# Patient Record
Sex: Female | Born: 1975
Health system: Southern US, Community
[De-identification: ages and names within clinical notes are randomized; demographics above are authoritative.]

## PROBLEM LIST (undated history)

## (undated) DIAGNOSIS — I499 Cardiac arrhythmia, unspecified: Secondary | ICD-10-CM

## (undated) DIAGNOSIS — R51 Headache: Secondary | ICD-10-CM

## (undated) DIAGNOSIS — Z9889 Other specified postprocedural states: Secondary | ICD-10-CM

## (undated) DIAGNOSIS — I10 Essential (primary) hypertension: Secondary | ICD-10-CM

## (undated) DIAGNOSIS — M797 Fibromyalgia: Secondary | ICD-10-CM

## (undated) DIAGNOSIS — J189 Pneumonia, unspecified organism: Secondary | ICD-10-CM

## (undated) DIAGNOSIS — Z8489 Family history of other specified conditions: Secondary | ICD-10-CM

## (undated) DIAGNOSIS — F32A Depression, unspecified: Secondary | ICD-10-CM

## (undated) DIAGNOSIS — G4700 Insomnia, unspecified: Secondary | ICD-10-CM

## (undated) DIAGNOSIS — F419 Anxiety disorder, unspecified: Secondary | ICD-10-CM

## (undated) DIAGNOSIS — K219 Gastro-esophageal reflux disease without esophagitis: Secondary | ICD-10-CM

## (undated) DIAGNOSIS — IMO0001 Reserved for inherently not codable concepts without codable children: Secondary | ICD-10-CM

## (undated) DIAGNOSIS — K589 Irritable bowel syndrome without diarrhea: Secondary | ICD-10-CM

## (undated) DIAGNOSIS — M542 Cervicalgia: Secondary | ICD-10-CM

## (undated) DIAGNOSIS — M7918 Myalgia, other site: Secondary | ICD-10-CM

## (undated) DIAGNOSIS — N83209 Unspecified ovarian cyst, unspecified side: Secondary | ICD-10-CM

## (undated) DIAGNOSIS — T4145XA Adverse effect of unspecified anesthetic, initial encounter: Secondary | ICD-10-CM

## (undated) DIAGNOSIS — J45909 Unspecified asthma, uncomplicated: Secondary | ICD-10-CM

## (undated) DIAGNOSIS — S83241A Other tear of medial meniscus, current injury, right knee, initial encounter: Secondary | ICD-10-CM

## (undated) DIAGNOSIS — N3010 Interstitial cystitis (chronic) without hematuria: Secondary | ICD-10-CM

## (undated) DIAGNOSIS — T8859XA Other complications of anesthesia, initial encounter: Secondary | ICD-10-CM

## (undated) DIAGNOSIS — R112 Nausea with vomiting, unspecified: Secondary | ICD-10-CM

## (undated) DIAGNOSIS — M1990 Unspecified osteoarthritis, unspecified site: Secondary | ICD-10-CM

## (undated) HISTORY — DX: Irritable bowel syndrome without diarrhea: K58.9

## (undated) HISTORY — PX: OVARIAN CYST REMOVAL: SHX89

## (undated) HISTORY — PX: OTHER SURGICAL HISTORY: SHX169

## (undated) HISTORY — DX: Headache: R51

## (undated) HISTORY — DX: Anxiety disorder, unspecified: F41.9

---

## 2001-05-02 HISTORY — PX: PELVIC LAPAROSCOPY: SHX162

## 2002-03-26 ENCOUNTER — Other Ambulatory Visit: Admission: RE | Admit: 2002-03-26 | Discharge: 2002-03-26 | Payer: Self-pay | Admitting: Obstetrics & Gynecology

## 2002-06-10 ENCOUNTER — Ambulatory Visit (HOSPITAL_COMMUNITY): Admission: RE | Admit: 2002-06-10 | Discharge: 2002-06-10 | Payer: Self-pay | Admitting: Obstetrics and Gynecology

## 2002-06-11 ENCOUNTER — Encounter: Payer: Self-pay | Admitting: Obstetrics and Gynecology

## 2002-06-11 ENCOUNTER — Encounter: Admission: RE | Admit: 2002-06-11 | Discharge: 2002-06-11 | Payer: Self-pay | Admitting: Obstetrics and Gynecology

## 2002-06-11 ENCOUNTER — Observation Stay (HOSPITAL_COMMUNITY): Admission: AD | Admit: 2002-06-11 | Discharge: 2002-06-12 | Payer: Self-pay | Admitting: Obstetrics & Gynecology

## 2002-06-12 ENCOUNTER — Encounter: Payer: Self-pay | Admitting: Obstetrics and Gynecology

## 2005-02-18 ENCOUNTER — Inpatient Hospital Stay (HOSPITAL_COMMUNITY): Admission: RE | Admit: 2005-02-18 | Discharge: 2005-02-21 | Payer: Self-pay | Admitting: Obstetrics and Gynecology

## 2006-03-20 ENCOUNTER — Ambulatory Visit: Payer: Self-pay | Admitting: Family Medicine

## 2006-05-08 ENCOUNTER — Ambulatory Visit: Payer: Self-pay | Admitting: Family Medicine

## 2006-05-10 ENCOUNTER — Encounter: Admission: RE | Admit: 2006-05-10 | Discharge: 2006-05-10 | Payer: Self-pay | Admitting: Family Medicine

## 2006-06-05 ENCOUNTER — Ambulatory Visit: Payer: Self-pay | Admitting: Family Medicine

## 2006-06-28 ENCOUNTER — Ambulatory Visit: Payer: Self-pay | Admitting: Family Medicine

## 2006-08-09 ENCOUNTER — Ambulatory Visit: Payer: Self-pay | Admitting: Family Medicine

## 2006-12-18 ENCOUNTER — Encounter (INDEPENDENT_AMBULATORY_CARE_PROVIDER_SITE_OTHER): Payer: Self-pay | Admitting: Family Medicine

## 2006-12-22 ENCOUNTER — Ambulatory Visit: Payer: Self-pay | Admitting: Family Medicine

## 2006-12-22 ENCOUNTER — Telehealth (INDEPENDENT_AMBULATORY_CARE_PROVIDER_SITE_OTHER): Payer: Self-pay | Admitting: *Deleted

## 2006-12-22 LAB — CONVERTED CEMR LAB
Hep B Core Total Ab: NEGATIVE
Hep B S Ab: POSITIVE — AB
Hepatitis B Surface Ag: NEGATIVE

## 2006-12-25 ENCOUNTER — Telehealth (INDEPENDENT_AMBULATORY_CARE_PROVIDER_SITE_OTHER): Payer: Self-pay | Admitting: *Deleted

## 2007-03-02 ENCOUNTER — Telehealth (INDEPENDENT_AMBULATORY_CARE_PROVIDER_SITE_OTHER): Payer: Self-pay | Admitting: *Deleted

## 2007-06-28 ENCOUNTER — Telehealth (INDEPENDENT_AMBULATORY_CARE_PROVIDER_SITE_OTHER): Payer: Self-pay | Admitting: *Deleted

## 2007-06-29 ENCOUNTER — Telehealth (INDEPENDENT_AMBULATORY_CARE_PROVIDER_SITE_OTHER): Payer: Self-pay | Admitting: *Deleted

## 2007-07-06 ENCOUNTER — Ambulatory Visit: Payer: Self-pay | Admitting: Family Medicine

## 2007-07-06 DIAGNOSIS — G4700 Insomnia, unspecified: Secondary | ICD-10-CM | POA: Insufficient documentation

## 2007-07-06 DIAGNOSIS — R51 Headache: Secondary | ICD-10-CM | POA: Insufficient documentation

## 2007-07-06 DIAGNOSIS — F5101 Primary insomnia: Secondary | ICD-10-CM | POA: Insufficient documentation

## 2007-07-06 DIAGNOSIS — R519 Headache, unspecified: Secondary | ICD-10-CM | POA: Insufficient documentation

## 2007-07-06 DIAGNOSIS — F4320 Adjustment disorder, unspecified: Secondary | ICD-10-CM | POA: Insufficient documentation

## 2007-07-06 DIAGNOSIS — F411 Generalized anxiety disorder: Secondary | ICD-10-CM | POA: Insufficient documentation

## 2007-07-30 ENCOUNTER — Telehealth (INDEPENDENT_AMBULATORY_CARE_PROVIDER_SITE_OTHER): Payer: Self-pay | Admitting: *Deleted

## 2007-09-11 ENCOUNTER — Encounter (INDEPENDENT_AMBULATORY_CARE_PROVIDER_SITE_OTHER): Payer: Self-pay | Admitting: *Deleted

## 2007-09-11 ENCOUNTER — Ambulatory Visit: Payer: Self-pay | Admitting: Internal Medicine

## 2007-10-09 ENCOUNTER — Telehealth (INDEPENDENT_AMBULATORY_CARE_PROVIDER_SITE_OTHER): Payer: Self-pay | Admitting: *Deleted

## 2007-10-18 ENCOUNTER — Telehealth (INDEPENDENT_AMBULATORY_CARE_PROVIDER_SITE_OTHER): Payer: Self-pay | Admitting: *Deleted

## 2007-10-26 ENCOUNTER — Ambulatory Visit: Payer: Self-pay | Admitting: Family Medicine

## 2007-12-19 ENCOUNTER — Ambulatory Visit: Payer: Self-pay | Admitting: Family Medicine

## 2008-01-29 ENCOUNTER — Telehealth (INDEPENDENT_AMBULATORY_CARE_PROVIDER_SITE_OTHER): Payer: Self-pay | Admitting: *Deleted

## 2008-02-15 ENCOUNTER — Ambulatory Visit: Payer: Self-pay | Admitting: Family Medicine

## 2008-02-27 ENCOUNTER — Telehealth: Payer: Self-pay | Admitting: Family Medicine

## 2008-03-05 ENCOUNTER — Ambulatory Visit: Payer: Self-pay | Admitting: Family Medicine

## 2008-03-10 ENCOUNTER — Telehealth: Payer: Self-pay | Admitting: Family Medicine

## 2008-03-24 ENCOUNTER — Telehealth: Payer: Self-pay | Admitting: Family Medicine

## 2008-04-28 ENCOUNTER — Telehealth: Payer: Self-pay | Admitting: Family Medicine

## 2008-05-20 ENCOUNTER — Telehealth (INDEPENDENT_AMBULATORY_CARE_PROVIDER_SITE_OTHER): Payer: Self-pay | Admitting: *Deleted

## 2008-06-13 ENCOUNTER — Telehealth (INDEPENDENT_AMBULATORY_CARE_PROVIDER_SITE_OTHER): Payer: Self-pay | Admitting: *Deleted

## 2008-06-26 ENCOUNTER — Ambulatory Visit: Payer: Self-pay | Admitting: Family Medicine

## 2008-06-26 DIAGNOSIS — F329 Major depressive disorder, single episode, unspecified: Secondary | ICD-10-CM

## 2008-06-26 DIAGNOSIS — F32A Depression, unspecified: Secondary | ICD-10-CM | POA: Insufficient documentation

## 2008-07-10 ENCOUNTER — Telehealth: Payer: Self-pay | Admitting: Family Medicine

## 2008-08-02 ENCOUNTER — Ambulatory Visit: Payer: Self-pay | Admitting: Family Medicine

## 2008-08-08 ENCOUNTER — Telehealth: Payer: Self-pay | Admitting: Family Medicine

## 2008-08-22 ENCOUNTER — Ambulatory Visit: Payer: Self-pay | Admitting: Family Medicine

## 2008-08-22 LAB — CONVERTED CEMR LAB
ALT: 14 units/L (ref 0–35)
AST: 15 units/L (ref 0–37)
Albumin: 4.8 g/dL (ref 3.5–5.2)
Alkaline Phosphatase: 46 units/L (ref 39–117)
BUN: 16 mg/dL (ref 6–23)
CO2: 23 meq/L (ref 19–32)
Calcium: 9.5 mg/dL (ref 8.4–10.5)
Chloride: 104 meq/L (ref 96–112)
Cholesterol: 138 mg/dL (ref 0–200)
Creatinine, Ser: 0.87 mg/dL (ref 0.40–1.20)
Glucose, Bld: 89 mg/dL (ref 70–99)
HDL: 53 mg/dL (ref >39)
LDL Cholesterol: 68 mg/dL (ref 0–99)
Potassium: 4.3 meq/L (ref 3.5–5.3)
Sodium: 139 meq/L (ref 135–145)
Total Bilirubin: 0.7 mg/dL (ref 0.3–1.2)
Total CHOL/HDL Ratio: 2.6
Total Protein: 7.5 g/dL (ref 6.0–8.3)
Triglycerides: 85 mg/dL (ref <150)
VLDL: 17 mg/dL (ref 0–40)

## 2008-08-25 ENCOUNTER — Encounter: Payer: Self-pay | Admitting: Family Medicine

## 2008-08-26 ENCOUNTER — Encounter: Payer: Self-pay | Admitting: Family Medicine

## 2008-10-06 ENCOUNTER — Telehealth: Payer: Self-pay | Admitting: Family Medicine

## 2008-12-26 ENCOUNTER — Ambulatory Visit: Payer: Self-pay | Admitting: Family Medicine

## 2009-01-13 ENCOUNTER — Telehealth: Payer: Self-pay | Admitting: Family Medicine

## 2009-01-15 ENCOUNTER — Telehealth (INDEPENDENT_AMBULATORY_CARE_PROVIDER_SITE_OTHER): Payer: Self-pay | Admitting: *Deleted

## 2009-01-21 ENCOUNTER — Telehealth (INDEPENDENT_AMBULATORY_CARE_PROVIDER_SITE_OTHER): Payer: Self-pay | Admitting: *Deleted

## 2009-01-23 ENCOUNTER — Telehealth: Payer: Self-pay | Admitting: Family Medicine

## 2009-01-28 ENCOUNTER — Ambulatory Visit: Payer: Self-pay | Admitting: Family Medicine

## 2009-01-28 LAB — CONVERTED CEMR LAB: Rapid Strep: NEGATIVE

## 2009-02-17 ENCOUNTER — Telehealth: Payer: Self-pay | Admitting: Family Medicine

## 2009-02-26 ENCOUNTER — Ambulatory Visit: Payer: Self-pay | Admitting: Family Medicine

## 2009-03-03 ENCOUNTER — Ambulatory Visit: Payer: Self-pay | Admitting: Family Medicine

## 2009-03-04 ENCOUNTER — Emergency Department (HOSPITAL_COMMUNITY): Admission: EM | Admit: 2009-03-04 | Discharge: 2009-03-04 | Payer: Self-pay | Admitting: Family Medicine

## 2009-03-20 ENCOUNTER — Ambulatory Visit: Payer: Self-pay | Admitting: Family Medicine

## 2009-04-15 ENCOUNTER — Ambulatory Visit: Payer: Self-pay | Admitting: Family Medicine

## 2009-04-23 ENCOUNTER — Telehealth: Payer: Self-pay | Admitting: Family Medicine

## 2009-05-25 ENCOUNTER — Telehealth: Payer: Self-pay | Admitting: Family Medicine

## 2009-07-02 ENCOUNTER — Ambulatory Visit: Payer: Self-pay | Admitting: Family Medicine

## 2009-07-02 DIAGNOSIS — R5381 Other malaise: Secondary | ICD-10-CM | POA: Insufficient documentation

## 2009-07-02 DIAGNOSIS — R5383 Other fatigue: Secondary | ICD-10-CM

## 2009-10-13 ENCOUNTER — Ambulatory Visit: Payer: Self-pay | Admitting: Family Medicine

## 2009-10-13 DIAGNOSIS — J309 Allergic rhinitis, unspecified: Secondary | ICD-10-CM | POA: Insufficient documentation

## 2009-10-30 ENCOUNTER — Telehealth: Payer: Self-pay | Admitting: Family Medicine

## 2009-11-13 ENCOUNTER — Ambulatory Visit (HOSPITAL_BASED_OUTPATIENT_CLINIC_OR_DEPARTMENT_OTHER): Admission: RE | Admit: 2009-11-13 | Discharge: 2009-11-13 | Payer: Self-pay | Admitting: Urology

## 2010-01-26 ENCOUNTER — Telehealth: Payer: Self-pay | Admitting: Family Medicine

## 2010-01-27 ENCOUNTER — Telehealth: Payer: Self-pay | Admitting: Family Medicine

## 2010-02-05 ENCOUNTER — Ambulatory Visit: Payer: Self-pay | Admitting: Family Medicine

## 2010-02-05 DIAGNOSIS — F17200 Nicotine dependence, unspecified, uncomplicated: Secondary | ICD-10-CM | POA: Insufficient documentation

## 2010-02-05 DIAGNOSIS — Z720 Tobacco use: Secondary | ICD-10-CM | POA: Insufficient documentation

## 2010-02-05 DIAGNOSIS — N3010 Interstitial cystitis (chronic) without hematuria: Secondary | ICD-10-CM | POA: Insufficient documentation

## 2010-02-17 ENCOUNTER — Telehealth: Payer: Self-pay | Admitting: Family Medicine

## 2010-02-17 ENCOUNTER — Encounter: Payer: Self-pay | Admitting: Family Medicine

## 2010-03-08 ENCOUNTER — Ambulatory Visit: Payer: Self-pay | Admitting: Family Medicine

## 2010-05-07 ENCOUNTER — Telehealth: Payer: Self-pay | Admitting: Family Medicine

## 2010-05-12 ENCOUNTER — Telehealth: Payer: Self-pay | Admitting: Family Medicine

## 2010-05-24 ENCOUNTER — Ambulatory Visit
Admission: RE | Admit: 2010-05-24 | Discharge: 2010-05-24 | Payer: Self-pay | Source: Home / Self Care | Attending: Family Medicine | Admitting: Family Medicine

## 2010-06-01 NOTE — Letter (Signed)
Summary: Out of Work  [facility]  [address], [address]   [place], [place] [address]   Phone: [phone]  Fax: [phone]    March 08, 2010   Employee:  [name]    To Whom It May Concern:   For Medical reasons, please excuse the above named employee from work for the following dates:  Start:   Nov 7th -8th  End:   Nov 9th  If you need additional information, please feel free to contact our office.         Sincerely,    Seymour Bars DO

## 2010-06-01 NOTE — Assessment & Plan Note (Signed)
Summary: f/u anxiety   Vital Signs:  Patient profile:   35 year old female Height:      63.5 inches Weight:      125 pounds BMI:     21.87 O2 Sat:      98 % on Room air Pulse rate:   86 / minute BP sitting:   105 / 71  (left arm) Cuff size:   regular  Vitals Entered By: Payton Spark CMA (February 05, 2010 1:04 PM)  O2 Flow:  Room air CC: F/U sleep   Primary Care Provider:  Seymour Bars DO  CC:  F/U sleep.  History of Present Illness: 35 yo WF presents for f/u sleep problems from her anxiety.  She lost 8 lbs.  She has been off daytime meds.    For the most part, she is doing OK.  She is going thru some stressors with her ex husband.  She wanted to RF her Chantix.  she quit smoking on the starting month pack 3 wks ago and is doing great.    Clonazepam is helping but she wants to increase to a higher dose.  Still having some problems getting to sleep.   Current Medications (verified): 1)  Maxalt-Mlt 5 Mg  Tbdp (Rizatriptan Benzoate) .... Take 1 Tablet  At Onset and Repeat in 2hrs If Needed. Max 2 Tablet/24hr 2)  Nasonex 50 Mcg/act Susp (Mometasone Furoate) .... 2 Sprays Each Nostril Once Daily 3)  Clonazepam 1 Mg Tabs (Clonazepam) .[name].. 1 Tab By Mouth At Bedtime As Needed Anxiety/ Sleep  Allergies (verified): No Known Drug Allergies  Past History:  Past Medical History: Reviewed history from 08/22/2008 and no changes required. Anxiety Headache IBS?  (per pt) saw Dr Loreta Ave  bursitis/ carpal tunnel - Dr Christell Constant, [facility]  pap - Dr Billy Coast, May  Social History: Reviewed history from 12/26/2008 and no changes required. 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   Head:  normocephalic and atraumatic.   Lungs:  Clear to auscultation bilaterally. Normal work of  breathing.  Heart:  RRR with normal S1 and S2. No murmur, rub, or gallop.  Skin:  color normal.   Psych:  good eye contact, not anxious appearing, and not depressed appearing.     Impression & Recommendations:  Problem # 1:  ANXIETY (ICD-300.00) Improved but will go up to the next higher dose (this is the max).  RX sent him.  Discussed adding SSRI or SNRI or counseling but she declined since she is only sometimes having symptoms.   Her updated medication list for this problem includes:    Clonazepam 2 Mg Tabs (Clonazepam) .[name]... 1 tab by mouth qhs  Problem # 2:  CIGARETTE SMOKER (ICD-305.1) Congratulated her on smoking cessation.  Did well on Chantix starting month pack.  Will increase her to the continuing month pack for months 2-->4.  Call if any problems.   Her updated medication list for this problem includes:    Chantix Continuing Month Pak 1 Mg Tabs (Varenicline tartrate) .[name]... Take as directed  Complete Medication List: 1)  Maxalt-mlt 5 Mg Tbdp (Rizatriptan benzoate) .... Take 1 tablet  at onset and repeat in 2hrs if needed. max 2 tablet/24hr 2)  Nasonex 50 Mcg/act Susp (Mometasone furoate) .... 2 sprays each nostril  once daily 3)  Clonazepam 2 Mg Tabs (Clonazepam) .[name].. 1 tab by mouth qhs 4)  Chantix Continuing Month Pak 1 Mg Tabs (Varenicline tartrate) .... Take as directed 5)  Elmiron 100 Mg Caps (Pentosan polysulfate sodium) .... 2 tabs by mouth bid  Patient Instructions: 1)  Increase Klonopin to 2 mg at bedtime. 2)  Call if daytime anxiety worsens. 3)  Chantix continuing month pack filled. 4)  Keep up the good work with smoking cessation! 5)  Return for f/u mood/ migraines in 4 months. Prescriptions: CLONAZEPAM 2 MG TABS (CLONAZEPAM) 1 tab by mouth qhs  #90 x 0   Entered and Authorized by:   Seymour Bars DO   Signed by:   Seymour Bars DO on 02/05/2010   Method used:   Printed then faxed to ...       [facility] (mail-order)             , [place]         Ph: [phone]        Fax: [phone]   RxID:   [id number] CHANTIX CONTINUING MONTH PAK 1 MG TABS (VARENICLINE TARTRATE) take as directed  #1 pack x 2   Entered and Authorized by:   Seymour Bars DO   Signed by:   Seymour Bars DO on 02/05/2010   Method used:   [facility] to        [facility] [phone]* (retail)       [address] [place], [place]  [address]       Ph: [phone] or [phone]       Fax: [phone]   RxID:   [id number] CLONAZEPAM 2 MG TABS (CLONAZEPAM) 1 tab by mouth qhs  #30 x 0   Entered and Authorized by:   Seymour Bars DO   Signed by:   Seymour Bars DO on 02/05/2010   Method used:   Printed then faxed to ...       [facility]  [place] [phone]* (retail)       [address] [place], [place]  [address]       Ph: [phone] or [phone]       Fax: [phone]   RxID:   [id number]

## 2010-06-01 NOTE — Assessment & Plan Note (Signed)
Summary: sinusitis   Vital Signs:  Patient profile:   35 year old female Height:      63.5 inches Weight:      127 pounds BMI:     22.22 O2 Sat:      97 % on Room air Temp:     97.9 degrees F oral Pulse rate:   88 / minute BP sitting:   108 / 71  (left arm) Cuff size:   regular  Vitals Entered By: Payton Spark CMA (March 08, 2010 2:46 PM)  O2 Flow:  Room air CC: ? Sinus infection x 3 days.   Primary Care Provider:  Seymour Bars DO  CC:  ? Sinus infection x 3 days.[name]  History of Present Illness: 35 yo WF presents for headache, head congestion, runny nose and scratchy throat.  Multiple sick contacts.  Having fatigue and subjective fevers.  Missing work today.  Taking Nyquil at night.  Using Motirn for the headache.  Has frontal sinus pressure.  Denies cough, chest tightness or SOB.    Current Medications (verified): 1)  Maxalt-Mlt 5 Mg  Tbdp (Rizatriptan Benzoate) .... Take 1 Tablet  At Onset and Repeat in 2hrs If Needed. Max 2 Tablet/24hr 2)  Nasonex 50 Mcg/act Susp (Mometasone Furoate) .... 2 Sprays Each Nostril Once Daily 3)  Clonazepam 2 Mg Tabs (Clonazepam) .[name].. 1 Tab By Mouth Qhs 4)  Chantix Continuing Month Pak 1 Mg Tabs (Varenicline Tartrate) .... Take As Directed 5)  Elmiron 100 Mg Caps (Pentosan Polysulfate Sodium) .... 2 Tabs By Mouth Bid 6)  Prochlorperazine Maleate 5 Mg Tabs (Prochlorperazine Maleate) .[name].. 1 Tab By Mouth Q 6 Hrs As Needed Nausea  Allergies (verified): No Known Drug Allergies  Past History:  Past Medical History: Reviewed history from 08/22/2008 and no changes required. Anxiety Headache IBS?  (per pt) saw Dr Loreta Ave  bursitis/ carpal tunnel - Dr Christell Constant, [facility]  pap - Dr Billy Coast, May  Social History: Reviewed history from 12/26/2008 and no changes required. 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   Head:  normocephalic and atraumatic.  frontal sinuses TTP with slight edema over maxillary region Eyes:  conjunctiva clear Ears:  EACs patent; TMs translucent and gray with good cone of light and bony landmarks.  Nose:  nasal congestion with boggy turbinates Mouth:  o/p injected Neck:  enlarged tender submandibular LA Lungs:  Normal respiratory effort, chest expands symmetrically. Lungs are clear to auscultation, no crackles or wheezes. Heart:  Normal rate and regular rhythm. S1 and S2 normal without gallop, murmur, click, rub or other extra sounds. Skin:  color normal.     Impression & Recommendations:  Problem # 1:  ACUTE FRONTAL SINUSITIS (ICD-461.1) Treat with Cefdinir x 10 days, Advil Cold and sinus day time and nyquil at night with use of Nasonex sample x 10 days. Call if not improved after 10 days. Her updated medication list for this problem includes:    Nasonex 50 Mcg/act Susp (Mometasone furoate) .[name]... 2 sprays each nostril once daily    Cefdinir 300 Mg Caps (Cefdinir) .[name]... 1 capsule by mouth two times a day x 10 days    Nasonex 50 Mcg/act Susp (Mometasone furoate) .[name]... 2 sprays per nostril daily  Complete Medication List: 1)  Maxalt-mlt 5 Mg Tbdp (  Rizatriptan benzoate) .... Take 1 tablet  at onset and repeat in 2hrs if needed. max 2 tablet/24hr 2)  Nasonex 50 Mcg/act Susp (Mometasone furoate) .... 2 sprays each nostril once daily 3)  Clonazepam 2 Mg Tabs (Clonazepam) .[name].. 1 tab by mouth qhs 4)  Chantix Continuing Month Pak 1 Mg Tabs (Varenicline tartrate) .... Take as directed 5)  Elmiron 100 Mg Caps (Pentosan polysulfate sodium) .... 2 tabs by mouth bid 6)  Prochlorperazine Maleate 5 Mg Tabs (Prochlorperazine maleate) .[name].. 1 tab by mouth q 6 hrs as needed nausea 7)  Cefdinir 300 Mg Caps (Cefdinir) .[name].. 1 capsule by mouth two times a day x 10 days 8)  Nasonex 50 Mcg/act Susp (Mometasone furoate) .... 2 sprays  per nostril daily  Patient Instructions: 1)  Take 10 days of Omnicef for sinusitis. 2)  Use Advil cold and sinus during the day and Nyquil at night. 3)  Use Nasonex 2 sprays per nostril daily x 10 days (sample given). 4)  Call if not resolved in 10 days. Prescriptions: CEFDINIR 300 MG CAPS (CEFDINIR) 1 capsule by mouth two times a day x 10 days  #20 x 0   Entered and Authorized by:   Seymour Bars DO   Signed by:   Seymour Bars DO on 03/08/2010   Method used:   Electronically to        [facility] [phone]* (retail)       [address] [place], [place]  [address]       Ph: [phone] or [phone]       Fax: [phone]   RxID:   [id number]    Orders Added: 1)  Est. Patient Level III [79024]

## 2010-06-01 NOTE — Progress Notes (Signed)
Summary: Trazodone not helping  Phone Note Call from Patient   Caller: Patient Summary of Call: Pt tried trazodone for a few nights but it is not helping. Pt would like to go back on the seroquel but it is too expensive. Pt would like to know know if plain seroquel comes generic. If so, Pt would like to go on plain seroquel. Please advise.  Initial call taken by: Payton Spark CMA,  May 25, 2009 10:14 AM  Follow-up for Phone Call        seroquel is not generic.  She has room to go up to 150 mg of Trazodone if she wants to try this. Follow-up by: Seymour Bars DO,  May 25, 2009 10:45 AM  Additional Follow-up for Phone Call Additional follow up Details #1::        Pt would like to go back on the seroquel and needs [facility] Additional Follow-up by: Payton Spark CMA,  May 25, 2009 12:08 PM    New/Updated Medications: SEROQUEL 50 MG TABS (QUETIAPINE FUMARATE) 1 tab by mouth q PM Prescriptions: SEROQUEL 50 MG TABS (QUETIAPINE FUMARATE) 1 tab by mouth q PM  #30 x 2   Entered and Authorized by:   Seymour Bars DO   Signed by:   Seymour Bars DO on 05/25/2009   Method used:   Electronically to        [facility] [phone]* (retail)       [address] [place], [place]  [address]       Ph: [phone] or [phone]       Fax: [phone]   RxID:   [id number]   Appended Document: Trazodone not helping Pt aware

## 2010-06-01 NOTE — Assessment & Plan Note (Signed)
Summary: cold-cough///vgj   Vital Signs:  Patient profile:   35 year old female Height:      63.5 inches Weight:      133 pounds BMI:     23.27 O2 Sat:      98 % on Room air Temp:     98.0 degrees F oral Pulse rate:   87 / minute BP sitting:   111 / 76  (left arm) Cuff size:   regular  Vitals Entered By: Payton Spark CMA (October 13, 2009 11:00 AM)  O2 Flow:  Room air CC: Congestion, ST amd HA x 1 week.    Primary Care Provider:  Seymour Bars DO  CC:  Congestion and ST amd HA x 1 week. [name]  History of Present Illness: 35 yo woman here today for sore throat x1 week.  subjective fevers.  + HAs, nasal congestion.  no facial pain or pressure.  no ear pain, + itching.  hx of seasonal allergies as a child.  AM cough productive of sputum.  Current Medications (verified): 1)  Maxalt-Mlt 5 Mg  Tbdp (Rizatriptan Benzoate) .... Take 1 Tablet  At Onset and Repeat in 2hrs If Needed. Max 2 Tablet/24hr 2)  Seroquel 100 Mg Tabs (Quetiapine Fumarate) .[name].. 1 Tab By Mouth Qpm  Allergies (verified): No Known Drug Allergies  Review of Systems      See HPI  Physical Exam  General:  Pleasant well-appearing female in no acute distress. Head:  Normocephalic and atraumatic. no TTP over sinuses Eyes:  no injxn or inflammation Ears:  External ear exam shows no significant lesions or deformities.  Otoscopic examination reveals clear canals, tympanic membranes are intact bilaterally without bulging, retraction, inflammation or discharge. Hearing is grossly normal bilaterally. Nose:  + turbinate edema and congestion Mouth:  + PND Lungs:  Clear to auscultation bilaterally. Normal work of breathing.  Heart:  RRR with normal S1 and S2. No murmur, rub, or gallop.  Cervical Nodes:  No lymphadenopathy noted   Impression & Recommendations:  Problem # 1:  PHARYNGITIS-ACUTE (ICD-462) Assessment New likely due to PND.  given duration of sxs check strep but unlikely given pt's constellation of other sxs.  strep negative.  start nasal steroid spray, sample given.  reviewed supportive care and red flags that should prompt return.  Pt expresses understanding and is in agreement w/ this plan. Orders: Rapid Strep ([address])  Problem # 2:  RHINITIS (ICD-477.9) Assessment: New see above. Her updated medication list for this problem includes:    Nasonex 50 Mcg/act Susp (Mometasone furoate) .[name]... 2 sprays each nostril once daily  Complete Medication List: 1)  Maxalt-mlt 5 Mg Tbdp (Rizatriptan benzoate) .... Take 1 tablet  at onset and repeat in 2hrs if needed. max 2 tablet/24hr 2)  Seroquel 100 Mg Tabs (Quetiapine fumarate) .[name].. 1 tab by mouth qpm 3)  Nasonex 50 Mcg/act Susp (Mometasone furoate) .... 2 sprays each nostril once daily  Patient Instructions: 1)  Use the Nasonex daily as directed to decrease congestion and post nasal drip 2)  Add an OTC Claritin or Zyrtec for allergy symptoms 3)  Drink plenty of fluids 4)  Tylenol/Ibuprofen as needed for pain/fever 5)  Hang in there! Prescriptions: NASONEX 50 MCG/ACT SUSP (MOMETASONE FUROATE) 2 sprays each nostril once daily  #1 x 3   Entered and Authorized by:   Neena Rhymes MD   Signed by:   Neena Rhymes MD on 10/13/2009   Method used:   [facility] to  [facility]  [place] [phone]* (retail)       [address] [place], [place]  [address]       Ph: [phone] or [phone]       Fax: [phone]   RxID:   [id number]

## 2010-06-01 NOTE — Progress Notes (Signed)
Summary: Clonazepam refill  Phone Note Refill Request Message from:  Patient on January 27, 2010 8:24 AM  Refills Requested: Medication #1:  CLONAZEPAM 1 MG TABS 1 tab by mouth at bedtime as needed anxiety/ sleep. Initial call taken by: Payton Spark CMA,  January 27, 2010 8:24 AM    Prescriptions: CLONAZEPAM 1 MG TABS (CLONAZEPAM) 1 tab by mouth at bedtime as needed anxiety/ sleep  #30 x 0   Entered and Authorized by:   Seymour Bars DO   Signed by:   Seymour Bars DO on 01/27/2010   Method used:   Printed then faxed to ...       [facility]  [place] [phone]* (retail)       [address] [place], [place]  [address]       Ph: [phone] or [phone]       Fax: [phone]   RxID:   [id number]

## 2010-06-01 NOTE — Progress Notes (Signed)
Summary: Rx denied?  Phone Note Call from Patient Call back at Work Phone [phone]   Caller: Patient Call For: Seymour Bars DO Summary of Call: Pt called and LM wanting to know why her rx was denied. She has appt next Friday and wants to know if this can be filled. Call her back at work number. Initial call taken by: Kathlene November,  January 26, 2010 10:02 AM  Follow-up for Phone Call        will RF tomorrow. Follow-up by: Seymour Bars DO,  January 26, 2010 10:07 AM     Appended Document: Rx denied? Pt aware of the above

## 2010-06-01 NOTE — Progress Notes (Signed)
Summary: GI virus  Phone Note Call from Patient   Caller: Patient Summary of Call: Pt states she started w/ nausea and diarrhea last night. Pt denies fever and pain. She would like nausea Rx and work note.  Initial call taken by: Payton Spark CMA,  February 17, 2010 9:06 AM  Follow-up for Phone Call        I sent in RX for generic compazine to use for nausea.  Pick up at pharmacy.  Use Immodium as needed for diarrhea.  Clear fluids, diluted gatorade to hydrate.  Out of work note given for today and tomorrow.  Advance to bland diet once feeling better.  Call if not improved after 72 hrs.   Follow-up by: Seymour Bars DO,  February 17, 2010 9:20 AM    New/Updated Medications: PROCHLORPERAZINE MALEATE 5 MG TABS (PROCHLORPERAZINE MALEATE) 1 tab by mouth q 6 hrs as needed nausea Prescriptions: PROCHLORPERAZINE MALEATE 5 MG TABS (PROCHLORPERAZINE MALEATE) 1 tab by mouth q 6 hrs as needed nausea  #15 x 0   Entered and Authorized by:   Seymour Bars DO   Signed by:   Seymour Bars DO on 02/17/2010   Method used:   Electronically to        [facility] [phone]* (retail)       [address]       [place], [place]  [address]       Ph: [phone] or [phone]       Fax: [phone]   RxID:   [id number]   Appended Document: GI virus Pt aware of the above

## 2010-06-01 NOTE — Assessment & Plan Note (Signed)
Summary: f/u sleep   Vital Signs:  Patient profile:   35 year old female Height:      63.5 inches Weight:      132 pounds BMI:     23.10 O2 Sat:      100 % on Room air Pulse rate:   89 / minute BP sitting:   121 / 81  (left arm) Cuff size:   regular  Vitals Entered By: Payton Spark CMA (July 02, 2009 3:34 PM)  O2 Flow:  Room air CC: F/U mood. Would like seroquel increased.    Primary Care Provider:  Seymour Bars DO  CC:  F/U mood. Would like seroquel increased. [name]  History of Present Illness: [name] is a 35 year old female here for follow up of sleep. If she takes one she is not able to fall asleep or stay asleep, but with two she is able to sleep all night. She typically goes to bed at 9:30 and wakes up at 5:30 for work. She feels that it makes her gain weight. She has not been taking her Venlafaxine. During the day she is doing well with her mood and feels happy without anxiety. Trazodone did not work for her; one pill had no effect, and two made her dizzy/clumsy and thinks she may have lost consciousness. No increased grogginess during the day, lightheadedness, or dizziness since she has not been taking the Trazodone.   Current Medications (verified): 1)  Maxalt-Mlt 5 Mg  Tbdp (Rizatriptan Benzoate) .... Take 1 Tablet  At Onset and Repeat in 2hrs If Needed. Max 2 Tablet/24hr 2)  Venlafaxine Hcl 75 Mg Xr24h-Cap (Venlafaxine Hcl) .[name].. 1 Capsule By Mouth Daily 3)  Seroquel 50 Mg Tabs (Quetiapine Fumarate) .[name].. 1 Tab By Mouth Q Pm  Allergies (verified): No Known Drug Allergies  Review of Systems      See HPI  Physical Exam  General:  Pleasant well-appearing female in no acute distress. Head:  Normocephalic and atraumatic.  Eyes:  Sclera clear.  Nose:  No rhinorrhea. Mouth:  Oropharynx clear with no lesions or exudate.  Lungs:  Clear to auscultation bilaterally. Normal work of breathing.  Heart:  RRR with normal S1 and S2. No murmur, rub, or gallop.  Pulses:   Radial pulses 2+ bilaterally. Extremities:  No cyanosis, clubbing, or edema.  Psych:  Pleasant and alert with normal concentration and attention.    Impression & Recommendations:  Problem # 1:  ANXIETY (ICD-300.00) Anxiety with chronic insomnia. Pt has stable mood and is able to sleep through the night on Seroquel. Continue Seroquel 100mg  daily. Will recheck labs in April. Conseled on diet and exercise to counteract SE of weight gain.   The following medications were removed from the medication list:    Alprazolam 1 Mg Tbdp (Alprazolam) .[name]... 1 tab by mouth at bedtime as needed sleep/ anxiety    Venlafaxine Hcl 75 Mg Xr24h-cap (Venlafaxine hcl) .[name]... 1 capsule by mouth daily  Complete Medication List: 1)  Maxalt-mlt 5 Mg Tbdp (Rizatriptan benzoate) .... Take 1 tablet  at onset and repeat in 2hrs if needed. max 2 tablet/24hr 2)  Seroquel 100 Mg Tabs (Quetiapine fumarate) .[name].. 1 tab by mouth qpm  Other Orders: T-CBC No Diff ([id number]) T-Comprehensive Metabolic Panel ([id number]) T-Lipid Profile ([id number])  Patient Instructions: 1)  STay on 100 mg of Seroquel in the evenings. 2)  Update fasting the end of April. 3)  Will call you w/ results. 4)  Work on low carb, healthy diet and  increased exercise.  Prescriptions: SEROQUEL 100 MG TABS (QUETIAPINE FUMARATE) 1 tab by mouth qPM  #30 x 3   Entered and Authorized by:   Michele Bars DO   Signed by:   Michele Bars DO on 07/02/2009   Method used:   [facility]ronically to        [facility] [phone]* (retail)       [address] [place], [place]  [address]       Ph: [phone] or [phone]       Fax: [phone]   RxID:   [id number]

## 2010-06-01 NOTE — Progress Notes (Signed)
Summary: triage call  Phone Note Call from Patient   Summary of Call: pt called and states that she is on Serquel and it is making her really constipated and she has tried stool softners and changing her diet.Marland KitchenMarland KitchenMarland KitchenIf Dr. Cathey Endow does call in something as far as Miralax ( she can not do powder Form).... Pt has tried Fiber pills and nothing has helped, she is going 2-3 days without using the restroom. Call her back at [phone] Initial call taken by: Michaelle Copas,  October 30, 2009 11:31 AM  Follow-up for Phone Call        Our options are to change Seroquel or treat with a bowel regimen while on Seroquel :   Colace twice a day + Miralax OTC once a day.   Follow-up by: Seymour Bars DO,  October 30, 2009 11:50 AM  Additional Follow-up for Phone Call Additional follow up Details #1::        Pt can not take the Miralax and is already doing the Colace. Would like to change the med. [facility] Additional Follow-up by: Kathlene November,  October 30, 2009 12:52 PM    Additional Follow-up for Phone Call Additional follow up Details #2::    wean off seroquel by taking 1/2 tab daily x 1 wk then stop. Once off, start on Clonazepam RX 1 tab at bedtime, not to combine with ETOH.  Schedule f/u appt with me in 3 wks.  Follow-up by: Seymour Bars DO,  October 30, 2009 12:59 PM  New/Updated Medications: SEROQUEL 100 MG TABS (QUETIAPINE FUMARATE) 1/2 tab by mouth daily x 7 days CLONAZEPAM 1 MG TABS (CLONAZEPAM) 1 tab by mouth at bedtime as needed anxiety/ sleep Prescriptions: CLONAZEPAM 1 MG TABS (CLONAZEPAM) 1 tab by mouth at bedtime as needed anxiety/ sleep  #30 x 0   Entered and Authorized by:   Seymour Bars DO   Signed by:   Seymour Bars DO on 10/30/2009   Method used:   Printed then faxed to ...       [facility]  [place] [phone]* (retail)       [address] [place], [place]  [address]       Ph: [phone] or [phone]       Fax: [phone]   RxID:   [id number]

## 2010-06-01 NOTE — Letter (Signed)
Summary: Out of Work  [facility]  [address]   [place], [place] [address]   Phone: [phone]  Fax: [phone]    February 17, 2010   Employee:  [name]    To Whom It May Concern:   For Medical reasons, please excuse the above named employee from work for the following dates:  Start:   Oct 19th - 20th  End:   Oct 21st  If you need additional information, please feel free to contact our office.         Sincerely,    Seymour Bars DO

## 2010-06-03 NOTE — Assessment & Plan Note (Signed)
Summary: STYE ON EYE with surrounding erythema   Vital Signs:  Patient profile:   35 year old female Height:      63.5 inches Weight:      126 pounds Pulse rate:   78 / minute BP sitting:   105 / 68  (right arm) Cuff size:   regular  Vitals Entered By: Avon Gully CMA, Duncan Dull) (May 24, 2010 1:43 PM) CC: rt eye painfull,red and swollen since sat, had a HA on rt side as well  Vision Screening:Left eye with correction: 20 / 25 Right eye with correction: 20 / 25 Both eyes with correction: 20 / 25        Vision Entered By: Avon Gully CMA, Duncan Dull) (May 24, 2010 3:27 PM)   Primary Care Provider:  Seymour Bars DO  CC:  rt eye painfull, red and swollen since sat, and had a HA on rt side as well.  History of Present Illness: rt eye painfull,red and swollen since sat, had a HA on rt side as well.  the headache started 5 days prior to the eye discomfort.Has a stye and has ben doing warm compresses.  Has been burning and vitching. Stye present since 3 days ago.  Then this AM noticed redness around the bottom of the eye. Started using a petroleum jelly/ OTC stye oinmtment over the weekend. she feels her vision has been low but abnormal in her right eye that is not sure if this is just from discomfort.  She does work last this and her lenses are about 78-year-old.  Current Medications (verified): 1)  Maxalt-Mlt 5 Mg  Tbdp (Rizatriptan Benzoate) .... Take 1 Tablet  At Onset and Repeat in 2hrs If Needed. Max 2 Tablet/24hr 2)  Nasonex 50 Mcg/act Susp (Mometasone Furoate) .... 2 Sprays Each Nostril Once Daily 3)  Clonazepam 2 Mg Tabs (Clonazepam) .[name].. 1 Tab By Mouth Qhs 4)  Chantix Continuing Month Pak 1 Mg Tabs (Varenicline Tartrate) .... Take As Directed 5)  Elmiron 100 Mg Caps (Pentosan Polysulfate Sodium) .... 2 Tabs By Mouth Bid  Allergies (verified): No Known Drug Allergies  Comments:  Nurse/Medical Assistant: The patient's medications and allergies were reviewed with  the patient and were updated in the Medication and Allergy Lists. Avon Gully CMA, Duncan Dull) (May 24, 2010 1:44 PM)  Physical Exam  General:  Well-developed,well-nourished,in no acute distress; alert,appropriate and cooperative throughout examination Head:  Normocephalic and atraumatic without obvious abnormalities. No apparent alopecia or balding. Eyes:  No corneal or conjunctival inflammation noted. EOMI. Perrla.Right Upper lid is mildly swollen. Has evidence of a stye on the upper lid and rednessa dn swelling over the lower lid and onto her face.  She does wear glasses. EOMit  Lungs:  Normal respiratory effort, chest expands symmetrically. Lungs are clear to auscultation, no crackles or wheezes. Heart:  Normal rate and regular rhythm. S1 and S2 normal without gallop, murmur, click, rub or other extra sounds.   Impression & Recommendations:  Problem # 1:  STYE (ICD-373.11) Assessment New she certainly has a stye.  The erythema below the eye really looks more like an allergic reaction. she is to stop using the over-the-counter ointment cream. This I did give her an injection of Depo-Medrol today.  Also I'm going to put her on an oral antibiotic just in case the erythema below the eye is infection.  Also I will start her on bacitracin ointment to the eye.  We reviewed how to best to be warm compresses and gentle massage.  If the erythema and stye it are not resolving in the next week to see her office a call and will refer her to ophthalmology.  Certainly if her eye gets worse she is to call immediately and we will try to get her in within 24 hours ophthalmology.  Her vision is normal today. Orders: Depo- Medrol 40mg  (J1030) Admin of Injection (IM/SQ) (78469)  Complete Medication List: 1)  Maxalt-mlt 5 Mg Tbdp (Rizatriptan benzoate) .... Take 1 tablet  at onset and repeat in 2hrs if needed. max 2 tablet/24hr 2)  Nasonex 50 Mcg/act Susp (Mometasone furoate) .... 2 sprays each nostril once  daily 3)  Clonazepam 2 Mg Tabs (Clonazepam) .[name].. 1 tab by mouth qhs 4)  Chantix Continuing Month Pak 1 Mg Tabs (Varenicline tartrate) .... Take as directed 5)  Elmiron 100 Mg Caps (Pentosan polysulfate sodium) .... 2 tabs by mouth bid 6)  Bacitracin 500 Unit/gm Oint (Bacitracin) .... Apply 1/2 inch ribbon to lower eye pocket every 3-4 hours 7)  Augmentin 500-125 Mg Tabs (Amoxicillin-pot clavulanate) .... Take 1 tablet by mouth three times a day for 10 days  Patient Instructions: 1)  Warm compresses: Hot wet towel to the eye with gentle pressure and massage 4-5 x per day for 10 - 15 mintues at a time.  2)  We will do the ointment and the oral antibiotic.  Prescriptions: AUGMENTIN 500-125 MG TABS (AMOXICILLIN-POT CLAVULANATE) Take 1 tablet by mouth three times a day for 10 days  #30 x 0   Entered and Authorized by:   Nani Gasser MD   Signed by:   Nani Gasser MD on 05/24/2010   Method used:   [facility] to        [facility] [phone][facility] (retail)       [address] [place], [place]  [address]       Ph: [phone] or [phone]       Fax: [phone]   RxID:   [id number] BACITRACIN 500 UNIT/GM OINT (BACITRACIN) Apply 1/2 inch ribbon to lower eye pocket every 3-4 hours  #1 tube x 0   Entered and Authorized by:   Nani Gasser MD   Signed by:   Nani Gasser MD on 05/24/2010   Method used:   Electronically to        [facility] [phone]* (retail)       [address] [place], [place]  [address]       Ph: [phone] or [phone]       Fax: [phone]   RxID:   [id number]    Medication Administration  Injection # 1:    Medication: Depo- Medrol 40mg     Diagnosis: STYE (ICD-373.11)    Route: IM    Site: RUOQ gluteus    Exp Date: 10/01/2010    Lot #: [id number]    Mfr: Pharmacia    Patient tolerated injection without complications    Given by: Avon Gully CMA, Duncan Dull) (May 24, 2010 2:38 PM)  Orders Added: 1)  Depo- Medrol  40mg  [J1030] 2)  Admin of Injection (IM/SQ) [03500] 3)  Est. Patient Level IV [93818]

## 2010-06-03 NOTE — Progress Notes (Signed)
Summary: Med refill  Phone Note Refill Request   Refills Requested: Medication #1:  CLONAZEPAM 2 MG TABS 1 tab by mouth qhs   Supply Requested: 3 months   Notes: Send to [facility]  Method Requested: Fax to [facility] Initial call taken by: Kathlene November LPN,  May 07, 2010 11:18 AM    Prescriptions: CLONAZEPAM 2 MG TABS (CLONAZEPAM) 1 tab by mouth qhs  #90 x 0   Entered and Authorized by:   Seymour Bars DO   Signed by:   Seymour Bars DO on 05/07/2010   Method used:   Printed then faxed to ...       [facility] (mail-order)             , [place]         Ph: [phone]       Fax: [phone]   RxID:   [id number]   Appended Document: Med refill Faxed to [facility]

## 2010-06-03 NOTE — Progress Notes (Signed)
Summary: Clonazepam supply  Phone Note Call from Patient   Caller: Patient Summary of Call: Pt states she has not received clonazepam from [facility] yet and she only has enough for 1 more day. Pt requests small supply be sent to local pharm so she doesnt run out. Please advise. Initial call taken by: Payton Spark CMA,  May 12, 2010 9:46 AM    Prescriptions: CLONAZEPAM 2 MG TABS (CLONAZEPAM) 1 tab by mouth qhs  #5 tabs x 0   Entered and Authorized by:   Seymour Bars DO   Signed by:   Seymour Bars DO on 05/12/2010   Method used:   Printed then faxed to ...       [facility]  [place] [phone]* (retail)       [address] [place], [place]  [address]       Ph: [phone] or [phone]       Fax: [phone]   RxID:   [id number]   Appended Document: Clonazepam supply Pt aware of the above.

## 2010-06-16 ENCOUNTER — Encounter: Payer: Self-pay | Admitting: Family Medicine

## 2010-06-21 ENCOUNTER — Encounter: Payer: Self-pay | Admitting: Family Medicine

## 2010-06-21 ENCOUNTER — Ambulatory Visit (INDEPENDENT_AMBULATORY_CARE_PROVIDER_SITE_OTHER): Payer: BC Managed Care – PPO | Admitting: Family Medicine

## 2010-06-21 DIAGNOSIS — M76899 Other specified enthesopathies of unspecified lower limb, excluding foot: Secondary | ICD-10-CM

## 2010-06-22 ENCOUNTER — Telehealth (INDEPENDENT_AMBULATORY_CARE_PROVIDER_SITE_OTHER): Payer: Self-pay | Admitting: *Deleted

## 2010-06-23 NOTE — Letter (Signed)
Summary: Out of Work  [facility]  [address]   [place], [place] [address]   Phone: [phone]  Fax: [phone]    June 16, 2010   Employee:  SENA HOOPINGARNER    To Whom It May Concern:   For Medical reasons, please excuse the above named employee from work for the following dates:  Start:   Feb 15th  End:   Feb 16th  If you need additional information, please feel free to contact our office.         Sincerely,    Seymour Bars DO

## 2010-06-29 NOTE — Progress Notes (Signed)
Summary: [facility]-Hip pain  Phone Note Call from Patient Call back at Home Phone [phone]   Caller: Patient Call For: Seymour Bars DO Summary of Call: Pt states hip pain is no better after injection.  States it hurts to walk on still.  Pt is taking Celebrex and tylenol as directed.  LM to [facility] at home number  Initial call taken by: Francee Piccolo CMA Duncan Dull),  June 22, 2010 10:09 AM  Follow-up for Phone Call        Pt called back again stating she is in pain even when sitting. She has tried the clebrex and tylenol w/out relief. She was also unable to sleep last night due to pain. Please advise. Follow-up by: Payton Spark CMA,  June 22, 2010 11:18 AM  Additional Follow-up for Phone Call Additional follow up Details #1::        Will schedule her with ortho.  We don't usually use narcotics for bursiti.  See if can get her in tomorrow or thrus.  Additional Follow-up by: Nani Gasser MD,  June 22, 2010 11:22 AM

## 2010-06-29 NOTE — Assessment & Plan Note (Signed)
Summary: Trochanteric bursitis   Vital Signs:  Patient profile:   35 year old female Height:      63.5 inches Weight:      125 pounds Pulse rate:   83 / minute BP sitting:   106 / 67  (right arm) Cuff size:   regular  Vitals Entered By: Avon Gully CMA, Duncan Dull) (June 21, 2010 10:38 AM) CC: rt hip pain   Primary Care Provider:  Seymour Bars DO  CC:  rt hip pain.  History of Present Illness: Having pain in the right hip. BOtherer her last montth and then got better and now painful again. Got a systemic steroid shot last month and helped some.  Took IBU and Celebrex for pain. told had bursitis.  Painful sitting, standing or wrose laying on it.  Was radiating into the ankel but now better.  Took a vicodin this AM as well and now the pain in her leg is better but hip is still hurting. .   Current Medications (verified): 1)  Maxalt-Mlt 5 Mg  Tbdp (Rizatriptan Benzoate) .... Take 1 Tablet  At Onset and Repeat in 2hrs If Needed. Max 2 Tablet/24hr 2)  Nasonex 50 Mcg/act Susp (Mometasone Furoate) .... 2 Sprays Each Nostril Once Daily 3)  Clonazepam 2 Mg Tabs (Clonazepam) .[name].. 1 Tab By Mouth Qhs 4)  Elmiron 100 Mg Caps (Pentosan Polysulfate Sodium) .... 2 Tabs By Mouth Bid  Allergies (verified): No Known Drug Allergies  Comments:  Nurse/Medical Assistant: The patient's medications and allergies were reviewed with the patient and were updated in the Medication and Allergy Lists. Avon Gully CMA, Duncan Dull) (June 21, 2010 10:39 AM)  Social History: Reviewed history from 12/26/2008 and no changes required. 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  Strength 5/5, including hip abduction. TEnder over the greater trochanter.   Knee and ankle strength and rom are normal.  Skin:  no rashes.   Psych:  Cognition and judgment appear intact. Alert and cooperative with normal attention span and concentration. No apparent delusions, illusions, hallucinations   Impression & Recommendations:  Problem # 1:  TROCHANTERIC BURSITIS (ICD-726.5)  Discussed dx. Discussed importance of rehabing the area since this is her second episode.   Will give steroid injection in to the bursa for acute pain relief. Ice thei area today.   Also given exercises to rehab the area and if not better in 3 weeks then consider formal PT.   Also give rx for celebrex as well.   Orders: Joint Aspirate / Injection, Large (20610) Depo- Medrol 40mg  (J1030)  Complete Medication List: 1)  Maxalt-mlt 5 Mg Tbdp (Rizatriptan benzoate) .... Take 1 tablet  at onset and repeat in 2hrs if needed. max 2 tablet/24hr 2)  Nasonex 50 Mcg/act Susp (Mometasone furoate) .... 2 sprays each nostril once daily 3)  Clonazepam 2 Mg Tabs (Clonazepam) .[name].. 1 tab by mouth qhs 4)  Elmiron 100 Mg Caps (Pentosan polysulfate sodium) .... 2 tabs by mouth bid 5)  Celebrex 200 Mg Caps (Celecoxib) .... Take 1 tablet by mouth two times a day as needed  Patient Instructions: 1)  Ice the area today for about 10 min at a time.  Start the exercises and stretches on Thursday.  2)  If not getting better in 3 weeks then follow  up with Dr. Cathey Endow. Take the celebrex for inflammation and pain relief.  Prescriptions: CELEBREX 200 MG CAPS (CELECOXIB) Take 1 tablet by mouth two times a day as needed  #60 x 1   Entered and Authorized by:   Nani Gasser MD   Signed by:   Nani Gasser MD on 06/21/2010   Method used:   [facility]        [facility] [phone][facility] (retail)       [address]       [place], [place]  [address]       Ph: [phone] or [phone]       Fax: [phone]   RxID:   [id number]    Orders Added: 1)  Est. Patient Level IV [34742] 2)  Joint  Aspirate / Injection, Large [20610] 3)  Depo- Medrol 40mg  [J1030]

## 2010-07-17 LAB — POCT HEMOGLOBIN-HEMACUE: Hemoglobin: 13.8 g/dL (ref 12.0–15.0)

## 2010-08-04 LAB — HEPATITIS C ANTIBODY: HCV Ab: NEGATIVE

## 2010-08-09 ENCOUNTER — Encounter: Payer: Self-pay | Admitting: Family Medicine

## 2010-08-09 ENCOUNTER — Other Ambulatory Visit: Payer: Self-pay | Admitting: *Deleted

## 2010-08-09 ENCOUNTER — Ambulatory Visit (INDEPENDENT_AMBULATORY_CARE_PROVIDER_SITE_OTHER): Payer: BC Managed Care – PPO | Admitting: Family Medicine

## 2010-08-09 DIAGNOSIS — A94 Unspecified arthropod-borne viral fever: Secondary | ICD-10-CM

## 2010-08-09 DIAGNOSIS — J029 Acute pharyngitis, unspecified: Secondary | ICD-10-CM

## 2010-08-09 DIAGNOSIS — B882 Other arthropod infestations: Secondary | ICD-10-CM

## 2010-08-09 DIAGNOSIS — R110 Nausea: Secondary | ICD-10-CM

## 2010-08-09 MED ORDER — CLONAZEPAM 2 MG PO TABS: 2.0000 mg | ORAL_TABLET | Freq: Every evening | ORAL | Status: DC | PRN

## 2010-08-09 MED ORDER — FLUCONAZOLE 150 MG PO TABS: ORAL_TABLET | ORAL | Status: AC

## 2010-08-09 MED ORDER — DOXYCYCLINE HYCLATE 50 MG PO CAPS: 100.0000 mg | ORAL_CAPSULE | Freq: Two times a day (BID) | ORAL | Status: AC

## 2010-08-09 MED ORDER — PROMETHAZINE HCL 25 MG PO TABS: ORAL_TABLET | ORAL | Status: DC

## 2010-08-09 NOTE — Progress Notes (Signed)
OFFICE VISIT  08/09/2010   CC:  Chief Complaint  Patient presents with  . Rash    began today, does not itch, bit by tick 2-3 weeks ago  . Sore Throat    began today  . Headache    began today, took 800 mg ibuprofen-improved  . Nausea    began today     HPI:    Patient is a 35 y.o. [name] who presents for 24h history of nausea.  Today she also began feeling headachy, has ST, and feels mildly achy and tired all over.  She noted onset of a rash on both wrists today, not itchy or painful.  No known sick contacts. She did pull a tick off of her groin area about 2-3 wks ago, and she's not sure how long it had been there.  Was feeling fine up until yesterday. No known fevers but has been taking ibuprofen.  No vomiting or diarrhea.  No cough or nasal cong/rhinorrhea, no SOB or wheezing.  Past Medical History  Diagnosis Date  . Anxiety   . Headache   . IBS (irritable bowel syndrome)     questionable, per pt; saw Dr. Loreta Ave    Past Surgical History  Procedure Date  . Cesarean section   . Pelvic laparoscopy 2003    endometriosis    Outpatient Prescriptions Prior to Visit  Medication Sig Dispense Refill  . clonazePAM (KLONOPIN) 2 MG tablet Take 1 tablet (2 mg total) by mouth at bedtime as needed.  30 tablet  0  . clonazePAM (KLONOPIN) 2 MG tablet Take 2 mg by mouth at bedtime as needed.          Allergies  Allergen Reactions  . Augmentin (Amoxicillin-Pot Clavulanate) Hives    ROS Review of Systems  Constitutional: Fever: Pt not sure (taking ibuprofen)  HENT: Negative for congestion, sore throat, rhinorrhea, neck pain and neck stiffness.   Eyes: Negative for visual disturbance.  Respiratory: Negative for cough.   Cardiovascular: Negative for chest pain.  Gastrointestinal: Negative for nausea and abdominal pain.  Genitourinary: Negative for dysuria.  Musculoskeletal: Negative for back pain and joint swelling.  Skin: Negative for rash.  Neurological: Negative for  weakness and headaches.  Hematological: Negative for adenopathy.      PE: Blood pressure 110/73, pulse 82, temperature 98.5 F (36.9 C), temperature source Oral, height 5' 3.5" (1.613 m), weight 121 lb (54.885 kg), SpO2 100.00%. Gen: alert, NAD, mildly tired appearing.  Orientedx 4. HEENT: Scalp without lesions or hair loss.  Ears: EACs clear, normal epithelium.  TMs with good light reflex and landmarks bilaterally.  Eyes: no injection, icteris, swelling, or exudate.  EOMI, PERRLA. Nose: no drainage or turbinate edema/swelling.  No injection or focal lesion.  Mouth: lips without lesion/swelling.  Oral mucosa pink and moist.  Dentition intact and without obvious caries or gingival swelling.  Oropharynx without erythema, exudate, or swelling.  Neck: supple, ROM full.  Carotids 2+ bilat, without bruit.  No lymphadenopathy, thyromegaly, or mass. Chest: symmetric expansion, nonlabored respirations.  Clear and equal breath sounds in all lung fields.   CV: RRR, no m/r/g.  Peripheral pulses 2+ and symmetric. ABD: soft, NT, ND, BS normal.  No hepatospenomegaly or mass.  No bruits. EXT: no clubbing, cyanosis, or edema.  SKIN: from antecubital fossae down to base of hands bilat she has a blanching, fine macular rash that is nontender.    LABS: Rapid strep NEGATIVE today.  IMPRESSION AND PLAN:  Tick-borne disease Discussed  empiric tx for RMSF: doxy 100 bid x 7-10d.  Therapeutic expectations and side effect profile of medication discussed today.  Patient's questions answered.  Discussed option of RMSF titers but decided this would not change our acute management.  Phenergan 25mg , 1/2-1 tab q6h prn nausea.  Continue ibuprofen 600mg  q6h prn. Pt. Requested diflucan to have on hand in case of antibiotic induced yeast vaginitis, so I sent rx for this to her pharmacy.     FOLLOW UP: Return if symptoms worsen or fail to improve.

## 2010-08-09 NOTE — Assessment & Plan Note (Addendum)
Discussed empiric tx for RMSF: doxy 100 bid x 7-10d.  Therapeutic expectations and side effect profile of medication discussed today.  Patient's questions answered.  Discussed option of RMSF titers but decided this would not change our acute management.  Phenergan 25mg , 1/2-1 tab q6h prn nausea.  Continue ibuprofen 600mg  q6h prn. Pt. Requested diflucan to have on hand in case of antibiotic induced yeast vaginitis, so I sent rx for this to her pharmacy.

## 2010-08-10 ENCOUNTER — Telehealth: Payer: Self-pay | Admitting: *Deleted

## 2010-08-10 NOTE — Telephone Encounter (Signed)
She came in at the very earliest stage of the illness, and the symptoms she is now describing certainly fit with the dx of RMSF.  I would reassure her that feeling worse today can still be normal but she should BEGIN to feel improved by tomorrow.

## 2010-08-10 NOTE — Telephone Encounter (Signed)
Pt given advice below.  She is agreeable.  She will call if she worsens.

## 2010-08-10 NOTE — Telephone Encounter (Signed)
Pt states she is having increased neck and shoulder pain.  She states the back of her neck from her hairline to shoulders is swollen, puffy and sore.  Also the front of her neck is swollen and sore.  She took two doses of doxy last night.  No fever that she knows of, but she is taking ibuprofen.  Last dose at 2am.  [name] states she feels worse today than she did yesterday.  Her rash is moving up her arms.  Pt would like to know if this normal course for illness.  Please advise.

## 2010-08-12 ENCOUNTER — Ambulatory Visit (INDEPENDENT_AMBULATORY_CARE_PROVIDER_SITE_OTHER): Payer: BC Managed Care – PPO | Admitting: Family Medicine

## 2010-08-12 ENCOUNTER — Encounter: Payer: Self-pay | Admitting: Family Medicine

## 2010-08-12 ENCOUNTER — Telehealth: Payer: Self-pay | Admitting: *Deleted

## 2010-08-12 VITALS — BP 113/75 | HR 80 | Ht 63.5 in | Wt 122.0 lb

## 2010-08-12 DIAGNOSIS — B882 Other arthropod infestations: Secondary | ICD-10-CM

## 2010-08-12 DIAGNOSIS — R112 Nausea with vomiting, unspecified: Secondary | ICD-10-CM

## 2010-08-12 DIAGNOSIS — A94 Unspecified arthropod-borne viral fever: Secondary | ICD-10-CM

## 2010-08-12 LAB — COMPREHENSIVE METABOLIC PANEL
Albumin: 4.1 g/dL (ref 3.5–5.2)
CO2: 26 mEq/L (ref 19–32)
Glucose, Bld: 88 mg/dL (ref 70–99)
Potassium: 4.3 mEq/L (ref 3.5–5.3)
Sodium: 138 mEq/L (ref 135–145)
Total Bilirubin: 0.8 mg/dL (ref 0.3–1.2)
Total Protein: 6.7 g/dL (ref 6.0–8.3)

## 2010-08-12 LAB — CBC WITH DIFFERENTIAL/PLATELET
Eosinophils Absolute: 0.3 10*3/uL (ref 0.0–0.7)
HCT: 41.9 % (ref 36.0–46.0)
Hemoglobin: 14.5 g/dL (ref 12.0–15.0)
Lymphs Abs: 2.2 10*3/uL (ref 0.7–4.0)
MCH: 32.2 pg (ref 26.0–34.0)
Monocytes Relative: 11 % (ref 3–12)
Neutrophils Relative %: 54 % (ref 43–77)
RBC: 4.51 MIL/uL (ref 3.87–5.11)

## 2010-08-12 LAB — LIPASE: Lipase: <10 U/L (ref 0–75)

## 2010-08-12 MED ORDER — OXYCODONE-ACETAMINOPHEN 5-500 MG PO CAPS: ORAL_CAPSULE | ORAL | Status: DC

## 2010-08-12 NOTE — Telephone Encounter (Signed)
Pt has already F/U w/ Dr. Milinda Cave this AM

## 2010-08-12 NOTE — Progress Notes (Signed)
Addended by: Baldemar Lenis on: 08/12/2010 11:35 AM   Modules accepted: Orders

## 2010-08-12 NOTE — Telephone Encounter (Signed)
Pt LMOM stating Dr. Milinda Cave Dx her w/ RMSF last week and was told she should start to improve by now. Pt states she feels worse and is wondering if something else is going on. Please advise.

## 2010-08-12 NOTE — Assessment & Plan Note (Signed)
I still think this and nonspecific viral syndrome are highest working dx for her right now. She is not worsening but has not responded as well to doxy as I would have expected. Will recheck CBC, and will look at a CMET and lipase given her LUQ abd pain and n/v. Continue phenergan prn and I'll add tylox 5/500, 1-2 q6h prn, #30, no RF.

## 2010-08-12 NOTE — Telephone Encounter (Signed)
Since I do not have any openings today and RMSF can be serious, pls see if Dr Milinda Cave or Rogelia Rohrer can see her today.

## 2010-08-12 NOTE — Progress Notes (Signed)
OFFICE VISIT  08/12/2010   CC:  Chief Complaint  Patient presents with  . Back Pain    low back pain  . Fever  . Generalized Body Aches     HPI:    Patient is a 35 y.o. [name] who presents for f/u of recent febrile illness b/c she is not improving.  See note from 08/09/10 for details.  She notes ongoing headache and myalgias (worse in neck and low back) and she had sweats last night for the first time in last few days.  No temp was checked.  She remains on ibuprofen round the clock.  The rash worsened 2d/a on arms and spread to abdomen more, then in the last 24h has almost completely resolved.  No itching.  No cough, no ST.  She hurts in left upper abdominal area on and off and has had pretty persistent nausea.  Had vomiting 2d/a but taking 50mg  phenergan dose helped quell this.  Feels minimal nausea this am.  No diarrhea. +Fatigued.  No dysuria or hematuria.    Past Medical History  Diagnosis Date  . Anxiety   . Headache   . IBS (irritable bowel syndrome)     questionable, per pt; saw Dr. Loreta Ave    Past Surgical History  Procedure Date  . Cesarean section   . Pelvic laparoscopy 2003    endometriosis    Outpatient Prescriptions Prior to Visit  Medication Sig Dispense Refill  . celecoxib (CELEBREX) 200 MG capsule Take 200 mg by mouth 2 (two) times daily as needed.        . clonazePAM (KLONOPIN) 2 MG tablet Take 1 tablet (2 mg total) by mouth at bedtime as needed.  30 tablet  0  . doxycycline (VIBRAMYCIN) 50 MG capsule Take 2 capsules (100 mg total) by mouth 2 (two) times daily.  20 capsule  0  . ibuprofen (ADVIL,MOTRIN) 800 MG tablet Take 800 mg by mouth as needed.        [name] levonorgestrel (MIRENA) 20 MCG/24HR IUD as directed.        . mometasone (NASONEX) 50 MCG/ACT nasal spray 2 sprays each nostril once daily       . pentosan polysulfate (ELMIRON) 100 MG capsule Take 2 tablets by mouth twice daily       . promethazine (PHENERGAN) 25 MG tablet 1/2- 1 tab po q6h prn  nausea  20 tablet  0  . rizatriptan (MAXALT-MLT) 5 MG disintegrating tablet Take 5 mg by mouth as needed. May repeat in 2 hours if needed.  Max 2 tablet/24 hour         Allergies  Allergen Reactions  . Augmentin (Amoxicillin-Pot Clavulanate) Hives    ROS As per HPI  PE: Blood pressure 113/75, pulse 80, height 5' 3.5" (1.613 m), weight 122 lb (55.339 kg). Gen: alert, tired appearing but in NAD.  Oriented x 4.   HEENT: Scalp without lesions or hair loss.  Ears: EACs clear, normal epithelium.  TMs with good light reflex and landmarks bilaterally.  Eyes: no injection, icteris, swelling, or exudate.  EOMI, PERRLA. Nose: no drainage or turbinate edema/swelling.  No injection or focal lesion.  Mouth: lips without lesion/swelling.  Oral mucosa pink and moist.  Dentition intact and without obvious caries or gingival swelling.  Oropharynx without erythema, exudate, or swelling.  Neck: mild bilat ant cervical LAD, minimally tender to palpation in anterior and posterior neck soft tissues.  No neck rigidity.  No thyromegaly. LUNGS: CTA bilat, nonlabored resps.  CV: RRR, no m/r/g ABD: soft, mild/mod LUQ TTP without mass or HSM.  BS hypoactive.  No distention. EXT: no c/c/e SKIN: I see no significant rash anywhere today.  LABS: none here (pt reports having CBC, lyme titers, RMSF titers all done 2 d/a through [facility] by her employer who is a Armed forces operational officer.  IMPRESSION AND PLAN:  Tick-borne disease I still think this and nonspecific viral syndrome are highest working dx for her right now. She is not worsening but has not responded as well to doxy as I would have expected. Will recheck CBC, and will look at a CMET and lipase given her LUQ abd pain and n/v. Continue phenergan prn and I'll add tylox 5/500, 1-2 q6h prn, #30, no RF.  Will obtain results from labs done 2 d/a through [facility].  FOLLOW UP: Return if symptoms worsen or fail to improve.

## 2010-08-13 ENCOUNTER — Telehealth: Payer: Self-pay | Admitting: Family Medicine

## 2010-08-13 NOTE — Telephone Encounter (Signed)
Please see if you can add a monospot to her labs done yesterday.  Same dx as before.  Thx

## 2010-08-17 ENCOUNTER — Encounter: Payer: Self-pay | Admitting: Family Medicine

## 2010-09-01 ENCOUNTER — Telehealth: Payer: Self-pay | Admitting: Family Medicine

## 2010-09-01 NOTE — Telephone Encounter (Signed)
Her daughter is out of school today and tomorrow for illness. Needs work note.

## 2010-09-08 ENCOUNTER — Other Ambulatory Visit: Payer: Self-pay | Admitting: *Deleted

## 2010-09-08 MED ORDER — CLONAZEPAM 2 MG PO TABS: 2.0000 mg | ORAL_TABLET | Freq: Every evening | ORAL | Status: DC | PRN

## 2010-09-09 ENCOUNTER — Telehealth: Payer: Self-pay | Admitting: Family Medicine

## 2010-09-09 MED ORDER — VENLAFAXINE HCL ER 75 MG PO CP24: 75.0000 mg | ORAL_CAPSULE | Freq: Every day | ORAL | Status: DC

## 2010-09-09 NOTE — Telephone Encounter (Signed)
Addended by: Seymour Bars on: 09/09/2010 01:47 PM   Modules accepted: Orders

## 2010-09-09 NOTE — Telephone Encounter (Signed)
Pt wants refill on venlafaxine. Refill sent to pharm and Pt aware

## 2010-09-09 NOTE — Telephone Encounter (Signed)
I RFd her Effexor XR 75 mg daily today.

## 2010-09-09 NOTE — Telephone Encounter (Signed)
Pt called and only left message for triage nurse that she would like to speak with Payton Spark, CMA.  Notified pt with number she left to contact her which was her work #.  Needed a refill of a medication she used to take for anxeity for the daytime hours.  Pt did not the name of the medication, and when I looked not sure what pt is talking about.  Dr. Cathey Endow had told pt to let her know if she decided she needed med.   Plan:  Routed to Payton Spark, CMA and Dr. Cathey Endow

## 2010-09-17 NOTE — Op Note (Signed)
NAMEARMIYAH, CAPRON      ACCOUNT NO.:  [id number]   MEDICAL RECORD NO.:  [id number]          PATIENT TYPE:  INP   LOCATION:  9199                          [facility]ILITY:  [facility]   PHYSICIAN:  Lenoard Aden, M.D.DATE OF BIRTH:  March 12, 1976   DATE OF PROCEDURE:  02/18/2005  DATE OF DISCHARGE:                                 OPERATIVE REPORT   PREOPERATIVE DIAGNOSIS:  Previous cesarean for repeat, 39-week intrauterine  pregnancy.   POSTOPERATIVE DIAGNOSIS:  Previous cesarean for repeat, 39-week intrauterine  pregnancy.   PROCEDURE:  Repeat low segment transverse cesarean section.   SURGEON:  Lenoard Aden, M.D.   ASSISTANT:  Chester Holstein. Earlene Plater, M.D.   ANESTHESIA:  Spinal by Tyrone Apple. Malen Gauze, M.D.   ESTIMATED BLOOD LOSS:  1000 mL.   COMPLICATIONS:  None.   DRAINS:  Foley.   COUNTS:  Correct.   Patient to recovery in good condition.   FINDINGS:  A full-term living female, occiput transverse position.  Apgars of  8 and 9.  Placenta posterior, intact.  Normal tubes, normal ovaries.  Adhesions of the omentum to the posterior uterine wall.  Adhesions of the  bladder flap along the lower uterine segment.   BRIEF OPERATIVE NOTE:  After being apprised of the risks of anesthesia,  infection, bleeding, injury to abdominal organs with need for repair, the  patient was brought to the operating room, where she was administered a  spinal anesthetic without complications and prepped and draped in the usual  sterile fashion, a Foley catheter placed.  After achieving adequate  anesthesia, dilute Marcaine solution placed in the area of previous  Pfannenstiel skin incision.  Incision made with a scalpel, carried down to  the fascia, which was nicked in the midline and opened transversely using  the Mayo scissors.  The rectus muscles were dissected sharply in the  midline, the peritoneum entered sharply, bladder blade placed.  Bladder flap  adhesions are dissected sharply off the lower  uterine segment atraumatically  without difficulty.  The lower uterine segment is exposed, a Kerr  hysterotomy incision made, clear fluid encountered.  Atraumatic delivery of  a full-term living female, handed to the pediatricians in attendance, Apgars  of 8 and 9.  Cord blood collected.  Placenta delivered manually intact,  three-vessel cord noted.  Uterus exteriorized, curetted using a dry lap  pack.  Omental adhesions lysed using electrocautery.  Incision then closed  in two layers using a 0 Monocryl suture, good hemostasis noted.  One  additional suture along the left lateral edge is placed for  hemostasis.  Bladder flap inspected and found to be hemostatic.  At this  time irrigation was accomplished.  Fascia then closed using 0 Monocryl in a  continuous running fashion and the skin closed using staples.  The patient  tolerates the procedure well, is transferred to recovery in good condition.      Lenoard Aden, M.D.  Electronically Signed     RJT/MEDQ  D:  02/18/2005  T:  02/18/2005  Job:  [id number]

## 2010-09-17 NOTE — Discharge Summary (Signed)
[name], [name]      ACCOUNT NO.:  [id number]   MEDICAL RECORD NO.:  [id number]          PATIENT TYPE:  INP   LOCATION:  9141                          [facility]   PHYSICIAN:  Lenoard Aden, M.D.DATE OF BIRTH:  January 23, 1976   DATE OF ADMISSION:  02/18/2005  DATE OF DISCHARGE:  02/21/2005                                 DISCHARGE SUMMARY   The patient underwent uncomplicated repeat cesarean section February 18, 2005. Postoperative course uncomplicated. Tolerated a regular diet well.  Discharged to home day 3.  Tylox, prenatal vitamins and iron given. Follow  up in the office four to six weeks.      Lenoard Aden, M.D.  Electronically Signed     RJT/MEDQ  D:  04/14/2005  T:  04/15/2005  Job:  [id number]

## 2010-09-17 NOTE — H&P (Signed)
   NAME:  [name], [name]                ACCOUNT NO.:  [id number]   MEDICAL RECORD NO.:  [id number]                   PATIENT TYPE:  AMB   LOCATION:  SDC                                  [facility]   PHYSICIAN:  Lenoard Aden, M.D.             DATE OF BIRTH:  06/19/1975   DATE OF ADMISSION:  DATE OF DISCHARGE:                                HISTORY & PHYSICAL   HISTORY OF PRESENT ILLNESS:  The patient is a 35 year old white female, G1,  P1, who has intermittent pelvic pain without dyspareunia or dysmenorrhea.  She denies GI or GU symptoms. Has had a normal pelvic ultrasound. The  patient has intermittently been on Vicoprofen for this pain.   MEDICATIONS:  Birth control pills.   ALLERGIES:  No known drug allergies. Food allergies to eggs, citric acid,  nuts, and chocolate.   PAST SURGICAL HISTORY:  Previous surgery for a C section done previously  with one uncomplicated birth. No other gynecologic history. She has had a  laparoscopy for an ovarian cyst in 1995.   FAMILY HISTORY:  Diabetes, hypertension.   SOCIAL HISTORY:  She is a half a pack a day smoker for four years.   REVIEW OF SYMPTOMS:  Does report intermittent dyspareunia and dysmenorrhea  but no nausea, vomiting, diarrhea. No flank pain, urgency, or frequency.   PHYSICAL EXAMINATION:  GENERAL:  She is a well-developed, well-nourished,  white female in no acute distress.  HEENT:  Normal.  LUNGS:  Clear.  HEART:  Regular rhythm.  ABDOMEN:  Soft, no rebound or guarding noted. Uterus is small, anteflexed.  No adnexal masses are appreciated. Bilateral adnexal tenderness is noted.   IMPRESSION:  Chronic pelvic pain of unknown etiology.   PLAN:  Treat with diagnostic laparoscopy. Risks of anesthesia, infection,  bleeding, intra-abdominal __________, inability to cure pelvic pain  discussed with patient who acknowledges and wishes to proceed.                                               Lenoard Aden, M.D.    RJT/MEDQ  D:  06/10/2002  T:  06/10/2002  Job:  [id number]

## 2010-09-17 NOTE — H&P (Signed)
   NAME:  [name], [name]                ACCOUNT NO.:  [id number]   MEDICAL RECORD NO.:  [id number]                   PATIENT TYPE:  AMB   LOCATION:  SDC                                  [facility]   PHYSICIAN:  Lenoard Aden, M.D.             DATE OF BIRTH:  11/11/1975   DATE OF ADMISSION:  06/10/2002  DATE OF DISCHARGE:                                HISTORY & PHYSICAL   CHIEF COMPLAINT:  Abdominal pain.   HISTORY OF PRESENT ILLNESS:  The patient is a 35 year old white female, G1,  P1 who presents with abdominal pain status post laparoscopy in June 10, 2002. She denies nausea, vomiting, fever or chills. She reports a good  appetite and positive flatus.   PAST MEDICAL HISTORY:  1. Uncomplicated cesarean section.  2. No other medial or surgical hospitalizations outside of pregnancy.  3. History of laparoscopy in 1995.   FAMILY HISTORY:  Diabetes, heart disease, hysterectomy, breast cancer.   REVIEW OF SYMPTOMS:  Otherwise negative.   PHYSICAL EXAMINATION:  GENERAL:  She is an uncomfortable appearing white  female.  HEENT:  Normal.  LUNGS:  Clear.  HEART:  Regular rhythm.  ABDOMEN:  Soft, nondistended, no rebound or guarding. There is evidence of  ecchymosis around the umbilical incision. Other incisions are well healed.  PELVIC:  Reveals a normal size uterus and no adnexal masses.   IMPRESSION:  Acute pelvic pain postoperatively of unknown etiology. The  patient has not taken pain medications, no signs or symptoms of bowel  obstruction, history of normal KUB flat and upright. Hemoglobin of 9.3.   PLAN:  Admit to [facility] for 23 hour observation, serial CBC's,  pelvic abdominal CT scan and will treat pending results.                                               Lenoard Aden, M.D.    RJT/MEDQ  D:  06/11/2002  T:  06/11/2002  Job:  [id number]

## 2010-09-17 NOTE — H&P (Signed)
[name], [name]      ACCOUNT NO.:  [id number]   MEDICAL RECORD NO.:  [id number]          PATIENT TYPE:  INP   LOCATION:  NA                            [facility]   PHYSICIAN:  Lenoard Aden, M.D.DATE OF BIRTH:  1975/09/22   DATE OF ADMISSION:  DATE OF DISCHARGE:                                HISTORY & PHYSICAL   CHIEF COMPLAINT:  Elective repeat cesarean section.   HISTORY OF PRESENT ILLNESS:  A 35 year old white female G2, P7, estimated  date of delivery March 04, 2005, at  38 to 39 weeks for elective repeat C-section.   ALLERGIES:  No known drug allergies.   MEDICATIONS:  Prenatal vitamins.   History of primary C-section for breech in 2000. History of HPV. History of  asthma.   FAMILY HISTORY:  History of chronic obstructive pulmonary disease, heart  disease and insulin-dependent diabetes mellitus.   The patient has a personal history of smoking abuse and migraine headaches.   PRENATAL LAB DATA:  Blood type O positive. Rh antibody negative. Rubella  immune. Hepatitis and HIV nonreactive. GBS is negative.   PHYSICAL EXAMINATION:  GENERAL:  She is a well-developed, well-nourished  white female in no acute distress.  HEENT:  Normal.  LUNGS:  Clear.  HEART:  Regular rhythm.  ABDOMEN:  Soft, gravid, nontender. Estimated fetal weight of about 7-1/2 to  8 pounds.  PELVIC:  Cervix is closed, 3 cm long, vertex and -3.   IMPRESSION:  1.  38-week intrauterine pregnancy.  2.  Previous cesarean section for repeat.   PLAN:  Proceed with elective repeat cesarean section. Risks of anesthesia,  infection, bleeding, injury to abdominal organs, need for repair discussed.  Estimated complications to include bowel and bladder injury noted, patient  acknowledges and wishes to proceed.      Lenoard Aden, M.D.  Electronically Signed     RJT/MEDQ  D:  02/17/2005  T:  02/17/2005  Job:  [id number]

## 2010-09-17 NOTE — Op Note (Signed)
NAME:  [name], [name]                ACCOUNT NO.:  [id number]   MEDICAL RECORD NO.:  [id number]                   PATIENT TYPE:  AMB   LOCATION:  SDC                                  [facility]   PHYSICIAN:  Lenoard Aden, M.D.             DATE OF BIRTH:  Dec 21, 1975   DATE OF PROCEDURE:  06/10/2002  DATE OF DISCHARGE:                                 OPERATIVE REPORT   PREOPERATIVE DIAGNOSIS:  Pelvic pain with dysmenorrhea.   POSTOPERATIVE DIAGNOSIS:  Pelvic adhesions.   PROCEDURE:  Diagnostic laparoscopy and lysis of adhesions.   SURGEON:  Lenoard Aden, M.D.   ANESTHESIA:  General.   ESTIMATED BLOOD LOSS:  Less than 50 mL.   COMPLICATIONS:  None.   DRAINS:  None.   COUNTS:  Correct.   DISPOSITION:  Patient to recovery in good condition.   DESCRIPTION OF PROCEDURE:  After the patient had been apprised of the risks  of anesthesia, infection, bleeding, inability to cure pelvic pain, possible  injury to the abdominal organs and need for repair, the patient was brought  to the operating room, where she was administered a general anesthetic  without complications, prepped and draped in the usual sterile fashion, a  Hulka tenaculum placed per vagina, the bladder catheterized until empty.  After achieving adequate anesthesia, dilute Marcaine solution placed in the  umbilical site, which was then incised with a scalpel, a Veress needle  placed with opening pressure of -2 noted.  Carbon dioxide 3.5 L insufflated  without difficulty, trocar placed atraumatically, pictures taken.  Visualization reveals normal-sized uterus, normal left tube and ovary.  There is a small superficial cystic mass on the right ovary, which is  ablated and appears to be clear and follicular in nature.  The right tube  has a small paratubal cyst that is adherent to the fimbria and not addressed  due to its adherence to the tube.  The two 5 mm trocar sites are then made  in the lower  abdominal area in the left and right lower quadrants of her  previous C-section site.  There are filmy adhesions from the right ovary to  the pelvic sidewall, which are lysed sharply using hook scissors, and  adhesions of the omentum to the anterior lower uterine segment, which are  lysed sharply in an avascular fashion using sharp dissection, and all  bleeding cauterized using Kleppinger bipolar cautery.  Normal posterior cul-  de-sac is noted.  After achieving adequate lysis of adhesions, good  hemostasis is noted, all instruments are removed under direct visualization,  and CO2 released.  Incision at the  umbilicus is closed using a 0 Vicryl and a Dermabond, the two smaller  incisions closed with Dermabond.  The Hulka tenaculum removed from the  vagina.  The patient tolerates the procedure well and is transferred to  recovery in good condition.  Lenoard Aden, M.D.    RJT/MEDQ  D:  06/10/2002  T:  06/10/2002  Job:  [id number]

## 2010-09-29 ENCOUNTER — Encounter: Payer: Self-pay | Admitting: Family Medicine

## 2010-09-29 ENCOUNTER — Ambulatory Visit (INDEPENDENT_AMBULATORY_CARE_PROVIDER_SITE_OTHER): Payer: BC Managed Care – PPO | Admitting: Family Medicine

## 2010-09-29 VITALS — BP 121/81 | HR 84 | Ht 62.0 in | Wt 121.0 lb

## 2010-09-29 DIAGNOSIS — R200 Anesthesia of skin: Secondary | ICD-10-CM

## 2010-09-29 DIAGNOSIS — M76899 Other specified enthesopathies of unspecified lower limb, excluding foot: Secondary | ICD-10-CM

## 2010-09-29 DIAGNOSIS — M7061 Trochanteric bursitis, right hip: Secondary | ICD-10-CM

## 2010-09-29 DIAGNOSIS — R209 Unspecified disturbances of skin sensation: Secondary | ICD-10-CM

## 2010-09-29 MED ORDER — METHYLPREDNISOLONE (PAK) 4 MG PO TABS: 4.0000 mg | ORAL_TABLET | Freq: Every day | ORAL | Status: DC

## 2010-09-29 MED ORDER — ZOLPIDEM TARTRATE 10 MG PO TABS: 10.0000 mg | ORAL_TABLET | Freq: Every evening | ORAL | Status: DC | PRN

## 2010-09-29 NOTE — Assessment & Plan Note (Signed)
R sided, recurrent.  Had very limited improvement from last injection done by Dr Linford Arnold.  Celebrex did help.  She received a Kenalog injection in her buttock 3 wks ago w/o improvement.  No sign of mechanical strain on exam.  At this point, I will change her oral NSAIDs to a Medrol Dose Pack and ask ortho (Dr Rennis Chris) to see her back.

## 2010-09-29 NOTE — Patient Instructions (Signed)
Use Medrol dose pack for both hand numbness/ pain and R hip trochanteric bursitis. Avoid other anti inflammatories while you are on this.  Use Zolpidem at night for sleep. Use a heating pad for neck and hip pain.  Will schedule you back with Dr Rennis Chris for your hip.    Return for f/u mood in 6 wks.

## 2010-09-29 NOTE — Progress Notes (Signed)
  Subjective:    Patient ID: [name], female    DOB: Oct 07, 1975, 35 y.o.   MRN: [id number]  HPI 35 yo WF presents for distal numbness of the R hand digits for the past 2 days.  Denies trauma.  She has had some neck pain from picking up her son.   Her R hip has been hurting again.  She had a bursae injection and then saw ortho for this.  celebrex helped and her pain resolved but then it came back.  Never has L hip pain.  Denies knee, ankle, foot or low back. Pain.  No change in activity level.  No limp.  BP 121/81  Pulse 84  Ht 5\' 2"  (1.575 m)  Wt 121 lb (54.885 kg)  BMI 22.13 kg/m2  SpO2 98%    Review of Systems as her HPI     Objective:   Physical Exam MSK:  Full RUE active ROM and full active C spine ROM.  R hip with neg FABER test.  Able to fully internally rotate but has slight limitation with full external hip rotation and is tender directly over the greater trochanter.  Gait is normal. Neuro: grip + 5/5 bilat, grossly normal R hand digit with intact sensation to touch.        Assessment & Plan:  R hand numbness- subjective entire hand numbness and pain.  This could be from a cervical radiculopathy vs a more distal neuropathy.  No hx of this.  Will see if the Medrol Dose Pack relieves her symptoms.  If not, will get a C spine xray and schedule her for a nerve conduction study.

## 2010-10-01 ENCOUNTER — Telehealth: Payer: Self-pay | Admitting: Family Medicine

## 2010-10-01 NOTE — Telephone Encounter (Signed)
I faxed patients referral form to [facility] and they will let us know what has been scheduled. Thanks

## 2010-10-01 NOTE — Telephone Encounter (Signed)
Pt called and wants to know if her referral appt has been made.  To go to ortho in [place]. Plan:  Routed to [facility]/referrals Jarvis Newcomer, LPN Domingo Dimes

## 2010-10-01 NOTE — Telephone Encounter (Signed)
Pt informed that her referral is in the process, and we are waiting on the referral office to call back with information. Jarvis Newcomer, LPN Domingo Dimes

## 2010-11-04 ENCOUNTER — Encounter (INDEPENDENT_AMBULATORY_CARE_PROVIDER_SITE_OTHER): Payer: Self-pay | Admitting: Emergency Medicine

## 2010-11-04 ENCOUNTER — Inpatient Hospital Stay (INDEPENDENT_AMBULATORY_CARE_PROVIDER_SITE_OTHER)
Admission: RE | Admit: 2010-11-04 | Discharge: 2010-11-04 | Disposition: A | Discharge status: Home / Self Care | Payer: BC Managed Care – PPO | Source: Ambulatory Visit | Attending: Emergency Medicine | Admitting: Emergency Medicine

## 2010-11-04 ENCOUNTER — Telehealth: Payer: Self-pay | Admitting: Family Medicine

## 2010-11-04 ENCOUNTER — Encounter: Payer: Self-pay | Admitting: Emergency Medicine

## 2010-11-04 DIAGNOSIS — R358 Other polyuria: Secondary | ICD-10-CM | POA: Insufficient documentation

## 2010-11-04 DIAGNOSIS — R3589 Other polyuria: Secondary | ICD-10-CM

## 2010-11-04 DIAGNOSIS — R5381 Other malaise: Secondary | ICD-10-CM

## 2010-11-04 DIAGNOSIS — R5383 Other fatigue: Secondary | ICD-10-CM

## 2010-11-04 LAB — CONVERTED CEMR LAB
Bilirubin Urine: NEGATIVE
Glucose, Urine, Semiquant: NEGATIVE
Ketones, urine, test strip: NEGATIVE
Nitrite: NEGATIVE
Protein, U semiquant: NEGATIVE
Specific Gravity, Urine: 1.01
Urobilinogen, UA: 0.2
WBC Urine, dipstick: NEGATIVE
pH: 7

## 2010-11-04 NOTE — Telephone Encounter (Signed)
Pt called back and reported that she is not taking the seroquel because it is too expensive and it caused weight gain for her so she stopped.  Not sure when because she didn't leave that info on my voice mail. Plan:  Routed to Dr. Arlice Colt, LPN Dom[facility] Dimes

## 2010-11-04 NOTE — Telephone Encounter (Signed)
Is she off seroquel?

## 2010-11-04 NOTE — Telephone Encounter (Signed)
Pt called and stated that the Remus Loffler is making her nauseted.  Would like to go back to using the klonopin. Please advise. Tried to review the chart to see if there was a reason for discontinuing the klonopin originally other than pt request.  Plan:  Routed to Dr. Cathey Endow

## 2010-11-04 NOTE — Telephone Encounter (Signed)
LMOM for the patient to call at her home number since she is not at work today.  Need to know if taking seroquel or not. Jarvis Newcomer, LPN Domingo Dimes

## 2010-11-05 MED ORDER — CLONAZEPAM 1 MG PO TABS: 1.0000 mg | ORAL_TABLET | Freq: Every evening | ORAL | Status: DC | PRN

## 2010-11-05 NOTE — Telephone Encounter (Signed)
Klonopin sent into her pharmacy.  Take at bedtime prn sleep.

## 2010-11-05 NOTE — Telephone Encounter (Signed)
Pt informed that the klonopin was sent to the pharm for her. Jarvis Newcomer, LPN Domingo Dimes

## 2010-11-08 ENCOUNTER — Telehealth: Payer: Self-pay | Admitting: Family Medicine

## 2010-11-08 ENCOUNTER — Telehealth (INDEPENDENT_AMBULATORY_CARE_PROVIDER_SITE_OTHER): Payer: Self-pay | Admitting: *Deleted

## 2010-11-08 NOTE — Telephone Encounter (Signed)
Pt called and LMOM for triage stating her klonopin prescription had been sent as 1mg  and it is suppose to be 2 mg.   Plan:  Reviewed the pt medication list and it is listed as 1 mg.  Today when looking at the last office visit for anxiety I see you listed it in your note as klonopin 2 mg, but the med list not updated so how would you like me to fix this prescription that was last given??  Please advise. Plan:  Routed to Dr. Cathey Endow.

## 2010-11-09 ENCOUNTER — Other Ambulatory Visit: Payer: Self-pay | Admitting: Family Medicine

## 2010-11-09 MED ORDER — CLONAZEPAM 2 MG PO TABS: 2.0000 mg | ORAL_TABLET | Freq: Every evening | ORAL | Status: DC | PRN

## 2010-11-09 NOTE — Telephone Encounter (Signed)
1 mg is the highest dose I write for to stay on long term. She is free to see a psychiatrist if she is looking for higher dosing.

## 2010-11-09 NOTE — Telephone Encounter (Signed)
Pt notified that Dr. Cathey Endow will only write for 1 mg long term. I she feels she needs stronger than that we'll need to refer to psychiatrist to eval to do this.  Pt said Dr. Cathey Endow was prescribing the 2 mg before and they just decided to try the [place] and it didn't work.  So this is why she is requesting to have the 2 mg.  Pt was instructed what the provider had ordered and recommended,but she said Dr. Cathey Endow must have her confused with someone else because Dr. Cathey Endow was originally writing the 2mg .  Pt is seeing someone ?counselor in [place] she stated.  Please advise. Jarvis Newcomer, LPN Domingo Dimes

## 2010-11-09 NOTE — Telephone Encounter (Signed)
Pt notified that Dr. Cathey Endow was going to allow her to stay on the Klonopin 2 mg for now, as long as she continues to to see a counselor for this.  Pt agreed.  Pt had already picked up the script for the klonopin 1 mg when it was prescribed on 11-05-10.  Pt said she would keep that script and double up on the pills to make 2 mg a day because she had already opened and used some.  Called the pharmacist at [facility] and told her not to release the new 2mg  klonopin script til 11-20-10. Plan:  ROuted to Dr. Cathey Endow for informational purposes. Jarvis Newcomer, LPN Domingo Dimes

## 2010-11-09 NOTE — Telephone Encounter (Signed)
I will allow her to stay on the 2 mg dose if she is willing to continue counseling.  I would normally refer to psychiatry downstairs but she is not accepting new pts, so for now, I will change her to the 2 mg dose.  Darl Pikes, pls call the pharmacy and have them VOID the Klonopin 1 mg dose.  Thanks.

## 2010-11-23 ENCOUNTER — Ambulatory Visit: Payer: BC Managed Care – PPO | Admitting: Family Medicine

## 2010-12-15 ENCOUNTER — Encounter: Payer: Self-pay | Admitting: Family Medicine

## 2010-12-20 ENCOUNTER — Ambulatory Visit: Payer: BC Managed Care – PPO | Admitting: Family Medicine

## 2011-01-25 ENCOUNTER — Other Ambulatory Visit: Payer: Self-pay | Admitting: Family Medicine

## 2011-01-25 ENCOUNTER — Telehealth: Payer: Self-pay | Admitting: Family Medicine

## 2011-01-25 MED ORDER — CLONAZEPAM 2 MG PO TABS: 2.0000 mg | ORAL_TABLET | Freq: Every evening | ORAL | Status: DC | PRN

## 2011-01-25 NOTE — Telephone Encounter (Signed)
Closed

## 2011-01-25 NOTE — Telephone Encounter (Signed)
Request made for refill of 90 day supply of lisinopril 10mg . Plan:  Too early for refill.  Pharm notified via fax Jarvis Newcomer, LPN Domingo Dimes

## 2011-03-07 ENCOUNTER — Other Ambulatory Visit: Payer: Self-pay | Admitting: *Deleted

## 2011-03-07 MED ORDER — CLONAZEPAM 2 MG PO TABS: 2.0000 mg | ORAL_TABLET | Freq: Every evening | ORAL | Status: DC | PRN

## 2011-04-04 NOTE — Letter (Signed)
Summary: Out of Work  [facility]  [address]   [place], [place] [address]   Phone: [phone]  Fax: [phone]    November 04, 2010   Employee:  [name]    To Whom It May Concern:   For Medical reasons, please excuse the above named employee from work for the following dates:  Start:   November 04, 2010  End:   November 04, 2010  If you need additional information, please feel free to contact our office.         Sincerely,    Lavell Islam RN

## 2011-04-04 NOTE — Telephone Encounter (Signed)
  Phone Note Outgoing Call Call back at [facility] Phone [phone]   Call placed by: Lajean Saver RN,  November 08, 2010 2:19 PM Call placed to: Patient Action Taken: Phone Call Completed Summary of Call: Callback: Patient reports her symptoms are a lot better. All normal labs given to patient.

## 2011-04-04 NOTE — Progress Notes (Signed)
Summary: dizzy,nausea,frequent urination (room 5)   Vital Signs:  Patient Profile:   35 Years Old Female CC:      nausea/dizzy/shakey/dry mouth since 7-2; last nite frequent urination began Height:     63.5 inches Weight:      117.75 pounds O2 Sat:      97 % O2 treatment:    Room Air Temp:     98.9 degrees F oral Pulse rate:   94 / minute Resp:     16 per minute BP sitting:   118 / 78  (left arm) Cuff size:   regular  Vitals Entered By: Lavell Islam RN (November 04, 2010 2:12 PM)                  Updated Prior Medication List: MAXALT-MLT 5 MG  TBDP (RIZATRIPTAN BENZOATE) take 1 tablet  at onset and repeat in 2hrs if needed. Max 2 tablet/24hr CLONAZEPAM 2 MG TABS (CLONAZEPAM) 1 tab by mouth qhs ELMIRON 100 MG CAPS (PENTOSAN POLYSULFATE SODIUM) 2 tabs by mouth bid CELEBREX 200 MG CAPS (CELECOXIB) Take 1 tablet by mouth two times a day as needed * VENAFALXIN 75 MG every a.m.  Current Allergies: No known allergies History of Present Illness History from: patient & spouse Chief Complaint: nausea/dizzy/shakey/dry mouth since 7-2; last nite frequent urination began History of Present Illness: Nausea, dizziness, shaky, fatigue, dry mouth for the past 3-4 days.  Then with frequent urination last night.  She has interstitial cystitis but is not taking her meds as directed.  Also w/ possible RMSF a few months ago and wanted repeat titers.  Also w/ a FHx of hyperglycemia.  Also with recent IUD removal and long recent period that lasted 9 days.  She would like blood work done to check on these things.  No F/C.  Not taking any meds the last few days.  She hasn't been eating much either per her husband, but he also states that it baseline for her.  REVIEW OF SYSTEMS Constitutional Symptoms      Denies fever, chills, night sweats, weight loss, weight gain, and fatigue.  Eyes       Denies change in vision, eye pain, eye discharge, glasses, contact lenses, and eye  surgery. Ear/Nose/Throat/Mouth       Denies hearing loss/aids, change in hearing, ear pain, ear discharge, dizziness, frequent runny nose, frequent nose bleeds, sinus problems, sore throat, hoarseness, and tooth pain or bleeding.  Respiratory       Denies dry cough, productive cough, wheezing, shortness of breath, asthma, bronchitis, and emphysema/COPD.  Cardiovascular       Denies murmurs, chest pain, and tires easily with exhertion.    Gastrointestinal       Complains of nausea/vomiting.      Denies stomach pain, diarrhea, constipation, blood in bowel movements, and indigestion. Genitourniary       Denies painful urination, kidney stones, and loss of urinary control.      Comments: frequency Neurological       Complains of headaches, numbness, and tingling.      Denies paralysis, seizures, and fainting/blackouts. Musculoskeletal       Denies muscle pain, joint pain, joint stiffness, decreased range of motion, redness, swelling, muscle weakness, and gout.  Skin       Denies bruising, unusual mles/lumps or sores, and hair/skin or nail changes.  Psych       Denies mood changes, temper/anger issues, anxiety/stress, speech problems, depression, and sleep problems. 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  (per pt) saw Dr Loreta Ave  bursitis/ carpal tunnel - Dr Christell Constant, [facility] chronic bladder irritation (IC) pap - Dr Billy Coast, May  Family History: Reviewed  history and no changes required. Family History of CAD Female 1st degree relative <50 Family History of Thromboembolism clotting disorder Hypoglycemia- Mother  Social History: Reviewed history from 12/26/2008 and no changes required.  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 denies ETOH Physical Exam General appearance: well developed, well nourished, no acute distress Ears: normal, no lesions or deformities Nasal: mucosa pink, nonedematous, no septal deviation, turbinates normal Oral/Pharynx: tongue normal, posterior pharynx without erythema or exudate Chest/Lungs: no rales, wheezes, or rhonchi bilateral, breath sounds equal without effort Heart: regular rate and  rhythm, no murmur Abdomen: soft, mildly tender central abd without obvious organomegaly, no CVA tenderness, no RLQ pain, no guarding, no rebound MSE: oriented to time, place, and person Assessment New Problems: POLYURIA (ICD-788.42) FAMILY HISTORY OF CAD FEMALE 1ST DEGREE RELATIVE <50 (ICD-V17.3)   Plan New Medications/Changes: PROCHLORPERAZINE MALEATE 10 MG TABS (PROCHLORPERAZINE MALEATE) 1 by mouth two times a day as needed for nausea  #14 x 0, 11/04/2010, Hoyt Koch MD  New Orders: T-Culture, Urine [[id number]] UA Dipstick w/o Micro (automated)  [81003] T-Comprehensive Metabolic Panel [[id number]] T-CBC w/Diff [09811-91478] T-TSH [29562-13086] T- * Misc. Laboratory test [99999] T-Vitamin B12 [82607-23330] T-Iron [57846-96295] T-Ferritin [82728-23350] T-Folic Acid; RBC [28413-24401] Est. Patient Level IV [02725] Planning Comments:   Will treat her nausea with compazine per her request.  Also to draw labs (CBC, CMP, TSH, RMSF, Vit B12, Folate, Iron studies).  UA appears normal, UCx is pending.  Most of her symptoms are mild and vague and she doesn't appear to be in any distress.  Likely viral gastroenteritis, but will see if the labs show anything.  I have  encouraged a bland diet and hydration.  She should also be following her other doctors' instructions (PCP and urology).  If she doesn't agree with the med recommendations or frequencies, she should speak to them about it.  I don't have access to their records, so I don't know what they told her.  If this becomes a recurrent or chronic issue, I'd like her to f/u with Dr. Cathey Endow, her PCP.   The patient and/or caregiver has been counseled thoroughly with regard to medications prescribed including dosage, schedule, interactions, rationale for use, and possible side effects and they verbalize understanding.  Diagnoses and expected course of recovery discussed and will return if not improved as expected or if the condition worsens. Patient and/or caregiver verbalized understanding.  Prescriptions: PROCHLORPERAZINE MALEATE 10 MG TABS (PROCHLORPERAZINE MALEATE) 1 by mouth two times a day as needed for nausea  #14 x 0   Entered and Authorized by:   Hoyt Koch MD   Signed by:   Hoyt Koch MD on 11/04/2010   Method used:   Print then Give to Patient   RxID:   [id number]   Orders Added: 1)  T-Culture, Urine [[id number]]  2)  UA Dipstick w/o Micro (automated)  [81003] 3)  T-Comprehensive Metabolic Panel [[id number]] 4)  T-CBC w/Diff [54098-11914] 5)  T-TSH [78295-62130] 6)  T- * Misc. Laboratory test [99999] 7)  T-Vitamin B12 [82607-23330] 8)  T-Iron [[id number]] 9)  T-Ferritin [82728-23350] 10)  T-Folic Acid; RBC [82747-23360] 11)  Est. Patient Level IV [95284]    Laboratory Results   Urine Tests  Date/Time Received: November 04, 2010 2:06 PM  Date/Time Reported: November 04, 2010 2:06 PM   Routine Urinalysis   Color: yellow Appearance: Clear Glucose: negative   (Normal Range: Negative) Bilirubin: negative   (Normal Range: Negative) Ketone: negative   (Normal Range: Negative) Spec. Gravity: 1.010   (Normal Range: 1.003-1.035) Blood: trace-lysed   (Normal Range:  Negative) pH: 7.0   (Normal Range: 5.0-8.0) Protein: negative   (Normal Range: Negative) Urobilinogen: 0.2   (Normal Range: 0-1) Nitrite: negative   (Normal Range: Negative) Leukocyte Esterace: negative   (Normal Range: Negative)

## 2011-04-05 ENCOUNTER — Emergency Department
Admission: EM | Admit: 2011-04-05 | Discharge: 2011-04-05 | Disposition: A | Discharge status: Home / Self Care | Payer: BC Managed Care – PPO | Source: Home / Self Care

## 2011-04-05 ENCOUNTER — Encounter: Payer: Self-pay | Admitting: *Deleted

## 2011-04-05 DIAGNOSIS — J069 Acute upper respiratory infection, unspecified: Secondary | ICD-10-CM

## 2011-04-05 DIAGNOSIS — M940 Chondrocostal junction syndrome [Tietze]: Secondary | ICD-10-CM

## 2011-04-05 HISTORY — DX: Interstitial cystitis (chronic) without hematuria: N30.10

## 2011-04-05 HISTORY — DX: Insomnia, unspecified: G47.00

## 2011-04-05 LAB — POCT INFLUENZA A/B
Influenza A, POC: NEGATIVE
Influenza B, POC: NEGATIVE

## 2011-04-05 MED ORDER — AZITHROMYCIN 250 MG PO TABS: ORAL_TABLET | ORAL | Status: AC

## 2011-04-05 MED ORDER — PREDNISONE 10 MG PO TABS: ORAL_TABLET | ORAL | Status: DC

## 2011-04-05 MED ORDER — BENZONATATE 200 MG PO CAPS: 200.0000 mg | ORAL_CAPSULE | Freq: Every day | ORAL | Status: AC

## 2011-04-05 MED ORDER — ALBUTEROL SULFATE HFA 108 (90 BASE) MCG/ACT IN AERS: 2.0000 | INHALATION_SPRAY | RESPIRATORY_TRACT | Status: DC | PRN

## 2011-04-05 NOTE — ED Provider Notes (Signed)
History     CSN: [id number] Arrival date & time: 04/05/2011  9:38 AM   None     Chief Complaint  Patient presents with  . Headache  . Cough  . Generalized Body Aches  . Fever     HPI Comments: HPI : Flu symptoms for about 1 day. Fever with chills, sweats, myalgias, fatigue, headache. Symptoms are progressively worsening, despite trying OTC fever reducing medicine and rest and fluids. Has decreased appetite, but tolerating some liquids by mouth.  She had a flu shot two months ago.  Review of Systems: Positive for fatigue, mild nasal congestion, mild sore throat, mild swollen anterior neck glands, mild cough. Negative for acute vision changes, stiff neck, focal weakness, syncope, seizures, respiratory distress, vomiting, diarrhea, GU symptoms.     Past Medical History  Diagnosis Date  . Anxiety   . Headache   . IBS (irritable bowel syndrome)     questionable, per pt; saw Dr. Loreta Ave  . IC (interstitial cystitis)   . Insomnia     Past Surgical History  Procedure Date  . Cesarean section   . Pelvic laparoscopy 2003    endometriosis  . Ovarian cyst removal     Family History  Problem Relation Age of Onset  . Heart attack Father     History  Substance Use Topics  . Smoking status: Current Everyday Smoker -- 0.3 packs/day    Types: Cigarettes  . Smokeless tobacco: Never Used  . Alcohol Use: Yes     drinks- 4 q wk/ smokes 2 cigs per day- quiting 04/05/11    OB History    Grav Para Term Preterm Abortions TAB SAB Ect Mult Living                  Review of Systems  Allergies  Augmentin and Eggs or egg-derived products  Home Medications   Current Outpatient Rx  Name Route Sig Dispense Refill  . ALBUTEROL SULFATE HFA 108 (90 BASE) MCG/ACT IN AERS Inhalation Inhale 2 puffs into the lungs every 4 (four) hours as needed for wheezing. 1 Inhaler 0  . AZITHROMYCIN 250 MG PO TABS  Take 2 tabs today; then begin one tab once daily for 4 more days. (Rx void after  04/13/11)   DEA # [id number] each 0  . BENZONATATE 200 MG PO CAPS Oral Take 1 capsule (200 mg total) by mouth at bedtime. Take as needed for cough 12 capsule 0  . CELECOXIB 200 MG PO CAPS Oral Take 200 mg by mouth 2 (two) times daily as needed.      [name] CLONAZEPAM 2 MG PO TABS Oral Take 1 tablet (2 mg total) by mouth at bedtime as needed. 30 tablet 0  . IBUPROFEN 800 MG PO TABS Oral Take 800 mg by mouth as needed.      [name] LEVONORGESTREL 20 MCG/24HR IU IUD  as directed.      . METHYLPREDNISOLONE (PAK) 4 MG PO TABS Oral Take 1 tablet (4 mg total) by mouth daily. follow package directions 21 tablet 0  . MOMETASONE FUROATE 50 MCG/ACT NA SUSP  2 sprays each nostril once daily     . OXYCODONE-ACETAMINOPHEN 5-500 MG PO CAPS  1-2 tabs q6h prn pain 30 capsule 0  . PENTOSAN POLYSULFATE SODIUM 100 MG PO CAPS  Take 2 tablets by mouth twice daily     . PREDNISONE 10 MG PO TABS  Take 2 tabs twice daily for two days, then one tab twice daily  for 2 days, then 1 daily for two days.  Take PC 14 tablet 0  . PROMETHAZINE HCL 25 MG PO TABS  1/2- 1 tab po q6h prn nausea 20 tablet 0  . RIZATRIPTAN BENZOATE 5 MG PO TBDP Oral Take 5 mg by mouth as needed. May repeat in 2 hours if needed.  Max 2 tablet/24 hour     . VENLAFAXINE HCL 75 MG PO CP24 Oral Take 1 capsule (75 mg total) by mouth daily. 30 capsule 6    BP 121/71  Pulse 113  Temp(Src) 100 F (37.8 C) (Oral)  Resp 18  Ht 5\' 3"  (1.6 m)  Wt 128 lb (58.06 kg)  BMI 22.67 kg/m2  SpO2 100%  LMP 03/06/2011  Physical Exam Nursing notes and Vital Signs reviewed. Appearance:  Patient appears healthy, stated age, and in no acute distress  Eyes:  Pupils are equal, round, and reactive to light and accomodation.  Extraocular movement is intact.  Conjunctivae are not inflamed  Ears:  Canals normal.  Tympanic membranes normal.  Nose:  Mildly congested turbinates.  No sinus tenderness.   Pharynx:  Normal  Neck:  Supple.  Slightly tender shotty posterior nodes are  palpated bilaterally  Lungs:  Few scattered wheezes present; no rales.  Breath sounds are equal.  Chest:  Distinct tenderness to palpation over the mid-sternum.  Heart:  Regular rate and rhythm without murmurs, rubs, or gallops.  Abdomen:  Nontender without masses or hepatosplenomegaly.  Bowel sounds are present.  No CVA or flank tenderness.  Extremities:  No edema or tenderness Skin:  No rash present.  ED Course  Procedures  none   Labs Reviewed  POCT INFLUENZA A/B negative      1. Acute upper respiratory infections of unspecified site   2. Costochondritis, acute       MDM  There is no evidence of bacterial infection today.   Begin tapering course of prednisone, albuterol inhaler prn. Treat symptomatically for now:  Increase fluid intake, begin expectorant/decongestant, topical decongestant, saline nasal spray/saline irrigation, cough suppressant at bedtime. If fever/chills persist, or if not improving 5 days begin Z-pack (given Rx to hold).  Followup with PCP if not improving 7 to 10 days.          Donna Christen, MD 04/06/11 [phone]

## 2011-04-05 NOTE — ED Notes (Signed)
Pt c/o HA, cough, boady aches, and fever x 1 day. She had a flu shot 2 mths ago.

## 2011-04-06 ENCOUNTER — Telehealth: Payer: Self-pay | Admitting: *Deleted

## 2011-04-06 NOTE — Telephone Encounter (Signed)
Pt was seen at [facility] yesterday for high fever, chills, bodyaches, cough- flu test negative, chest xray showed no pneumonia. Treated for bronchitis with prednisone, cough med and inhaler and none of this is working. She feels terrible can not get fever down its 103.0 and using motrin. Please advise

## 2011-04-06 NOTE — Telephone Encounter (Signed)
Pt notified. KJ LPN 

## 2011-04-06 NOTE — Telephone Encounter (Signed)
Alternatate between IBUprofen 800mg  and Tyelnol 500mg  every 4 hours to get fever down. If not working then let me know.

## 2011-04-07 ENCOUNTER — Encounter: Payer: Self-pay | Admitting: Family Medicine

## 2011-04-07 ENCOUNTER — Ambulatory Visit (INDEPENDENT_AMBULATORY_CARE_PROVIDER_SITE_OTHER): Payer: BC Managed Care – PPO | Admitting: Family Medicine

## 2011-04-07 VITALS — BP 116/77 | HR 98 | Temp 98.4°F | Ht 63.0 in | Wt 125.0 lb

## 2011-04-07 DIAGNOSIS — F411 Generalized anxiety disorder: Secondary | ICD-10-CM

## 2011-04-07 DIAGNOSIS — R05 Cough: Secondary | ICD-10-CM

## 2011-04-07 DIAGNOSIS — R059 Cough, unspecified: Secondary | ICD-10-CM

## 2011-04-07 DIAGNOSIS — F419 Anxiety disorder, unspecified: Secondary | ICD-10-CM

## 2011-04-07 DIAGNOSIS — Z716 Tobacco abuse counseling: Secondary | ICD-10-CM

## 2011-04-07 DIAGNOSIS — Z7189 Other specified counseling: Secondary | ICD-10-CM

## 2011-04-07 DIAGNOSIS — Z23 Encounter for immunization: Secondary | ICD-10-CM

## 2011-04-07 DIAGNOSIS — J111 Influenza due to unidentified influenza virus with other respiratory manifestations: Secondary | ICD-10-CM

## 2011-04-07 MED ORDER — CLONAZEPAM 2 MG PO TABS: 2.0000 mg | ORAL_TABLET | Freq: Every evening | ORAL | Status: DC | PRN

## 2011-04-07 MED ORDER — MELOXICAM 7.5 MG PO TABS: 7.5000 mg | ORAL_TABLET | Freq: Every day | ORAL | Status: DC

## 2011-04-07 MED ORDER — HYDROCOD POLST-CHLORPHEN POLST 10-8 MG/5ML PO LQCR: 5.0000 mL | Freq: Two times a day (BID) | ORAL | Status: DC | PRN

## 2011-04-07 MED ORDER — OSELTAMIVIR PHOSPHATE 75 MG PO CAPS: 75.0000 mg | ORAL_CAPSULE | Freq: Two times a day (BID) | ORAL | Status: AC

## 2011-04-07 MED ORDER — RIZATRIPTAN BENZOATE 5 MG PO TBDP: 5.0000 mg | ORAL_TABLET | ORAL | Status: DC | PRN

## 2011-04-07 NOTE — Progress Notes (Signed)
  Subjective:    Patient ID: [name], female    DOB: Sep 25, 1975, 35 y.o.   MRN: [id number]  Influenza This is a new problem. The current episode started in the past 7 days. The problem occurs constantly. The problem has been gradually worsening. Associated symptoms include anorexia, arthralgias, chills, congestion, coughing, diaphoresis, fatigue, a fever, headaches, myalgias, nausea, neck pain, swollen glands and weakness. Pertinent negatives include no abdominal pain, change in bowel habit, chest pain, joint swelling, numbness, rash, sore throat, urinary symptoms, vertigo, visual change or vomiting. The symptoms are aggravated by smoking. She has tried rest for the symptoms. The treatment provided no relief.   #2 TOBACCO ABUSE #3 need klonopin renewal #4 immunization update nedetetnus out of date   Review of Systems  Constitutional: Positive for fever, chills, diaphoresis and fatigue.  HENT: Positive for congestion and neck pain. Negative for sore throat.   Respiratory: Positive for cough.   Cardiovascular: Negative for chest pain.  Gastrointestinal: Positive for nausea and anorexia. Negative for vomiting, abdominal pain and change in bowel habit.  Musculoskeletal: Positive for myalgias and arthralgias. Negative for joint swelling.  Skin: Negative for rash.  Neurological: Positive for weakness and headaches. Negative for vertigo and numbness.   BP 116/77  Pulse 98  Temp(Src) 98.4 F (36.9 C) (Oral)  Ht 5\' 3"  (1.6 m)  Wt 125 lb (56.7 kg)  BMI 22.14 kg/m2  SpO2 98%  LMP 03/06/2011     Objective:   Physical Exam  Nursing note and vitals reviewed. Constitutional: She is oriented to person, place, and time. She appears well-developed and well-nourished. No distress.  HENT:  Head: Normocephalic and atraumatic.  Right Ear: Tympanic membrane normal.  Left Ear: Tympanic membrane normal.  Nose: Nose normal.  Mouth/Throat: Oropharynx is clear and moist. No oropharyngeal  exudate.  Eyes: Right eye exhibits no discharge. Left eye exhibits no discharge. No scleral icterus.  Neck: Normal range of motion. Neck supple.  Cardiovascular: Normal rate, regular rhythm and normal heart sounds.   Pulmonary/Chest: Effort normal. No respiratory distress. She has no wheezes. She has no rhonchi. She has no rales.  Lymphadenopathy:    She has no cervical adenopathy.  Neurological: She is alert and oriented to person, place, and time.  Skin: Skin is warm and dry.  Psychiatric: She has a normal mood and affect. Her behavior is normal.          Assessment & Plan:  #1  Agree w/patient this appears more flu like than bronchitis and a negative flu does not mandate not treating for the flu and since it is within the time frame will place on Tamiflu, Mobic and Tussionex. Work note for the rest of the week also given. #2 smoking cessation counciling done #3 anxiety renewal of klonopin #4 Tetnus immunization given

## 2011-04-07 NOTE — Patient Instructions (Signed)
Influenza Facts Flu (influenza) is a contagious respiratory illness caused by the influenza viruses. It can cause mild to severe illness. While most healthy people recover from the flu without specific treatment and without complications, older people, young children, and people with certain health conditions are at higher risk for serious complications from the flu, including death. CAUSES   The flu virus is spread from person to person by respiratory droplets from coughing and sneezing.   A person can also become infected by touching an object or surface with a virus on it and then touching their mouth, eye or nose.   Adults may be able to infect others from 1 day before symptoms occur and up to 7 days after getting sick. So it is possible to give someone the flu even before you know you are sick and continue to infect others while you are sick.  SYMPTOMS   Fever (usually high).   Headache.   Tiredness (can be extreme).   Cough.   Sore throat.   Runny or stuffy nose.   Body aches.   Diarrhea and vomiting may also occur, particularly in children.   These symptoms are referred to as "flu-like symptoms". A lot of different illnesses, including the common cold, can have similar symptoms.  DIAGNOSIS   There are tests that can determine if you have the flu as long you are tested within the first 2 or 3 days of illness.   A doctor's exam and additional tests may be needed to identify if you have a disease that is a complicating the flu.  RISKS AND COMPLICATIONS  Some of the complications caused by the flu include:  Bacterial pneumonia or progressive pneumonia caused by the flu virus.   Loss of body fluids (dehydration).   Worsening of chronic medical conditions, such as heart failure, asthma, or diabetes.   Sinus problems and ear infections.  HOME CARE INSTRUCTIONS   Seek medical care early on.   If you are at high risk from complications of the flu, consult your health-care  provider as soon as you develop flu-like symptoms. Those at high risk for complications include:   People 65 years or older.   People with chronic medical conditions, including diabetes.   Pregnant women.   Young children.   Your caregiver may recommend use of an antiviral medication to help treat the flu.   If you get the flu, get plenty of rest, drink a lot of liquids, and avoid using alcohol and tobacco.   You can take over-the-counter medications to relieve the symptoms of the flu if your caregiver approves. (Never give aspirin to children or teenagers who have flu-like symptoms, particularly fever).  PREVENTION  The single best way to prevent the flu is to get a flu vaccine each fall. Other measures that can help protect against the flu are:  Antiviral Medications   A number of antiviral drugs are approved for use in preventing the flu. These are prescription medications, and a doctor should be consulted before they are used.   Habits for Good Health   Cover your nose and mouth with a tissue when you cough or sneeze, throw the tissue away after you use it.   Wash your hands often with soap and water, especially after you cough or sneeze. If you are not near water, use an alcohol-based hand cleaner.   Avoid people who are sick.   If you get the flu, stay home from work or school. Avoid contact with   other people so that you do not make them sick, too.   Try not to touch your eyes, nose, or mouth as germs ore often spread this way.  IN CHILDREN, EMERGENCY WARNING SIGNS THAT NEED URGENT MEDICAL ATTENTION:  Fast breathing or trouble breathing.   Bluish skin color.   Not drinking enough fluids.   Not waking up or not interacting.   Being so irritable that the child does not want to be held.   Flu-like symptoms improve but then return with fever and worse cough.   Fever with a rash.  IN ADULTS, EMERGENCY WARNING SIGNS THAT NEED URGENT MEDICAL ATTENTION:  Difficulty  breathing or shortness of breath.   Pain or pressure in the chest or abdomen.   Sudden dizziness.   Confusion.   Severe or persistent vomiting.  SEEK IMMEDIATE MEDICAL CARE IF:  You or someone you know is experiencing any of the symptoms above. When you arrive at the emergency center,report that you think you have the flu. You may be asked to wear a mask and/or sit in a secluded area to protect others from getting sick. MAKE SURE YOU:   Understand these instructions.   Monitor your condition.   Seek medical care if you are getting worse, or not improving.  Document Released: 04/21/2003 Document Revised: 12/29/2010 Document Reviewed: 01/15/2009 [employer] Patient Information 2012 [employer]. 

## 2011-05-16 ENCOUNTER — Other Ambulatory Visit: Payer: Self-pay | Admitting: *Deleted

## 2011-05-16 MED ORDER — CLONAZEPAM 2 MG PO TABS: 2.0000 mg | ORAL_TABLET | Freq: Every evening | ORAL | Status: DC | PRN

## 2011-06-17 ENCOUNTER — Other Ambulatory Visit: Payer: Self-pay | Admitting: *Deleted

## 2011-06-17 MED ORDER — CLONAZEPAM 2 MG PO TABS: 2.0000 mg | ORAL_TABLET | Freq: Every evening | ORAL | Status: DC | PRN

## 2011-07-05 ENCOUNTER — Ambulatory Visit (INDEPENDENT_AMBULATORY_CARE_PROVIDER_SITE_OTHER): Payer: BC Managed Care – PPO | Admitting: Family Medicine

## 2011-07-05 ENCOUNTER — Encounter: Payer: Self-pay | Admitting: Family Medicine

## 2011-07-05 VITALS — BP 113/77 | HR 97 | Temp 99.4°F | Ht 61.0 in | Wt 129.0 lb

## 2011-07-05 DIAGNOSIS — Z87891 Personal history of nicotine dependence: Secondary | ICD-10-CM

## 2011-07-05 DIAGNOSIS — J209 Acute bronchitis, unspecified: Secondary | ICD-10-CM

## 2011-07-05 DIAGNOSIS — Z716 Tobacco abuse counseling: Secondary | ICD-10-CM

## 2011-07-05 DIAGNOSIS — F17200 Nicotine dependence, unspecified, uncomplicated: Secondary | ICD-10-CM

## 2011-07-05 DIAGNOSIS — M62838 Other muscle spasm: Secondary | ICD-10-CM

## 2011-07-05 MED ORDER — VARENICLINE TARTRATE 1 MG PO TABS: 1.0000 mg | ORAL_TABLET | Freq: Two times a day (BID) | ORAL | Status: DC

## 2011-07-05 MED ORDER — BUDESONIDE-FORMOTEROL FUMARATE 80-4.5 MCG/ACT IN AERO: 2.0000 | INHALATION_SPRAY | Freq: Two times a day (BID) | RESPIRATORY_TRACT | Status: DC

## 2011-07-05 MED ORDER — VARENICLINE TARTRATE 0.5 MG PO TABS: 0.5000 mg | ORAL_TABLET | Freq: Two times a day (BID) | ORAL | Status: DC

## 2011-07-05 MED ORDER — ORPHENADRINE CITRATE ER 100 MG PO TB12: 100.0000 mg | ORAL_TABLET | Freq: Two times a day (BID) | ORAL | Status: AC

## 2011-07-05 MED ORDER — ALBUTEROL SULFATE HFA 108 (90 BASE) MCG/ACT IN AERS: 2.0000 | INHALATION_SPRAY | RESPIRATORY_TRACT | Status: DC | PRN

## 2011-07-05 MED ORDER — HYDROCOD POLST-CHLORPHEN POLST 10-8 MG/5ML PO LQCR: 5.0000 mL | Freq: Two times a day (BID) | ORAL | Status: DC | PRN

## 2011-07-05 MED ORDER — AZITHROMYCIN 250 MG PO TABS: ORAL_TABLET | ORAL | Status: AC

## 2011-07-05 MED ORDER — ONDANSETRON 8 MG PO TBDP: 8.0000 mg | ORAL_TABLET | Freq: Three times a day (TID) | ORAL | Status: AC | PRN

## 2011-07-05 NOTE — Patient Instructions (Signed)
Smoking Cessation, Tips for Success YOU CAN QUIT SMOKING If you are ready to quit smoking, congratulations! You have chosen to help yourself be healthier. Cigarettes bring nicotine, tar, carbon monoxide, and other irritants into your body. Your lungs, heart, and blood vessels will be able to work better without these poisons. There are many different ways to quit smoking. Nicotine gum, nicotine patches, a nicotine inhaler, or nicotine nasal spray can help with physical craving. Hypnosis, support groups, and medicines help break the habit of smoking. Here are some tips to help you quit for good.  Throw away all cigarettes.   Clean and remove all ashtrays from your home, work, and car.   On a card, write down your reasons for quitting. Carry the card with you and read it when you get the urge to smoke.   Cleanse your body of nicotine. Drink enough water and fluids to keep your urine clear or pale yellow. Do this after quitting to flush the nicotine from your body.   Learn to predict your moods. Do not let a bad situation be your excuse to have a cigarette. Some situations in your life might tempt you into wanting a cigarette.   Never have "just one" cigarette. It leads to wanting another and another. Remind yourself of your decision to quit.   Change habits associated with smoking. If you smoked while driving or when feeling stressed, try other activities to replace smoking. Stand up when drinking your coffee. Brush your teeth after eating. Sit in a different chair when you read the paper. Avoid alcohol while trying to quit, and try to drink fewer caffeinated beverages. Alcohol and caffeine may urge you to smoke.   Avoid foods and drinks that can trigger a desire to smoke, such as sugary or spicy foods and alcohol.   Ask people who smoke not to smoke around you.   Have something planned to do right after eating or having a cup of coffee. Take a walk or exercise to perk you up. This will help to  keep you from overeating.   Try a relaxation exercise to calm you down and decrease your stress. Remember, you may be tense and nervous for the first 2 weeks after you quit, but this will pass.   Find new activities to keep your hands busy. Play with a pen, coin, or rubber band. Doodle or draw things on paper.   Brush your teeth right after eating. This will help cut down on the craving for the taste of tobacco after meals. You can try mouthwash, too.   Use oral substitutes, such as lemon drops, carrots, a cinnamon stick, or chewing gum, in place of cigarettes. Keep them handy so they are available when you have the urge to smoke.   When you have the urge to smoke, try deep breathing.   Designate your home as a nonsmoking area.   If you are a heavy smoker, ask your caregiver about a prescription for nicotine chewing gum. It can ease your withdrawal from nicotine.   Reward yourself. Set aside the cigarette money you save and buy yourself something nice.   Look for support from others. Join a support group or smoking cessation program. Ask someone at home or at work to help you with your plan to quit smoking.   Always ask yourself, "Do I need this cigarette or is this just a reflex?" Tell yourself, "Today, I choose not to smoke," or "I do not want to smoke." You are  reminding yourself of your decision to quit, even if you do smoke a cigarette.  HOW WILL I FEEL WHEN I QUIT SMOKING?  The benefits of not smoking start within days of quitting.   You may have symptoms of withdrawal because your body is used to nicotine (the addictive substance in cigarettes). You may crave cigarettes, be irritable, feel very hungry, cough often, get headaches, or have difficulty concentrating.   The withdrawal symptoms are only temporary. They are strongest when you first quit but will go away within 10 to 14 days.   When withdrawal symptoms occur, stay in control. Think about your reasons for quitting. Remind  yourself that these are signs that your body is healing and getting used to being without cigarettes.   Remember that withdrawal symptoms are easier to treat than the major diseases that smoking can cause.   Even after the withdrawal is over, expect periodic urges to smoke. However, these cravings are generally short-lived and will go away whether you smoke or not. Do not smoke!   If you relapse and smoke again, do not lose hope. Most smokers quit 3 times before they are successful.   If you relapse, do not give up! Plan ahead and think about what you will do the next time you get the urge to smoke.  LIFE AS A NONSMOKER: MAKE IT FOR A MONTH, MAKE IT FOR LIFE Day 1: Hang this page where you will see it every day. Day 2: Get rid of all ashtrays, matches, and lighters. Day 3: Drink water. Breathe deeply between sips. Day 4: Avoid places with smoke-filled air, such as bars, clubs, or the smoking section of restaurants. Day 5: Keep track of how much money you save by not smoking. Day 6: Avoid boredom. Keep a good book with you or go to the movies. Day 7: Reward yourself! One week without smoking! Day 8: Make a dental appointment to get your teeth cleaned. Day 9: Decide how you will turn down a cigarette before it is offered to you. Day 10: Review your reasons for quitting. Day 11: Distract yourself. Stay active to keep your mind off smoking and to relieve tension. Take a walk, exercise, read a book, do a crossword puzzle, or try a new hobby. Day 12: Exercise. Get off the bus before your stop or use stairs instead of escalators. Day 13: Call on friends for support and encouragement. Day 14: Reward yourself! Two weeks without smoking! Day 15: Practice deep breathing exercises. Day 16: Bet a friend that you can stay a nonsmoker. Day 17: Ask to sit in nonsmoking sections of restaurants. Day 18: Hang up "No Smoking" signs. Day 19: Think of yourself as a nonsmoker. Day 20: Each morning, tell  yourself you will not smoke. Day 21: Reward yourself! Three weeks without smoking! Day 22: Think of smoking in negative ways. Remember how it stains your teeth, gives you bad breath, and leaves you short of breath. Day 23: Eat a nutritious breakfast. Day 24:Do not relive your days as a smoker. Day 25: Hold a pencil in your hand when talking on the telephone. Day 26: Tell all your friends you do not smoke. Day 27: Think about how much better food tastes. Day 28: Remember, one cigarette is one too many. Day 29: Take up a hobby that will keep your hands busy. Day 30: Congratulations! One month without smoking! Give yourself a big reward. Your caregiver can direct you to community resources or hospitals for support, which  may include:  Group support.   Education.   Hypnosis.   Subliminal therapy.  Document Released: 01/15/2004 Document Revised: 04/07/2011 Document Reviewed: 02/02/2009 [facility] Patient Information 2012 [place], [place].Smoking Cessation This document explains the best ways for you to quit smoking and new treatments to help. It lists new medicines that can double or triple your chances of quitting and quitting for good. It also considers ways to avoid relapses and concerns you may have about quitting, including weight gain. NICOTINE: A POWERFUL ADDICTION If you have tried to quit smoking, you know how hard it can be. It is hard because nicotine is a very addictive drug. For some people, it can be as addictive as heroin or cocaine. Usually, people make 2 or 3 tries, or more, before finally being able to quit. Each time you try to quit, you can learn about what helps and what hurts. Quitting takes hard work and a lot of effort, but you can quit smoking. QUITTING SMOKING IS ONE OF THE MOST IMPORTANT THINGS YOU WILL EVER DO.  You will live longer, feel better, and live better.   The impact on your body of quitting smoking is felt almost immediately:   Within 20 minutes, blood  pressure decreases. Pulse returns to its normal level.   After 8 hours, carbon monoxide levels in the blood return to normal. Oxygen level increases.   After 24 hours, chance of heart attack starts to decrease. Breath, hair, and body stop smelling like smoke.   After 48 hours, damaged nerve endings begin to recover. Sense of taste and smell improve.   After 72 hours, the body is virtually free of nicotine. Bronchial tubes relax and breathing becomes easier.   After 2 to 12 weeks, lungs can hold more air. Exercise becomes easier and circulation improves.   Quitting will reduce your risk of having a heart attack, stroke, cancer, or lung disease:   After 1 year, the risk of coronary heart disease is cut in half.   After 5 years, the risk of stroke falls to the same as a nonsmoker.   After 10 years, the risk of lung cancer is cut in half and the risk of other cancers decreases significantly.   After 15 years, the risk of coronary heart disease drops, usually to the level of a nonsmoker.   If you are pregnant, quitting smoking will improve your chances of having a healthy baby.   The people you live with, especially your children, will be healthier.   You will have extra money to spend on things other than cigarettes.  FIVE KEYS TO QUITTING Studies have shown that these 5 steps will help you quit smoking and quit for good. You have the best chances of quitting if you use them together: 1. Get ready.  2. Get support and encouragement.  3. Learn new skills and behaviors.  4. Get medicine to reduce your nicotine addiction and use it correctly.  5. Be prepared for relapse or difficult situations. Be determined to continue trying to quit, even if you do not succeed at first.  1. GET READY  Set a quit date.   Change your environment.   Get rid of ALL cigarettes, ashtrays, matches, and lighters in your home, car, and place of work.   Do not let people smoke in your home.   Review your  past attempts to quit. Think about what worked and what did not.   Once you quit, do not smoke. NOT EVEN A PUFF!  2. GET SUPPORT AND ENCOURAGEMENT Studies have shown that you have a better chance of being successful if you have help. You can get support in many ways.  Tell your family, friends, and coworkers that you are going to quit and need their support. Ask them not to smoke around you.   Talk to your caregivers (doctor, dentist, nurse, pharmacist, psychologist, and/or smoking counselor).   Get individual, group, or telephone counseling and support. The more counseling you have, the better your chances are of quitting. Programs are available at [employer] and health centers. Call your local health department for information about programs in your area.   Spiritual beliefs and practices may help some smokers quit.   Quit meters are Photographer that keep track of quit statistics, such as amount of "quit-time," cigarettes not smoked, and money saved.   Many smokers find one or more of the many self-help books available useful in helping them quit and stay off tobacco.  3. LEARN NEW SKILLS AND BEHAVIORS  Try to distract yourself from urges to smoke. Talk to someone, go for a walk, or occupy your time with a task.   When you first try to quit, change your routine. Take a different route to work. Drink tea instead of coffee. Eat breakfast in a different place.   Do something to reduce your stress. Take a hot bath, exercise, or read a book.   Plan something enjoyable to do every day. Reward yourself for not smoking.   Explore interactive web-based programs that specialize in helping you quit.  4. GET MEDICINE AND USE IT CORRECTLY Medicines can help you stop smoking and decrease the urge to smoke. Combining medicine with the above behavioral methods and support can quadruple your chances of successfully quitting smoking. The U.[place]. Food and Drug  Administration (FDA) has approved 7 medicines to help you quit smoking. These medicines fall into 3 categories.  Nicotine replacement therapy (delivers nicotine to your body without the negative effects and risks of smoking):   Nicotine gum: Available over-the-counter.   Nicotine lozenges: Available over-the-counter.   Nicotine inhaler: Available by prescription.   Nicotine nasal spray: Available by prescription.   Nicotine skin patches (transdermal): Available by prescription and over-the-counter.   Antidepressant medicine (helps people abstain from smoking, but how this works is unknown):   Bupropion sustained-release (SR) tablets: Available by prescription.   Nicotinic receptor partial agonist (simulates the effect of nicotine in your brain):   Varenicline tartrate tablets: Available by prescription.   Ask your caregiver for advice about which medicines to use and how to use them. Carefully read the information on the package.   Everyone who is trying to quit may benefit from using a medicine. If you are pregnant or trying to become pregnant, nursing an infant, you are under age 35, or you smoke fewer than 10 cigarettes per day, talk to your caregiver before taking any nicotine replacement medicines.   You should stop using a nicotine replacement product and call your caregiver if you experience nausea, dizziness, weakness, vomiting, fast or irregular heartbeat, mouth problems with the lozenge or gum, or redness or swelling of the skin around the patch that does not go away.   Do not use any other product containing nicotine while using a nicotine replacement product.   Talk to your caregiver before using these products if you have diabetes, heart disease, asthma, stomach ulcers, you had a recent heart attack, you have high blood pressure  that is not controlled with medicine, a history of irregular heartbeat, or you have been prescribed medicine to help you quit smoking.  5. BE  PREPARED FOR RELAPSE OR DIFFICULT SITUATIONS  Most relapses occur within the first 3 months after quitting. Do not be discouraged if you start smoking again. Remember, most people try several times before they finally quit.   You may have symptoms of withdrawal because your body is used to nicotine. You may crave cigarettes, be irritable, feel very hungry, cough often, get headaches, or have difficulty concentrating.   The withdrawal symptoms are only temporary. They are strongest when you first quit, but they will go away within 10 to 14 days.  Here are some difficult situations to watch for:  Alcohol. Avoid drinking alcohol. Drinking lowers your chances of successfully quitting.   Caffeine. Try to reduce the amount of caffeine you consume. It also lowers your chances of successfully quitting.   Other smokers. Being around smoking can make you want to smoke. Avoid smokers.   Weight gain. Many smokers will gain weight when they quit, usually less than 10 pounds. Eat a healthy diet and stay active. Do not let weight gain distract you from your main goal, quitting smoking. Some medicines that help you quit smoking may also help delay weight gain. You can always lose the weight gained after you quit.   Bad mood or depression. There are a lot of ways to improve your mood other than smoking.  If you are having problems with any of these situations, talk to your caregiver. SPECIAL SITUATIONS AND CONDITIONS Studies suggest that everyone can quit smoking. Your situation or condition can give you a special reason to quit.  Pregnant women/new mothers: By quitting, you protect your baby's health and your own.   Hospitalized patients: By quitting, you reduce health problems and help healing.   Heart attack patients: By quitting, you reduce your risk of a second heart attack.   Lung, head, and neck cancer patients: By quitting, you reduce your chance of a second cancer.   Parents of children and  adolescents: By quitting, you protect your children from illnesses caused by secondhand smoke.  QUESTIONS TO THINK ABOUT Think about the following questions before you try to stop smoking. You may want to talk about your answers with your caregiver.  Why do you want to quit?   If you tried to quit in the past, what helped and what did not?   What will be the most difficult situations for you after you quit? How will you plan to handle them?   Who can help you through the tough times? Your family? Friends? Caregiver?   What pleasures do you get from smoking? What ways can you still get pleasure if you quit?  Here are some questions to ask your caregiver:  How can you help me to be successful at quitting?   What medicine do you think would be best for me and how should I take it?   What should I do if I need more help?   What is smoking withdrawal like? How can I get information on withdrawal?  Quitting takes hard work and a lot of effort, but you can quit smoking. FOR MORE INFORMATION  Smokefree.gov (http://www.davis-sullivan.com/) provides free, accurate, evidence-based information and professional assistance to help support the immediate and long-term needs of people trying to quit smoking. Document Released: 04/12/2001 Document Revised: 04/07/2011 Document Reviewed: 02/02/2009 [facility] Patient Information 2012 [place], [place].Cervical Sprain A  cervical sprain is an injury in the neck in which the ligaments are stretched or torn. The ligaments are the tissues that hold the bones of the neck (vertebrae) in place.Cervical sprains can range from very mild to very severe. Most cervical sprains get better in 1 to 3 weeks, but it depends on the cause and extent of the injury. Severe cervical sprains can cause the neck vertebrae to be unstable. This can lead to damage of the spinal cord and can result in serious nervous system problems. Your caregiver will determine whether your cervical sprain is  mild or severe. CAUSES  Severe cervical sprains may be caused by:  Contact sport injuries (football, rugby, wrestling, hockey, auto racing, gymnastics, diving, martial arts, boxing).   Motor vehicle collisions.   Whiplash injuries. This means the neck is forcefully whipped backward and forward.   Falls.  Mild cervical sprains may be caused by:   Awkward positions, such as cradling a telephone between your ear and shoulder.   Sitting in a chair that does not offer proper support.   Working at a poorly Marketing executive station.   Activities that require looking up or down for long periods of time.  SYMPTOMS   Pain, soreness, stiffness, or a burning sensation in the front, back, or sides of the neck. This discomfort may develop immediately after injury or it may develop slowly and not begin for 24 hours or more after an injury.   Pain or tenderness directly in the middle of the back of the neck.   Shoulder or upper back pain.   Limited ability to move the neck.   Headache.   Dizziness.   Weakness, numbness, or tingling in the hands or arms.   Muscle spasms.   Difficulty swallowing or chewing.   Tenderness and swelling of the neck.  DIAGNOSIS  Most of the time, your caregiver can diagnose this problem by taking your history and doing a physical exam. Your caregiver will ask about any known problems, such as arthritis in the neck or a previous neck injury. X-rays may be taken to find out if there are any other problems, such as problems with the bones of the neck. However, an X-ray often does not reveal the full extent of a cervical sprain. Other tests such as a computed tomography (CT) scan or magnetic resonance imaging (MRI) may be needed. TREATMENT  Treatment depends on the severity of the cervical sprain. Mild sprains can be treated with rest, keeping the neck in place (immobilization), and pain medicines. Severe cervical sprains need immediate immobilization and an  appointment with an orthopedist or neurosurgeon. Several treatment options are available to help with pain, muscle spasms, and other symptoms. Your caregiver may prescribe:  Medicines, such as pain relievers, numbing medicines, or muscle relaxants.   Physical therapy. This can include stretching exercises, strengthening exercises, and posture training. Exercises and improved posture can help stabilize the neck, strengthen muscles, and help stop symptoms from returning.   A neck collar to be worn for short periods of time. Often, these collars are worn for comfort. However, certain collars may be worn to protect the neck and prevent further worsening of a serious cervical sprain.  HOME CARE INSTRUCTIONS   Put ice on the injured area.   Put ice in a plastic bag.   Place a towel between your skin and the bag.   Leave the ice on for 15 to 20 minutes, 3 to 4 times a day.   Only take  over-the-counter or prescription medicines for pain, discomfort, or fever as directed by your caregiver.   Keep all follow-up appointments as directed by your caregiver.   Keep all physical therapy appointments as directed by your caregiver.   If a neck collar is prescribed, wear it as directed by your caregiver.   Do not drive while wearing a neck collar.   Make any needed adjustments to your work station to promote good posture.   Avoid positions and activities that make your symptoms worse.   Warm up and stretch before being active to help prevent problems.  SEEK MEDICAL CARE IF:   Your pain is not controlled with medicine.   You are unable to decrease your pain medicine over time as planned.   Your activity level is not improving as expected.  SEEK IMMEDIATE MEDICAL CARE IF:   You develop any bleeding, stomach upset, or signs of an allergic reaction to your medicine.   Your symptoms get worse.   You develop new, unexplained symptoms.   You have numbness, tingling, weakness, or paralysis in  any part of your body.  MAKE SURE YOU:   Understand these instructions.   Will watch your condition.   Will get help right away if you are not doing well or get worse.  Document Released: 02/13/2007 Document Revised: 04/07/2011 Document Reviewed: 01/19/2011 [facility] Patient Information 2012 [place], [place].Bronchitis Bronchitis is the body's way of reacting to injury and/or infection (inflammation) of the bronchi. Bronchi are the air tubes that extend from the windpipe into the lungs. If the inflammation becomes severe, it may cause shortness of breath. CAUSES  Inflammation may be caused by:  A virus.   Germs (bacteria).   Dust.   Allergens.   Pollutants and many other irritants.  The cells lining the bronchial tree are covered with tiny hairs (cilia). These constantly beat upward, away from the lungs, toward the mouth. This keeps the lungs free of pollutants. When these cells become too irritated and are unable to do their job, mucus begins to develop. This causes the characteristic cough of bronchitis. The cough clears the lungs when the cilia are unable to do their job. Without either of these protective mechanisms, the mucus would settle in the lungs. Then you would develop pneumonia. Smoking is a common cause of bronchitis and can contribute to pneumonia. Stopping this habit is the single most important thing you can do to help yourself. TREATMENT   Your caregiver may prescribe an antibiotic if the cough is caused by bacteria. Also, medicines that open up your airways make it easier to breathe. Your caregiver may also recommend or prescribe an expectorant. It will loosen the mucus to be coughed up. Only take over-the-counter or prescription medicines for pain, discomfort, or fever as directed by your caregiver.   Removing whatever causes the problem (smoking, for example) is critical to preventing the problem from getting worse.   Cough suppressants may be prescribed for relief of  cough symptoms.   Inhaled medicines may be prescribed to help with symptoms now and to help prevent problems from returning.   For those with recurrent (chronic) bronchitis, there may be a need for steroid medicines.  SEEK IMMEDIATE MEDICAL CARE IF:   During treatment, you develop more pus-like mucus (purulent sputum).   You have a fever.   Your baby is older than 3 months with a rectal temperature of 102 F (38.9 C) or higher.   Your baby is 30 months old or younger with  a rectal temperature of 100.4 F (38 C) or higher.   You become progressively more ill.   You have increased difficulty breathing, wheezing, or shortness of breath.  It is necessary to seek immediate medical care if you are elderly or sick from any other disease. MAKE SURE YOU:   Understand these instructions.   Will watch your condition.   Will get help right away if you are not doing well or get worse.  Document Released: 04/18/2005 Document Revised: 04/07/2011 Document Reviewed: 02/26/2008 [facility] Patient Information 2012 [place], [place].

## 2011-07-05 NOTE — Progress Notes (Signed)
  Subjective:    Patient ID: [name], female    DOB: 19-Oct-1975, 36 y.o.   MRN: [id number]  Cough This is a new problem. The current episode started yesterday. The cough is non-productive. Associated symptoms include chills, a fever, headaches, myalgias and shortness of breath. Pertinent negatives include no chest pain, ear congestion, ear pain, hemoptysis, nasal congestion, postnasal drip, rash, rhinorrhea, sore throat, sweats or wheezing. The symptoms are aggravated by lying down. Treatments tried: nyquill. The treatment provided mild (tussionex had helped) relief. Her past medical history is significant for asthma. There is no history of bronchiectasis, bronchitis, COPD, emphysema, environmental allergies or pneumonia.   She needs help to stop w/smoking cessation. Wants to use Chantix but needs medication to prevent nausea.  Concern over her son medication running out since it has been effective w/the new higher dosing.  Painover her neck and upper shoulder   Review of Systems  Constitutional: Positive for fever and chills.  HENT: Negative for ear pain, sore throat, rhinorrhea and postnasal drip.   Respiratory: Positive for cough and shortness of breath. Negative for hemoptysis and wheezing.   Cardiovascular: Negative for chest pain.  Musculoskeletal: Positive for myalgias.  Skin: Negative for rash.  Neurological: Positive for headaches.  Hematological: Negative for environmental allergies.  All other systems reviewed and are negative.      BP 113/77  Pulse 97  Temp(Src) 99.4 F (37.4 C) (Oral)  Ht 5\' 1"  (1.549 m)  Wt 129 lb (58.514 kg)  BMI 24.37 kg/m2  SpO2 99% Objective:   Physical Exam  Nursing note and vitals reviewed. Constitutional: She is oriented to person, place, and time. She appears well-developed and well-nourished. No distress.  HENT:  Head: Normocephalic and atraumatic.  Right Ear: Tympanic membrane normal.  Left Ear: Tympanic membrane  normal.  Nose: Nose normal.  Mouth/Throat: Oropharynx is clear and moist. No oropharyngeal exudate.  Eyes: Pupils are equal, round, and reactive to light. Right eye exhibits no discharge. Left eye exhibits no discharge. No scleral icterus.  Neck: Neck supple.  Cardiovascular: Normal rate, regular rhythm and normal heart sounds.   Pulmonary/Chest: No respiratory distress. She has wheezes. She has rhonchi. She has no rales.  Musculoskeletal:       tederness over trapezius musclesn  Lymphadenopathy:    She has no cervical adenopathy.  Neurological: She is alert and oriented to person, place, and time.  Skin: Skin is warm and dry.          Assessment & Plan:  #1 Bronchitis. Early treatment for bronchitis but due to rhonchi and wheezes will treat w/zpack, symbicort,tussionex, and albuterol refill  #2 smoking cessation and counciling. Will represcribe  Chantix and Zofran for the nausea side effects it causes.  #3 renew son medication for 1 month he needs to be seen.  #4 trapezius muscle tenderness. Treat w/norflex.

## 2011-07-07 ENCOUNTER — Telehealth: Payer: Self-pay | Admitting: *Deleted

## 2011-07-07 NOTE — Telephone Encounter (Signed)
Pt aware.

## 2011-07-07 NOTE — Telephone Encounter (Signed)
Pt calls stating she feels a lot worse today. She is on Zpack but now w/ fever, facial pressure and cough. She was sent home from work today. Pt would like to know how long she should stay out. Pt requests note. Please advise.

## 2011-07-07 NOTE — Telephone Encounter (Signed)
Since she was here only 1 day after her symptoms started I would recommend that she finishes the Z-Pak.  She may hav a work note until Monday and if she's not better by Tuesday as her come back in to see her to revaluate her.

## 2011-07-08 ENCOUNTER — Telehealth: Payer: Self-pay | Admitting: *Deleted

## 2011-07-08 NOTE — Telephone Encounter (Signed)
She can start the augmentin in addition to finishing the zpack if she would like.  Not sure why we have augmentin on her allergies?

## 2011-07-08 NOTE — Telephone Encounter (Signed)
Pt calls today stating she feels even worse today. She still has fever and now has ST. Pt would like to know if she could stop Zpack and start Augmentin that she has at home for better coverage incase she has strep throat.

## 2011-07-08 NOTE — Telephone Encounter (Signed)
Pt aware.

## 2011-07-11 ENCOUNTER — Telehealth: Payer: Self-pay | Admitting: *Deleted

## 2011-07-11 MED ORDER — NYSTATIN 100000 UNIT/ML MT SUSP: 500000.0000 [IU] | Freq: Four times a day (QID) | OROMUCOSAL | Status: AC

## 2011-07-11 NOTE — Telephone Encounter (Signed)
Will send over nystatin to swish and then swallow. Should clear it up.

## 2011-07-11 NOTE — Telephone Encounter (Signed)
Pt aware.

## 2011-07-11 NOTE — Telephone Encounter (Signed)
Pt calls stating she has been taking Zpack and Augmentin over the past week and now thinks she has thrush in her mouth. She denies vaginal yeast infection. Please advise.

## 2011-07-18 ENCOUNTER — Other Ambulatory Visit: Payer: Self-pay | Admitting: *Deleted

## 2011-07-18 MED ORDER — CLONAZEPAM 2 MG PO TABS: 2.0000 mg | ORAL_TABLET | Freq: Every evening | ORAL | Status: DC | PRN

## 2011-08-19 ENCOUNTER — Other Ambulatory Visit: Payer: Self-pay | Admitting: *Deleted

## 2011-08-19 MED ORDER — CLONAZEPAM 2 MG PO TABS: 2.0000 mg | ORAL_TABLET | Freq: Every evening | ORAL | Status: DC | PRN

## 2011-09-05 ENCOUNTER — Ambulatory Visit (INDEPENDENT_AMBULATORY_CARE_PROVIDER_SITE_OTHER): Payer: BC Managed Care – PPO | Admitting: Physician Assistant

## 2011-09-05 ENCOUNTER — Encounter: Payer: Self-pay | Admitting: Physician Assistant

## 2011-09-05 VITALS — BP 109/78 | HR 98 | Temp 98.4°F | Ht 61.0 in | Wt 132.0 lb

## 2011-09-05 DIAGNOSIS — Z720 Tobacco use: Secondary | ICD-10-CM

## 2011-09-05 DIAGNOSIS — J329 Chronic sinusitis, unspecified: Secondary | ICD-10-CM

## 2011-09-05 DIAGNOSIS — F17200 Nicotine dependence, unspecified, uncomplicated: Secondary | ICD-10-CM

## 2011-09-05 DIAGNOSIS — F411 Generalized anxiety disorder: Secondary | ICD-10-CM

## 2011-09-05 DIAGNOSIS — J309 Allergic rhinitis, unspecified: Secondary | ICD-10-CM

## 2011-09-05 DIAGNOSIS — J302 Other seasonal allergic rhinitis: Secondary | ICD-10-CM

## 2011-09-05 DIAGNOSIS — F419 Anxiety disorder, unspecified: Secondary | ICD-10-CM

## 2011-09-05 MED ORDER — VARENICLINE TARTRATE 0.5 MG X 11 & 1 MG X 42 PO MISC: ORAL | Status: AC

## 2011-09-05 MED ORDER — AZITHROMYCIN 250 MG PO TABS: ORAL_TABLET | ORAL | Status: AC

## 2011-09-05 MED ORDER — LEVOCETIRIZINE DIHYDROCHLORIDE 5 MG PO TABS: 5.0000 mg | ORAL_TABLET | Freq: Every evening | ORAL | Status: DC

## 2011-09-05 MED ORDER — FLUTICASONE PROPIONATE 50 MCG/ACT NA SUSP: 2.0000 | Freq: Every day | NASAL | Status: DC

## 2011-09-05 MED ORDER — CLONAZEPAM 2 MG PO TABS: 2.0000 mg | ORAL_TABLET | Freq: Two times a day (BID) | ORAL | Status: DC | PRN

## 2011-09-05 NOTE — Patient Instructions (Addendum)
Chantix was given set quit date one week from starting. Zpak for sinus infection. Try Flonase 2 sprays each nostril once a day.

## 2011-09-05 NOTE — Progress Notes (Signed)
Subjective:    Patient ID: [name], female    DOB: 06/20/1975, 36 y.o.   MRN: [id number]  Sinusitis This is a new problem. The current episode started in the past 7 days. The problem has been rapidly worsening since onset. Maximum temperature: Reports fever but has not taken her temperature. The fever has been present for less than 1 day. Her pain is at a severity of 5/10. The pain is mild. Associated symptoms include congestion, coughing, ear pain, headaches, shortness of breath, sinus pressure, sneezing, a sore throat and swollen glands. Pertinent negatives include no chills or diaphoresis. Past treatments include oral decongestants and acetaminophen (Nyquil.). The treatment provided no relief.   Needs rx for xyzal she has used sample from her office and they have worked well.   She also would like a starter pack of chantix she states that Dr. Thurmond Butts gave rx but it was not the starter pak. She is still smoking 4-6 cigs a day.  She has had a lot on her plate recently. Her father is sick and lives out of town. She is stressed with her children and wants something to take only when she needs it. She does not think she needs to be on something everyday and wants something to take as needed.   Review of Systems  Constitutional: Negative for chills and diaphoresis.  HENT: Positive for ear pain, congestion, sore throat, sneezing and sinus pressure.   Respiratory: Positive for cough and shortness of breath.   Neurological: Positive for headaches.       Objective:   Physical Exam  Constitutional: She is oriented to person, place, and time. She appears well-developed and well-nourished.  HENT:  Head: Normocephalic and atraumatic.  Right Ear: External ear normal.  Left Ear: External ear normal.  Mouth/Throat: No oropharyngeal exudate.       TM's normal. Maxillary tenderness bilaterally to palpation. Oropharynx erythematous with post nasal drip present. Turbinates red and swollen.    Eyes: Conjunctivae are normal. Right eye exhibits discharge. Left eye exhibits discharge.       Watery discharge from both eyes.  Neck: Normal range of motion. Neck supple.       Bilateral superficial anterior cervical adenopathy tenderness to palpation.  Cardiovascular: Normal rate, regular rhythm and normal heart sounds.   Pulmonary/Chest: Effort normal and breath sounds normal. She has no wheezes.  Lymphadenopathy:    She has cervical adenopathy.  Neurological: She is alert and oriented to person, place, and time.  Skin: Skin is warm and dry.  Psychiatric: She has a normal mood and affect. Her behavior is normal.          Assessment & Plan:  Sinusitis- Gave Zpak. Call if not improving. Continue to stay hydrated. Gave flonase to use 2 sprays each nostril once a day. She complains of nasal congestion so bad she can't sleep at night. I did state she could use afrin for 2-3 days at night before bed along with flonase. After that stop afrin.  Anxiety- Patient already taking klonapin at night for sleep. I talked her about the side effects of benzo and how they create dependence by eliminating the need for coping skills. I encouraged her to only use as needed but 1/4-1/2 tab of Klonapin daily when she feels really overwhelmed and anxious. Educated that if this did not work or she was starting to use them too frequently then we would need to try an every day anxiety medication that is less habit forming.  Tobacco abuse- She filled the rx Dr. Thurmond Butts gave her but it wasn't the starter pack so she wants a new prescription of the starter pack.

## 2011-09-23 ENCOUNTER — Encounter: Payer: Self-pay | Admitting: Physician Assistant

## 2011-09-23 ENCOUNTER — Ambulatory Visit (INDEPENDENT_AMBULATORY_CARE_PROVIDER_SITE_OTHER): Payer: BC Managed Care – PPO | Admitting: Physician Assistant

## 2011-09-23 VITALS — BP 127/78 | HR 108 | Ht 61.0 in | Wt 124.0 lb

## 2011-09-23 DIAGNOSIS — F411 Generalized anxiety disorder: Secondary | ICD-10-CM

## 2011-09-23 DIAGNOSIS — F419 Anxiety disorder, unspecified: Secondary | ICD-10-CM

## 2011-09-23 DIAGNOSIS — G4700 Insomnia, unspecified: Secondary | ICD-10-CM

## 2011-09-23 MED ORDER — ZOLPIDEM TARTRATE 5 MG PO TABS: 5.0000 mg | ORAL_TABLET | Freq: Every evening | ORAL | Status: DC | PRN

## 2011-09-23 MED ORDER — CITALOPRAM HYDROBROMIDE 20 MG PO TABS: 20.0000 mg | ORAL_TABLET | Freq: Every day | ORAL | Status: DC

## 2011-09-23 MED ORDER — ZOLPIDEM TARTRATE 10 MG PO TABS: 10.0000 mg | ORAL_TABLET | Freq: Every evening | ORAL | Status: DC | PRN

## 2011-09-23 MED ORDER — ALPRAZOLAM 0.5 MG PO TABS: 0.5000 mg | ORAL_TABLET | Freq: Two times a day (BID) | ORAL | Status: DC | PRN

## 2011-09-23 MED ORDER — ALPRAZOLAM 1 MG PO TABS: 1.0000 mg | ORAL_TABLET | Freq: Two times a day (BID) | ORAL | Status: DC | PRN

## 2011-09-23 NOTE — Progress Notes (Signed)
  Subjective:    Patient ID: [name], female    DOB: Nov 17, 1975, 36 y.o.   MRN: [id number]  HPI Patient presents to clinic because she cannot sleep and is having increasing anxiety. She has always had some underlying anxiety. This week has been extremely hard. She filled for divorce this week after being seperated from husband for 5 years. He does not want to pay child support and is causing a lot of problems. Her boyfriend also moved out and broke up with her 2 days ago. She finds herself crying all day and not sleeping at night. She cannot even eat she is so anxious and depressed. She has taken klonapin. It has not helped at all. She denies suicidal thoughts and she does have a good support system at work.   Review of Systems     Objective:   Physical Exam  Constitutional: She is oriented to person, place, and time. She appears well-developed and well-nourished.       Patient was crying when she walked in room.   HENT:  Head: Normocephalic and atraumatic.  Cardiovascular: Regular rhythm and normal heart sounds.        Tachycardia at 108.   Pulmonary/Chest: Effort normal and breath sounds normal. She has no wheezes.  Neurological: She is alert and oriented to person, place, and time.  Skin: Skin is warm and dry.  Psychiatric:       Patient was very upset she feels like everything is falling apart.           Assessment & Plan:  Anxiety- Start Celexa 20mg  1/2 tab for 7 days then increase to 1 tab daily. Also gave Xanax 1mg  as needed up to 2 times a day for worsening anxiety. Patient reports that klonapin not working and last time she was on xanax it was 2mg .  I did have a detailed discussion about importance of not overusing xanax and learning coping mechanism to deal with anxiety and stress. Will follow up in 6 weeks. She is informed that I do not want her staying on xanax.   Insomnia- Restarted Ambien 10mg  at night. Discussed relaxation technique to cool down at night.

## 2011-09-23 NOTE — Patient Instructions (Signed)
Start Ambien at night for sleep. Use xanax only when needed for anxiety attacks. Start Celexa 20mg  1/2 tab for 7 days and then increase to 1 tab daily. Follow up 4-6 weeks. Call if worsening symptoms or depression/anxiety.

## 2011-10-14 ENCOUNTER — Other Ambulatory Visit: Payer: Self-pay | Admitting: *Deleted

## 2011-10-14 MED ORDER — ALPRAZOLAM 1 MG PO TABS: 1.0000 mg | ORAL_TABLET | Freq: Two times a day (BID) | ORAL | Status: DC | PRN

## 2011-10-24 ENCOUNTER — Encounter: Payer: Self-pay | Admitting: *Deleted

## 2011-10-24 ENCOUNTER — Telehealth: Payer: Self-pay | Admitting: *Deleted

## 2011-10-24 NOTE — Telephone Encounter (Signed)
Yes fax work note for Friday and today.

## 2011-10-24 NOTE — Telephone Encounter (Signed)
Pt states she is having diarrhea and vomiting and has had it since Friday. States 2 people at her work have had it and would like to know if we can fax a work note to her job to [employer]: [employer] at [phone].

## 2011-10-24 NOTE — Telephone Encounter (Signed)
Work note faxed and written for Friday and Monday. Pt informed.

## 2011-10-28 ENCOUNTER — Encounter: Payer: Self-pay | Admitting: Physician Assistant

## 2011-10-28 ENCOUNTER — Ambulatory Visit (INDEPENDENT_AMBULATORY_CARE_PROVIDER_SITE_OTHER): Payer: BC Managed Care – PPO | Admitting: Physician Assistant

## 2011-10-28 VITALS — BP 110/71 | HR 93 | Ht 61.0 in | Wt 121.0 lb

## 2011-10-28 DIAGNOSIS — G4700 Insomnia, unspecified: Secondary | ICD-10-CM

## 2011-10-28 DIAGNOSIS — F329 Major depressive disorder, single episode, unspecified: Secondary | ICD-10-CM

## 2011-10-28 DIAGNOSIS — F411 Generalized anxiety disorder: Secondary | ICD-10-CM

## 2011-10-28 DIAGNOSIS — F3289 Other specified depressive episodes: Secondary | ICD-10-CM

## 2011-10-28 DIAGNOSIS — F32A Depression, unspecified: Secondary | ICD-10-CM

## 2011-10-28 MED ORDER — ALPRAZOLAM 1 MG PO TABS: 1.0000 mg | ORAL_TABLET | Freq: Two times a day (BID) | ORAL | Status: DC | PRN

## 2011-10-28 MED ORDER — CITALOPRAM HYDROBROMIDE 40 MG PO TABS: 40.0000 mg | ORAL_TABLET | Freq: Every day | ORAL | Status: DC

## 2011-10-28 MED ORDER — ZOLPIDEM TARTRATE ER 12.5 MG PO TBCR: 12.5000 mg | EXTENDED_RELEASE_TABLET | Freq: Every evening | ORAL | Status: DC | PRN

## 2011-10-28 NOTE — Patient Instructions (Addendum)
[employer] for [employer].   Increased Celexa and Ambien. Follow up in 3 months.

## 2011-10-31 NOTE — Progress Notes (Signed)
  Subjective:    Patient ID: [name], female    DOB: December 30, 1975, 36 y.o.   MRN: [id number]  HPI Patient presents to the clinic to follow up on insomnia, anxiety and depression. Pt is doing better today but still facing the problems with ex-husband and boyfriend that are causing a lot of stress. She does think the celexa helped. She does not cry all the time anymore but still has significant anxiety and feeling overwhelmed. She is having problems focusing at work. She takes ambien at night to sleep and is not working. She still has to take a xanax with it. She denies any suicidal thoughts. She wants to go to counseling and thinks she would like to use a Librarian, academic.   Review of Systems     Objective:   Physical Exam  Constitutional: She is oriented to person, place, and time. She appears well-developed and well-nourished.  HENT:  Head: Normocephalic and atraumatic.  Cardiovascular: Normal rate, regular rhythm, normal heart sounds and intact distal pulses.   Pulmonary/Chest: Effort normal and breath sounds normal. She has no wheezes.  Neurological: She is alert and oriented to person, place, and time.  Skin: Skin is warm and dry.  Psychiatric:       Mood much better but still appears anxious.          Assessment & Plan:  Anxiety and Depression- GAD-7 was 21 and PHQ was 18. Will increase Celexa and follow up in 3 months. Did refill xanax but reminded pt to only take when needed. She discusses lack of focus and ADHD but I think this might be coming from anxiety, depression and insomnia and do not want to treat with stimulant at this point. 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  Insomnia- Increased Ambien to XR 12.5. Will follow up if not working. Discussed not taking xanax with ambien. She really needs to try to use good  nighttime routine and behavioral skills to deal with bedtime anxiety. Will follow up in 3 months.

## 2011-11-18 ENCOUNTER — Other Ambulatory Visit: Payer: Self-pay | Admitting: Physician Assistant

## 2011-11-29 ENCOUNTER — Other Ambulatory Visit: Payer: Self-pay | Admitting: *Deleted

## 2011-11-29 MED ORDER — ZOLPIDEM TARTRATE ER 12.5 MG PO TBCR: 12.5000 mg | EXTENDED_RELEASE_TABLET | Freq: Every evening | ORAL | Status: DC | PRN

## 2011-11-30 ENCOUNTER — Other Ambulatory Visit: Payer: Self-pay | Admitting: *Deleted

## 2011-11-30 MED ORDER — ALPRAZOLAM 1 MG PO TABS: 1.0000 mg | ORAL_TABLET | Freq: Two times a day (BID) | ORAL | Status: DC | PRN

## 2011-12-07 ENCOUNTER — Other Ambulatory Visit: Payer: Self-pay | Admitting: *Deleted

## 2011-12-07 MED ORDER — PROMETHAZINE HCL 25 MG PO TABS: 25.0000 mg | ORAL_TABLET | Freq: Three times a day (TID) | ORAL | Status: DC | PRN

## 2011-12-27 ENCOUNTER — Other Ambulatory Visit: Payer: Self-pay | Admitting: *Deleted

## 2011-12-27 MED ORDER — ALPRAZOLAM 1 MG PO TABS: 1.0000 mg | ORAL_TABLET | Freq: Two times a day (BID) | ORAL | Status: AC | PRN

## 2011-12-29 ENCOUNTER — Other Ambulatory Visit: Payer: Self-pay | Admitting: *Deleted

## 2011-12-29 MED ORDER — ZOLPIDEM TARTRATE ER 12.5 MG PO TBCR: 12.5000 mg | EXTENDED_RELEASE_TABLET | Freq: Every evening | ORAL | Status: DC | PRN

## 2011-12-30 ENCOUNTER — Ambulatory Visit (INDEPENDENT_AMBULATORY_CARE_PROVIDER_SITE_OTHER): Payer: BC Managed Care – PPO | Admitting: Physician Assistant

## 2011-12-30 ENCOUNTER — Encounter: Payer: Self-pay | Admitting: Physician Assistant

## 2011-12-30 VITALS — BP 117/78 | HR 86 | Wt 120.0 lb

## 2011-12-30 DIAGNOSIS — Z1339 Encounter for screening examination for other mental health and behavioral disorders: Secondary | ICD-10-CM

## 2011-12-30 DIAGNOSIS — R41840 Attention and concentration deficit: Secondary | ICD-10-CM

## 2011-12-30 DIAGNOSIS — M25531 Pain in right wrist: Secondary | ICD-10-CM

## 2011-12-30 DIAGNOSIS — F3289 Other specified depressive episodes: Secondary | ICD-10-CM

## 2011-12-30 DIAGNOSIS — G4700 Insomnia, unspecified: Secondary | ICD-10-CM

## 2011-12-30 DIAGNOSIS — M25539 Pain in unspecified wrist: Secondary | ICD-10-CM

## 2011-12-30 DIAGNOSIS — F32A Depression, unspecified: Secondary | ICD-10-CM

## 2011-12-30 DIAGNOSIS — F329 Major depressive disorder, single episode, unspecified: Secondary | ICD-10-CM

## 2011-12-30 MED ORDER — ZOLPIDEM TARTRATE ER 12.5 MG PO TBCR: 12.5000 mg | EXTENDED_RELEASE_TABLET | Freq: Every evening | ORAL | Status: DC | PRN

## 2011-12-30 MED ORDER — IBUPROFEN 800 MG PO TABS: 800.0000 mg | ORAL_TABLET | Freq: Three times a day (TID) | ORAL | Status: AC | PRN

## 2011-12-30 NOTE — Patient Instructions (Addendum)
Will refer to Dr. Dellia Cloud for formal evaluation. Ice at time. Rest. Ibuprofen 800mg  up to three times a day.

## 2011-12-31 NOTE — Progress Notes (Signed)
  Subjective:    Patient ID: [name], female    DOB: [birth year], 36 y.o.   MRN: [id number]  HPI Pt presents to the clinic to follow up on insomnia and get refills, to discuss possible ADHD symptoms, and to have right wrist pain evaluated.  Insomnia- is a chronic problem. Controlled with Ambien CR. States this is the only thing that can help her sleep.   Depression- Pt is doing much better handling the stress of break up with boyfriend. Her divorce is finally made final this week. She has noticed that she is much less stressed. She has not been taking her celexa and feels controlled.   ADHD- Pt comes in with new problem with focusing and attention. She has been told for many years by her coworkers that she should be evaluated. Both of her children have ADHD. She really feels like she has had for years just never treated.   Right wrist pain- About 1 week ago son kicked her right wrist. It has been sore since. She has been wearing a brace which makes it feel better. Movement makes worse. She has not taken anthing by mouth for pain. Denies any numbness and tingling or changes in strength. She has not noticed any swelling or bruising.    Review of Systems     Objective:   Physical Exam  Constitutional: She is oriented to person, place, and time. She appears well-developed and well-nourished.  HENT:  Head: Normocephalic and atraumatic.  Cardiovascular: Normal rate, regular rhythm and normal heart sounds.   Pulmonary/Chest: Effort normal and breath sounds normal. She has no wheezes.  Musculoskeletal:       Full ROM with some pain with lateral movement left to right of right wrist. Hand grip 5/5. Tenderness at the base of the right wrist posteriorly. No swelling, brusing, or edema.  Neurological: She is alert and oriented to person, place, and time.  Skin: Skin is warm and dry.  Psychiatric: She has a normal mood and affect. Her behavior is normal.          Assessment &  Plan:  ADHD evaluation- ADD screening positive with 8/13.Pt has a long hx of ADHD like symptoms but has never been evaluated and never been treated. Will send for formal eval to Dr. Dellia Cloud. Will treat according. Do think it is wise since she has concomitant depression and insomnia.  Depression- Pt is feeling much better. Stopped Celexa.will continue to monitor. Encouraged regular exercise and healthy diet.  Insomnia- Ambien refilled.  Right wrist pain- pt declined x-ray stating her insurance would not pay for it. I suspect it is a wrist sprain but I cannot rule out fracture without xray. She already had a wrist brace. I encouraged her to wear brace for the next 1-2 weeks. Ice 20 min couple of times a day and do ROM exercises when taking out of brace. Ibuprofen 800mg  TID can also help with pain. Call if worsening or not improving in next week.

## 2012-01-06 ENCOUNTER — Encounter: Payer: Self-pay | Admitting: Family Medicine

## 2012-01-06 ENCOUNTER — Ambulatory Visit (INDEPENDENT_AMBULATORY_CARE_PROVIDER_SITE_OTHER): Payer: BC Managed Care – PPO | Admitting: Family Medicine

## 2012-01-06 VITALS — BP 110/70 | HR 80 | Temp 97.7°F | Resp 18 | Wt 123.0 lb

## 2012-01-06 DIAGNOSIS — A499 Bacterial infection, unspecified: Secondary | ICD-10-CM

## 2012-01-06 DIAGNOSIS — J329 Chronic sinusitis, unspecified: Secondary | ICD-10-CM

## 2012-01-06 DIAGNOSIS — R110 Nausea: Secondary | ICD-10-CM

## 2012-01-06 DIAGNOSIS — B9689 Other specified bacterial agents as the cause of diseases classified elsewhere: Secondary | ICD-10-CM

## 2012-01-06 MED ORDER — CEFTRIAXONE SODIUM 1 G IJ SOLR
250.0000 mg | Freq: Once | INTRAMUSCULAR | Status: AC
  Administered 2012-01-06: 250 mg via INTRAMUSCULAR

## 2012-01-06 MED ORDER — PROMETHAZINE HCL 50 MG PO TABS: 50.0000 mg | ORAL_TABLET | Freq: Four times a day (QID) | ORAL | Status: DC | PRN

## 2012-01-06 MED ORDER — AMOXICILLIN-POT CLAVULANATE 875-125 MG PO TABS: 1.0000 | ORAL_TABLET | Freq: Two times a day (BID) | ORAL | Status: AC

## 2012-01-06 NOTE — Progress Notes (Signed)
CC: [name] is a 36 y.o. female is here for Nasal Congestion   Subjective: HPI:  Patient reports having facial pressure just above the eyes along with nasal congestion, fevers, chills, and fatigue to the point where she hasn't wanted to get out of bed the past 3 days has been present for over a week now. She tells me when this started she took 5 days of azithromycin did not have an improvement, to 3 days ago she started another Z-Pak without any improvement of the today. She feels like her symptoms are getting worse a daily basis. New symptoms over the past 2-3 days include nausea which she believes is 2 to the mucous she's following. She denies confusion, motor sensory disturbance, neck pain, cough, stage a, shortness of breath, chest pain, orthopnea, abdominal pain, urinary complaints, nor rash. She's not been using any over-the-counter as of yet. Symptoms are present 24 hours a day. She specifically asking for a shot of antibiotics.   Review Of Systems Outlined In HPI  Past Medical History  Diagnosis Date  . Anxiety   . Headache   . IBS (irritable bowel syndrome)     questionable, per pt; saw Dr. Loreta Ave  . IC (interstitial cystitis)   . Insomnia      Family History  Problem Relation Age of Onset  . Heart attack Father      History  Substance Use Topics  . Smoking status: Former Smoker -- 0.3 packs/day    Types: Cigarettes  . Smokeless tobacco: Never Used  . Alcohol Use: Yes     drinks- 4 q wk/ smokes 2 cigs per day- quiting 04/05/11     Objective: Filed Vitals:   01/06/12 1029  BP: 110/70  Pulse: 80  Temp: 97.7 F (36.5 C)  Resp: 18    General: Alert and Oriented, No Acute Distress HEENT: Pupils equal, round, reactive to light. Conjunctivae clear.  External ears unremarkable, canals clear with intact TMs with appropriate landmarks.  Middle ear appears open without effusion. Pink inferior turbinates.  Moist mucous membranes, pharynx without inflammation  nor lesions.  Neck supple without palpable lymphadenopathy nor abnormal masses. Frontal sinus tenderness bilaterally to percussion. Lungs: Clear to auscultation bilaterally, no wheezing/ronchi/rales.  Comfortable work of breathing. Good air movement. Cardiac: Regular rate and rhythm. Normal S1/S2.  No murmurs, rubs, nor gallops.   Extremities: No peripheral edema.  Strong peripheral pulses.  Mental Status: No depression, anxiety, nor agitation. Skin: Warm and dry.  Assessment & Plan: [name] was seen today for nasal congestion.  Diagnoses and associated orders for this visit:  Nausea - promethazine (PHENERGAN) 50 MG tablet; Take 1 tablet (50 mg total) by mouth every 6 (six) hours as needed for nausea.  Bacterial sinusitis - amoxicillin-clavulanate (AUGMENTIN) 875-125 MG per tablet; Take 1 tablet by mouth 2 (two) times daily. - cefTRIAXone (ROCEPHIN) injection 250 mg; Inject 0.25 g (250 mg total) into the muscle once.  Patient states that Augmentin which is to start her allergy list is incorrect and it's actually her son who is allergic to this. Discussed with her that Augmentin as first-line treatment for bacterial sinusitis and she is in agreement to starting this again asked for a antibiotic injection. Discussed the risks and benefits and I could not guarantee that she be feeling better with a shot versus without shot. She preferred to have Rocephin and will start on Augmentin. Stop azithromycin. Start nasal saline sprays, resume nasal steroids she tells me she stopped weeks ago.  Return if symptoms worsen or fail to improve.  Requested Prescriptions   Signed Prescriptions Disp Refills  . promethazine (PHENERGAN) 50 MG tablet 30 tablet 0    Sig: Take 1 tablet (50 mg total) by mouth every 6 (six) hours as needed for nausea.  [name] amoxicillin-clavulanate (AUGMENTIN) 875-125 MG per tablet 20 tablet 0    Sig: Take 1 tablet by mouth 2 (two) times daily.

## 2012-01-06 NOTE — Patient Instructions (Addendum)

## 2012-01-09 ENCOUNTER — Ambulatory Visit (INDEPENDENT_AMBULATORY_CARE_PROVIDER_SITE_OTHER): Payer: BC Managed Care – PPO | Admitting: Physician Assistant

## 2012-01-09 ENCOUNTER — Encounter: Payer: Self-pay | Admitting: Physician Assistant

## 2012-01-09 VITALS — BP 126/66 | HR 90 | Temp 97.9°F | Ht 61.0 in | Wt 121.0 lb

## 2012-01-09 DIAGNOSIS — A499 Bacterial infection, unspecified: Secondary | ICD-10-CM

## 2012-01-09 DIAGNOSIS — J329 Chronic sinusitis, unspecified: Secondary | ICD-10-CM

## 2012-01-09 DIAGNOSIS — B9689 Other specified bacterial agents as the cause of diseases classified elsewhere: Secondary | ICD-10-CM

## 2012-01-09 DIAGNOSIS — R062 Wheezing: Secondary | ICD-10-CM

## 2012-01-09 MED ORDER — METHYLPREDNISOLONE 4 MG PO KIT: PACK | ORAL | Status: AC

## 2012-01-09 NOTE — Patient Instructions (Addendum)
  Use nasal spray each nostril once a day. Medrol dose pak sent to pharmacy.   Sinusitis Sinuses are air pockets within the bones of your face. The growth of bacteria within a sinus leads to infection. The infection prevents the sinuses from draining. This infection is called sinusitis. SYMPTOMS  There will be different areas of pain depending on which sinuses have become infected.  The maxillary sinuses often produce pain beneath the eyes.   Frontal sinusitis may cause pain in the middle of the forehead and above the eyes.  Other problems (symptoms) include:  Toothaches.   Colored, pus-like (purulent) drainage from the nose.   Swelling, warmth, and tenderness over the sinus areas may be signs of infection.  TREATMENT  Sinusitis is most often determined by an exam.X-rays may be taken. If x-rays have been taken, make sure you obtain your results or find out how you are to obtain them. Your caregiver may give you medications (antibiotics). These are medications that will help kill the bacteria causing the infection. You may also be given a medication (decongestant) that helps to reduce sinus swelling.  HOME CARE INSTRUCTIONS   Only take over-the-counter or prescription medicines for pain, discomfort, or fever as directed by your caregiver.   Drink extra fluids. Fluids help thin the mucus so your sinuses can drain more easily.   Applying either moist heat or ice packs to the sinus areas may help relieve discomfort.   Use saline nasal sprays to help moisten your sinuses. The sprays can be found at your local drugstore.  SEEK IMMEDIATE MEDICAL CARE IF:  You have a fever.   You have increasing pain, severe headaches, or toothache.   You have nausea, vomiting, or drowsiness.   You develop unusual swelling around the face or trouble seeing.  MAKE SURE YOU:   Understand these instructions.   Will watch your condition.   Will get help right away if you are not doing well or get  worse.  Document Released: 04/18/2005 Document Revised: 04/07/2011 Document Reviewed: 11/15/2006 [facility] Patient Information 2012 [place], [place].

## 2012-01-09 NOTE — Progress Notes (Signed)
  Subjective:    Patient ID: [name], female    DOB: [birth year], 36 y.o.   MRN: [id number]  HPI Patient is a 36 yo female who presents to the clinic to follow up on sinusitis. She has finished zpak and augmentin with a shot of rocephin. She was last seen in office on Friday with Dr. Ivan Anchors. She still feels like there is a lot of sinus pressure, has dry cough, and has been more SOB recently. She has a hx of asthma exacerbation when she gets sick. She is currently taking Motrin, Augmentin. She feels hot at night but not running a temperature. She has noticed some wheezing mostly at night. She has not taken symbicort since last exacerbation over 1 year ago.     Review of Systems     Objective:   Physical Exam  Constitutional: She is oriented to person, place, and time. She appears well-developed and well-nourished.  HENT:  Head: Normocephalic and atraumatic.  Right Ear: External ear normal.  Left Ear: External ear normal.  Mouth/Throat: Oropharynx is clear and moist. No oropharyngeal exudate.       She does complain of maxillary and frontal tenderness bilaterally today. Bilateral turbinates red and swollen.  Eyes: Conjunctivae are normal.  Neck: Normal range of motion. Neck supple.  Cardiovascular: Normal rate, regular rhythm, normal heart sounds and intact distal pulses.   Pulmonary/Chest: Effort normal and breath sounds normal. She has no wheezes.       Inspiratory wheezing heard faintly over left upper quadrant.  Lymphadenopathy:    She has no cervical adenopathy.  Neurological: She is alert and oriented to person, place, and time.  Skin: Skin is warm and dry.  Psychiatric: She has a normal mood and affect. Her behavior is normal.          Assessment & Plan:  Sinusitis/wheezing heard on auscultation- Gave medrol dose pak. Continue to take Augmentin. Reassured pt that I do not think we need to change abx. Recommended that she use her albuterol inhaler as needed  for SOB. If symptoms have not improved in 1 week we need to discuss going back on ICS, of course, only if symptoms persist. Did give sample of nasal spray Zetonna to try daily to see if that helps open up her nasal passage way.

## 2012-01-10 ENCOUNTER — Ambulatory Visit: Payer: BC Managed Care – PPO | Admitting: Family Medicine

## 2012-01-16 ENCOUNTER — Ambulatory Visit: Payer: BC Managed Care – PPO | Admitting: Psychology

## 2012-02-09 ENCOUNTER — Other Ambulatory Visit: Payer: Self-pay | Admitting: Family Medicine

## 2012-02-09 NOTE — Telephone Encounter (Signed)
Dr. Thurmond Butts are you ok with filling this med

## 2012-02-09 NOTE — Telephone Encounter (Signed)
Initially I was going to approve it but when I look at her records I don't see that we have ever placed her on this medication before. Am I missing something?

## 2012-02-09 NOTE — Telephone Encounter (Signed)
Then I will fill this prescription.

## 2012-02-09 NOTE — Telephone Encounter (Signed)
Looking back it looks like Jade prescribed her this for anxiety back in June

## 2012-02-12 ENCOUNTER — Other Ambulatory Visit: Payer: Self-pay | Admitting: Family Medicine

## 2012-02-13 NOTE — Telephone Encounter (Signed)
[name] are you ok to fill this

## 2012-02-24 ENCOUNTER — Ambulatory Visit (INDEPENDENT_AMBULATORY_CARE_PROVIDER_SITE_OTHER): Payer: BC Managed Care – PPO | Admitting: Physician Assistant

## 2012-02-24 ENCOUNTER — Encounter: Payer: Self-pay | Admitting: Physician Assistant

## 2012-02-24 VITALS — BP 121/78 | HR 88 | Temp 98.0°F | Ht 61.0 in | Wt 126.0 lb

## 2012-02-24 DIAGNOSIS — G4700 Insomnia, unspecified: Secondary | ICD-10-CM

## 2012-02-24 DIAGNOSIS — R51 Headache: Secondary | ICD-10-CM

## 2012-02-24 DIAGNOSIS — J329 Chronic sinusitis, unspecified: Secondary | ICD-10-CM

## 2012-02-24 DIAGNOSIS — Z8669 Personal history of other diseases of the nervous system and sense organs: Secondary | ICD-10-CM

## 2012-02-24 MED ORDER — MOMETASONE FUROATE 50 MCG/ACT NA SUSP: 2.0000 | Freq: Every day | NASAL | Status: DC

## 2012-02-24 MED ORDER — RIZATRIPTAN BENZOATE 5 MG PO TBDP: 5.0000 mg | ORAL_TABLET | ORAL | Status: DC | PRN

## 2012-02-24 MED ORDER — ZOLPIDEM TARTRATE ER 12.5 MG PO TBCR: 12.5000 mg | EXTENDED_RELEASE_TABLET | Freq: Every evening | ORAL | Status: DC | PRN

## 2012-02-24 MED ORDER — METHYLPREDNISOLONE 4 MG PO KIT: PACK | ORAL | Status: DC

## 2012-02-24 NOTE — Progress Notes (Signed)
  Subjective:    Patient ID: [name], female    DOB: [birth year], 36 y.o.   MRN: [id number]  HPI Patient is a 36 female who presents to the clinic with sinus pressure, headache for 4 days. She reports that she has felt warm but has not checked her temperature. Her top teeth and begin to take in the last 24 hours. Patient has a history of migraines. She denies that any migraines have occurred recently but she feels like this headache is going to turn into migraine. She does not have any medication for this. She's been taking Delsym, Motrin for headache and cough. She also had some Augmentin that she has started for the last 3 days taking twice a day. She denies any shortness of breath, chest pain, palpitations. She denies any sick contacts.  Patient reports insomnia is much improved. Needs refill on Ambien today.   Review of Systems     Objective:   Physical Exam  Constitutional: She is oriented to person, place, and time. She appears well-developed and well-nourished.  HENT:  Head: Normocephalic and atraumatic.  Right Ear: External ear normal.  Left Ear: External ear normal.  Nose: Nose normal.  Mouth/Throat: Oropharynx is clear and moist. No oropharyngeal exudate.       TMs clear with no erythema, pus, blood.  Bilateral maxillary tenderness to palpation.  Eyes: Conjunctivae normal are normal.  Neck: Normal range of motion. Neck supple.  Cardiovascular: Normal rate and normal heart sounds.   Pulmonary/Chest: Effort normal and breath sounds normal. She has no wheezes.  Lymphadenopathy:    She has no cervical adenopathy.  Neurological: She is alert and oriented to person, place, and time.  Skin: Skin is warm and dry.  Psychiatric: She has a normal mood and affect. Her behavior is normal.          Assessment & Plan:  Sinusitis/history of migraines-go ahead and finish out Augmentin dose and started from previous provider. Gave Medrol Dosepak to take. Continue  over-the-counter medications for symptomatic care. Refilled Maxalt for migraines if she were to have a headache that seemed to not resolve. Call office if not improving.  Insomnia-controlled with Ambien CR. Will refill today.  Declines flu shot due to Illness.

## 2012-02-24 NOTE — Patient Instructions (Addendum)

## 2012-03-01 ENCOUNTER — Telehealth: Payer: Self-pay | Admitting: *Deleted

## 2012-03-01 NOTE — Telephone Encounter (Signed)
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 Please advise. Was told to call back if no better. Still a lot of head congestion and headache.

## 2012-03-01 NOTE — Telephone Encounter (Signed)
Pt notified and sent to schedule appt 

## 2012-03-01 NOTE — Telephone Encounter (Signed)
Please encourage her to come in to see one of [place] tomorrow if she's still no better.

## 2012-03-02 ENCOUNTER — Ambulatory Visit (INDEPENDENT_AMBULATORY_CARE_PROVIDER_SITE_OTHER): Payer: BC Managed Care – PPO | Admitting: Physician Assistant

## 2012-03-02 ENCOUNTER — Encounter: Payer: Self-pay | Admitting: Physician Assistant

## 2012-03-02 VITALS — BP 116/78 | HR 96 | Temp 98.1°F | Ht 61.0 in | Wt 124.0 lb

## 2012-03-02 DIAGNOSIS — H6691 Otitis media, unspecified, right ear: Secondary | ICD-10-CM

## 2012-03-02 DIAGNOSIS — H6690 Otitis media, unspecified, unspecified ear: Secondary | ICD-10-CM

## 2012-03-02 MED ORDER — CEFDINIR 300 MG PO CAPS: 300.0000 mg | ORAL_CAPSULE | Freq: Two times a day (BID) | ORAL | Status: DC

## 2012-03-02 NOTE — Progress Notes (Signed)
  Subjective:    Patient ID: [name], female    DOB: 06/26/1975, 36 y.o.   MRN: [id number]  HPI Patient presents to the clinic to follow up on dx of sinusitis last week. She was finishing up augmentin and medrol dose pak was also given. She is doing some better but still has a lot of sinus pressure and now her right ear is very painful. She has not noticed any drainage coming from ear. No fever. She suspects that she might be allergic to the dog she is sitting. She has really bad allergies. No chills, muscle aches, N/V. Has started using some ciprodex drops in right ear but has not really helped. She has nasonex but not using regularly. It did help previously.   Review of Systems     Objective:   Physical Exam  Constitutional: She is oriented to person, place, and time. She appears well-developed and well-nourished.  HENT:  Head: Normocephalic and atraumatic.  Left Ear: External ear normal.       Left TM normal.  Right TM erythematous external canal and TM. Appears to be buldging and dull with no light reflex. No pus or blood in canal.   Turbinates are red and swollen bilaterally.   Tenderness to palpation over maxillary and frontal sinuses.   Eyes: Conjunctivae normal are normal.  Neck: Normal range of motion. Neck supple.  Cardiovascular: Normal rate, regular rhythm and normal heart sounds.   Pulmonary/Chest: Effort normal and breath sounds normal. She has no wheezes.  Lymphadenopathy:    She has no cervical adenopathy.  Neurological: She is alert and oriented to person, place, and time.  Skin: Skin is warm and dry.  Psychiatric: She has a normal mood and affect. Her behavior is normal.          Assessment & Plan:  Sinusitis/right otitis media- Unusual but even being on augmentin patient developed right otitis media. Will treat with omnicef for 10 days. Call if not improving. Encouraged use of nasonex. Discussed with possible need for CT of sinuses. Also  discussed since recent sexual partner change testing for HIV. I do not suspect this but with the recent sickness would want to consider immune issues. If not improving will have further discussion.

## 2012-03-02 NOTE — Patient Instructions (Addendum)

## 2012-03-05 ENCOUNTER — Telehealth: Payer: Self-pay | Admitting: *Deleted

## 2012-03-05 DIAGNOSIS — H9201 Otalgia, right ear: Secondary | ICD-10-CM

## 2012-03-05 NOTE — Telephone Encounter (Signed)
Ok for note for today. I am going to refer to ENT. Need further evaluation since 2 oral abx and 1 external and still pain.

## 2012-03-05 NOTE — Telephone Encounter (Signed)
Note faxed to work. KG LPN

## 2012-03-05 NOTE — Telephone Encounter (Signed)
Pt calls and states that her right ear still hurting today so did not go to work and would like a work note for today. Fax to work at [phone] Attn: Mellissa Kohut

## 2012-04-10 ENCOUNTER — Other Ambulatory Visit: Payer: Self-pay | Admitting: *Deleted

## 2012-04-10 MED ORDER — ALPRAZOLAM 1 MG PO TABS: 1.0000 mg | ORAL_TABLET | Freq: Two times a day (BID) | ORAL | Status: DC | PRN

## 2012-04-11 ENCOUNTER — Ambulatory Visit (INDEPENDENT_AMBULATORY_CARE_PROVIDER_SITE_OTHER): Payer: BC Managed Care – PPO | Admitting: Physician Assistant

## 2012-04-11 ENCOUNTER — Encounter: Payer: Self-pay | Admitting: Physician Assistant

## 2012-04-11 VITALS — BP 114/69 | HR 91 | Temp 97.8°F | Wt 125.0 lb

## 2012-04-11 DIAGNOSIS — R509 Fever, unspecified: Secondary | ICD-10-CM

## 2012-04-11 DIAGNOSIS — R6889 Other general symptoms and signs: Secondary | ICD-10-CM

## 2012-04-11 DIAGNOSIS — J111 Influenza due to unidentified influenza virus with other respiratory manifestations: Secondary | ICD-10-CM

## 2012-04-11 DIAGNOSIS — R110 Nausea: Secondary | ICD-10-CM

## 2012-04-11 DIAGNOSIS — R6883 Chills (without fever): Secondary | ICD-10-CM

## 2012-04-11 LAB — POCT INFLUENZA A/B
Influenza A, POC: NEGATIVE
Influenza B, POC: NEGATIVE

## 2012-04-11 MED ORDER — PROMETHAZINE HCL 25 MG/ML IJ SOLN: 25.0000 mg | Freq: Once | INTRAMUSCULAR | Status: DC

## 2012-04-11 MED ORDER — PROMETHAZINE HCL 25 MG PO TABS: 25.0000 mg | ORAL_TABLET | Freq: Four times a day (QID) | ORAL | Status: DC | PRN

## 2012-04-11 MED ORDER — CETIRIZINE HCL 10 MG PO TABS: 10.0000 mg | ORAL_TABLET | Freq: Every day | ORAL | Status: DC

## 2012-04-11 NOTE — Progress Notes (Signed)
  Subjective:    Patient ID: [name], female    DOB: 1975/12/01, 36 y.o.   MRN: [id number]  HPI Patient presents to the clinic with flu-like symptoms that started early this morning. She has had a lot of nausea and stomach upset. She has dry heaved but denies any vomiting. She does not have an appetite. She feels hot but has not taken her temperature. She just had aleve 2 hrs ago and no temp in office. Has had a few loose stools. Denies any abdominal cramping pain or any upper respiratory symptoms(Ear pain/ST/sinus pressure). She feels very achy and week. She has not had a flu shot because allergic to egg.   Review of Systems     Objective:   Physical Exam  Constitutional: She is oriented to person, place, and time. She appears well-developed and well-nourished.  HENT:  Head: Normocephalic and atraumatic.  Right Ear: External ear normal.  Left Ear: External ear normal.  Nose: Nose normal.  Mouth/Throat: Oropharynx is clear and moist. No oropharyngeal exudate.       TM's normal bilaterally.   Negative for sinus pressure.  Eyes: Conjunctivae normal are normal.  Neck: Normal range of motion. Neck supple.  Cardiovascular: Normal rate, regular rhythm and normal heart sounds.   Pulmonary/Chest: Effort normal and breath sounds normal. She has no wheezes.  Abdominal: Soft. Bowel sounds are normal. She exhibits no distension and no mass. There is no tenderness. There is no guarding.  Lymphadenopathy:    She has no cervical adenopathy.  Neurological: She is alert and oriented to person, place, and time.  Skin: Skin is warm and dry.  Psychiatric: She has a normal mood and affect. Her behavior is normal.          Assessment & Plan:  Flu like symptoms- Influenza was negative. Reassured that likely a virus. Symptomatic care given. Shot of phenergan was given in office and rx to take home. Advance diet as tolerated. Wash hands. Motrin for pain and fever. Call if not improving.     Wrote letter to inform employer of allergy to egg and to not have flu shot with those components.

## 2012-04-11 NOTE — Patient Instructions (Addendum)
Diet for Diarrhea, Adult Having frequent, runny stools (diarrhea) has many causes. Diarrhea may be caused or worsened by food or drink. Diarrhea may be relieved by changing your diet. IF YOU ARE NOT TOLERATING SOLID FOODS:  Drink enough water and fluids to keep your urine clear or pale yellow.  Avoid sugary drinks and sodas as well as milk-based beverages.  Avoid beverages containing caffeine and alcohol.  You may try rehydrating beverages. You can make your own by following this recipe:   tsp table salt.   tsp baking soda.   tsp salt substitute (potassium chloride).  1 tbs + 1 tsp sugar.  1 qt water. As your stools become more solid, you can start eating solid foods. Add foods one at a time. If a certain food causes your diarrhea to get worse, avoid that food and try other foods. A low fiber, low-fat, and lactose-free diet is recommended. Small, frequent meals may be better tolerated.  Starches  Allowed:  White, French, and pita breads, plain rolls, buns, bagels. Plain muffins, matzo. Soda, saltine, or graham crackers. Pretzels, melba toast, zwieback. Cooked cereals made with water: cornmeal, farina, cream cereals. Dry cereals: refined corn, wheat, rice. Potatoes prepared any way without skins, refined macaroni, spaghetti, noodles, refined rice.  Avoid:  Bread, rolls, or crackers made with whole wheat, multi-grains, rye, bran seeds, nuts, or coconut. Corn tortillas or taco shells. Cereals containing whole grains, multi-grains, bran, coconut, nuts, or raisins. Cooked or dry oatmeal. Coarse wheat cereals, granola. Cereals advertised as "high-fiber." Potato skins. Whole grain pasta, wild or brown rice. Popcorn. Sweet potatoes/yams. Sweet rolls, doughnuts, waffles, pancakes, sweet breads. Vegetables  Allowed: Strained tomato and vegetable juices. Most well-cooked and canned vegetables without seeds. Fresh: Tender lettuce, cucumber without the skin, cabbage, spinach, bean  sprouts.  Avoid: Fresh, cooked, or canned: Artichokes, baked beans, beet greens, broccoli, Brussels sprouts, corn, kale, legumes, peas, sweet potatoes. Cooked: Green or red cabbage, spinach. Avoid large servings of any vegetables, because vegetables shrink when cooked, and they contain more fiber per serving than fresh vegetables. Fruit  Allowed: All fruit juices except prune juice. Cooked or canned: Apricots, applesauce, cantaloupe, cherries, fruit cocktail, grapefruit, grapes, kiwi, mandarin oranges, peaches, pears, plums, watermelon. Fresh: Apples without skin, ripe banana, grapes, cantaloupe, cherries, grapefruit, peaches, oranges, plums. Keep servings limited to  cup or 1 piece.  Avoid: Fresh: Apple with skin, apricots, mango, pears, raspberries, strawberries. Prune juice, stewed or dried prunes. Dried fruits, raisins, dates. Large servings of all fresh fruits. Meat and Meat Substitutes  Allowed: Ground or well-cooked tender beef, ham, veal, lamb, pork, or poultry. Eggs, plain cheese. Fish, oysters, shrimp, lobster, other seafoods. Liver, organ meats.  Avoid: Tough, fibrous meats with gristle. Peanut butter, smooth or chunky. Cheese, nuts, seeds, legumes, dried peas, beans, lentils. Milk  Allowed: Yogurt, lactose-free milk, kefir, drinkable yogurt, buttermilk, soy milk.  Avoid: Milk, chocolate milk, beverages made with milk, such as milk shakes. Soups  Allowed: Bouillon, broth, or soups made from allowed foods. Any strained soup.  Avoid: Soups made from vegetables that are not allowed, cream or milk-based soups. Desserts and Sweets  Allowed: Sugar-free gelatin, sugar-free frozen ice pops made without sugar alcohol.  Avoid: Plain cakes and cookies, pie made with allowed fruit, pudding, custard, cream pie. Gelatin, fruit, ice, sherbet, frozen ice pops. Ice cream, ice milk without nuts. Plain hard candy, honey, jelly, molasses, syrup, sugar, chocolate syrup, gumdrops,  marshmallows. Fats and Oils  Allowed: Avoid any fats and oils.  Avoid:   Seeds, nuts, olives, avocados. Margarine, butter, cream, mayonnaise, salad oils, plain salad dressings made from allowed foods. Plain gravy, crisp bacon without rind. Beverages  Allowed: Water, decaffeinated teas, oral rehydration solutions, sugar-free beverages.  Avoid: Fruit juices, caffeinated beverages (coffee, tea, soda or pop), alcohol, sports drinks, or lemon-lime soda or pop. Condiments  Allowed: Ketchup, mustard, horseradish, vinegar, cream sauce, cheese sauce, cocoa powder. Spices in moderation: allspice, basil, bay leaves, celery powder or leaves, cinnamon, cumin powder, curry powder, ginger, mace, marjoram, onion or garlic powder, oregano, paprika, parsley flakes, ground pepper, rosemary, sage, savory, tarragon, thyme, turmeric.  Avoid: Coconut, honey. Weight Monitoring: Weigh yourself every day. You should weigh yourself in the morning after you urinate and before you eat breakfast. Wear the same amount of clothing when you weigh yourself. Record your weight daily. Bring your recorded weights to your clinic visits. Tell your caregiver right away if you have gained 3 lb/1.4 kg or more in 1 day, 5 lb/2.3 kg in a week, or whatever amount you were told to report. SEEK IMMEDIATE MEDICAL CARE IF:   You are unable to keep fluids down.  You start to throw up (vomit) or diarrhea keeps coming back (persistent).  Abdominal pain develops, increases, or can be felt in one place (localizes).  You have an oral temperature above 102 F (38.9 C), not controlled by medicine.  Diarrhea contains blood or mucus.  You develop excessive weakness, dizziness, fainting, or extreme thirst. MAKE SURE YOU:   Understand these instructions.  Will watch your condition.  Will get help right away if you are not doing well or get worse. Document Released: 07/09/2003 Document Revised: 07/11/2011 Document Reviewed:  09/02/2011 Exit[employer] Patient Information 2013 [employer].  

## 2012-04-29 ENCOUNTER — Other Ambulatory Visit: Payer: Self-pay | Admitting: Physician Assistant

## 2012-05-28 ENCOUNTER — Other Ambulatory Visit: Payer: Self-pay | Admitting: Physician Assistant

## 2012-06-29 ENCOUNTER — Other Ambulatory Visit: Payer: Self-pay | Admitting: Physician Assistant

## 2012-07-16 ENCOUNTER — Ambulatory Visit (INDEPENDENT_AMBULATORY_CARE_PROVIDER_SITE_OTHER): Payer: BC Managed Care – PPO | Admitting: Physician Assistant

## 2012-07-16 ENCOUNTER — Encounter: Payer: Self-pay | Admitting: Physician Assistant

## 2012-07-16 VITALS — BP 107/60 | HR 92 | Temp 98.4°F | Wt 127.0 lb

## 2012-07-16 DIAGNOSIS — J019 Acute sinusitis, unspecified: Secondary | ICD-10-CM

## 2012-07-16 DIAGNOSIS — F411 Generalized anxiety disorder: Secondary | ICD-10-CM

## 2012-07-16 MED ORDER — AZITHROMYCIN 250 MG PO TABS: ORAL_TABLET | ORAL | Status: DC

## 2012-07-16 MED ORDER — CITALOPRAM HYDROBROMIDE 20 MG PO TABS: ORAL_TABLET | ORAL | Status: DC

## 2012-07-16 MED ORDER — ALPRAZOLAM 1 MG PO TABS: ORAL_TABLET | ORAL | Status: DC

## 2012-07-16 NOTE — Patient Instructions (Addendum)

## 2012-07-16 NOTE — Progress Notes (Signed)
  Subjective:    Patient ID: SHELDA TRUBY, female    DOB: Sep 17, 1975, 37 y.o.   MRN: [id number]  Sinusitis This is a new problem. The current episode started 1 to 4 weeks ago. The problem has been gradually worsening since onset. There has been no fever. Her pain is at a severity of 6/10. The pain is moderate. Associated symptoms include congestion, coughing, headaches and sinus pressure. Pertinent negatives include no chills, diaphoresis, ear pain, hoarse voice, neck pain, shortness of breath, sneezing, sore throat or swollen glands. Past treatments include acetaminophen, saline sprays, lying down and oral decongestants (Delsym). The treatment provided no relief.   Patient also wants to go back on celexa. She had went off for anxiety because things were going well. Now pt is under a lot of stress with her son. He has been seen by neurologist, psychologist. He has appt for OT. He has Opposition defiant Disorder and ADHD. She cannot discipiline him at all. She is also having problems with her mother. She has noticed she is using xanax everyday twice a day and wants to see if going back on her meds will help.    Review of Systems  Constitutional: Negative for chills and diaphoresis.  HENT: Positive for congestion and sinus pressure. Negative for ear pain, sore throat, hoarse voice, sneezing and neck pain.   Respiratory: Positive for cough. Negative for shortness of breath.   Neurological: Positive for headaches.       Objective:   Physical Exam  Constitutional: She is oriented to person, place, and time. She appears well-developed and well-nourished.  HENT:  Head: Normocephalic and atraumatic.  Right Ear: External ear normal.  Left Ear: External ear normal.  Nose: Nose normal.  Mouth/Throat: Oropharynx is clear and moist.  TM's clear.  Frontal and maxillary sinus pressure.  Eyes: Conjunctivae are normal.  Neck: Normal range of motion. Neck supple.  Cardiovascular: Normal rate,  regular rhythm and normal heart sounds.   Pulmonary/Chest: Effort normal and breath sounds normal.  Lymphadenopathy:    She has no cervical adenopathy.  Neurological: She is alert and oriented to person, place, and time.  Skin: Skin is warm and dry.  Psychiatric: She has a normal mood and affect. Her behavior is normal.          Assessment & Plan:  Sinusitis- zpak given. Discussed symptomatic treatment. Continue nasal spray given by allergist. Call if not improving could consider steroid but do not want to continue to give steroids for every sinus infection.  Anxiety- Start celexa back up 20mg  1/2 tab for 7 days then one tab. Was controlled on 40mg  before. Follow up in 4-6 weeks and can consider going up to 40mg . Refilled xanax only use as needed.

## 2012-07-18 ENCOUNTER — Telehealth: Payer: Self-pay | Admitting: Physician Assistant

## 2012-07-18 MED ORDER — PREDNISONE 20 MG PO TABS: 20.0000 mg | ORAL_TABLET | Freq: Every day | ORAL | Status: DC

## 2012-07-18 NOTE — Telephone Encounter (Signed)
Pt called and not better.   Will send steroid. Call if not improving.

## 2012-07-27 ENCOUNTER — Other Ambulatory Visit: Payer: Self-pay | Admitting: Physician Assistant

## 2012-08-02 ENCOUNTER — Other Ambulatory Visit: Payer: Self-pay | Admitting: *Deleted

## 2012-08-02 MED ORDER — ZOLPIDEM TARTRATE ER 12.5 MG PO TBCR: 12.5000 mg | EXTENDED_RELEASE_TABLET | Freq: Every day | ORAL | Status: DC

## 2012-08-06 ENCOUNTER — Other Ambulatory Visit: Payer: Self-pay | Admitting: *Deleted

## 2012-08-06 MED ORDER — ZOLPIDEM TARTRATE ER 12.5 MG PO TBCR: 12.5000 mg | EXTENDED_RELEASE_TABLET | Freq: Every day | ORAL | Status: DC

## 2012-08-06 NOTE — Telephone Encounter (Signed)
Resent ambien to [facility].

## 2012-08-22 ENCOUNTER — Other Ambulatory Visit: Payer: Self-pay | Admitting: *Deleted

## 2012-08-22 MED ORDER — ALPRAZOLAM 1 MG PO TABS: ORAL_TABLET | ORAL | Status: DC

## 2012-09-05 ENCOUNTER — Other Ambulatory Visit: Payer: Self-pay | Admitting: Physician Assistant

## 2012-09-13 ENCOUNTER — Ambulatory Visit (INDEPENDENT_AMBULATORY_CARE_PROVIDER_SITE_OTHER): Payer: 59 | Admitting: Physician Assistant

## 2012-09-13 ENCOUNTER — Encounter: Payer: Self-pay | Admitting: Physician Assistant

## 2012-09-13 VITALS — BP 131/84 | HR 89 | Wt 128.0 lb

## 2012-09-13 DIAGNOSIS — F430 Acute stress reaction: Secondary | ICD-10-CM

## 2012-09-13 DIAGNOSIS — F3289 Other specified depressive episodes: Secondary | ICD-10-CM

## 2012-09-13 DIAGNOSIS — F411 Generalized anxiety disorder: Secondary | ICD-10-CM

## 2012-09-13 DIAGNOSIS — F329 Major depressive disorder, single episode, unspecified: Secondary | ICD-10-CM

## 2012-09-13 DIAGNOSIS — G4700 Insomnia, unspecified: Secondary | ICD-10-CM

## 2012-09-13 MED ORDER — CITALOPRAM HYDROBROMIDE 40 MG PO TABS: 40.0000 mg | ORAL_TABLET | Freq: Every day | ORAL | Status: DC

## 2012-09-13 MED ORDER — ALPRAZOLAM 1 MG PO TABS: ORAL_TABLET | ORAL | Status: DC

## 2012-09-13 MED ORDER — ESZOPICLONE 3 MG PO TABS: 3.0000 mg | ORAL_TABLET | Freq: Every day | ORAL | Status: DC
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
## 2012-09-13 NOTE — Patient Instructions (Addendum)
Valarian root 300mg .   Stop ambien and start lunesta.   Increase celexa to 40mg .

## 2012-10-08 ENCOUNTER — Other Ambulatory Visit: Payer: Self-pay | Admitting: Physician Assistant

## 2012-10-08 ENCOUNTER — Telehealth: Payer: Self-pay | Admitting: *Deleted

## 2012-10-08 MED ORDER — RAMELTEON 8 MG PO TABS: 8.0000 mg | ORAL_TABLET | Freq: Every day | ORAL | Status: DC

## 2012-10-08 NOTE — Telephone Encounter (Signed)
sent 

## 2012-10-08 NOTE — Telephone Encounter (Signed)
Have you ever tried Rozerem(Ramelteon) it is another sleep hypnotic. Would you like to try?

## 2012-10-08 NOTE — Telephone Encounter (Signed)
Pt calls and states that the Alfonso Patten is not working and would like to be put back on the Ambien as it worked better than the [employer]. Please send to [facility].  States is going out of town to [place] and when returns will make appt with you. Barry Dienes, LPN

## 2012-10-08 NOTE — Telephone Encounter (Signed)
Pt states that she has never tried that and ok to send to her pharmacy. Barry Dienes, LPN

## 2012-11-05 ENCOUNTER — Other Ambulatory Visit: Payer: Self-pay | Admitting: Physician Assistant

## 2012-11-16 ENCOUNTER — Other Ambulatory Visit: Payer: Self-pay | Admitting: Physician Assistant

## 2012-12-10 ENCOUNTER — Other Ambulatory Visit: Payer: Self-pay | Admitting: Physician Assistant

## 2012-12-10 NOTE — Telephone Encounter (Signed)
Needs appt

## 2012-12-14 ENCOUNTER — Ambulatory Visit: Payer: 59 | Admitting: Physician Assistant

## 2012-12-14 ENCOUNTER — Encounter: Payer: Self-pay | Admitting: Physician Assistant

## 2012-12-14 ENCOUNTER — Ambulatory Visit (INDEPENDENT_AMBULATORY_CARE_PROVIDER_SITE_OTHER): Payer: 59 | Admitting: Physician Assistant

## 2012-12-14 VITALS — BP 124/72 | HR 106 | Wt 130.0 lb

## 2012-12-14 DIAGNOSIS — G4700 Insomnia, unspecified: Secondary | ICD-10-CM

## 2012-12-14 DIAGNOSIS — F411 Generalized anxiety disorder: Secondary | ICD-10-CM

## 2012-12-14 DIAGNOSIS — F4310 Post-traumatic stress disorder, unspecified: Secondary | ICD-10-CM

## 2012-12-14 MED ORDER — AMITRIPTYLINE HCL 25 MG PO TABS: 25.0000 mg | ORAL_TABLET | Freq: Every day | ORAL | Status: DC

## 2012-12-14 MED ORDER — ALPRAZOLAM 1 MG PO TABS: 1.0000 mg | ORAL_TABLET | Freq: Two times a day (BID) | ORAL | Status: DC | PRN
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
## 2012-12-20 ENCOUNTER — Ambulatory Visit (INDEPENDENT_AMBULATORY_CARE_PROVIDER_SITE_OTHER): Payer: 59 | Admitting: Psychology

## 2012-12-20 DIAGNOSIS — F411 Generalized anxiety disorder: Secondary | ICD-10-CM

## 2012-12-21 ENCOUNTER — Other Ambulatory Visit (INDEPENDENT_AMBULATORY_CARE_PROVIDER_SITE_OTHER): Payer: 59 | Admitting: Psychology

## 2012-12-21 DIAGNOSIS — F411 Generalized anxiety disorder: Secondary | ICD-10-CM

## 2013-01-03 ENCOUNTER — Ambulatory Visit (HOSPITAL_COMMUNITY): Payer: 59 | Admitting: Psychiatry

## 2013-01-04 ENCOUNTER — Ambulatory Visit (INDEPENDENT_AMBULATORY_CARE_PROVIDER_SITE_OTHER): Payer: 59 | Admitting: Psychology

## 2013-01-04 DIAGNOSIS — F411 Generalized anxiety disorder: Secondary | ICD-10-CM

## 2013-01-11 ENCOUNTER — Ambulatory Visit: Payer: 59 | Admitting: Psychology

## 2013-01-11 ENCOUNTER — Ambulatory Visit (HOSPITAL_COMMUNITY): Payer: 59 | Admitting: Psychiatry

## 2013-01-15 ENCOUNTER — Other Ambulatory Visit: Payer: Self-pay | Admitting: Family Medicine

## 2013-01-18 ENCOUNTER — Ambulatory Visit (INDEPENDENT_AMBULATORY_CARE_PROVIDER_SITE_OTHER): Payer: 59 | Admitting: Psychology

## 2013-01-18 DIAGNOSIS — F411 Generalized anxiety disorder: Secondary | ICD-10-CM

## 2013-01-27 ENCOUNTER — Emergency Department
Admission: EM | Admit: 2013-01-27 | Discharge: 2013-01-27 | Disposition: A | Discharge status: Home / Self Care | Payer: 59 | Source: Home / Self Care | Attending: Family Medicine | Admitting: Family Medicine

## 2013-01-27 ENCOUNTER — Emergency Department (INDEPENDENT_AMBULATORY_CARE_PROVIDER_SITE_OTHER): Payer: 59

## 2013-01-27 ENCOUNTER — Encounter: Payer: Self-pay | Admitting: Emergency Medicine

## 2013-01-27 DIAGNOSIS — R202 Paresthesia of skin: Secondary | ICD-10-CM

## 2013-01-27 DIAGNOSIS — S139XXA Sprain of joints and ligaments of unspecified parts of neck, initial encounter: Secondary | ICD-10-CM

## 2013-01-27 DIAGNOSIS — R209 Unspecified disturbances of skin sensation: Secondary | ICD-10-CM

## 2013-01-27 DIAGNOSIS — M5030 Other cervical disc degeneration, unspecified cervical region: Secondary | ICD-10-CM

## 2013-01-27 MED ORDER — PREDNISONE 20 MG PO TABS: 20.0000 mg | ORAL_TABLET | Freq: Two times a day (BID) | ORAL | Status: DC

## 2013-01-27 MED ORDER — METAXALONE 800 MG PO TABS: ORAL_TABLET | ORAL | Status: DC

## 2013-01-27 NOTE — ED Notes (Signed)
States pain in neck began last evening; no known trauma; did do some painting yesterday. Took ibuprofen 800mg  at 0800 today.

## 2013-01-27 NOTE — ED Provider Notes (Signed)
CSN: [id number]     Arrival date & time 01/27/13  1105 History   First MD Initiated Contact with Patient 01/27/13 1131     Chief Complaint  Patient presents with  . Neck Pain    HPI Comments: Patient states that she slipped on her stairs several days ago; she recalls that she was holding onto the stair rail with her left hand as she fell.  She was sore afterwards but improved, then last night developed increased stiffness in her left neck.  She also has developed some intermittent tingling in her left arm and hand. She admits that she has had occasional intermittent tingling in both hands over the past several weeks.  The history is provided by the patient.    Past Medical History  Diagnosis Date  . Anxiety   . Headache(784.0)   . IBS (irritable bowel syndrome)     questionable, per pt; saw Dr. Loreta Ave  . IC (interstitial cystitis)   . Insomnia    Past Surgical History  Procedure Laterality Date  . Cesarean section    . Pelvic laparoscopy  2003    endometriosis  . Ovarian cyst removal     Family History  Problem Relation Age of Onset  . Heart attack Father    History  Substance Use Topics  . Smoking status: Former Smoker -- 0.30 packs/day    Types: Cigarettes  . Smokeless tobacco: Never Used  . Alcohol Use: Yes     Comment: drinks- 4 q wk/ smokes 2 cigs per day- quiting 04/05/11   OB History   Grav Para Term Preterm Abortions TAB SAB Ect Mult Living                 Review of Systems  Constitutional: Negative.   HENT: Negative.   Eyes: Negative.   Respiratory: Negative.   Cardiovascular: Negative.   Gastrointestinal: Negative.   Genitourinary: Negative.   Musculoskeletal:       Left neck stiffness  Skin: Negative.   Neurological: Positive for numbness. Negative for weakness and headaches.    Allergies  Eggs or egg-derived products  Home Medications   Current Outpatient Rx  Name  Route  Sig  Dispense  Refill  . EXPIRED: albuterol (PROVENTIL HFA;VENTOLIN  HFA) 108 (90 BASE) MCG/ACT inhaler   Inhalation   Inhale 2 puffs into the lungs every 4 (four) hours as needed for wheezing.   1 Inhaler   0   . ALPRAZolam (XANAX) 1 MG tablet   Oral   Take 1 tablet (1 mg total) by mouth 2 (two) times daily as needed for sleep.   60 tablet   3   . amitriptyline (ELAVIL) 25 MG tablet   Oral   Take 1 tablet (25 mg total) by mouth at bedtime. Increase to 50 mg after 1 week.   60 tablet   1   . cetirizine (ZYRTEC) 10 MG tablet   Oral   Take 1 tablet (10 mg total) by mouth daily.   30 tablet      . citalopram (CELEXA) 40 MG tablet   Oral   Take 1 tablet (40 mg total) by mouth daily.   30 tablet   2   . Eszopiclone 3 MG TABS   Oral   Take 1 tablet (3 mg total) by mouth at bedtime. Take immediately before bedtime   30 tablet   2   . metaxalone (SKELAXIN) 800 MG tablet      Take one tab  by mouth at bedtime   10 tablet   0   . pentosan polysulfate (ELMIRON) 100 MG capsule      Take 2 tablets by mouth twice daily          . predniSONE (DELTASONE) 20 MG tablet   Oral   Take 1 tablet (20 mg total) by mouth 2 (two) times daily.   10 tablet   0   . promethazine (PHENERGAN) 25 MG tablet   Oral   Take 1 tablet (25 mg total) by mouth every 6 (six) hours as needed for nausea.   30 tablet   0   . rizatriptan (MAXALT-MLT) 5 MG disintegrating tablet   Oral   Take 1 tablet (5 mg total) by mouth as needed. May repeat in 2 hours if needed.  Max 2 tablet/24 hour   10 tablet   1   . ROZEREM 8 MG tablet      TAKE 1 TABLET BY MOUTH AT BEDTIME.   30 tablet   PRN     Dispense as written.    BP 100/69  Pulse 97  Temp(Src) 98.3 F (36.8 C) (Oral)  Resp 16  Ht 5\' 3"  (1.6 m)  Wt 131 lb (59.421 kg)  BMI 23.21 kg/m2  SpO2 97%  LMP 01/27/2013 Physical Exam  Nursing note and vitals reviewed. Constitutional: She is oriented to person, place, and time. She appears well-developed and well-nourished. No distress.  HENT:  Head:  Normocephalic.  Mouth/Throat: Oropharynx is clear and moist.  Eyes: Conjunctivae are normal. Pupils are equal, round, and reactive to light.  Neck: Muscular tenderness present. Decreased range of motion present.    Patient has decreased motion with rotation to the left.  There is tenderness over the left sternocleidomastoid muscle and trapezeius muscle, extending to left rhomboid muscles along medial edge of left scapula.  Cardiovascular: Normal heart sounds.   Pulmonary/Chest: Breath sounds normal.  Musculoskeletal:  Left shoulder has full range of motion although there is tenderness over the insertion of the biceps tendons  Lymphadenopathy:    She has no cervical adenopathy.  Neurological: She is alert and oriented to person, place, and time.  Reflex Scores:      Bicep reflexes are 2+ on the left side. There is normal strength and sensation in the left upper extremity  Skin: Skin is warm and dry. No rash noted.    ED Course  Procedures  none   Imaging Review Dg Cervical Spine Complete  01/27/2013   *RADIOLOGY REPORT*  Clinical Data: Recent fall, neck pain  CERVICAL SPINE - COMPLETE 4+ VIEW  Comparison: Brain MRI 05/10/2006  Findings: Frontal, lateral and bilateral oblique radiographs of the cervical spine demonstrate no acute fracture or malalignment.  No prevertebral soft tissue swelling.  Mild cervical spondylosis with disc space narrowing at the C5-C6 and C6-C7.  Query mild right foraminal narrowing at C4-C5 and C5-C6 and left foraminal stenosis at C5-C6 and C6-C7. Bony mineralization is within normal limits. Unremarkable visualized lung apices.  IMPRESSION:  1.  No acute fracture, malalignment or soft tissue swelling. 2.  Mild mid-cervical degenerative disc disease with probable mild - moderate bilateral foraminal narrowing as above.   Original Report Authenticated By: Malachy Moan, M.D.    MDM   1. Cervical sprain, initial encounter   2. Paresthesias in left hand; suspect  cervical radiculopathy  3. Paresthesias in right hand    Begin prednisone burst.  Skelaxin at bedtime. Apply ice pack 3 to 4 times  daily for 2 to 3 days.  Followup with Family Doctor in about one week    Lattie Haw, MD 01/28/13 1135

## 2013-02-01 ENCOUNTER — Encounter: Payer: Self-pay | Admitting: Physician Assistant

## 2013-02-01 ENCOUNTER — Ambulatory Visit: Payer: 59 | Admitting: Psychology

## 2013-02-01 ENCOUNTER — Ambulatory Visit (INDEPENDENT_AMBULATORY_CARE_PROVIDER_SITE_OTHER): Payer: 59 | Admitting: Physician Assistant

## 2013-02-01 VITALS — BP 115/73 | HR 86 | Wt 134.0 lb

## 2013-02-01 DIAGNOSIS — M5030 Other cervical disc degeneration, unspecified cervical region: Secondary | ICD-10-CM

## 2013-02-01 DIAGNOSIS — R202 Paresthesia of skin: Secondary | ICD-10-CM

## 2013-02-01 DIAGNOSIS — S139XXS Sprain of joints and ligaments of unspecified parts of neck, sequela: Secondary | ICD-10-CM

## 2013-02-01 DIAGNOSIS — M542 Cervicalgia: Secondary | ICD-10-CM

## 2013-02-01 DIAGNOSIS — R209 Unspecified disturbances of skin sensation: Secondary | ICD-10-CM

## 2013-02-01 MED ORDER — GABAPENTIN 100 MG PO CAPS: 100.0000 mg | ORAL_CAPSULE | Freq: Three times a day (TID) | ORAL | Status: DC

## 2013-02-01 MED ORDER — TIZANIDINE HCL 4 MG PO TABS: 4.0000 mg | ORAL_TABLET | Freq: Four times a day (QID) | ORAL | Status: DC | PRN

## 2013-02-01 MED ORDER — AMITRIPTYLINE HCL 50 MG PO TABS: 50.0000 mg | ORAL_TABLET | Freq: Every day | ORAL | Status: DC

## 2013-02-01 MED ORDER — PROMETHAZINE HCL 25 MG PO TABS: 25.0000 mg | ORAL_TABLET | Freq: Four times a day (QID) | ORAL | Status: DC | PRN

## 2013-02-01 MED ORDER — CETIRIZINE HCL 10 MG PO TABS: 10.0000 mg | ORAL_TABLET | Freq: Every day | ORAL | Status: DC
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
## 2013-02-01 NOTE — Patient Instructions (Addendum)
Cervical Radiculopathy  Cervical radiculopathy happens when a nerve in the neck is pinched or bruised by a slipped (herniated) disk or by arthritic changes in the bones of the cervical spine. This can occur due to an injury or as part of the normal aging process. Pressure on the cervical nerves can cause pain or numbness that runs from your neck all the way down into your arm and fingers.  CAUSES   There are many possible causes, including:   Injury.   Muscle tightness in the neck from overuse.   Swollen, painful joints (arthritis).   Breakdown or degeneration in the bones and joints of the spine (spondylosis) due to aging.   Bone spurs that may develop near the cervical nerves.  SYMPTOMS   Symptoms include pain, weakness, or numbness in the affected arm and hand. Pain can be severe or irritating. Symptoms may be worse when extending or turning the neck.  DIAGNOSIS   Your caregiver will ask about your symptoms and do a physical exam. He or she may test your strength and reflexes. X-rays, CT scans, and MRI scans may be needed in cases of injury or if the symptoms do not go away after a period of time. Electromyography (EMG) or nerve conduction testing may be done to study how your nerves and muscles are working.  TREATMENT   Your caregiver may recommend certain exercises to help relieve your symptoms. Cervical radiculopathy can, and often does, get better with time and treatment. If your problems continue, treatment options may include:   Wearing a soft collar for short periods of time.   Physical therapy to strengthen the neck muscles.   Medicines, such as nonsteroidal anti-inflammatory drugs (NSAIDs), oral corticosteroids, or spinal injections.   Surgery. Different types of surgery may be done depending on the cause of your problems.  HOME CARE INSTRUCTIONS    Put ice on the affected area.   Put ice in a plastic bag.   Place a towel between your skin and the bag.    Leave the ice on for 15-20 minutes, 3-4 times a day or as directed by your caregiver.   If ice does not help, you can try using heat. Take a warm shower or bath, or use a hot water bottle as directed by your caregiver.   You may try a gentle neck and shoulder massage.   Use a flat pillow when you sleep.   Only take over-the-counter or prescription medicines for pain, discomfort, or fever as directed by your caregiver.   If physical therapy was prescribed, follow your caregiver's directions.   If a soft collar was prescribed, use it as directed.  SEEK IMMEDIATE MEDICAL CARE IF:    Your pain gets much worse and cannot be controlled with medicines.   You have weakness or numbness in your hand, arm, face, or leg.   You have a high fever or a stiff, rigid neck.   You lose bowel or bladder control (incontinence).   You have trouble with walking, balance, or speaking.  MAKE SURE YOU:    Understand these instructions.   Will watch your condition.   Will get help right away if you are not doing well or get worse.  Document Released: 01/11/2001 Document Revised: 07/11/2011 Document Reviewed: 11/30/2010  [employer] Patient Information 2014 [employer].

## 2013-02-06 ENCOUNTER — Telehealth: Payer: Self-pay | Admitting: *Deleted

## 2013-02-06 MED ORDER — LINACLOTIDE 145 MCG PO CAPS: 145.0000 ug | ORAL_CAPSULE | Freq: Every day | ORAL | Status: DC

## 2013-02-06 NOTE — Telephone Encounter (Signed)
LMOM.  Misty Ahmad, LPN  

## 2013-02-06 NOTE — Telephone Encounter (Signed)
Ok to send linzess 145 daily to pharmacy with 2 refills. She may need to pick up card to discount price.

## 2013-02-06 NOTE — Telephone Encounter (Signed)
Pt states you gave her samples of Linzess. She wants to see if you will send an rx to the pharmacy for the medication.

## 2013-02-08 ENCOUNTER — Ambulatory Visit (INDEPENDENT_AMBULATORY_CARE_PROVIDER_SITE_OTHER): Payer: 59 | Admitting: Psychology

## 2013-02-08 DIAGNOSIS — F411 Generalized anxiety disorder: Secondary | ICD-10-CM

## 2013-02-11 ENCOUNTER — Other Ambulatory Visit: Payer: Self-pay | Admitting: Physician Assistant

## 2013-03-08 ENCOUNTER — Ambulatory Visit (INDEPENDENT_AMBULATORY_CARE_PROVIDER_SITE_OTHER): Payer: 59 | Admitting: Psychology

## 2013-03-08 DIAGNOSIS — F411 Generalized anxiety disorder: Secondary | ICD-10-CM

## 2013-03-15 ENCOUNTER — Ambulatory Visit: Payer: 59 | Admitting: Psychology

## 2013-03-18 ENCOUNTER — Other Ambulatory Visit: Payer: Self-pay | Admitting: Physician Assistant

## 2013-03-22 ENCOUNTER — Ambulatory Visit: Payer: 59 | Admitting: Psychology

## 2013-04-01 ENCOUNTER — Encounter: Payer: Self-pay | Admitting: Physician Assistant

## 2013-04-01 ENCOUNTER — Ambulatory Visit (INDEPENDENT_AMBULATORY_CARE_PROVIDER_SITE_OTHER): Payer: 59 | Admitting: Physician Assistant

## 2013-04-01 VITALS — BP 109/67 | HR 98 | Wt 139.0 lb

## 2013-04-01 DIAGNOSIS — N926 Irregular menstruation, unspecified: Secondary | ICD-10-CM

## 2013-04-01 DIAGNOSIS — J069 Acute upper respiratory infection, unspecified: Secondary | ICD-10-CM

## 2013-04-01 DIAGNOSIS — R059 Cough, unspecified: Secondary | ICD-10-CM

## 2013-04-01 DIAGNOSIS — Z3201 Encounter for pregnancy test, result positive: Secondary | ICD-10-CM

## 2013-04-01 DIAGNOSIS — R05 Cough: Secondary | ICD-10-CM

## 2013-04-01 DIAGNOSIS — Z3490 Encounter for supervision of normal pregnancy, unspecified, unspecified trimester: Secondary | ICD-10-CM

## 2013-04-01 DIAGNOSIS — Z331 Pregnant state, incidental: Secondary | ICD-10-CM

## 2013-04-01 MED ORDER — AZITHROMYCIN 250 MG PO TABS: ORAL_TABLET | ORAL | Status: DC

## 2013-04-01 MED ORDER — ONDANSETRON HCL 4 MG PO TABS: 4.0000 mg | ORAL_TABLET | Freq: Three times a day (TID) | ORAL | Status: DC | PRN

## 2013-04-01 MED ORDER — ZOLPIDEM TARTRATE 10 MG PO TABS: 10.0000 mg | ORAL_TABLET | Freq: Every evening | ORAL | Status: DC | PRN

## 2013-04-01 NOTE — Progress Notes (Signed)
   Subjective:    Patient ID: [name], female    DOB: 10-13-1975, 37 y.o.   MRN: [id number]  HPI  Patient is a 37 year old female who presents to the clinic with possible pregnancy as well as upper respiratory symptoms.  Patient's last period was October 24. She is back with her ex-husband and not using birth control. She has noticed for the last 4 days she is very nauseated. On her way home from [place] she was try heating. This was not a planned pregnancy.  Patient has had sinus pressure, sore throat, body aches, headache for the last 5 days. She is just traveled back down from [place] and there was a good weather change. She denies any fever, shortness of breath, wheezing. She has tried over-the-counter Mucinex with no relief. She has also had a cough that has started to become productive with whitish to green mucus. She seems to be getting worse the longer she holds out. Her ears feel congested..    Review of Systems     Objective:   Physical Exam  Constitutional: She is oriented to person, place, and time. She appears well-developed and well-nourished.  HENT:  Head: Normocephalic and atraumatic.  Right Ear: External ear normal.  Left Ear: External ear normal.  TMs clear bilaterally.  Oropharynx erythematous with no exudate or swollen tonsils.  Maxillary and frontal sinuses both tender to palpation.  Bilateral nasal turbinates red and swollen.  Eyes: Conjunctivae are normal.  Neck: Normal range of motion. Neck supple.  Bilateral anterior cervical adenopathy.  Cardiovascular: Normal rate, regular rhythm and normal heart sounds.   Pulmonary/Chest: Effort normal and breath sounds normal. She has no wheezes.  Neurological: She is alert and oriented to person, place, and time.  Skin: Skin is warm and dry.  Psychiatric: She has a normal mood and affect. Her behavior is normal.          Assessment & Plan:  Morning sickness/pregnancy- UPT positive.  Pt approximately 5 weeks. discuss with patient that she needed to get on a prenatal vitamin with DHA. She was given Zofran for morning sickness. She was instructed to, next couple weeks to make appointment with OB to have first ultrasound. She was told not to take Xanax any more. She will continue on Ambien for sleep, L. Maran for interstitial cystitis, and Zyrtec for allergies and Elavil for anxiety.  URI/cough-treated with azithromycin. Discussed symptomatic care with patient. Call if not improving.  Spent 30 minutes with patient and greater than 50 percent of visit spent discussing pregnancy/OB/medications.
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
## 2013-04-05 ENCOUNTER — Ambulatory Visit (INDEPENDENT_AMBULATORY_CARE_PROVIDER_SITE_OTHER): Payer: 59 | Admitting: Psychology

## 2013-04-05 DIAGNOSIS — F411 Generalized anxiety disorder: Secondary | ICD-10-CM

## 2013-04-12 ENCOUNTER — Ambulatory Visit: Payer: 59 | Admitting: Psychology

## 2013-04-19 ENCOUNTER — Ambulatory Visit (INDEPENDENT_AMBULATORY_CARE_PROVIDER_SITE_OTHER): Payer: 59 | Admitting: Psychology

## 2013-04-19 DIAGNOSIS — F411 Generalized anxiety disorder: Secondary | ICD-10-CM

## 2013-04-22 ENCOUNTER — Other Ambulatory Visit: Payer: Self-pay | Admitting: Physician Assistant

## 2013-04-26 ENCOUNTER — Ambulatory Visit: Payer: 59 | Admitting: Psychology

## 2013-04-26 ENCOUNTER — Telehealth: Payer: Self-pay | Admitting: *Deleted

## 2013-04-26 NOTE — Telephone Encounter (Signed)
Left detailed vm °

## 2013-04-26 NOTE — Telephone Encounter (Signed)
Call pt: she can just stop elavil.

## 2013-04-26 NOTE — Telephone Encounter (Signed)
Pt called stating that her OB wants her to stop the Elavil. They started her on Buspar 10mg  bid.  She wants to know if she needs to taper down or can she just stop cold [place]? Please advise.

## 2013-04-29 ENCOUNTER — Other Ambulatory Visit: Payer: Self-pay | Admitting: *Deleted

## 2013-04-29 MED ORDER — ZOLPIDEM TARTRATE 10 MG PO TABS: 10.0000 mg | ORAL_TABLET | Freq: Every evening | ORAL | Status: DC | PRN

## 2013-05-03 ENCOUNTER — Ambulatory Visit (INDEPENDENT_AMBULATORY_CARE_PROVIDER_SITE_OTHER): Payer: 59 | Admitting: Psychology

## 2013-05-03 DIAGNOSIS — F411 Generalized anxiety disorder: Secondary | ICD-10-CM

## 2013-05-06 ENCOUNTER — Other Ambulatory Visit: Payer: Self-pay | Admitting: Obstetrics and Gynecology

## 2013-05-07 ENCOUNTER — Encounter (HOSPITAL_COMMUNITY): Payer: Self-pay | Admitting: Anesthesiology

## 2013-05-07 ENCOUNTER — Other Ambulatory Visit: Payer: Self-pay | Admitting: *Deleted

## 2013-05-07 ENCOUNTER — Ambulatory Visit (HOSPITAL_COMMUNITY)
Admission: RE | Admit: 2013-05-07 | Discharge: 2013-05-07 | Disposition: A | Discharge status: Home / Self Care | Payer: 59 | Source: Ambulatory Visit | Attending: Obstetrics and Gynecology | Admitting: Obstetrics and Gynecology

## 2013-05-07 ENCOUNTER — Encounter (HOSPITAL_COMMUNITY): Payer: 59 | Admitting: Anesthesiology

## 2013-05-07 ENCOUNTER — Telehealth: Payer: Self-pay | Admitting: *Deleted

## 2013-05-07 ENCOUNTER — Encounter (HOSPITAL_COMMUNITY)
Admission: RE | Disposition: A | Discharge status: Home / Self Care | Payer: Self-pay | Source: Ambulatory Visit | Attending: Obstetrics and Gynecology

## 2013-05-07 ENCOUNTER — Ambulatory Visit (HOSPITAL_COMMUNITY): Payer: 59 | Admitting: Anesthesiology

## 2013-05-07 DIAGNOSIS — O021 Missed abortion: Secondary | ICD-10-CM | POA: Insufficient documentation

## 2013-05-07 HISTORY — PX: DILATION AND EVACUATION: SHX1459

## 2013-05-07 LAB — CBC WITH DIFFERENTIAL/PLATELET
BASOS ABS: 0 10*3/uL (ref 0.0–0.1)
BASOS PCT: 0 % (ref 0–1)
Eosinophils Absolute: 0.2 10*3/uL (ref 0.0–0.7)
Eosinophils Relative: 2 % (ref 0–5)
HCT: 43.9 % (ref 36.0–46.0)
HEMOGLOBIN: 15.4 g/dL — AB (ref 12.0–15.0)
Lymphocytes Relative: 30 % (ref 12–46)
Lymphs Abs: 2.8 10*3/uL (ref 0.7–4.0)
MCH: 32.6 pg (ref 26.0–34.0)
MCHC: 35.1 g/dL (ref 30.0–36.0)
MCV: 93 fL (ref 78.0–100.0)
MONOS PCT: 8 % (ref 3–12)
Monocytes Absolute: 0.8 10*3/uL (ref 0.1–1.0)
NEUTROS PCT: 59 % (ref 43–77)
Neutro Abs: 5.5 10*3/uL (ref 1.7–7.7)
Platelets: 248 10*3/uL (ref 150–400)
RBC: 4.72 MIL/uL (ref 3.87–5.11)
RDW: 12.2 % (ref 11.5–15.5)
WBC: 9.3 10*3/uL (ref 4.0–10.5)

## 2013-05-07 SURGERY — DILATION AND EVACUATION, UTERUS
Anesthesia: Monitor Anesthesia Care

## 2013-05-07 MED ORDER — ONDANSETRON HCL 4 MG/2ML IJ SOLN
INTRAMUSCULAR | Status: DC | PRN
  Administered 2013-05-07: 4 mg via INTRAVENOUS

## 2013-05-07 MED ORDER — KETOROLAC TROMETHAMINE 30 MG/ML IJ SOLN
INTRAMUSCULAR | Status: DC | PRN
  Administered 2013-05-07: 30 mg via INTRAVENOUS

## 2013-05-07 MED ORDER — ETOMIDATE 2 MG/ML IV SOLN
INTRAVENOUS | Status: AC
  Filled 2013-05-07: qty 10

## 2013-05-07 MED ORDER — FENTANYL CITRATE 0.05 MG/ML IJ SOLN
INTRAMUSCULAR | Status: DC | PRN
  Administered 2013-05-07 (×2): 50 ug via INTRAVENOUS

## 2013-05-07 MED ORDER — MEPERIDINE HCL 25 MG/ML IJ SOLN: 6.2500 mg | INTRAMUSCULAR | Status: DC | PRN

## 2013-05-07 MED ORDER — KETOROLAC TROMETHAMINE 30 MG/ML IJ SOLN
INTRAMUSCULAR | Status: AC
  Filled 2013-05-07: qty 1

## 2013-05-07 MED ORDER — BUPIVACAINE HCL (PF) 0.25 % IJ SOLN
INTRAMUSCULAR | Status: DC | PRN
  Administered 2013-05-07: 10 mL

## 2013-05-07 MED ORDER — ALPRAZOLAM 1 MG PO TABS: 1.0000 mg | ORAL_TABLET | Freq: Two times a day (BID) | ORAL | Status: DC | PRN

## 2013-05-07 MED ORDER — FENTANYL CITRATE 0.05 MG/ML IJ SOLN
INTRAMUSCULAR | Status: AC
  Administered 2013-05-07: 50 ug via INTRAVENOUS
  Filled 2013-05-07: qty 2

## 2013-05-07 MED ORDER — ETOMIDATE 2 MG/ML IV SOLN
INTRAVENOUS | Status: DC | PRN
  Administered 2013-05-07 (×2): 4 mg via INTRAVENOUS
  Administered 2013-05-07 (×2): 2 mg via INTRAVENOUS
  Administered 2013-05-07 (×4): 4 mg via INTRAVENOUS
  Administered 2013-05-07: 2 mg via INTRAVENOUS

## 2013-05-07 MED ORDER — BUPIVACAINE HCL (PF) 0.25 % IJ SOLN
INTRAMUSCULAR | Status: AC
  Filled 2013-05-07: qty 30

## 2013-05-07 MED ORDER — ONDANSETRON HCL 4 MG/2ML IJ SOLN: 4.0000 mg | Freq: Once | INTRAMUSCULAR | Status: DC | PRN

## 2013-05-07 MED ORDER — KETOROLAC TROMETHAMINE 30 MG/ML IJ SOLN: 15.0000 mg | Freq: Once | INTRAMUSCULAR | Status: DC | PRN

## 2013-05-07 MED ORDER — CEFAZOLIN SODIUM 1-5 GM-% IV SOLN
1.0000 g | INTRAVENOUS | Status: DC
  Administered 2013-05-07: 2 g via INTRAVENOUS
  Filled 2013-05-07: qty 50

## 2013-05-07 MED ORDER — LACTATED RINGERS IV SOLN
INTRAVENOUS | Status: DC
  Administered 2013-05-07 (×2): via INTRAVENOUS

## 2013-05-07 MED ORDER — LIDOCAINE HCL (CARDIAC) 20 MG/ML IV SOLN
INTRAVENOUS | Status: AC
  Filled 2013-05-07: qty 5

## 2013-05-07 MED ORDER — LIDOCAINE HCL (CARDIAC) 20 MG/ML IV SOLN
INTRAVENOUS | Status: DC | PRN
  Administered 2013-05-07: 20 mg via INTRAVENOUS

## 2013-05-07 MED ORDER — FENTANYL CITRATE 0.05 MG/ML IJ SOLN
INTRAMUSCULAR | Status: AC
  Filled 2013-05-07: qty 2

## 2013-05-07 MED ORDER — FENTANYL CITRATE 0.05 MG/ML IJ SOLN
25.0000 ug | INTRAMUSCULAR | Status: DC | PRN
  Administered 2013-05-07: 50 ug via INTRAVENOUS
  Administered 2013-05-07: 25 ug via INTRAVENOUS

## 2013-05-07 MED ORDER — MIDAZOLAM HCL 2 MG/2ML IJ SOLN
INTRAMUSCULAR | Status: AC
  Filled 2013-05-07: qty 2

## 2013-05-07 MED ORDER — MIDAZOLAM HCL 2 MG/2ML IJ SOLN
INTRAMUSCULAR | Status: DC | PRN
  Administered 2013-05-07: 2 mg via INTRAVENOUS

## 2013-05-07 MED ORDER — OXYCODONE-ACETAMINOPHEN 5-325 MG PO TABS: 1.0000 | ORAL_TABLET | ORAL | Status: DC | PRN

## 2013-05-07 MED ORDER — ONDANSETRON HCL 4 MG/2ML IJ SOLN
INTRAMUSCULAR | Status: AC
  Filled 2013-05-07: qty 2

## 2013-05-07 MED ORDER — LINACLOTIDE 145 MCG PO CAPS: 145.0000 ug | ORAL_CAPSULE | Freq: Every day | ORAL | Status: DC

## 2013-05-07 MED ORDER — CEFAZOLIN SODIUM-DEXTROSE 2-3 GM-% IV SOLR: 2.0000 g | Freq: Once | INTRAVENOUS | Status: DC

## 2013-05-07 SURGICAL SUPPLY — 18 items
CATH ROBINSON RED A/P 16FR (CATHETERS) ×2 IMPLANT
CLOTH BEACON ORANGE TIMEOUT ST (SAFETY) ×2 IMPLANT
DECANTER SPIKE VIAL GLASS SM (MISCELLANEOUS) ×2 IMPLANT
GLOVE BIO SURGEON STRL SZ7.5 (GLOVE) ×2 IMPLANT
GOWN STRL REIN XL XLG (GOWN DISPOSABLE) ×4 IMPLANT
KIT BERKELEY 1ST TRIMESTER 3/8 (MISCELLANEOUS) ×2 IMPLANT
NEEDLE SPNL 22GX3.5 QUINCKE BK (NEEDLE) ×2 IMPLANT
NS IRRIG 1000ML POUR BTL (IV SOLUTION) ×2 IMPLANT
PACK VAGINAL MINOR WOMEN LF (CUSTOM PROCEDURE TRAY) ×2 IMPLANT
PAD OB MATERNITY 4.3X12.25 (PERSONAL CARE ITEMS) ×2 IMPLANT
PAD PREP 24X48 CUFFED NSTRL (MISCELLANEOUS) ×2 IMPLANT
SET BERKELEY SUCTION TUBING (SUCTIONS) ×2 IMPLANT
SYR CONTROL 10ML LL (SYRINGE) ×2 IMPLANT
TOWEL OR 17X24 6PK STRL BLUE (TOWEL DISPOSABLE) ×4 IMPLANT
VACURETTE 10 RIGID CVD (CANNULA) IMPLANT
VACURETTE 7MM CVD STRL WRAP (CANNULA) IMPLANT
VACURETTE 8 RIGID CVD (CANNULA) ×2 IMPLANT
VACURETTE 9 RIGID CVD (CANNULA) IMPLANT

## 2013-05-07 NOTE — Op Note (Signed)
05/07/2013  1:23 PM  PATIENT:  [name]  38 y.o. female  PRE-OPERATIVE DIAGNOSIS:  Missed Abortion  POST-OPERATIVE DIAGNOSIS:  Same and Recurrent pregnancy loss  PROCEDURE:  Procedure(s): DILATATION AND EVACUATION with tissue sent for chromosome analysis  SURGEON:  Surgeon(s): Lovenia Kim, MD  ASSISTANTS: none   ANESTHESIA:   local and IV sedation  ESTIMATED BLOOD LOSS: minimal  DRAINS: none   LOCAL MEDICATIONS USED:  MARCAINE    and Amount: 20 ml  SPECIMEN:  Source of Specimen:  POC  DISPOSITION OF SPECIMEN:  PATHOLOGY  COUNTS:  YES  DICTATION #: [id number]  PLAN OF CARE: DC home  PATIENT DISPOSITION:  PACU - hemodynamically stable.

## 2013-05-07 NOTE — H&P (Signed)
NAMEMARYAN, SIVAK      ACCOUNT NO.:  [id number]  MEDICAL RECORD NO.:  [id number]  LOCATION:                                 FACILITY:  PHYSICIAN:  Lovenia Kim, M.D.DATE OF BIRTH:  05-11-1975  DATE OF ADMISSION: DATE OF DISCHARGE:                             HISTORY & PHYSICAL   CHIEF COMPLAINT:  Missed AB.  HISTORY OF PRESENT ILLNESS:  A 38 year old white female, G4, P2, history of SAB x1, history of two C-sections, she presents with missed AB at 9 weeks.  MEDICATIONS:  Include BuSpar, Ambien, prenatal vitamins.  SOCIAL HISTORY:  Nonsmoker, nondrinker.  Denies domestic or physical violence.  FAMILY HISTORY:  Hypertension, diabetes, breast cancer, heart disease, myocardial infarction, history of laparoscopy in 2004 in addition to C- section x2.  PHYSICAL EXAMINATION:  GENERAL:  A well-developed, well-nourished white female, in no acute distress. HEENT:  Normal. NECK:  Supple.  Full range of motion. LUNGS:  Clear. HEART:  Regular rate and rhythm. ABDOMEN:  Soft, gravid, nontender.  Uterus, 8-10 weeks size.  No adnexal masses. EXTREMITIES:  There are no cords. NEUROLOGIC:  Nonfocal. SKIN:  Intact.  IMPRESSION:  Missed abortion at 9 weeks.  PLAN:  Proceed with suction D & E.  Risks of anesthesia, infection, bleeding, injury to surrounding organs, need for repair was discussed, delayed versus immediate complications to include bowel and bladder injury noted.  The patient acknowledges and wishes to proceed.     Lovenia Kim, M.D.     RJT/MEDQ  D:  05/06/2013  T:  05/06/2013  Job:  [id number]

## 2013-05-07 NOTE — Telephone Encounter (Signed)
Pt called to say that she had a miscarriage & is going to a D&C today.  They are thinking it's a chromosomal issue & are going to run tests.  She is asking to get back on her prepregnancy meds elevil, xanax, & linzess.  I advised her that she should still have refills left on the elevil.  I sent over the linzess & printed the xanax for you to sign.

## 2013-05-07 NOTE — Transfer of Care (Signed)
Immediate Anesthesia Transfer of Care Note  Patient: [name]  Procedure(s) Performed: Procedure(s): DILATATION AND EVACUATION with tissue sent for chromosome analysis (N/A)  Patient Location: PACU  Anesthesia Type:MAC  Level of Consciousness: awake, oriented and pateint uncooperative  Airway & Oxygen Therapy: Patient Spontanous Breathing  Post-op Assessment: Report given to PACU RN and Post -op Vital signs reviewed and stable  Post vital signs: Reviewed and stable  Complications: No apparent anesthesia complications

## 2013-05-07 NOTE — Anesthesia Preprocedure Evaluation (Signed)
Anesthesia Evaluation    Reviewed: Allergy & Precautions, H&P , NPO status , Patient's Chart, lab work & pertinent test results  Airway Mallampati: I TM Distance: >3 FB Neck ROM: full    Dental no notable dental hx. (+) Teeth Intact   Pulmonary former smoker,          Cardiovascular negative cardio ROS      Neuro/Psych    GI/Hepatic negative GI ROS, Neg liver ROS,   Endo/Other  negative endocrine ROS  Renal/GU negative Renal ROS  negative genitourinary   Musculoskeletal negative musculoskeletal ROS (+)   Abdominal Normal abdominal exam  (+)   Peds  Hematology negative hematology ROS (+)   Anesthesia Other Findings   Reproductive/Obstetrics negative OB ROS                           Anesthesia Physical Anesthesia Plan  ASA: II  Anesthesia Plan: MAC   Post-op Pain Management:    Induction: Intravenous  Airway Management Planned:   Additional Equipment:   Intra-op Plan:   Post-operative Plan:   Informed Consent: I have reviewed the patients History and Physical, chart, labs and discussed the procedure including the risks, benefits and alternatives for the proposed anesthesia with the patient or authorized representative who has indicated his/her understanding and acceptance.     Plan Discussed with: CRNA and Surgeon  Anesthesia Plan Comments:         Anesthesia Quick Evaluation

## 2013-05-07 NOTE — Progress Notes (Signed)
05/07/13 1500  Clinical Encounter Type  Visited With Patient and family together (husband [name])  Visit Type Spiritual support;Social support (pregnancy loss/D&E)  Referral From Nurse (Amy, PACU)  Spiritual Encounters  Spiritual Needs Emotional;Grief support  Stress Factors  Patient Stress Factors Loss;Major life changes (very tearful)  Family Stress Factors Loss;Major life changes   Provided pastoral presence at bedside.  [name] was too tearful to talk.  Also met husband [name], speaking with him briefly about grief and support.  Provided my card and briefly reviewed Comfort Packet to familiarize family with community support resources and encouraged couple to reach out as desired.  Chaplain  , MDiv [phone] 

## 2013-05-07 NOTE — Anesthesia Postprocedure Evaluation (Signed)
Anesthesia Post Note  Patient: [name]  Procedure(s) Performed: Procedure(s) (LRB): DILATATION AND EVACUATION with tissue sent for chromosome analysis (N/A)  Anesthesia type: MAC  Patient location: PACU  Post pain: Pain level controlled  Post assessment: Post-op Vital signs reviewed  Last Vitals:  Filed Vitals:   05/07/13 1330  BP: 140/73  Pulse: 139  Temp: 36.8 C  Resp: 20    Post vital signs: Reviewed  Level of consciousness: sedated  Complications: No apparent anesthesia complications

## 2013-05-07 NOTE — Progress Notes (Signed)
Patient ID: [name], female   DOB: 10-07-1975, 38 y.o.   MRN: [id number] Patient seen and examined. Consent witnessed and signed. No changes noted. Update completed.

## 2013-05-07 NOTE — Discharge Instructions (Signed)

## 2013-05-08 ENCOUNTER — Encounter (HOSPITAL_COMMUNITY): Payer: Self-pay | Admitting: Obstetrics and Gynecology

## 2013-05-08 NOTE — Op Note (Signed)
[name], [name]      ACCOUNT NO.:  [id number]  MEDICAL RECORD NO.:  [id number]  LOCATION:  [facility]                          FACILITY:  [facility]  PHYSICIAN:  Lovenia Kim, M.D.DATE OF BIRTH:  Apr 12, 1976  DATE OF PROCEDURE:  05/07/2013 DATE OF DISCHARGE:  05/07/2013                              OPERATIVE REPORT   PREOPERATIVE DIAGNOSES:  Missed abortion and recurrent pregnancy loss.  POSTOPERATIVE DIAGNOSES:  Missed abortion and recurrent pregnancy loss.  PROCEDURE:  Suction dilation and evacuation.  SURGEON:  Lovenia Kim, MD  ASSISTANT:  None.  ANESTHESIA:  IV sedation and local.  ESTIMATED BLOOD LOSS:  Less than 50 mL.  COMPLICATIONS:  None.  DRAINS:  None.  COUNTS:  Correct.  The patient went to recovery room in good condition.  BRIEF OPERATIVE NOTE:  After being apprised of the risks of anesthesia, infection, bleeding, injury to surrounding organs, possible need for repair, delayed versus immediate complications to include bowel and bladder injury, possible need for repair, the patient was brought to the operating room where she was administered IV sedation without difficulty, prepped and draped in usual sterile fashion, catheterized until the bladder was empty.  Feet were placed in [place]. Exam under anesthesia revealed an 8-10 weeks size anteflexed uterus.  No adnexal masses appreciated.  Cervix was then easily dilated up to a #25 Pratt dilator after dilute paracervical block 20 mL total placed in a standard fashion.  Suction curette 8 mm placed. Products of conception noted, aspirated and inspected.  Repeat suction and curettage bluntly in 4 quadrant method revealed the cavity to be empty.  Good hemostasis was noted.  The patient tolerated the procedure well, was awakened, and transferred to recovery in good condition.  Tissue was sent to Pathology but also for chromosomal analysis and FISH analysis.     Lovenia Kim,  M.D.     RJT/MEDQ  D:  05/07/2013  T:  05/08/2013  Job:  [id number]

## 2013-05-08 NOTE — Telephone Encounter (Signed)
Ok

## 2013-05-13 ENCOUNTER — Other Ambulatory Visit: Payer: Self-pay | Admitting: Physician Assistant

## 2013-05-13 NOTE — Telephone Encounter (Signed)
Amber, didn't you just send xanax to pharmacy for this patient?

## 2013-05-14 ENCOUNTER — Ambulatory Visit (INDEPENDENT_AMBULATORY_CARE_PROVIDER_SITE_OTHER): Payer: 59 | Admitting: Psychology

## 2013-05-14 DIAGNOSIS — F411 Generalized anxiety disorder: Secondary | ICD-10-CM

## 2013-05-17 ENCOUNTER — Ambulatory Visit (INDEPENDENT_AMBULATORY_CARE_PROVIDER_SITE_OTHER): Payer: 59 | Admitting: Psychology

## 2013-05-17 DIAGNOSIS — F411 Generalized anxiety disorder: Secondary | ICD-10-CM

## 2013-05-22 LAB — CHROMOSOME STD, POC(TISSUE)-NCBH

## 2013-05-22 LAB — TISSUE HYBRIDIZATION TO NCBH

## 2013-05-24 ENCOUNTER — Ambulatory Visit (INDEPENDENT_AMBULATORY_CARE_PROVIDER_SITE_OTHER): Payer: 59 | Admitting: Psychology

## 2013-05-24 DIAGNOSIS — F411 Generalized anxiety disorder: Secondary | ICD-10-CM

## 2013-05-29 ENCOUNTER — Ambulatory Visit (INDEPENDENT_AMBULATORY_CARE_PROVIDER_SITE_OTHER): Payer: 59 | Admitting: Psychology

## 2013-05-29 DIAGNOSIS — F411 Generalized anxiety disorder: Secondary | ICD-10-CM

## 2013-05-31 ENCOUNTER — Other Ambulatory Visit: Payer: Self-pay | Admitting: Physician Assistant

## 2013-05-31 ENCOUNTER — Ambulatory Visit: Payer: 59 | Admitting: Psychology

## 2013-05-31 NOTE — Telephone Encounter (Signed)
Not on med list.  Do you want to fill this?

## 2013-06-05 ENCOUNTER — Ambulatory Visit: Payer: 59 | Admitting: Psychology

## 2013-06-07 ENCOUNTER — Ambulatory Visit: Payer: 59 | Admitting: Psychology

## 2013-06-14 ENCOUNTER — Ambulatory Visit (INDEPENDENT_AMBULATORY_CARE_PROVIDER_SITE_OTHER): Payer: 59 | Admitting: Psychology

## 2013-06-14 DIAGNOSIS — F411 Generalized anxiety disorder: Secondary | ICD-10-CM

## 2013-06-19 ENCOUNTER — Ambulatory Visit: Payer: 59 | Admitting: Psychology

## 2013-06-28 ENCOUNTER — Ambulatory Visit: Payer: 59 | Admitting: Psychology

## 2013-07-08 ENCOUNTER — Encounter: Payer: Self-pay | Admitting: Family Medicine

## 2013-07-08 ENCOUNTER — Ambulatory Visit (INDEPENDENT_AMBULATORY_CARE_PROVIDER_SITE_OTHER): Payer: 59 | Admitting: Family Medicine

## 2013-07-08 VITALS — BP 108/68 | HR 90 | Temp 98.0°F | Ht 63.0 in | Wt 139.0 lb

## 2013-07-08 DIAGNOSIS — A084 Viral intestinal infection, unspecified: Secondary | ICD-10-CM

## 2013-07-08 DIAGNOSIS — A088 Other specified intestinal infections: Secondary | ICD-10-CM

## 2013-07-08 MED ORDER — PROMETHAZINE HCL 25 MG PO TABS: 25.0000 mg | ORAL_TABLET | Freq: Four times a day (QID) | ORAL | Status: DC | PRN

## 2013-07-08 NOTE — Progress Notes (Signed)
   Subjective:    Patient ID: [name], female    DOB: October 31, 1975, 38 y.o.   MRN: [id number]  HPI  Diarrhea and nausea started sat night.  Thinks had a low grade fever last night.  No vomiting.  No sick contacts.  No blood in the stool. Hasn't eaten today.  No OTC medications.  + HA.    Review of Systems     Objective:   Physical Exam  Constitutional: She is oriented to person, place, and time. She appears well-developed and well-nourished.  HENT:  Head: Normocephalic and atraumatic.  Right Ear: External ear normal.  Left Ear: External ear normal.  Nose: Nose normal.  Mouth/Throat: Oropharynx is clear and moist.  TMs and canals are clear.   Eyes: Conjunctivae and EOM are normal. Pupils are equal, round, and reactive to light.  Neck: Neck supple. No thyromegaly present.  Cardiovascular: Normal rate, regular rhythm and normal heart sounds.   Pulmonary/Chest: Effort normal and breath sounds normal. She has no wheezes.  Abdominal: Soft. Bowel sounds are normal. She exhibits no distension and no mass. There is no tenderness. There is no rebound and no guarding.  Lymphadenopathy:    She has no cervical adenopathy.  Neurological: She is alert and oriented to person, place, and time.  Skin: Skin is warm and dry.  Psychiatric: She has a normal mood and affect.          Assessment & Plan:  Viral gastroenteritis.- likely viral. Will treat with fluids and phenergan. Work note given. Call if not improving or getting worse.

## 2013-07-08 NOTE — Patient Instructions (Signed)
Viral Gastroenteritis °Viral gastroenteritis is also called stomach flu. This illness is caused by a certain type of germ (virus). It can cause sudden watery poop (diarrhea) and throwing up (vomiting). This can cause you to lose body fluids (dehydration). This illness usually lasts for 3 to 8 days. It usually goes away on its own. °HOME CARE  °· Drink enough fluids to keep your pee (urine) clear or pale yellow. Drink small amounts of fluids often. °· Ask your doctor how to replace body fluid losses (rehydration). °· Avoid: °· Foods high in sugar. °· Alcohol. °· Bubbly (carbonated) drinks. °· Tobacco. °· Juice. °· Caffeine drinks. °· Very hot or cold fluids. °· Fatty, greasy foods. °· Eating too much at one time. °· Dairy products until 24 to 48 hours after your watery poop stops. °· You may eat foods with active cultures (probiotics). They can be found in some yogurts and supplements. °· Wash your hands well to avoid spreading the illness. °· Only take medicines as told by your doctor. Do not give aspirin to children. Do not take medicines for watery poop (antidiarrheals). °· Ask your doctor if you should keep taking your regular medicines. °· Keep all doctor visits as told. °GET HELP RIGHT AWAY IF:  °· You cannot keep fluids down. °· You do not pee at least once every 6 to 8 hours. °· You are short of breath. °· You see blood in your poop or throw up. This may look like coffee grounds. °· You have belly (abdominal) pain that gets worse or is just in one small spot (localized). °· You keep throwing up or having watery poop. °· You have a fever. °· The patient is a child younger than 3 months, and he or she has a fever. °· The patient is a child older than 3 months, and he or she has a fever and problems that do not go away. °· The patient is a child older than 3 months, and he or she has a fever and problems that suddenly get worse. °· The patient is a baby, and he or she has no tears when crying. °MAKE SURE YOU:    °· Understand these instructions. °· Will watch your condition. °· Will get help right away if you are not doing well or get worse. °Document Released: 10/05/2007 Document Revised: 07/11/2011 Document Reviewed: 02/02/2011 °ExitCare® Patient Information ©2014 [employer]. ° °

## 2013-07-09 ENCOUNTER — Telehealth: Payer: Self-pay | Admitting: *Deleted

## 2013-07-09 DIAGNOSIS — R197 Diarrhea, unspecified: Secondary | ICD-10-CM

## 2013-07-09 NOTE — Telephone Encounter (Signed)
Ok to use immodium for the diarhea to slow it down. We can order a CBC and stool culture. labslip printed. Can go anytime.

## 2013-07-09 NOTE — Telephone Encounter (Signed)
Pt calls and states that she is not feeling any better since she seen you on Monday.  Feels worse, has diarrhea, sweats, H/A, sore throat, dry cough, really fatigued.  Was told to call if no better.  Please advise. Clemetine Marker, LPN

## 2013-07-10 NOTE — Telephone Encounter (Signed)
Pt notified and states that the diarrhea has slowed down so advised her to go and get the CBC and would call with results when get back. Clemetine Marker, LPN

## 2013-07-11 ENCOUNTER — Ambulatory Visit (INDEPENDENT_AMBULATORY_CARE_PROVIDER_SITE_OTHER): Payer: 59 | Admitting: Psychology

## 2013-07-11 DIAGNOSIS — F411 Generalized anxiety disorder: Secondary | ICD-10-CM

## 2013-07-11 LAB — CBC WITH DIFFERENTIAL/PLATELET
BASOS ABS: 0.1 10*3/uL (ref 0.0–0.1)
BASOS PCT: 1 % (ref 0–1)
EOS PCT: 5 % (ref 0–5)
Eosinophils Absolute: 0.5 10*3/uL (ref 0.0–0.7)
HEMATOCRIT: 45.6 % (ref 36.0–46.0)
Hemoglobin: 15.6 g/dL — ABNORMAL HIGH (ref 12.0–15.0)
Lymphocytes Relative: 35 % (ref 12–46)
Lymphs Abs: 3.5 10*3/uL (ref 0.7–4.0)
MCH: 32.2 pg (ref 26.0–34.0)
MCHC: 34.2 g/dL (ref 30.0–36.0)
MCV: 94.2 fL (ref 78.0–100.0)
MONO ABS: 0.9 10*3/uL (ref 0.1–1.0)
Monocytes Relative: 9 % (ref 3–12)
Neutro Abs: 5.1 10*3/uL (ref 1.7–7.7)
Neutrophils Relative %: 50 % (ref 43–77)
Platelets: 305 10*3/uL (ref 150–400)
RBC: 4.84 MIL/uL (ref 3.87–5.11)
RDW: 12.8 % (ref 11.5–15.5)
WBC: 10.1 10*3/uL (ref 4.0–10.5)

## 2013-07-22 ENCOUNTER — Other Ambulatory Visit: Payer: Self-pay | Admitting: Physician Assistant

## 2013-08-23 ENCOUNTER — Other Ambulatory Visit: Payer: Self-pay | Admitting: Physician Assistant

## 2013-08-28 ENCOUNTER — Other Ambulatory Visit: Payer: Self-pay | Admitting: *Deleted

## 2013-08-28 MED ORDER — ALPRAZOLAM 1 MG PO TABS: ORAL_TABLET | ORAL | Status: DC

## 2013-09-09 ENCOUNTER — Other Ambulatory Visit: Payer: Self-pay | Admitting: Physician Assistant

## 2013-09-18 ENCOUNTER — Encounter: Payer: Self-pay | Admitting: Physician Assistant

## 2013-09-18 ENCOUNTER — Ambulatory Visit (INDEPENDENT_AMBULATORY_CARE_PROVIDER_SITE_OTHER): Payer: 59 | Admitting: Physician Assistant

## 2013-09-18 VITALS — BP 135/76 | HR 112 | Ht 63.0 in | Wt 138.0 lb

## 2013-09-18 DIAGNOSIS — F329 Major depressive disorder, single episode, unspecified: Secondary | ICD-10-CM

## 2013-09-18 DIAGNOSIS — G4700 Insomnia, unspecified: Secondary | ICD-10-CM

## 2013-09-18 DIAGNOSIS — F4310 Post-traumatic stress disorder, unspecified: Secondary | ICD-10-CM

## 2013-09-18 DIAGNOSIS — F411 Generalized anxiety disorder: Secondary | ICD-10-CM

## 2013-09-18 DIAGNOSIS — F3289 Other specified depressive episodes: Secondary | ICD-10-CM

## 2013-09-18 MED ORDER — CONCEPT DHA 53.5-38-1 MG PO CAPS
ORAL_CAPSULE | ORAL | Status: DC
Start: 2013-09-18 — End: 2013-12-21

## 2013-09-18 MED ORDER — AMITRIPTYLINE HCL 100 MG PO TABS
100.0000 mg | ORAL_TABLET | Freq: Every day | ORAL | Status: DC
Start: 2013-09-18 — End: 2013-12-21

## 2013-09-18 MED ORDER — SERTRALINE HCL 50 MG PO TABS: 50.0000 mg | ORAL_TABLET | Freq: Every day | ORAL | Status: DC
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
## 2013-09-18 NOTE — Patient Instructions (Addendum)
Will refer to sleep therapist.   Increase amitriptyline to 100mg .  Start zoloft daily.

## 2013-09-27 ENCOUNTER — Other Ambulatory Visit: Payer: Self-pay | Admitting: Physician Assistant

## 2013-10-03 ENCOUNTER — Encounter: Payer: Self-pay | Admitting: Family Medicine

## 2013-10-03 ENCOUNTER — Ambulatory Visit (INDEPENDENT_AMBULATORY_CARE_PROVIDER_SITE_OTHER): Payer: 59 | Admitting: Family Medicine

## 2013-10-03 VITALS — BP 126/81 | HR 101 | Temp 97.6°F | Wt 142.0 lb

## 2013-10-03 DIAGNOSIS — N912 Amenorrhea, unspecified: Secondary | ICD-10-CM

## 2013-10-03 DIAGNOSIS — B9789 Other viral agents as the cause of diseases classified elsewhere: Secondary | ICD-10-CM

## 2013-10-03 DIAGNOSIS — J329 Chronic sinusitis, unspecified: Secondary | ICD-10-CM

## 2013-10-03 LAB — HCG, QUANTITATIVE, PREGNANCY: hCG, Beta Chain, Quant, S: <2 m[IU]/mL

## 2013-10-03 NOTE — Progress Notes (Signed)
CC: [name] is a 38 y.o. female is here for Cough, ear itching, Nausea, Nasal Congestion and upset stomach   Subjective: HPI:  Complains of nausea, fatigue, facial pressure localized above the eyes and the forehead, nasal congestion that has been present for the past 2 days worsening on a daily basis. Headache is worse with leaning forward. No interventions as of yet. Reports subjective chills and night sweats last night but no fevers.  Denies cough, shortness of breath, wheezing, sore throat, nor motor or sensory disturbances. No interventions as of yet because she's concerned that she may be pregnant, she anticipates her period either today or tomorrow has been taking Clomid and does not want to interfere with any potential fetus that may be present.    Review Of Systems Outlined In HPI  Past Medical History  Diagnosis Date  . Anxiety   . Headache(784.0)   . IBS (irritable bowel syndrome)     questionable, per pt; saw Dr. Collene Flynn  . IC (interstitial cystitis)   . Insomnia     Past Surgical History  Procedure Laterality Date  . Cesarean section    . Pelvic laparoscopy  2003    endometriosis  . Ovarian cyst removal    . Dilation and evacuation N/A 05/07/2013    Procedure: DILATATION AND EVACUATION with tissue sent for chromosome analysis;  Surgeon: Michele Kim, MD;  Location: [facility];  Service: Gynecology;  Laterality: N/A;   Family History  Problem Relation Age of Onset  . Heart attack Father     History   Social History  . Marital Status: Divorced    Spouse Name: N/A    Number of Children: 2  . Years of Education: N/A   Occupational History  . CMA     [name]   Social History Main Topics  . Smoking status: Former Smoker -- 0.30 packs/day    Types: Cigarettes  . Smokeless tobacco: Never Used  . Alcohol Use: Yes     Comment: drinks- 4 q wk/ smokes 2 cigs per day- quiting 04/05/11  . Drug Use: No  . Sexual Activity: Not on file   Other Topics  Concern  . Not on file   Social History Narrative   Going through separation.  Children [name] and [name]     Objective: BP 126/81  Pulse 101  Temp(Src) 97.6 F (36.4 C)  Wt 142 lb (64.411 kg)  General: Alert and Oriented, No Acute Distress HEENT: Pupils equal, round, reactive to light. Conjunctivae clear.  External ears unremarkable, canals clear with intact TMs with appropriate landmarks.  Left middle ear with mild serous effusion, right middle ear with mild serous effusion. Pink inferior turbinates.  Moist mucous membranes, pharynx without inflammation nor lesions.  Neck supple without palpable lymphadenopathy nor abnormal masses. Lungs: Clear to auscultation bilaterally, no wheezing/ronchi/rales.  Comfortable work of breathing. Good air movement. Extremities: No peripheral edema.  Strong peripheral pulses.  Mental Status: No depression, anxiety, nor agitation. Skin: Warm and dry.  Assessment & Plan: [name] was seen today for cough, ear itching, nausea, nasal congestion and upset stomach.  Diagnoses and associated orders for this visit:  Amenorrhea - B-HCG Quant  Viral sinusitis    I suspect to be too early to rely on a urine pregnancy test therefore getting quantitative beta hCG to help determine if she's pregnant. Since there possibility that she is pregnant we will treat only with nasal corticosteroid, will provide further recommendations if beta hCG is undetectable.  Return if symptoms worsen or fail to improve.

## 2013-10-24 ENCOUNTER — Other Ambulatory Visit: Payer: Self-pay | Admitting: Physician Assistant

## 2013-10-25 NOTE — Telephone Encounter (Signed)
[name] reports she has put a hold on trying to get pregnant. So we sent refill of [name] to pharmacy.

## 2013-10-29 ENCOUNTER — Encounter: Payer: Self-pay | Admitting: Family Medicine

## 2013-10-29 ENCOUNTER — Ambulatory Visit (INDEPENDENT_AMBULATORY_CARE_PROVIDER_SITE_OTHER): Payer: 59 | Admitting: Family Medicine

## 2013-10-29 VITALS — BP 114/80 | HR 105 | Ht 63.0 in | Wt 140.0 lb

## 2013-10-29 DIAGNOSIS — M5412 Radiculopathy, cervical region: Secondary | ICD-10-CM

## 2013-10-29 DIAGNOSIS — M5010 Cervical disc disorder with radiculopathy, unspecified cervical region: Secondary | ICD-10-CM

## 2013-10-29 DIAGNOSIS — M542 Cervicalgia: Secondary | ICD-10-CM

## 2013-10-29 MED ORDER — DICLOFENAC SODIUM 75 MG PO TBEC: 75.0000 mg | DELAYED_RELEASE_TABLET | Freq: Two times a day (BID) | ORAL | Status: DC

## 2013-10-29 MED ORDER — CYCLOBENZAPRINE HCL 10 MG PO TABS: 10.0000 mg | ORAL_TABLET | Freq: Three times a day (TID) | ORAL | Status: DC | PRN

## 2013-10-29 MED ORDER — HYDROCODONE-ACETAMINOPHEN 5-325 MG PO TABS: 1.0000 | ORAL_TABLET | Freq: Four times a day (QID) | ORAL | Status: DC | PRN

## 2013-10-29 NOTE — Patient Instructions (Signed)
You have cervical radiculopathy (a pinched nerve in the neck). Consider Prednisone 6 day dose pack to relieve irritation/inflammation of the nerve. Voltaren 75mg  twice a day with food for pain and inflammation. Flexeril three times a day as needed for muscle spasms (can make you sleepy - if so do not drive while taking this). Norco for severe pain (no driving on this medicine). Consider cervical collar if severely painful. Simple range of motion exercises within limits of pain to prevent further stiffness. Start physical therapy for stretching, exercises, traction, and modalities. Heat 15 minutes at a time 3-4 times a day to help with spasms. Watch head position when on computers, texting, when sleeping in bed - should in line with back to prevent further nerve traction and irritation. Consider home traction unit if you get benefit with this in physical therapy. If not improving we will consider an MRI. Follow up with me in 1 month.

## 2013-10-30 ENCOUNTER — Ambulatory Visit: Payer: 59 | Admitting: Sports Medicine

## 2013-10-30 ENCOUNTER — Other Ambulatory Visit: Payer: Self-pay | Admitting: Physician Assistant

## 2013-10-30 MED ORDER — ALPRAZOLAM 1 MG PO TABS: ORAL_TABLET | ORAL | Status: DC

## 2013-10-31 ENCOUNTER — Encounter: Payer: Self-pay | Admitting: Family Medicine

## 2013-10-31 ENCOUNTER — Telehealth: Payer: Self-pay | Admitting: Family Medicine

## 2013-10-31 ENCOUNTER — Encounter: Payer: 59 | Admitting: Sports Medicine

## 2013-10-31 DIAGNOSIS — M5030 Other cervical disc degeneration, unspecified cervical region: Secondary | ICD-10-CM | POA: Insufficient documentation

## 2013-10-31 NOTE — Telephone Encounter (Signed)
We don't know yet.  She just started treatment.  If over the next couple weeks with therapy and the medications she's not improving would consider prednisone and/or an MRI as we discussed at her visit.
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
## 2013-10-31 NOTE — Telephone Encounter (Signed)
Ok this one time.  She needs to call us on the day she misses work, not after the fact.  And as noted in other telephone message if she's not improving over the next couple weeks we need to move to the next steps - MRI, prednisone dose pack, possible injections.
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
## 2013-11-04 ENCOUNTER — Other Ambulatory Visit: Payer: Self-pay | Admitting: *Deleted

## 2013-11-04 ENCOUNTER — Ambulatory Visit: Payer: 59 | Admitting: Physician Assistant

## 2013-11-04 ENCOUNTER — Telehealth: Payer: Self-pay | Admitting: Family Medicine

## 2013-11-04 DIAGNOSIS — M5020 Other cervical disc displacement, unspecified cervical region: Secondary | ICD-10-CM

## 2013-11-04 NOTE — Telephone Encounter (Signed)
If she would like to consider a shot the next step would be an MRI of her cervical spine - does she want to go ahead with this?

## 2013-11-08 ENCOUNTER — Ambulatory Visit: Payer: 59 | Admitting: Physical Therapy

## 2013-11-08 ENCOUNTER — Telehealth: Payer: Self-pay | Admitting: Family Medicine

## 2013-11-09 ENCOUNTER — Ambulatory Visit (HOSPITAL_BASED_OUTPATIENT_CLINIC_OR_DEPARTMENT_OTHER)
Admission: RE | Admit: 2013-11-09 | Discharge: 2013-11-09 | Disposition: A | Discharge status: Home / Self Care | Payer: 59 | Source: Ambulatory Visit | Attending: Family Medicine | Admitting: Family Medicine

## 2013-11-09 DIAGNOSIS — M5020 Other cervical disc displacement, unspecified cervical region: Secondary | ICD-10-CM | POA: Insufficient documentation

## 2013-11-09 DIAGNOSIS — M25519 Pain in unspecified shoulder: Secondary | ICD-10-CM | POA: Insufficient documentation

## 2013-11-09 DIAGNOSIS — M542 Cervicalgia: Secondary | ICD-10-CM | POA: Insufficient documentation

## 2013-11-09 DIAGNOSIS — R209 Unspecified disturbances of skin sensation: Secondary | ICD-10-CM | POA: Insufficient documentation

## 2013-11-12 MED ORDER — GABAPENTIN 300 MG PO CAPS: ORAL_CAPSULE | ORAL | Status: DC

## 2013-11-12 MED ORDER — OXYCODONE-ACETAMINOPHEN 5-325 MG PO TABS
1.0000 | ORAL_TABLET | Freq: Four times a day (QID) | ORAL | Status: DC | PRN
Start: 2013-11-12 — End: 2013-11-12

## 2013-11-12 MED ORDER — OXYCODONE-ACETAMINOPHEN 5-325 MG PO TABS: 1.0000 | ORAL_TABLET | Freq: Four times a day (QID) | ORAL | Status: DC | PRN

## 2013-11-12 NOTE — Addendum Note (Signed)
Addended by: Dene Gentry on: 11/12/2013 04:52 PM   Modules accepted: Orders

## 2013-11-12 NOTE — Telephone Encounter (Signed)
We could prescribe oxycodone once if she would like to try this instead.  Per policy would not provide refills of any additional narcotics.  If she wants this she would have to pick it up.  If she does, we can also go over her MRI at the same time (she does not need an appointment).  Let me know.  Thanks!

## 2013-11-12 NOTE — Addendum Note (Signed)
Addended by: Dene Gentry on: 11/12/2013 04:47 PM   Modules accepted: Orders

## 2013-11-13 ENCOUNTER — Telehealth: Payer: Self-pay | Admitting: Family Medicine

## 2013-11-13 ENCOUNTER — Encounter: Payer: Self-pay | Admitting: Family Medicine

## 2013-11-13 ENCOUNTER — Other Ambulatory Visit: Payer: Self-pay | Admitting: *Deleted

## 2013-11-13 DIAGNOSIS — M5010 Cervical disc disorder with radiculopathy, unspecified cervical region: Secondary | ICD-10-CM

## 2013-11-13 NOTE — Telephone Encounter (Signed)
Extremely unlikely it's from the medicines though those aren't going to help her immediately - should take a few days of her titrating them up for the medicines to help.  Will write a note for today.  Neurosurgery referral in process.

## 2013-11-14 ENCOUNTER — Encounter: Payer: Self-pay | Admitting: Family Medicine

## 2013-11-14 ENCOUNTER — Encounter: Payer: Self-pay | Admitting: *Deleted

## 2013-11-15 ENCOUNTER — Ambulatory Visit: Payer: 59 | Attending: Family Medicine | Admitting: Rehabilitation

## 2013-11-15 DIAGNOSIS — M5090 Cervical disc disorder, unspecified, unspecified cervical region: Secondary | ICD-10-CM | POA: Diagnosis not present

## 2013-11-15 DIAGNOSIS — J45909 Unspecified asthma, uncomplicated: Secondary | ICD-10-CM | POA: Insufficient documentation

## 2013-11-15 DIAGNOSIS — M6281 Muscle weakness (generalized): Secondary | ICD-10-CM | POA: Insufficient documentation

## 2013-11-15 DIAGNOSIS — M542 Cervicalgia: Secondary | ICD-10-CM | POA: Insufficient documentation

## 2013-11-15 DIAGNOSIS — IMO0001 Reserved for inherently not codable concepts without codable children: Secondary | ICD-10-CM | POA: Insufficient documentation

## 2013-11-19 ENCOUNTER — Ambulatory Visit: Payer: 59 | Admitting: Rehabilitation

## 2013-11-19 DIAGNOSIS — IMO0001 Reserved for inherently not codable concepts without codable children: Secondary | ICD-10-CM | POA: Diagnosis not present

## 2013-11-22 ENCOUNTER — Ambulatory Visit: Payer: 59 | Admitting: Rehabilitation

## 2013-11-25 ENCOUNTER — Encounter: Payer: 59 | Admitting: Rehabilitation

## 2013-11-25 ENCOUNTER — Other Ambulatory Visit: Payer: Self-pay | Admitting: Physician Assistant

## 2013-11-25 MED ORDER — ZOLPIDEM TARTRATE 10 MG PO TABS: ORAL_TABLET | ORAL | Status: DC

## 2013-11-26 ENCOUNTER — Ambulatory Visit: Payer: 59 | Admitting: Rehabilitation

## 2013-11-26 ENCOUNTER — Ambulatory Visit: Payer: 59 | Admitting: Physician Assistant

## 2013-11-26 DIAGNOSIS — IMO0001 Reserved for inherently not codable concepts without codable children: Secondary | ICD-10-CM | POA: Diagnosis not present

## 2013-11-26 DIAGNOSIS — Z0289 Encounter for other administrative examinations: Secondary | ICD-10-CM

## 2013-11-29 ENCOUNTER — Ambulatory Visit: Payer: 59 | Admitting: Rehabilitation

## 2013-11-29 ENCOUNTER — Encounter: Payer: Self-pay | Admitting: Physician Assistant

## 2013-11-29 ENCOUNTER — Ambulatory Visit (INDEPENDENT_AMBULATORY_CARE_PROVIDER_SITE_OTHER): Payer: 59 | Admitting: Physician Assistant

## 2013-11-29 ENCOUNTER — Ambulatory Visit: Payer: 59 | Admitting: Family Medicine

## 2013-11-29 VITALS — BP 122/76 | HR 108 | Ht 63.0 in | Wt 143.0 lb

## 2013-11-29 DIAGNOSIS — L299 Pruritus, unspecified: Secondary | ICD-10-CM | POA: Insufficient documentation

## 2013-11-29 DIAGNOSIS — M5010 Cervical disc disorder with radiculopathy, unspecified cervical region: Secondary | ICD-10-CM

## 2013-11-29 DIAGNOSIS — M76899 Other specified enthesopathies of unspecified lower limb, excluding foot: Secondary | ICD-10-CM

## 2013-11-29 DIAGNOSIS — M5412 Radiculopathy, cervical region: Secondary | ICD-10-CM

## 2013-11-29 DIAGNOSIS — R635 Abnormal weight gain: Secondary | ICD-10-CM | POA: Insufficient documentation

## 2013-11-29 DIAGNOSIS — M7060 Trochanteric bursitis, unspecified hip: Secondary | ICD-10-CM | POA: Insufficient documentation

## 2013-11-29 DIAGNOSIS — M7061 Trochanteric bursitis, right hip: Secondary | ICD-10-CM

## 2013-11-29 MED ORDER — HYDROXYZINE HCL 10 MG PO TABS: 10.0000 mg | ORAL_TABLET | Freq: Three times a day (TID) | ORAL | Status: DC | PRN

## 2013-11-29 MED ORDER — PROMETHAZINE HCL 25 MG PO TABS: 25.0000 mg | ORAL_TABLET | Freq: Four times a day (QID) | ORAL | Status: DC | PRN
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
## 2013-11-29 NOTE — Patient Instructions (Addendum)
Hip Bursitis  Ibuprofen 800mg  up to three times a day.    Bursitis is a swelling and soreness (inflammation) of a fluid-filled sac (bursa). This sac overlies and protects the joints.  CAUSES   Injury.  Overuse of the muscles surrounding the joint.  Arthritis.  Gout.  Infection.  Cold weather.  Inadequate warm-up and conditioning prior to activities. The cause may not be known.  SYMPTOMS   Mild to severe irritation.  Tenderness and swelling over the outside of the hip.  Pain with motion of the hip.  If the bursa becomes infected, a fever may be present. Redness, tenderness, and warmth will develop over the hip. Symptoms usually lessen in 3 to 4 weeks with treatment, but can come back. TREATMENT If conservative treatment does not work, your caregiver may advise draining the bursa and injecting cortisone into the area. This may speed up the healing process. This may also be used as an initial treatment of choice. HOME CARE INSTRUCTIONS   Apply ice to the affected area for 15-20 minutes every 3 to 4 hours while awake for the first 2 days. Put the ice in a plastic bag and place a towel between the bag of ice and your skin.  Rest the painful joint as much as possible, but continue to put the joint through a normal range of motion at least 4 times per day. When the pain lessens, begin normal, slow movements and usual activities to help prevent stiffness of the hip.  Only take over-the-counter or prescription medicines for pain, discomfort, or fever as directed by your caregiver.  Use crutches to limit weight bearing on the hip joint, if advised.  Elevate your painful hip to reduce swelling. Use pillows for propping and cushioning your legs and hips.  Gentle massage may provide comfort and decrease swelling. SEEK IMMEDIATE MEDICAL CARE IF:   Your pain increases even during treatment, or you are not improving.  You have a fever.  You have heat and inflammation over the  involved bursa.  You have any other questions or concerns. MAKE SURE YOU:   Understand these instructions.  Will watch your condition.  Will get help right away if you are not doing well or get worse. Document Released: 10/08/2001 Document Revised: 07/11/2011 Document Reviewed: 05/07/2008 [facility] Patient Information 2015 [place], [place]. This information is not intended to replace advice given to you by your health care provider. Make sure you discuss any questions you have with your health care provider.

## 2013-11-30 HISTORY — PX: CERVICAL SPINE SURGERY: SHX589

## 2013-12-02 ENCOUNTER — Ambulatory Visit: Payer: 59 | Admitting: Family Medicine

## 2013-12-03 ENCOUNTER — Encounter: Payer: Self-pay | Admitting: Emergency Medicine

## 2013-12-03 ENCOUNTER — Emergency Department
Admission: EM | Admit: 2013-12-03 | Discharge: 2013-12-03 | Disposition: A | Discharge status: Home / Self Care | Payer: 59 | Source: Home / Self Care

## 2013-12-03 ENCOUNTER — Ambulatory Visit: Payer: 59 | Admitting: Rehabilitation

## 2013-12-03 DIAGNOSIS — M5010 Cervical disc disorder with radiculopathy, unspecified cervical region: Secondary | ICD-10-CM

## 2013-12-03 DIAGNOSIS — M5412 Radiculopathy, cervical region: Secondary | ICD-10-CM

## 2013-12-03 MED ORDER — KETOROLAC TROMETHAMINE 60 MG/2ML IM SOLN
60.0000 mg | Freq: Once | INTRAMUSCULAR | Status: AC
  Administered 2013-12-03: 60 mg via INTRAMUSCULAR

## 2013-12-03 MED ORDER — IBUPROFEN 800 MG PO TABS: 800.0000 mg | ORAL_TABLET | Freq: Three times a day (TID) | ORAL | Status: DC | PRN
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
## 2013-12-03 NOTE — Discharge Instructions (Signed)
Increase roxicodone to every 4 hours for next 36 hours then go back up to every 6 hours as prescribed before.  Toradol was given in office.  Use ibuprofen every 8 hours until 7 days before surgery.  Written out of work until surgery.  Flexeril for muscle relaxation every 8hours.  Warm compresses.

## 2013-12-04 NOTE — ED Provider Notes (Signed)
Agree with exam, assessment, and plan.   Kandra Nicolas, MD 12/04/13 [phone]

## 2013-12-06 ENCOUNTER — Ambulatory Visit: Payer: 59 | Admitting: Rehabilitation

## 2013-12-08 ENCOUNTER — Other Ambulatory Visit: Payer: Self-pay | Admitting: Physician Assistant

## 2013-12-09 ENCOUNTER — Other Ambulatory Visit: Payer: Self-pay | Admitting: Physician Assistant

## 2013-12-10 ENCOUNTER — Ambulatory Visit: Payer: 59 | Admitting: Rehabilitation

## 2013-12-13 ENCOUNTER — Ambulatory Visit: Payer: 59 | Admitting: Rehabilitation

## 2013-12-21 ENCOUNTER — Encounter (HOSPITAL_COMMUNITY): Payer: Self-pay | Admitting: Emergency Medicine

## 2013-12-21 ENCOUNTER — Emergency Department (HOSPITAL_COMMUNITY)
Admission: EM | Admit: 2013-12-21 | Discharge: 2013-12-21 | Disposition: A | Discharge status: Home / Self Care | Payer: 59 | Attending: Emergency Medicine | Admitting: Emergency Medicine

## 2013-12-21 DIAGNOSIS — Z87891 Personal history of nicotine dependence: Secondary | ICD-10-CM | POA: Insufficient documentation

## 2013-12-21 DIAGNOSIS — Z79899 Other long term (current) drug therapy: Secondary | ICD-10-CM | POA: Insufficient documentation

## 2013-12-21 DIAGNOSIS — F411 Generalized anxiety disorder: Secondary | ICD-10-CM | POA: Insufficient documentation

## 2013-12-21 DIAGNOSIS — IMO0002 Reserved for concepts with insufficient information to code with codable children: Secondary | ICD-10-CM | POA: Insufficient documentation

## 2013-12-21 DIAGNOSIS — Z981 Arthrodesis status: Secondary | ICD-10-CM | POA: Diagnosis not present

## 2013-12-21 DIAGNOSIS — M542 Cervicalgia: Secondary | ICD-10-CM | POA: Diagnosis present

## 2013-12-21 DIAGNOSIS — K589 Irritable bowel syndrome without diarrhea: Secondary | ICD-10-CM | POA: Diagnosis not present

## 2013-12-21 LAB — CBC WITH DIFFERENTIAL/PLATELET
Basophils Absolute: 0 10*3/uL (ref 0.0–0.1)
Basophils Relative: 0 % (ref 0–1)
EOS PCT: 0 % (ref 0–5)
Eosinophils Absolute: 0 10*3/uL (ref 0.0–0.7)
HCT: 35.3 % — ABNORMAL LOW (ref 36.0–46.0)
HEMOGLOBIN: 12.3 g/dL (ref 12.0–15.0)
LYMPHS ABS: 2.7 10*3/uL (ref 0.7–4.0)
Lymphocytes Relative: 19 % (ref 12–46)
MCH: 32.5 pg (ref 26.0–34.0)
MCHC: 34.8 g/dL (ref 30.0–36.0)
MCV: 93.1 fL (ref 78.0–100.0)
MONOS PCT: 10 % (ref 3–12)
Monocytes Absolute: 1.4 10*3/uL — ABNORMAL HIGH (ref 0.1–1.0)
Neutro Abs: 10.5 10*3/uL — ABNORMAL HIGH (ref 1.7–7.7)
Neutrophils Relative %: 71 % (ref 43–77)
Platelets: 301 10*3/uL (ref 150–400)
RBC: 3.79 MIL/uL — AB (ref 3.87–5.11)
RDW: 12.1 % (ref 11.5–15.5)
WBC: 14.7 10*3/uL — ABNORMAL HIGH (ref 4.0–10.5)

## 2013-12-21 LAB — BASIC METABOLIC PANEL
Anion gap: 15 (ref 5–15)
BUN: 12 mg/dL (ref 6–23)
CO2: 24 mEq/L (ref 19–32)
Calcium: 9.4 mg/dL (ref 8.4–10.5)
Chloride: 99 mEq/L (ref 96–112)
Creatinine, Ser: 0.69 mg/dL (ref 0.50–1.10)
GFR calc Af Amer: >90 mL/min (ref >90)
GFR calc non Af Amer: >90 mL/min (ref >90)
GLUCOSE: 97 mg/dL (ref 70–99)
POTASSIUM: 3.7 meq/L (ref 3.7–5.3)
Sodium: 138 mEq/L (ref 137–147)

## 2013-12-21 LAB — I-STAT CG4 LACTIC ACID, ED: Lactic Acid, Venous: 0.79 mmol/L (ref 0.5–2.2)

## 2013-12-21 MED ORDER — DEXAMETHASONE SODIUM PHOSPHATE 4 MG/ML IJ SOLN
4.0000 mg | Freq: Once | INTRAMUSCULAR | Status: AC
  Administered 2013-12-21: 4 mg via INTRAVENOUS
  Filled 2013-12-21: qty 1

## 2013-12-21 MED ORDER — HYDROMORPHONE HCL PF 1 MG/ML IJ SOLN
1.0000 mg | Freq: Once | INTRAMUSCULAR | Status: AC
  Administered 2013-12-21: 1 mg via INTRAVENOUS
  Filled 2013-12-21: qty 1

## 2013-12-21 MED ORDER — SODIUM CHLORIDE 0.9 % IV BOLUS (SEPSIS)
1000.0000 mL | Freq: Once | INTRAVENOUS | Status: AC
  Administered 2013-12-21: 1000 mL via INTRAVENOUS

## 2013-12-21 MED ORDER — HYDROMORPHONE HCL 4 MG PO TABS: 4.0000 mg | ORAL_TABLET | ORAL | Status: DC | PRN
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
## 2013-12-21 NOTE — ED Notes (Signed)
Patient refused wheelchair. 

## 2013-12-21 NOTE — Consult Note (Signed)
Reason for Consult: Neck pain Referring Physician: Emergency department  [name] is an 38 y.o. female.  HPI: Patient is a 38 year old female who is 3 days status post anterior cervical discectomy and fusion has had some severe neck pain that started on postop day 1 and 2. Patient initially went to the emergency department and [facility] was evaluated given a shot of steroids and sent home on Dilaudid and told her to call her neurosurgeon on Saturday to obtain a prescription for Dilaudid. When she called I explained were not able to provide prescriptions or call and narcotic pain medication on weekends and that if she still needed medications strong histologically have to go back to the urgent care or weight to call Dr. Joya Salm on Monday morning. So apparently she went back to the urgent care they admitted her for approximately 6 hours checked a cervical spine MRI scan that we do not have the images to view her cells although the report looked like normal postoperative anterior cervical fusion. Then they transferred her to [facility]. Currently at [facility] cone patient looks comfortable she is complaining of neck pain she has no new numbness or tingling and no new weakness. Her incision shows no evidence of a cervical hematoma so I recommended another dose of Decadron IV and sent home with enough Dilaudid to get her to Monday and discuss with Dr. Joya Salm.  Past Medical History  Diagnosis Date  . Anxiety   . Headache(784.0)   . IBS (irritable bowel syndrome)     questionable, per pt; saw Dr. Collene Mares  . IC (interstitial cystitis)   . Insomnia     Past Surgical History  Procedure Laterality Date  . Cesarean section    . Pelvic laparoscopy  2003    endometriosis  . Ovarian cyst removal    . Dilation and evacuation N/A 05/07/2013    Procedure: DILATATION AND EVACUATION with tissue sent for chromosome analysis;  Surgeon: Lovenia Kim, MD;  Location: [facility];  Service: Gynecology;  Laterality:  N/A;  . Cervical spine surgery      Family History  Problem Relation Age of Onset  . Heart attack Father     Social History:  reports that she has quit smoking. Her smoking use included Cigarettes. She smoked 0.30 packs per day. She has never used smokeless tobacco. She reports that she drinks alcohol. She reports that she does not use illicit drugs.  Allergies:  Allergies  Allergen Reactions  . Eggs Or Egg-Derived Products     Medications: I have reviewed the patient's current medications.  No results found for this or any previous visit (from the past 48 hour(s)).  No results found.  Review of Systems  Constitutional: Negative.   Eyes: Negative.   Respiratory: Negative.   Cardiovascular: Negative.   Gastrointestinal: Negative.   Genitourinary: Negative.   Musculoskeletal: Positive for neck pain.  Skin: Negative.   Neurological: Positive for sensory change.   Blood pressure 121/79, pulse 89, temperature 98.4 F (36.9 C), temperature source Oral, resp. rate 20, last menstrual period 11/29/2013, SpO2 97.00%. Physical Exam  Constitutional: She is oriented to person, place, and time. She appears well-developed and well-nourished.  HENT:  Head: Normocephalic and atraumatic.  Eyes: Pupils are equal, round, and reactive to light.  Respiratory: Effort normal.  GI: Soft.  Neurological: She is alert and oriented to person, place, and time. She has normal strength. GCS eye subscore is 4. GCS verbal subscore is 5. GCS motor subscore is  6.  Anterior cervical incision looks good with no evidence of erythema or swelling strength appears to be 5 out of 5 she does have some baseline decreased sensation but no new numbness or tingling.    Assessment/Plan: Plan as above patient is a medically cleared by the emergency department she'll be treated with IV steroid and sent home with a prescription  Liona Wengert P 12/21/2013, 7:29 PM

## 2013-12-21 NOTE — Discharge Instructions (Signed)
Take dilaudid instead of hydrocodone. Continue other pain meds.   Finish steroid taper as prescribed.   Follow up with Dr. Joya Salm on Monday.   Return to ER if you have worsening pain, fever, numbness, weakness.

## 2013-12-21 NOTE — ED Notes (Signed)
I Stat Lactic Acid results shown to Dr. Allie Bossier

## 2013-12-28 LAB — CULTURE, BLOOD (ROUTINE X 2)
Culture: NO GROWTH
Culture: NO GROWTH

## 2014-01-13 ENCOUNTER — Other Ambulatory Visit: Payer: Self-pay

## 2014-01-13 ENCOUNTER — Telehealth: Payer: Self-pay | Admitting: Family Medicine

## 2014-01-13 MED ORDER — CYCLOBENZAPRINE HCL 10 MG PO TABS: 10.0000 mg | ORAL_TABLET | Freq: Three times a day (TID) | ORAL | Status: DC | PRN

## 2014-01-13 NOTE — Telephone Encounter (Signed)
[name] wants a refill on the flexeril. She is still having left shoulder pain. She also wanted a refill on ativan. I advised her that ativan is not a medication for long term use for anxiety. Patient scheduled to come in on Wednesday for follow up of shoulder pain and anxiety. The flexeril is an historical medication. Please advise about a refill. She would like it sent to [facility].

## 2014-01-13 NOTE — Telephone Encounter (Signed)
Patient will need to discuss with the neurosurgeon.  The burning quality suggests this is nerve related following her recent surgery.  If they feel it is not related to the surgery we can evaluate her shoulder.  But she is only 3 weeks out from surgery and I suspect this is normal postoperative pain following release of pressure on the nerves in her neck.

## 2014-01-14 ENCOUNTER — Ambulatory Visit: Payer: 59 | Attending: Neurosurgery | Admitting: Physical Therapy

## 2014-01-14 DIAGNOSIS — IMO0001 Reserved for inherently not codable concepts without codable children: Secondary | ICD-10-CM | POA: Diagnosis present

## 2014-01-14 DIAGNOSIS — M25519 Pain in unspecified shoulder: Secondary | ICD-10-CM | POA: Diagnosis not present

## 2014-01-14 DIAGNOSIS — M542 Cervicalgia: Secondary | ICD-10-CM | POA: Diagnosis not present

## 2014-01-15 ENCOUNTER — Encounter: Payer: Self-pay | Admitting: Physician Assistant

## 2014-01-15 ENCOUNTER — Ambulatory Visit (INDEPENDENT_AMBULATORY_CARE_PROVIDER_SITE_OTHER): Payer: 59 | Admitting: Physician Assistant

## 2014-01-15 VITALS — BP 106/73 | HR 107 | Ht 63.0 in | Wt 138.0 lb

## 2014-01-15 DIAGNOSIS — F32A Depression, unspecified: Secondary | ICD-10-CM

## 2014-01-15 DIAGNOSIS — F411 Generalized anxiety disorder: Secondary | ICD-10-CM

## 2014-01-15 DIAGNOSIS — T40605A Adverse effect of unspecified narcotics, initial encounter: Secondary | ICD-10-CM

## 2014-01-15 DIAGNOSIS — T402X5A Adverse effect of other opioids, initial encounter: Secondary | ICD-10-CM

## 2014-01-15 DIAGNOSIS — M5010 Cervical disc disorder with radiculopathy, unspecified cervical region: Secondary | ICD-10-CM

## 2014-01-15 DIAGNOSIS — K5909 Other constipation: Secondary | ICD-10-CM

## 2014-01-15 DIAGNOSIS — M5412 Radiculopathy, cervical region: Secondary | ICD-10-CM

## 2014-01-15 DIAGNOSIS — F329 Major depressive disorder, single episode, unspecified: Secondary | ICD-10-CM

## 2014-01-15 DIAGNOSIS — K5903 Drug induced constipation: Secondary | ICD-10-CM

## 2014-01-15 DIAGNOSIS — F3289 Other specified depressive episodes: Secondary | ICD-10-CM

## 2014-01-15 MED ORDER — LINACLOTIDE 145 MCG PO CAPS: 145.0000 ug | ORAL_CAPSULE | Freq: Every day | ORAL | Status: DC

## 2014-01-15 MED ORDER — ZOLPIDEM TARTRATE 10 MG PO TABS: 10.0000 mg | ORAL_TABLET | Freq: Every evening | ORAL | Status: DC | PRN

## 2014-01-15 MED ORDER — CELECOXIB 200 MG PO CAPS: 200.0000 mg | ORAL_CAPSULE | Freq: Two times a day (BID) | ORAL | Status: DC

## 2014-01-15 MED ORDER — ALPRAZOLAM 1 MG PO TABS: 1.0000 mg | ORAL_TABLET | Freq: Two times a day (BID) | ORAL | Status: DC | PRN

## 2014-01-15 MED ORDER — NALOXEGOL OXALATE 25 MG PO TABS: 25.0000 mg | ORAL_TABLET | Freq: Every day | ORAL | Status: DC

## 2014-01-15 MED ORDER — PROMETHAZINE HCL 25 MG PO TABS: 25.0000 mg | ORAL_TABLET | Freq: Four times a day (QID) | ORAL | Status: DC | PRN

## 2014-01-15 MED ORDER — CARISOPRODOL 350 MG PO TABS: 350.0000 mg | ORAL_TABLET | Freq: Three times a day (TID) | ORAL | Status: DC

## 2014-01-15 NOTE — Patient Instructions (Signed)

## 2014-01-17 DIAGNOSIS — T402X5A Adverse effect of other opioids, initial encounter: Secondary | ICD-10-CM

## 2014-01-17 DIAGNOSIS — K5903 Drug induced constipation: Secondary | ICD-10-CM | POA: Insufficient documentation

## 2014-01-17 DIAGNOSIS — M5412 Radiculopathy, cervical region: Secondary | ICD-10-CM | POA: Insufficient documentation

## 2014-01-17 MED ORDER — AMBULATORY NON FORMULARY MEDICATION: Status: DC

## 2014-01-17 NOTE — Telephone Encounter (Signed)
Would like to try and get surgery records for pt from [place] [facility], Dr. Bonnita Hollow?   Also I am printing an order to fax to PT pt is seeing for specific exercises to target with patient. Confirm where to send with pt.

## 2014-01-17 NOTE — Progress Notes (Signed)
   Subjective:    Patient ID: [name], female    DOB: 07/02/1975, 38 y.o.   MRN: [id number]  HPI Pt is a 38 yo female who presents to the clinic after her neck fusion surgery on 12/18/2013. She feels like pain is worse and going into her left shoulder. She has discussed with surgeon and she does not feel like he is listening. She is in constant pain. She cannot drive for any longer than 10 minutes at a time without pain being out of control. She was given flexeril and did not help at all. She has been given hydromorphone which she does not take per pt makes her too sleepy. She is taking Norvo 10/325. She wants refills. She does report some numbness and tingling in left upper shoulder running down arm. She cannot lift. Notice her strength changing as well.    Review of Systems  All other systems reviewed and are negative.      Objective:   Physical Exam  Constitutional: She is oriented to person, place, and time. She appears well-developed and well-nourished.  Cardiovascular: Regular rhythm and normal heart sounds.   Tachycardia at 107.   Pulmonary/Chest: Effort normal and breath sounds normal.  Musculoskeletal:  ROm of neck 60 degrees side to side.   No pain over c-spine.   Pain to palpation along paraspinous muscles on left side, into upper back, left chest wall.   Not able to abduct left shoulder to more than 90 degrees.  Pain with all ROm of left shoulder.   There is still some bruising from surgery over left neck and chest wall.   Hand grip 3/5 left side.  Left arm 3/5.  Neurological: She is alert and oriented to person, place, and time.  Skin: Skin is dry.  Psychiatric: She has a normal mood and affect. Her behavior is normal.          Assessment & Plan:  Left cervical radulitis- seems consistent with inflammation and muscle spasms. Stop flexeril and valium. Start soma. Discussed sedation warning. Will send PT some things to target with patient next  week. I do not see need for imaging of shoulder. Perhaps repeat MRI at some point but of more value by surgeon. Will try celebrex for some NSAID action. Alternate ice and heat.   Discussed with patient needs to get pain medication from surgeon at this point. Needs to follow up with him as well on these issues. I do think they are residual from surgery. Perhaps more muscular than structural.   Depression- I do think depression is making pain worse. Pt tried and failed cymbalta, effexor. She did not like the weight gain of Elavil. Offered to try new SSRI. Pt declines new medication at this point. Refilled xanax. Discussed that is not maintance and need to try other things if this down. Follow up in 4 weeks.   Constipation due to pain meds- refilled linzess to try or new medication movantik. Gave coupon card. Take once a day. Follow up if not improving.

## 2014-01-20 ENCOUNTER — Ambulatory Visit: Payer: 59 | Admitting: Rehabilitation

## 2014-01-21 ENCOUNTER — Telehealth: Payer: Self-pay | Admitting: *Deleted

## 2014-01-21 NOTE — Telephone Encounter (Signed)
Pt left vm this morning that they are recommending a cervical injection to help with her shoulder.  She wants to know if you agree with this or if you would like for her to go through with the shoulder MRI.  She stated that she doesn't want to go through with an injection if you don't think it would help because you are the only one that's really listening to her as far as her pain is concerned.  Please advise.

## 2014-01-21 NOTE — Telephone Encounter (Signed)
Yes I think cervical injection is a good idea before MRI. I do think this can calm down inflammation. I really feel like MRI will likely be normal and not yield any new information.

## 2014-01-21 NOTE — Telephone Encounter (Signed)
Pt notified to go ahead with the cervical injection.  I advised her per Dr. Darene Lamer to do so with numbing and not sedation.  Pt voiced understanding and agreement.  She will keep [place] posted.

## 2014-01-22 ENCOUNTER — Ambulatory Visit: Payer: 59 | Admitting: Physical Therapy

## 2014-01-22 DIAGNOSIS — IMO0001 Reserved for inherently not codable concepts without codable children: Secondary | ICD-10-CM | POA: Diagnosis not present

## 2014-01-28 ENCOUNTER — Ambulatory Visit: Payer: 59 | Admitting: Rehabilitation

## 2014-01-28 DIAGNOSIS — IMO0001 Reserved for inherently not codable concepts without codable children: Secondary | ICD-10-CM | POA: Diagnosis not present

## 2014-01-30 ENCOUNTER — Ambulatory Visit: Payer: 59 | Attending: Family Medicine | Admitting: Physical Therapy

## 2014-01-30 DIAGNOSIS — M542 Cervicalgia: Secondary | ICD-10-CM | POA: Diagnosis present

## 2014-01-30 DIAGNOSIS — M25512 Pain in left shoulder: Secondary | ICD-10-CM | POA: Diagnosis present

## 2014-01-30 DIAGNOSIS — Z9889 Other specified postprocedural states: Secondary | ICD-10-CM | POA: Insufficient documentation

## 2014-01-31 ENCOUNTER — Other Ambulatory Visit: Payer: Self-pay | Admitting: Physician Assistant

## 2014-01-31 ENCOUNTER — Telehealth: Payer: Self-pay | Admitting: *Deleted

## 2014-01-31 MED ORDER — ESCITALOPRAM OXALATE 10 MG PO TABS: 10.0000 mg | ORAL_TABLET | Freq: Every day | ORAL | Status: DC

## 2014-01-31 NOTE — Telephone Encounter (Signed)
We can start another SSRI like lexapro. But will take time. You are already taking benzo and we don't want to increase that. You can become dependent. Would you like to see psychiatrist?

## 2014-01-31 NOTE — Telephone Encounter (Signed)
Sent to pharmacy 

## 2014-01-31 NOTE — Telephone Encounter (Signed)
Pt left vm stating that she needs something stronger for her anxiety.  She states that all she does is cry & she's tired of crying in front of her kids.

## 2014-01-31 NOTE — Telephone Encounter (Signed)
Pt is okay with starting Lexapro.  She stated that she had a therapist that she was talking to before so she is going to call his office & see if she can get an appt.

## 2014-02-03 ENCOUNTER — Ambulatory Visit: Payer: 59 | Admitting: Physical Therapy

## 2014-02-03 DIAGNOSIS — M25512 Pain in left shoulder: Secondary | ICD-10-CM | POA: Diagnosis not present

## 2014-02-03 NOTE — Telephone Encounter (Signed)
Patient advised.

## 2014-02-05 ENCOUNTER — Ambulatory Visit: Payer: 59 | Admitting: Rehabilitation

## 2014-02-05 ENCOUNTER — Encounter: Payer: Self-pay | Admitting: Physician Assistant

## 2014-02-05 ENCOUNTER — Ambulatory Visit (INDEPENDENT_AMBULATORY_CARE_PROVIDER_SITE_OTHER): Payer: 59 | Admitting: Physician Assistant

## 2014-02-05 VITALS — BP 121/81 | HR 113 | Ht 63.0 in | Wt 135.0 lb

## 2014-02-05 DIAGNOSIS — B029 Zoster without complications: Secondary | ICD-10-CM

## 2014-02-05 DIAGNOSIS — R21 Rash and other nonspecific skin eruption: Secondary | ICD-10-CM

## 2014-02-05 MED ORDER — METHOCARBAMOL 500 MG PO TABS: 500.0000 mg | ORAL_TABLET | Freq: Three times a day (TID) | ORAL | Status: DC

## 2014-02-05 MED ORDER — VALACYCLOVIR HCL 1 G PO TABS: 1000.0000 mg | ORAL_TABLET | Freq: Three times a day (TID) | ORAL | Status: DC

## 2014-02-05 MED ORDER — HYDROCODONE-ACETAMINOPHEN 7.5-325 MG PO TABS: 1.0000 | ORAL_TABLET | Freq: Three times a day (TID) | ORAL | Status: DC | PRN

## 2014-02-05 MED ORDER — LIDOCAINE 0.5 % EX GEL: CUTANEOUS | Status: DC

## 2014-02-05 MED ORDER — LIDOCAINE 5 % EX OINT: 1.0000 "application " | TOPICAL_OINTMENT | Freq: Three times a day (TID) | CUTANEOUS | Status: DC | PRN

## 2014-02-05 NOTE — Patient Instructions (Signed)

## 2014-02-05 NOTE — Progress Notes (Signed)
   Subjective:    Patient ID: [name], female    DOB: [birth year], 38 y.o.   MRN: [id number]  HPI Pt presents to the clinic with rash on right buttocks and down right leg. Noticed yesterday and continued to get more inflamed. Mostly burns and tinglings but has some itchy. Tried triamcinolone but no improvement. Under a lot of stress. Still in tremendous neck pain even after surgery. Went to get neck injection and would not give due to white count being elevated.     Review of Systems  All other systems reviewed and are negative.      Objective:   Physical Exam  Constitutional: She appears well-developed and well-nourished.  Cardiovascular: Normal rate, regular rhythm and normal heart sounds.   Pulmonary/Chest: Effort normal and breath sounds normal.  Skin:     Psychiatric: She has a normal mood and affect. Her behavior is normal.          Assessment & Plan:  Rash/shingles- certainly has stress component. Given valtrex and lidoderm. norco for shingles pain. Certainly aware will help with neck pain. Discussed we ARE NOT giving pt rx for ongoing neck pain. Surgeon can given until released. After release can then consider pain clinic.

## 2014-02-10 ENCOUNTER — Ambulatory Visit: Payer: 59 | Admitting: Physical Therapy

## 2014-02-13 ENCOUNTER — Ambulatory Visit: Payer: 59 | Admitting: Rehabilitation

## 2014-02-17 ENCOUNTER — Ambulatory Visit: Payer: 59 | Admitting: Physical Therapy

## 2014-02-20 ENCOUNTER — Ambulatory Visit: Payer: 59 | Admitting: Rehabilitation

## 2014-02-24 ENCOUNTER — Ambulatory Visit: Payer: 59 | Admitting: Physical Therapy

## 2014-02-24 ENCOUNTER — Encounter: Payer: Self-pay | Admitting: Physician Assistant

## 2014-02-25 ENCOUNTER — Other Ambulatory Visit: Payer: Self-pay | Admitting: Neurosurgery

## 2014-02-25 DIAGNOSIS — M25512 Pain in left shoulder: Secondary | ICD-10-CM

## 2014-02-27 ENCOUNTER — Ambulatory Visit: Payer: 59 | Admitting: Physical Therapy

## 2014-03-03 ENCOUNTER — Other Ambulatory Visit: Payer: 59

## 2014-03-10 ENCOUNTER — Ambulatory Visit (INDEPENDENT_AMBULATORY_CARE_PROVIDER_SITE_OTHER): Payer: 59

## 2014-03-10 ENCOUNTER — Other Ambulatory Visit: Payer: Self-pay | Admitting: Physician Assistant

## 2014-03-10 DIAGNOSIS — M25512 Pain in left shoulder: Secondary | ICD-10-CM

## 2014-03-29 ENCOUNTER — Other Ambulatory Visit: Payer: Self-pay | Admitting: Physician Assistant

## 2014-03-31 ENCOUNTER — Telehealth: Payer: Self-pay | Admitting: *Deleted

## 2014-03-31 MED ORDER — ZOLPIDEM TARTRATE 10 MG PO TABS: 10.0000 mg | ORAL_TABLET | Freq: Every evening | ORAL | Status: DC | PRN

## 2014-03-31 NOTE — Telephone Encounter (Signed)
Pt left vm stating that she saw a pain management dr today and he wants you to change her lexapro to cymbalta.  He is keeping her on the xanax but is stopping all narcotics.  She states that she is going to [facility] for the suboxone for narcotics use.

## 2014-04-01 ENCOUNTER — Other Ambulatory Visit: Payer: Self-pay | Admitting: Physician Assistant

## 2014-04-01 ENCOUNTER — Ambulatory Visit (INDEPENDENT_AMBULATORY_CARE_PROVIDER_SITE_OTHER): Payer: 59 | Admitting: Family Medicine

## 2014-04-01 ENCOUNTER — Ambulatory Visit: Payer: 59 | Admitting: Rehabilitation

## 2014-04-01 ENCOUNTER — Telehealth: Payer: Self-pay | Admitting: *Deleted

## 2014-04-01 ENCOUNTER — Encounter: Payer: Self-pay | Admitting: Family Medicine

## 2014-04-01 ENCOUNTER — Other Ambulatory Visit: Payer: Self-pay | Admitting: *Deleted

## 2014-04-01 VITALS — BP 141/89 | HR 109 | Ht 63.0 in | Wt 128.0 lb

## 2014-04-01 DIAGNOSIS — M5010 Cervical disc disorder with radiculopathy, unspecified cervical region: Secondary | ICD-10-CM | POA: Diagnosis not present

## 2014-04-01 DIAGNOSIS — M25512 Pain in left shoulder: Secondary | ICD-10-CM | POA: Diagnosis not present

## 2014-04-01 DIAGNOSIS — F1190 Opioid use, unspecified, uncomplicated: Secondary | ICD-10-CM

## 2014-04-01 MED ORDER — DULOXETINE HCL 30 MG PO CPEP: ORAL_CAPSULE | ORAL | Status: DC

## 2014-04-01 MED ORDER — ALPRAZOLAM 1 MG PO TABS: 1.0000 mg | ORAL_TABLET | Freq: Two times a day (BID) | ORAL | Status: DC | PRN

## 2014-04-01 MED ORDER — METHYLPREDNISOLONE ACETATE 40 MG/ML IJ SUSP
40.0000 mg | Freq: Once | INTRAMUSCULAR | Status: AC
  Administered 2014-04-01: 40 mg via INTRA_ARTICULAR

## 2014-04-01 MED ORDER — DICLOFENAC SODIUM 1 % TD GEL: 4.0000 g | Freq: Four times a day (QID) | TRANSDERMAL | Status: DC

## 2014-04-01 NOTE — Telephone Encounter (Signed)
Pt notified of rx in office today.

## 2014-04-01 NOTE — Telephone Encounter (Signed)
dicofenac gel rx sent to pharm.

## 2014-04-01 NOTE — Telephone Encounter (Signed)
Callpt: sent cymbalta 30mg  for 7 days then increase to 60mg . Stop lexapro. Follow up in 1 month.

## 2014-04-01 NOTE — Telephone Encounter (Signed)
Lab slip ordered.

## 2014-04-01 NOTE — Patient Instructions (Signed)
Your MRI showed mild adhesive capsulitis but by exam I do not think this is the major issue. You were given a cortisone shot into this area. Call me in a week to let me know how you're doing. If minimally improved or not improved I would consider repeating the MRI of your neck with and without contrast. We will obtain the nerve conduction studies and other records.

## 2014-04-02 ENCOUNTER — Telehealth: Payer: Self-pay | Admitting: *Deleted

## 2014-04-02 DIAGNOSIS — M25512 Pain in left shoulder: Secondary | ICD-10-CM | POA: Insufficient documentation

## 2014-04-02 NOTE — Telephone Encounter (Signed)
Patient called and left a voicemail stating that she is still having pain which is radiating from her neck to her fingers. She stated that she is elevating her arm and wanted to know if she should get a sling. I will contact the patient and let her know that the cortisone can take several days up to a week before it kicks in and if she wants she can get a sling.
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
## 2014-04-02 NOTE — Telephone Encounter (Signed)
Yes it's fine if she tries a sling.  As we discussed I wouldn't do anything different from a medication perspective at this point and would not prescribe any pain medication.  We don't have their records yet to see exactly what they have her on though she mentioned neurontin.

## 2014-04-02 NOTE — Assessment & Plan Note (Signed)
given her full range of motion I do not think she has more than a very mild case of developing adhesive capsulitis at the worst.  I think her pain is more related to cervical spine.  We reviewed HEP for shoulder and went ahead with trial of intraarticular left shoulder injection for diagnostic/therapeutic purposes.  She is to call in a week to let us know how she is.  ROI filled out to get records from neurosurgery (office visits and the NCV/EMG report).  We could consider repeating MRI of cervical spine or her NCV/EMGs depending on those records.  Advised we would not fill pain medication or prednisone however.  After informed written consent, patient was seated on exam table. Left shoulder was prepped with alcohol swab and utilizing posterior approach, patient's left glenohumeral space was injected with 3:1 marcaine: depomedrol. Patient tolerated the procedure well without immediate complications.

## 2014-04-03 ENCOUNTER — Telehealth: Payer: Self-pay | Admitting: *Deleted

## 2014-04-03 ENCOUNTER — Telehealth: Payer: Self-pay | Admitting: Physician Assistant

## 2014-04-03 NOTE — Telephone Encounter (Signed)
Pt left vm yesterday stating that she went to the ED for pain.  They gave her a toradol injection, prednisone pack, as well as valium to take at night; 6 days worth.  She is very concerned because her BP was 145/95 pulse 150. She wants to know if you need her to come in to see you for the BP/pulse or what your suggestion would be.

## 2014-04-03 NOTE — Telephone Encounter (Signed)
Certainly pain can cause pulse rate to increase. There are medications to lower both but yes would need office visit.

## 2014-04-03 NOTE — Telephone Encounter (Signed)
Patient called and request to know if [name] will give a toradol injec for pain. Patient stated she called yesterday and left a message if you could call her back. She was in [place] and given Volum and a couple other scripts and has some questions regarding some of those meds and the pain. Thanks

## 2014-04-04 NOTE — Telephone Encounter (Signed)
If she can see me prefer with injection being given but if not we can allow her to be nurse visit only.

## 2014-04-04 NOTE — Telephone Encounter (Signed)
Spoke with pt this morning.  She was put in a neck brace & collar yesterday.  The stabilazation & the dicolfenac gel together have helped her pain tremendously.  I told her just to keep an eye on her BP, if she was out & about she can stop in here or a drug store to have it checked.  If it continues to stay elevated she knows that she will need an appt.  In regards to toradol injections, she wants to know that if she has a bad flare up can she come in on a nurse visit or will she need to see you to get it.  Please advise.

## 2014-04-08 LAB — DRUG SCREEN, URINE
Amphetamine Screen, Ur: NEGATIVE
Barbiturate Quant, Ur: NEGATIVE
Benzodiazepines.: POSITIVE — AB
COCAINE METABOLITES: NEGATIVE
CREATININE, U: 432.89 mg/dL
Marijuana Metabolite: NEGATIVE
Methadone: NEGATIVE
OPIATES: NEGATIVE
Phencyclidine (PCP): NEGATIVE
Propoxyphene: NEGATIVE

## 2014-04-09 ENCOUNTER — Encounter: Payer: Self-pay | Admitting: Family Medicine

## 2014-04-09 ENCOUNTER — Other Ambulatory Visit: Payer: Self-pay | Admitting: Physician Assistant

## 2014-04-09 MED ORDER — KETOPROFEN 50 MG PO CAPS: 50.0000 mg | ORAL_CAPSULE | Freq: Three times a day (TID) | ORAL | Status: DC | PRN

## 2014-04-10 ENCOUNTER — Telehealth: Payer: Self-pay | Admitting: *Deleted

## 2014-04-10 ENCOUNTER — Other Ambulatory Visit: Payer: Self-pay | Admitting: *Deleted

## 2014-04-10 NOTE — Telephone Encounter (Signed)
Pharm left vm stating that the ketoprofen you sent has been unavailable for quite some time & wanted to know if there was an alternative you could send in.

## 2014-04-11 ENCOUNTER — Other Ambulatory Visit: Payer: Self-pay | Admitting: Physician Assistant

## 2014-04-11 MED ORDER — KETOROLAC TROMETHAMINE 10 MG PO TABS: 10.0000 mg | ORAL_TABLET | Freq: Four times a day (QID) | ORAL | Status: DC | PRN

## 2014-04-14 ENCOUNTER — Encounter: Payer: Self-pay | Admitting: Physician Assistant

## 2014-04-14 ENCOUNTER — Ambulatory Visit (INDEPENDENT_AMBULATORY_CARE_PROVIDER_SITE_OTHER): Payer: 59 | Admitting: Physician Assistant

## 2014-04-14 ENCOUNTER — Other Ambulatory Visit: Payer: Self-pay | Admitting: Family Medicine

## 2014-04-14 VITALS — BP 123/77 | HR 104 | Temp 98.2°F | Ht 63.0 in | Wt 130.0 lb

## 2014-04-14 DIAGNOSIS — R059 Cough, unspecified: Secondary | ICD-10-CM

## 2014-04-14 DIAGNOSIS — J069 Acute upper respiratory infection, unspecified: Secondary | ICD-10-CM

## 2014-04-14 DIAGNOSIS — R05 Cough: Secondary | ICD-10-CM

## 2014-04-14 DIAGNOSIS — J209 Acute bronchitis, unspecified: Secondary | ICD-10-CM

## 2014-04-14 LAB — POCT INFLUENZA A/B
INFLUENZA B, POC: NEGATIVE
Influenza A, POC: NEGATIVE

## 2014-04-14 MED ORDER — ALBUTEROL SULFATE HFA 108 (90 BASE) MCG/ACT IN AERS: 2.0000 | INHALATION_SPRAY | RESPIRATORY_TRACT | Status: DC | PRN

## 2014-04-14 MED ORDER — HYDROCODONE-HOMATROPINE 5-1.5 MG/5ML PO SYRP: 5.0000 mL | ORAL_SOLUTION | Freq: Every evening | ORAL | Status: DC | PRN

## 2014-04-14 MED ORDER — AMOXICILLIN-POT CLAVULANATE 875-125 MG PO TABS: 1.0000 | ORAL_TABLET | Freq: Two times a day (BID) | ORAL | Status: DC

## 2014-04-14 NOTE — Patient Instructions (Signed)
Upper Respiratory Infection, Adult An upper respiratory infection (URI) is also sometimes known as the common cold. The upper respiratory tract includes the nose, sinuses, throat, trachea, and bronchi. Bronchi are the airways leading to the lungs. Most people improve within 1 week, but symptoms can last up to 2 weeks. A residual cough may last even longer.  CAUSES Many different viruses can infect the tissues lining the upper respiratory tract. The tissues become irritated and inflamed and often become very moist. Mucus production is also common. A cold is contagious. You can easily spread the virus to others by oral contact. This includes kissing, sharing a glass, coughing, or sneezing. Touching your mouth or nose and then touching a surface, which is then touched by another person, can also spread the virus. SYMPTOMS  Symptoms typically develop 1 to 3 days after you come in contact with a cold virus. Symptoms vary from person to person. They may include:  Runny nose.  Sneezing.  Nasal congestion.  Sinus irritation.  Sore throat.  Loss of voice (laryngitis).  Cough.  Fatigue.  Muscle aches.  Loss of appetite.  Headache.  Low-grade fever. DIAGNOSIS  You might diagnose your own cold based on familiar symptoms, since most people get a cold 2 to 3 times a year. Your caregiver can confirm this based on your exam. Most importantly, your caregiver can check that your symptoms are not due to another disease such as strep throat, sinusitis, pneumonia, asthma, or epiglottitis. Blood tests, throat tests, and X-rays are not necessary to diagnose a common cold, but they may sometimes be helpful in excluding other more serious diseases. Your caregiver will decide if any further tests are required. RISKS AND COMPLICATIONS  You may be at risk for a more severe case of the common cold if you smoke cigarettes, have chronic heart disease (such as heart failure) or lung disease (such as asthma), or if  you have a weakened immune system. The very young and very old are also at risk for more serious infections. Bacterial sinusitis, middle ear infections, and bacterial pneumonia can complicate the common cold. The common cold can worsen asthma and chronic obstructive pulmonary disease (COPD). Sometimes, these complications can require emergency medical care and may be life-threatening. PREVENTION  The best way to protect against getting a cold is to practice good hygiene. Avoid oral or hand contact with people with cold symptoms. Wash your hands often if contact occurs. There is no clear evidence that vitamin C, vitamin E, echinacea, or exercise reduces the chance of developing a cold. However, it is always recommended to get plenty of rest and practice good nutrition. TREATMENT  Treatment is directed at relieving symptoms. There is no cure. Antibiotics are not effective, because the infection is caused by a virus, not by bacteria. Treatment may include:  Increased fluid intake. Sports drinks offer valuable electrolytes, sugars, and fluids.  Breathing heated mist or steam (vaporizer or shower).  Eating chicken soup or other clear broths, and maintaining good nutrition.  Getting plenty of rest.  Using gargles or lozenges for comfort.  Controlling fevers with ibuprofen or acetaminophen as directed by your caregiver.  Increasing usage of your inhaler if you have asthma. Zinc gel and zinc lozenges, taken in the first 24 hours of the common cold, can shorten the duration and lessen the severity of symptoms. Pain medicines may help with fever, muscle aches, and throat pain. A variety of non-prescription medicines are available to treat congestion and runny nose. Your caregiver   can make recommendations and may suggest nasal or lung inhalers for other symptoms.  HOME CARE INSTRUCTIONS   Only take over-the-counter or prescription medicines for pain, discomfort, or fever as directed by your  caregiver.  Use a warm mist humidifier or inhale steam from a shower to increase air moisture. This may keep secretions moist and make it easier to breathe.  Drink enough water and fluids to keep your urine clear or pale yellow.  Rest as needed.  Return to work when your temperature has returned to normal or as your caregiver advises. You may need to stay home longer to avoid infecting others. You can also use a face mask and careful hand washing to prevent spread of the virus. SEEK MEDICAL CARE IF:   After the first few days, you feel you are getting worse rather than better.  You need your caregiver's advice about medicines to control symptoms.  You develop chills, worsening shortness of breath, or brown or red sputum. These may be signs of pneumonia.  You develop yellow or brown nasal discharge or pain in the face, especially when you bend forward. These may be signs of sinusitis.  You develop a fever, swollen neck glands, pain with swallowing, or white areas in the back of your throat. These may be signs of strep throat. SEEK IMMEDIATE MEDICAL CARE IF:   You have a fever.  You develop severe or persistent headache, ear pain, sinus pain, or chest pain.  You develop wheezing, a prolonged cough, cough up blood, or have a change in your usual mucus (if you have chronic lung disease).  You develop sore muscles or a stiff neck. Document Released: 10/12/2000 Document Revised: 07/11/2011 Document Reviewed: 07/24/2013 Exit[employer] Patient Information 2015 [employer]. This information is not intended to replace advice given to you by your health care provider. Make sure you discuss any questions you have with your health care provider.  

## 2014-04-14 NOTE — Progress Notes (Signed)
   Subjective:    Patient ID: [name], female    DOB: Jun 15, 1975, 38 y.o.   MRN: [id number]  HPI  Patient is a 38 year old female who presents to the clinic with nasal congestion, body aches, chills, cough and fatigue. Cough is not productive. She has tried Delsym, Sudafed, and Mucinex. She does feel like her chest is tight but denies any wheezing or shortness of breath. Her symptoms started 5 days ago.     Review of Systems  All other systems reviewed and are negative.      Objective:   Physical Exam  Constitutional: She is oriented to person, place, and time. She appears well-developed and well-nourished.  HENT:  Head: Normocephalic and atraumatic.  Right Ear: External ear normal.  Left Ear: External ear normal.  Mouth/Throat: Oropharynx is clear and moist.  TMs clear bilaterally.  Tenderness over maxillary sinuses to palpation.  Bilateral nasal turbinates red and swollen.    Eyes: Conjunctivae are normal. Right eye exhibits no discharge. Left eye exhibits no discharge.  Neck: Normal range of motion. Neck supple.  Cardiovascular: Normal rate, regular rhythm and normal heart sounds.   Pulmonary/Chest: Effort normal and breath sounds normal. She has no wheezes.  Lymphadenopathy:    She has no cervical adenopathy.  Neurological: She is alert and oriented to person, place, and time.  Skin: Skin is dry.  Psychiatric: She has a normal mood and affect. Her behavior is normal.          Assessment & Plan:  URI/cough/body aches-rapid influenza test was negative. Discuss with patient the symptoms still seem to be viral. Gave handout on symptomatic think she can do such as Mucinex. Patient was given prescription for nighttime cough syrup. She was also handed a antibiotic if symptoms do not improve in the next 3-5 days or if symptoms worsened.

## 2014-04-18 NOTE — Addendum Note (Signed)
Addended by: Sherrie George F on: 04/18/2014 11:53 AM   Modules accepted: Orders

## 2014-04-23 ENCOUNTER — Encounter (HOSPITAL_BASED_OUTPATIENT_CLINIC_OR_DEPARTMENT_OTHER): Payer: Self-pay | Admitting: Emergency Medicine

## 2014-04-23 ENCOUNTER — Ambulatory Visit (HOSPITAL_BASED_OUTPATIENT_CLINIC_OR_DEPARTMENT_OTHER)
Admission: RE | Admit: 2014-04-23 | Discharge: 2014-04-23 | Disposition: A | Discharge status: Home / Self Care | Payer: 59 | Source: Ambulatory Visit | Attending: Family Medicine | Admitting: Family Medicine

## 2014-04-23 ENCOUNTER — Emergency Department (HOSPITAL_BASED_OUTPATIENT_CLINIC_OR_DEPARTMENT_OTHER)
Admission: EM | Admit: 2014-04-23 | Discharge: 2014-04-23 | Disposition: A | Discharge status: Home / Self Care | Payer: 59 | Attending: Emergency Medicine | Admitting: Emergency Medicine

## 2014-04-23 DIAGNOSIS — Z87448 Personal history of other diseases of urinary system: Secondary | ICD-10-CM | POA: Diagnosis not present

## 2014-04-23 DIAGNOSIS — Z791 Long term (current) use of non-steroidal anti-inflammatories (NSAID): Secondary | ICD-10-CM | POA: Diagnosis not present

## 2014-04-23 DIAGNOSIS — R21 Rash and other nonspecific skin eruption: Secondary | ICD-10-CM | POA: Diagnosis present

## 2014-04-23 DIAGNOSIS — Z79899 Other long term (current) drug therapy: Secondary | ICD-10-CM | POA: Diagnosis not present

## 2014-04-23 DIAGNOSIS — Z8719 Personal history of other diseases of the digestive system: Secondary | ICD-10-CM | POA: Insufficient documentation

## 2014-04-23 DIAGNOSIS — Z720 Tobacco use: Secondary | ICD-10-CM | POA: Insufficient documentation

## 2014-04-23 DIAGNOSIS — F419 Anxiety disorder, unspecified: Secondary | ICD-10-CM | POA: Diagnosis not present

## 2014-04-23 DIAGNOSIS — T508X5A Adverse effect of diagnostic agents, initial encounter: Secondary | ICD-10-CM | POA: Insufficient documentation

## 2014-04-23 DIAGNOSIS — M5010 Cervical disc disorder with radiculopathy, unspecified cervical region: Secondary | ICD-10-CM | POA: Insufficient documentation

## 2014-04-23 DIAGNOSIS — Z792 Long term (current) use of antibiotics: Secondary | ICD-10-CM | POA: Diagnosis not present

## 2014-04-23 DIAGNOSIS — Z8669 Personal history of other diseases of the nervous system and sense organs: Secondary | ICD-10-CM | POA: Diagnosis not present

## 2014-04-23 MED ORDER — DIPHENHYDRAMINE HCL 25 MG PO CAPS
50.0000 mg | ORAL_CAPSULE | Freq: Once | ORAL | Status: AC
  Administered 2014-04-23: 50 mg via ORAL
  Filled 2014-04-23: qty 2

## 2014-04-23 NOTE — ED Notes (Signed)
MD at bedside. 

## 2014-04-23 NOTE — ED Provider Notes (Signed)
CSN: [id number]     Arrival date & time 04/23/14  1616 History   First MD Initiated Contact with Patient 04/23/14 1631     Chief Complaint  Patient presents with  . Allergic Reaction     (Consider location/radiation/quality/duration/timing/severity/associated sxs/prior Treatment) HPI  38 year old female presents after having flushing and chest redness after getting contrast during an MRI of her neck. This occurred just prior to arrival. Occurred within a few minutes of getting the contrast. The patient denies any short of breath but feels like her mouth is dry. No trouble speaking. No tongue or lip swelling. Feels like her cheeks and chest are read and hot. No rash anywhere else. No itching.  Past Medical History  Diagnosis Date  . Anxiety   . Headache(784.0)   . IBS (irritable bowel syndrome)     questionable, per pt; saw Dr. Collene Mares  . IC (interstitial cystitis)   . Insomnia    Past Surgical History  Procedure Laterality Date  . Cesarean section    . Pelvic laparoscopy  2003    endometriosis  . Ovarian cyst removal    . Dilation and evacuation N/A 05/07/2013    Procedure: DILATATION AND EVACUATION with tissue sent for chromosome analysis;  Surgeon: Lovenia Kim, MD;  Location: [facility];  Service: Gynecology;  Laterality: N/A;  . Cervical spine surgery     Family History  Problem Relation Age of Onset  . Heart attack Father    History  Substance Use Topics  . Smoking status: Current Some Day Smoker -- 0.30 packs/day    Types: Cigarettes  . Smokeless tobacco: Never Used  . Alcohol Use: No   OB History    No data available     Review of Systems  HENT: Negative for facial swelling, trouble swallowing and voice change.   Respiratory: Negative for shortness of breath and wheezing.   Gastrointestinal: Negative for vomiting.  Skin: Positive for rash.  All other systems reviewed and are negative.     Allergies  Eggs or egg-derived products and Multihance  Home  Medications   Prior to Admission medications   Medication Sig Start Date End Date Taking? Authorizing Provider  albuterol (PROVENTIL HFA;VENTOLIN HFA) 108 (90 BASE) MCG/ACT inhaler Inhale 2 puffs into the lungs every 4 (four) hours as needed for wheezing. 04/14/14 04/14/15  Donella Stade, PA-C  ALPRAZolam (XANAX) 1 MG tablet Take 1 tablet (1 mg total) by mouth 2 (two) times daily as needed for sleep. 04/01/14   Jade L Breeback, PA-C  AMBULATORY NON FORMULARY MEDICATION For PT: -active cervical ROM -scapular retraction exercises -deep neck flexor strengthen exercises -cervical spine muscle stretches strengthen of periscapular muscles and thoracic extensors  *avoid any exercises that reproduces radicular symptoms  Dx-left upper cervical radiuculitis 01/17/14   Jade L Breeback, PA-C  amoxicillin-clavulanate (AUGMENTIN) 875-125 MG per tablet Take 1 tablet by mouth 2 (two) times daily. 04/14/14   Jade L Breeback, PA-C  celecoxib (CELEBREX) 200 MG capsule Take 1 capsule (200 mg total) by mouth 2 (two) times daily. 01/15/14   Jade L Breeback, PA-C  diazepam (VALIUM) 5 MG tablet Take 10 mg by mouth every 8 (eight) hours as needed for anxiety.    Historical Provider, MD  diclofenac sodium (VOLTAREN) 1 % GEL Apply 4 g topically 4 (four) times daily. 04/01/14   Jade L Breeback, PA-C  DULoxetine (CYMBALTA) 30 MG capsule Start 1 tablet for 7 days then increase to 2 tablets daily. 04/01/14  Jade L Breeback, PA-C  HYDROcodone-acetaminophen (NORCO) 7.5-325 MG per tablet Take 1 tablet by mouth every 8 (eight) hours as needed for moderate pain (cough). 02/05/14   Jade L Breeback, PA-C  HYDROcodone-homatropine (HYCODAN) 5-1.5 MG/5ML syrup Take 5 mLs by mouth at bedtime as needed. 04/14/14   Jade L Breeback, PA-C  hydrOXYzine (ATARAX/VISTARIL) 10 MG tablet Take 10 mg by mouth 3 (three) times daily as needed for itching.    Historical Provider, MD  ibuprofen (ADVIL,MOTRIN) 800 MG tablet Take 800 mg by mouth every 8  (eight) hours as needed for mild pain or moderate pain.    Historical Provider, MD  ketoprofen (ORUDIS) 50 MG capsule Take 1 capsule (50 mg total) by mouth every 8 (eight) hours as needed. 04/09/14   Jade L Breeback, PA-C  ketorolac (TORADOL) 10 MG tablet Take 1 tablet (10 mg total) by mouth every 6 (six) hours as needed. 04/11/14   Jade L Breeback, PA-C  lidocaine (XYLOCAINE) 5 % ointment Apply 1 application topically every 8 (eight) hours as needed. 02/05/14   Jade L Breeback, PA-C  Linaclotide (LINZESS) 145 MCG CAPS capsule Take 1 capsule (145 mcg total) by mouth daily. 01/15/14   Jade L Breeback, PA-C  methocarbamol (ROBAXIN) 500 MG tablet Take 1 tablet (500 mg total) by mouth 3 (three) times daily. 02/05/14   Jade L Breeback, PA-C  Naloxegol Oxalate (MOVANTIK) 25 MG TABS Take 25 mg by mouth daily. 01/15/14   Jade L Breeback, PA-C  promethazine (PHENERGAN) 25 MG tablet TAKE 1 TABLET (25 MG TOTAL) BY MOUTH EVERY 6 (SIX) HOURS AS NEEDED FOR NAUSEA OR VOMITING. 03/10/14   Jade L Breeback, PA-C  valACYclovir (VALTREX) 1000 MG tablet Take 1 tablet (1,000 mg total) by mouth 3 (three) times daily. 02/05/14   Jade L Breeback, PA-C  zolpidem (AMBIEN) 10 MG tablet Take 1 tablet (10 mg total) by mouth at bedtime as needed for sleep. 03/31/14   Jade L Breeback, PA-C   BP 139/82 mmHg  Pulse 86  Temp(Src) 98.6 F (37 C) (Oral)  Resp 16  Ht 5\' 3"  (1.6 m)  Wt 128 lb (58.06 kg)  BMI 22.68 kg/m2  SpO2 99%  LMP 04/03/2014 (Exact Date) Physical Exam  Constitutional: She is oriented to person, place, and time. She appears well-developed and well-nourished. No distress.  HENT:  Head: Normocephalic and atraumatic.  Right Ear: External ear normal.  Left Ear: External ear normal.  Nose: Nose normal.  Mouth/Throat: Oropharynx is clear and moist.  Flushed cheeks bilaterally. Oropharynx clear and moist. Normal sized tongue and lips. Normal voice  Eyes: Right eye exhibits no discharge. Left eye exhibits no discharge.    Cardiovascular: Normal rate, regular rhythm and normal heart sounds.   Pulmonary/Chest: Effort normal and breath sounds normal. No stridor. She has no wheezes.  Abdominal: Soft. There is no tenderness.  Neurological: She is alert and oriented to person, place, and time.  Skin: Skin is warm and dry. Rash noted. She is not diaphoretic.     Nursing note and vitals reviewed.   ED Course  Procedures (including critical care time) Labs Review Labs Reviewed - No data to display  Imaging Review No results found.   EKG Interpretation None      MDM   Final diagnoses:  Allergic reaction to contrast material, initial encounter    Patient with mild flushing chest after contrast from MRI. No wheezing, respiratory distress, or oral swelling. No rash anywhere else. Could be a localized reaction  to the contrast versus an allergic reaction that is mild. All symptoms resolved with Benadryl. Discussed that if she is to receive contrast in the future she needs to tell attacks this and may need Benadryl prior.    Ephraim Hamburger, MD 04/23/14 [phone]

## 2014-04-23 NOTE — Discharge Instructions (Signed)
Drug Allergy °Allergic reactions to medicines are common. Some allergic reactions are mild. A delayed type of drug allergy that occurs 1 week or more after exposure to a medicine or vaccine is called serum sickness. A life-threatening, sudden (acute) allergic reaction that involves the whole body is called anaphylaxis. °CAUSES  °"True" drug allergies occur when there is an allergic reaction to a medicine. This is caused by overactivity of the immune system. First, the body becomes sensitized. The immune system is triggered by your first exposure to the medicine. Following this first exposure, future exposure to the same medicine may be life-threatening. °Almost any medicine can cause an allergic reaction. Common ones are: °· Penicillin. °· Sulfonamides (sulfa drugs). °· Local anesthetics. °· X-ray dyes that contain iodine. °SYMPTOMS  °Common symptoms of a minor allergic reaction are: °· Swelling around the mouth. °· An itchy red rash or hives. °· Vomiting or diarrhea. °Anaphylaxis can cause swelling of the mouth and throat. This makes it difficult to breathe and swallow. Severe reactions can be fatal within seconds, even after exposure to only a trace amount of the drug that causes the reaction. °HOME CARE INSTRUCTIONS  °· If you are unsure of what caused your reaction, keep a diary of foods and medicines used. Include the symptoms that followed. Avoid anything that causes reactions. °· You may want to follow up with an allergy specialist after the reaction has cleared in order to be tested to confirm the allergy. It is important to confirm that your reaction is an allergy, not just a side effect to the medicine. If you have a true allergy to a medicine, this may prevent that medicine and related medicines from being given to you when you are very ill. °· If you have hives or a rash: °¨ Take medicines as directed by your caregiver. °¨ You may use an over-the-counter antihistamine (diphenhydramine) as  needed. °¨ Apply cold compresses to the skin or take baths in cool water. Avoid hot baths or showers. °· If you are severely allergic: °¨ Continuous observation after a severe reaction may be needed. Hospitalization is often required. °¨ Wear a medical alert bracelet or necklace stating your allergy. °¨ You and your family must learn how to use an anaphylaxis kit or give an epinephrine injection to temporarily treat an emergency allergic reaction. If you have had a severe reaction, always carry your epinephrine injection or anaphylaxis kit with you. This can be lifesaving if you have a severe reaction. °· Do not drive or perform tasks after treatment until the medicines used to treat your reaction have worn off, or until your caregiver says it is okay. °SEEK MEDICAL CARE IF:  °· You think you had an allergic reaction. Symptoms usually start within 30 minutes after exposure. °· Symptoms are getting worse rather than better. °· You develop new symptoms. °· The symptoms that brought you to your caregiver return. °SEEK IMMEDIATE MEDICAL CARE IF:  °· You have swelling of the mouth, difficulty breathing, or wheezing. °· You have a tight feeling in your chest or throat. °· You develop hives, swelling, or itching all over your body. °· You develop severe vomiting or diarrhea. °· You feel faint or pass out. °This is an emergency. Use your epinephrine injection or anaphylaxis kit as you have been instructed. Call for emergency medical help. Even if you improve after the injection, you need to be examined at a hospital emergency department. °MAKE SURE YOU:  °· Understand these instructions. °· Will watch   your condition.  Will get help right away if you are not doing well or get worse. Document Released: 04/18/2005 Document Revised: 07/11/2011 Document Reviewed: 09/22/2010 [facility] Patient Information 2015 [place], [place]. This information is not intended to replace advice given to you by your health care provider. Make  sure you discuss any questions you have with your health care provider.

## 2014-04-23 NOTE — ED Notes (Signed)
Pt had MRI of neck, status post cervical fusion on 12/18/2013.  Sts she started feeling her cheeks burning and nose sweating when she started to get dressed.  Neck felt hot.  Redness on upper chest.  Area feels hot.  Denies itching.

## 2014-04-29 ENCOUNTER — Other Ambulatory Visit: Payer: Self-pay | Admitting: Physician Assistant

## 2014-04-29 ENCOUNTER — Encounter: Payer: Self-pay | Admitting: Physician Assistant

## 2014-04-29 ENCOUNTER — Ambulatory Visit (INDEPENDENT_AMBULATORY_CARE_PROVIDER_SITE_OTHER): Payer: 59 | Admitting: Physician Assistant

## 2014-04-29 VITALS — BP 112/77 | HR 89 | Ht 63.0 in | Wt 129.0 lb

## 2014-04-29 DIAGNOSIS — R05 Cough: Secondary | ICD-10-CM

## 2014-04-29 DIAGNOSIS — B029 Zoster without complications: Secondary | ICD-10-CM

## 2014-04-29 DIAGNOSIS — J209 Acute bronchitis, unspecified: Secondary | ICD-10-CM

## 2014-04-29 DIAGNOSIS — M5010 Cervical disc disorder with radiculopathy, unspecified cervical region: Secondary | ICD-10-CM

## 2014-04-29 DIAGNOSIS — R059 Cough, unspecified: Secondary | ICD-10-CM

## 2014-04-29 MED ORDER — KETOROLAC TROMETHAMINE 60 MG/2ML IM SOLN
60.0000 mg | Freq: Once | INTRAMUSCULAR | Status: AC
  Administered 2014-04-29: 60 mg via INTRAMUSCULAR

## 2014-04-29 MED ORDER — HYDROCODONE-ACETAMINOPHEN 7.5-325 MG PO TABS: 1.0000 | ORAL_TABLET | Freq: Three times a day (TID) | ORAL | Status: DC | PRN

## 2014-04-29 MED ORDER — PREGABALIN 150 MG PO CAPS: 150.0000 mg | ORAL_CAPSULE | Freq: Two times a day (BID) | ORAL | Status: DC

## 2014-04-29 MED ORDER — AZITHROMYCIN 250 MG PO TABS: ORAL_TABLET | ORAL | Status: DC

## 2014-04-29 NOTE — Progress Notes (Signed)
   Subjective:    Patient ID: [name], female    DOB: 1976-03-05, 38 y.o.   MRN: [id number]  HPI Pt presents to the clinic to follow up from ER on 12/28 for shingles and left cervical radiculitis. ER gave her valtrex for shingles but nothing else and would not give her pain medication. She called Dr. Valeria Batman office and he is out of office this week. Her pain is 8/10 burning and worse. She has stopped PT because feels like makes worse. She continues to use volataren gel which does help some. toradol shots have helped the most. She is on neurontin but just feels like not doing anything.   She also feels like she has never gotten over past sickness. She continues to have productive green sputum cough. She often has chills. No fever. No sinus pressure or ear pain. Her chest feels tight but no wheezing.    Review of Systems  All other systems reviewed and are negative.      Objective:   Physical Exam  Constitutional: She is oriented to person, place, and time. She appears well-developed and well-nourished.  HENT:  Head: Normocephalic and atraumatic.  Right Ear: External ear normal.  Left Ear: External ear normal.  Nose: Nose normal.  Mouth/Throat: Oropharynx is clear and moist.  Eyes: Conjunctivae are normal.  Neck: Normal range of motion. Neck supple.  Cardiovascular: Normal rate, regular rhythm and normal heart sounds.   Pulmonary/Chest: Effort normal and breath sounds normal. She has no wheezes.  Neurological: She is alert and oriented to person, place, and time.  Skin: Skin is dry.  Psychiatric: She has a normal mood and affect. Her behavior is normal.          Assessment & Plan:  Left cervical radiculitis- stop gabapentin. Start lyrica will try this. Will give a few norco. Shot of toradol 60mg  given today. Do not take oral toradol today. Continue cymbalta. Follow up with Dr. Ned Clines.   Shingles- continue valtrex. Appears to be resolving. Follow up as needed.    Acute bronchitis cough- zpak given. Symptomatic care discuss. Follow up as needed. Delsym for cough.

## 2014-04-29 NOTE — Patient Instructions (Signed)
Stop neurontin.  Start lyrica 75mg  bid for 1 week then increase to 150mg  bid.

## 2014-04-30 MED ORDER — DULOXETINE HCL 60 MG PO CPEP: 60.0000 mg | ORAL_CAPSULE | Freq: Every day | ORAL | Status: DC

## 2014-04-30 MED ORDER — LIDOCAINE 5 % EX OINT: 1.0000 "application " | TOPICAL_OINTMENT | Freq: Three times a day (TID) | CUTANEOUS | Status: DC | PRN

## 2014-05-01 ENCOUNTER — Encounter: Payer: Self-pay | Admitting: Family Medicine

## 2014-05-01 NOTE — Progress Notes (Signed)
Patient ID: [name], female   DOB: [birth year], 38 y.o.   MRN: [id number]  Patient's repeat Cervical spine MRI reviewed - patient notified no evidence of new impingement that could account for her symptoms.  The fluid collections have resolved and no enhancement to suggest abscesses post surgery.  She has had extensive workup and treatment for possible shoulder and cervical spine issues including recent NCVs/EMGs and MRIs of cervical spine and shoulder followed by trial of subacromial shoulder injection which did not provide relief.  My only recommendation at this point would be to consult with pain management to see if there is anything further they could offer her.  She is currently on Lyrica and has tried neurontin.

## 2014-05-05 ENCOUNTER — Other Ambulatory Visit: Payer: Self-pay | Admitting: Physician Assistant

## 2014-05-05 ENCOUNTER — Encounter: Payer: Self-pay | Admitting: Physician Assistant

## 2014-05-05 DIAGNOSIS — G5602 Carpal tunnel syndrome, left upper limb: Secondary | ICD-10-CM | POA: Insufficient documentation

## 2014-05-08 ENCOUNTER — Other Ambulatory Visit: Payer: Self-pay | Admitting: Orthopedic Surgery

## 2014-05-13 LAB — HM PAP SMEAR: HM Pap smear: NEGATIVE

## 2014-05-26 ENCOUNTER — Other Ambulatory Visit: Payer: Self-pay | Admitting: Physician Assistant

## 2014-05-26 MED ORDER — PREGABALIN 150 MG PO CAPS: 150.0000 mg | ORAL_CAPSULE | Freq: Two times a day (BID) | ORAL | Status: DC

## 2014-05-26 MED ORDER — KETOROLAC TROMETHAMINE 10 MG PO TABS: 10.0000 mg | ORAL_TABLET | Freq: Four times a day (QID) | ORAL | Status: DC

## 2014-05-26 NOTE — Telephone Encounter (Signed)
Jade, Is it ok to refill the Toradol?

## 2014-05-28 ENCOUNTER — Telehealth: Payer: Self-pay | Admitting: *Deleted

## 2014-05-28 NOTE — Telephone Encounter (Signed)
Patient called stating that she was advised by Rheumatologist to have a MRI done for the Left shoulder and should be ordered by orthopedic office. MRI has already been done for left shoulder.

## 2014-05-29 ENCOUNTER — Ambulatory Visit (INDEPENDENT_AMBULATORY_CARE_PROVIDER_SITE_OTHER): Payer: 59 | Admitting: Sports Medicine

## 2014-05-29 ENCOUNTER — Encounter: Payer: Self-pay | Admitting: Sports Medicine

## 2014-05-29 VITALS — BP 117/75 | HR 105 | Ht 63.0 in | Wt 133.0 lb

## 2014-05-29 DIAGNOSIS — M7918 Myalgia, other site: Secondary | ICD-10-CM | POA: Insufficient documentation

## 2014-05-29 DIAGNOSIS — M791 Myalgia: Secondary | ICD-10-CM

## 2014-05-29 MED ORDER — DULOXETINE HCL 30 MG PO CPEP: 30.0000 mg | ORAL_CAPSULE | Freq: Every day | ORAL | Status: DC

## 2014-05-29 MED ORDER — MELOXICAM 15 MG PO TABS: ORAL_TABLET | ORAL | Status: DC

## 2014-05-29 NOTE — Assessment & Plan Note (Addendum)
Elvis has been through a lot. She is post 2 level ACDF for left-sided cervical radiculopathy, with persistent pain in the neck, and left upper shoulder. She has had a postoperative MRI with and without IV contrast that shows good positioning of the plates, with only questionable mild residual left foraminal stenosis. She has had a shoulder MRI and a subacromial injection that was ineffective. The  skin over her left posterior shoulder is hyperesthetic. I do suspect at this point that she has a myofascial pain syndrome, she does not quite meet the criteria for fibromyalgia. There is also uncontrolled anxiety, and the patient admits that her anxiety worsens her pain. We discussed in detail the etiology of myofascial pain syndrome, and that there was no cure, that we could work to control her anxiety and use nonnarcotic medications to block her symptoms.  She was relieved, and eager to try this. We are going to do formal physical therapy, I'm going to increase her Cymbalta to 90 mg daily, she declined adding amitriptyline however we will add meloxicam and discontinue oral Toradol. She will work with Luvenia Starch on her anxiety, I would like her to also touch base downstairs with psychiatry. She does have a nerve conduction study coming up in the near future. 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 Return in one month.

## 2014-05-29 NOTE — Progress Notes (Signed)
   Subjective:    I'm seeing this patient as a consultation for:  Iran Planas, PA-C, Dr. 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 This all started with some left-sided numbness and tingling, she was diagnosed with cervical degenerative disc disease and taken straight to the operating room. He tells me that after her surgery she continued to have numbness and tingling into the left upper extremity, but also had some neck pain. She tells me the pain is extremely severe, and even the lightest touch over her posterior shoulder causes it. She has had a subsequent cervical spine MRI with IV contrast that showed good positioning of the ACDF plates, she has also had a shoulder MRI as well as a subacromial injection, none of which have improved her symptoms. She is currently taking 150 mg of Lyrica twice a day which is only minimally effective. She has tried amitriptyline in the past which made her gain too much weight. She does endorse that when she takes about the pain she gets flushed, and agrees that she may be developing panic attacks. Certainly her pain is worse during these episodes of anxiety. She worries that she will continue to have pain for the rest of her life. Past medical history, Surgical history, Family history not pertinant except as noted below, Social history, Allergies, and medications have been entered into the medical record, reviewed, and no changes needed.   Review of Systems: No headache, visual changes, nausea, vomiting, diarrhea, constipation, dizziness, abdominal pain, skin rash, fevers, chills, night sweats, weight loss, swollen lymph nodes, body aches, joint swelling, muscle aches, chest pain, shortness of breath, mood changes, visual or auditory hallucinations.   Objective:   General: Well Developed, well nourished,  and in no acute distress.  Neuro/Psych: Alert and oriented x3, extra-ocular muscles intact, able to move all 4 extremities, sensation grossly intact. Tearful in the exam room Skin: Warm and dry, no rashes noted.  Respiratory: Not using accessory muscles, speaking in full sentences, trachea midline.  Cardiovascular: Pulses palpable, no extremity edema. Abdomen: Does not appear distended. Neck: Negative spurling's Full neck range of motion Grip strength and sensation normal in bilateral hands Strength good C4 to T1 distribution No sensory change to C4 to T1 Reflexes normal Hyperesthesia to light touch in the periscapular muscles.  Impression and Recommendations:   This case required medical decision making of moderate complexity.

## 2014-05-30 ENCOUNTER — Ambulatory Visit: Payer: 59 | Admitting: Physician Assistant

## 2014-05-30 ENCOUNTER — Other Ambulatory Visit: Payer: Self-pay | Admitting: *Deleted

## 2014-05-30 ENCOUNTER — Encounter: Payer: Self-pay | Admitting: Physician Assistant

## 2014-05-30 ENCOUNTER — Telehealth: Payer: Self-pay

## 2014-05-30 MED ORDER — ALPRAZOLAM 1 MG PO TABS: ORAL_TABLET | ORAL | Status: DC

## 2014-05-30 MED ORDER — SUVOREXANT 10 MG PO TABS: 10.0000 mg | ORAL_TABLET | Freq: Every evening | ORAL | Status: DC | PRN

## 2014-05-30 MED ORDER — DICLOFENAC SODIUM 1 % TD GEL: 4.0000 g | Freq: Four times a day (QID) | TRANSDERMAL | Status: DC

## 2014-05-30 NOTE — Telephone Encounter (Signed)
Patient called requested a work note for however long she needs to be out of work. Note to be faxed to [employer] @ [phone]. Patient wants letter dated for 05/29/2014. Can Lucci,CMA

## 2014-05-30 NOTE — Telephone Encounter (Signed)
Before I write a note, it sounds as though she was out on either FMLA or short-term disability. Technically there is no medical reason for her not to work, would she be okay with some limitations/restrictions initially, before ramping her back up to full time?  I'm happy to give her medication some time to work before we put her back in.

## 2014-06-02 ENCOUNTER — Encounter: Payer: Self-pay | Admitting: Sports Medicine

## 2014-06-02 ENCOUNTER — Ambulatory Visit (INDEPENDENT_AMBULATORY_CARE_PROVIDER_SITE_OTHER): Payer: 59 | Admitting: Physician Assistant

## 2014-06-02 ENCOUNTER — Encounter: Payer: Self-pay | Admitting: Physician Assistant

## 2014-06-02 VITALS — BP 119/71 | HR 101 | Ht 63.0 in | Wt 131.0 lb

## 2014-06-02 DIAGNOSIS — R21 Rash and other nonspecific skin eruption: Secondary | ICD-10-CM

## 2014-06-02 MED ORDER — TRIAMCINOLONE ACETONIDE 0.1 % EX CREA: 1.0000 "application " | TOPICAL_CREAM | Freq: Two times a day (BID) | CUTANEOUS | Status: DC

## 2014-06-02 NOTE — Telephone Encounter (Signed)
Spoke to patient and she is ok with a note with the limitations or restrictions and faxed to [employer] she also want to give the medication time to work. Carynn Felling,CMA

## 2014-06-02 NOTE — Telephone Encounter (Signed)
Letter printed out

## 2014-06-02 NOTE — Telephone Encounter (Signed)
Letter faxed to [employer]. Jerita Wimbush,CMA

## 2014-06-03 ENCOUNTER — Encounter (INDEPENDENT_AMBULATORY_CARE_PROVIDER_SITE_OTHER): Payer: Self-pay | Admitting: Neurology

## 2014-06-03 ENCOUNTER — Encounter (HOSPITAL_BASED_OUTPATIENT_CLINIC_OR_DEPARTMENT_OTHER): Payer: Self-pay | Admitting: *Deleted

## 2014-06-03 ENCOUNTER — Ambulatory Visit (INDEPENDENT_AMBULATORY_CARE_PROVIDER_SITE_OTHER): Payer: 59 | Admitting: Neurology

## 2014-06-03 DIAGNOSIS — Z0289 Encounter for other administrative examinations: Secondary | ICD-10-CM

## 2014-06-03 DIAGNOSIS — R202 Paresthesia of skin: Secondary | ICD-10-CM | POA: Insufficient documentation

## 2014-06-03 DIAGNOSIS — M542 Cervicalgia: Secondary | ICD-10-CM
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
## 2014-06-04 NOTE — Progress Notes (Signed)
   Subjective:    Patient ID: [name], female    DOB: 1975-08-30, 39 y.o.   MRN: [id number]  HPI  Pt presents to the clinic with rash on left inner thigh. She has hx of shingles. Rash is tiny red papules. Rash mostly itches but also burns a little. Not tried anything to make better. Nothing makes worse. Noticed last night while giving dog a bath. No new detergents or products.   Review of Systems  All other systems reviewed and are negative.      Objective:   Physical Exam  Skin:  Tiny red papules with some surrounding erythema of her left inner thigh. No vesicles or open lesions. No abscess. Skin not warm to touch.          Assessment & Plan:  Rash-discuss with patient appears to be some type of contact dermatitis. Reassured patient it was not shingles and did not appear to be herpes. Triamcinolone cream was given to use twice daily for the next week or so as needed for itch. There could be something she is coming in contact with causing this rash. Evaluate any detergents or lotions she is using. Follow-up if not improving or worsening.

## 2014-06-05 ENCOUNTER — Encounter (HOSPITAL_BASED_OUTPATIENT_CLINIC_OR_DEPARTMENT_OTHER): Payer: Self-pay | Admitting: Orthopedic Surgery

## 2014-06-05 ENCOUNTER — Ambulatory Visit (HOSPITAL_BASED_OUTPATIENT_CLINIC_OR_DEPARTMENT_OTHER): Payer: 59 | Admitting: Certified Registered"

## 2014-06-05 ENCOUNTER — Telehealth: Payer: Self-pay

## 2014-06-05 ENCOUNTER — Encounter (HOSPITAL_BASED_OUTPATIENT_CLINIC_OR_DEPARTMENT_OTHER)
Admission: RE | Disposition: A | Discharge status: Home / Self Care | Payer: Self-pay | Source: Ambulatory Visit | Attending: Orthopedic Surgery

## 2014-06-05 ENCOUNTER — Ambulatory Visit (HOSPITAL_BASED_OUTPATIENT_CLINIC_OR_DEPARTMENT_OTHER)
Admission: RE | Admit: 2014-06-05 | Discharge: 2014-06-05 | Disposition: A | Discharge status: Home / Self Care | Payer: 59 | Source: Ambulatory Visit | Attending: Orthopedic Surgery | Admitting: Orthopedic Surgery

## 2014-06-05 DIAGNOSIS — F17210 Nicotine dependence, cigarettes, uncomplicated: Secondary | ICD-10-CM | POA: Diagnosis not present

## 2014-06-05 DIAGNOSIS — G5602 Carpal tunnel syndrome, left upper limb: Secondary | ICD-10-CM | POA: Diagnosis not present

## 2014-06-05 DIAGNOSIS — Z791 Long term (current) use of non-steroidal anti-inflammatories (NSAID): Secondary | ICD-10-CM | POA: Diagnosis not present

## 2014-06-05 DIAGNOSIS — G4700 Insomnia, unspecified: Secondary | ICD-10-CM | POA: Diagnosis not present

## 2014-06-05 DIAGNOSIS — Z79899 Other long term (current) drug therapy: Secondary | ICD-10-CM | POA: Diagnosis not present

## 2014-06-05 DIAGNOSIS — K589 Irritable bowel syndrome without diarrhea: Secondary | ICD-10-CM | POA: Insufficient documentation

## 2014-06-05 DIAGNOSIS — F329 Major depressive disorder, single episode, unspecified: Secondary | ICD-10-CM | POA: Diagnosis not present

## 2014-06-05 DIAGNOSIS — M79601 Pain in right arm: Secondary | ICD-10-CM | POA: Diagnosis present

## 2014-06-05 DIAGNOSIS — F419 Anxiety disorder, unspecified: Secondary | ICD-10-CM | POA: Diagnosis not present

## 2014-06-05 DIAGNOSIS — M79602 Pain in left arm: Secondary | ICD-10-CM | POA: Diagnosis present

## 2014-06-05 HISTORY — PX: CARPAL TUNNEL RELEASE: SHX101

## 2014-06-05 LAB — POCT HEMOGLOBIN-HEMACUE: HEMOGLOBIN: 13.8 g/dL (ref 12.0–15.0)

## 2014-06-05 SURGERY — CARPAL TUNNEL RELEASE
Anesthesia: General | Site: Wrist | Laterality: Left

## 2014-06-05 MED ORDER — FENTANYL CITRATE 0.05 MG/ML IJ SOLN
INTRAMUSCULAR | Status: DC | PRN
  Administered 2014-06-05: 50 ug via INTRAVENOUS

## 2014-06-05 MED ORDER — MIDAZOLAM HCL 2 MG/2ML IJ SOLN
INTRAMUSCULAR | Status: AC
  Filled 2014-06-05: qty 2

## 2014-06-05 MED ORDER — FENTANYL CITRATE 0.05 MG/ML IJ SOLN: 50.0000 ug | INTRAMUSCULAR | Status: DC | PRN

## 2014-06-05 MED ORDER — DEXAMETHASONE SODIUM PHOSPHATE 10 MG/ML IJ SOLN
INTRAMUSCULAR | Status: DC | PRN
  Administered 2014-06-05: 10 mg via INTRAVENOUS

## 2014-06-05 MED ORDER — CHLORHEXIDINE GLUCONATE 4 % EX LIQD: 60.0000 mL | Freq: Once | CUTANEOUS | Status: DC

## 2014-06-05 MED ORDER — CEFAZOLIN SODIUM-DEXTROSE 2-3 GM-% IV SOLR
2.0000 g | INTRAVENOUS | Status: AC
  Administered 2014-06-05: 2 g via INTRAVENOUS

## 2014-06-05 MED ORDER — FENTANYL CITRATE 0.05 MG/ML IJ SOLN
INTRAMUSCULAR | Status: AC
  Filled 2014-06-05: qty 2

## 2014-06-05 MED ORDER — HYDROMORPHONE HCL 1 MG/ML IJ SOLN
0.2500 mg | INTRAMUSCULAR | Status: DC | PRN
  Administered 2014-06-05 (×2): 0.5 mg via INTRAVENOUS

## 2014-06-05 MED ORDER — OXYCODONE HCL 5 MG/5ML PO SOLN: 5.0000 mg | Freq: Once | ORAL | Status: AC | PRN

## 2014-06-05 MED ORDER — OXYCODONE HCL 5 MG PO TABS
5.0000 mg | ORAL_TABLET | Freq: Once | ORAL | Status: AC | PRN
  Administered 2014-06-05: 5 mg via ORAL

## 2014-06-05 MED ORDER — MIDAZOLAM HCL 2 MG/2ML IJ SOLN
1.0000 mg | INTRAMUSCULAR | Status: DC | PRN
Start: 2014-06-05 — End: 2014-06-05

## 2014-06-05 MED ORDER — BUPIVACAINE HCL (PF) 0.25 % IJ SOLN
INTRAMUSCULAR | Status: DC | PRN
  Administered 2014-06-05: 7 mL

## 2014-06-05 MED ORDER — LACTATED RINGERS IV SOLN
INTRAVENOUS | Status: DC
  Administered 2014-06-05: 10:00:00 via INTRAVENOUS

## 2014-06-05 MED ORDER — CEFAZOLIN SODIUM-DEXTROSE 2-3 GM-% IV SOLR: 2.0000 g | INTRAVENOUS | Status: DC

## 2014-06-05 MED ORDER — OXYCODONE HCL 5 MG PO TABS
ORAL_TABLET | ORAL | Status: AC
  Filled 2014-06-05: qty 1

## 2014-06-05 MED ORDER — HYDROMORPHONE HCL 1 MG/ML IJ SOLN
INTRAMUSCULAR | Status: AC
  Filled 2014-06-05: qty 1

## 2014-06-05 MED ORDER — ONDANSETRON HCL 4 MG/2ML IJ SOLN
INTRAMUSCULAR | Status: DC | PRN
  Administered 2014-06-05: 4 mg via INTRAVENOUS

## 2014-06-05 MED ORDER — LIDOCAINE HCL (CARDIAC) 20 MG/ML IV SOLN
INTRAVENOUS | Status: DC | PRN
  Administered 2014-06-05: 60 mg via INTRAVENOUS

## 2014-06-05 MED ORDER — MIDAZOLAM HCL 5 MG/5ML IJ SOLN
INTRAMUSCULAR | Status: DC | PRN
  Administered 2014-06-05: 2 mg via INTRAVENOUS

## 2014-06-05 MED ORDER — PROPOFOL 10 MG/ML IV BOLUS
INTRAVENOUS | Status: DC | PRN
  Administered 2014-06-05: 240 mg via INTRAVENOUS

## 2014-06-05 MED ORDER — ONDANSETRON HCL 4 MG/2ML IJ SOLN: 4.0000 mg | Freq: Once | INTRAMUSCULAR | Status: DC | PRN

## 2014-06-05 MED ORDER — HYDROCODONE-ACETAMINOPHEN 5-325 MG PO TABS: 1.0000 | ORAL_TABLET | Freq: Four times a day (QID) | ORAL | Status: DC | PRN

## 2014-06-05 SURGICAL SUPPLY — 37 items
BLADE SURG 15 STRL LF DISP TIS (BLADE) ×1 IMPLANT
BLADE SURG 15 STRL SS (BLADE) ×2
BNDG CMPR 9X4 STRL LF SNTH (GAUZE/BANDAGES/DRESSINGS) ×1
BNDG COHESIVE 3X5 TAN STRL LF (GAUZE/BANDAGES/DRESSINGS) ×2 IMPLANT
BNDG ESMARK 4X9 LF (GAUZE/BANDAGES/DRESSINGS) ×2 IMPLANT
BNDG GAUZE ELAST 4 BULKY (GAUZE/BANDAGES/DRESSINGS) ×2 IMPLANT
CHLORAPREP W/TINT 26ML (MISCELLANEOUS) ×2 IMPLANT
CORDS BIPOLAR (ELECTRODE) ×2 IMPLANT
COVER BACK TABLE 60X90IN (DRAPES) ×2 IMPLANT
COVER MAYO STAND STRL (DRAPES) ×2 IMPLANT
CUFF TOURNIQUET SINGLE 18IN (TOURNIQUET CUFF) ×2 IMPLANT
DRAPE EXTREMITY T 121X128X90 (DRAPE) ×2 IMPLANT
DRAPE SURG 17X23 STRL (DRAPES) ×2 IMPLANT
DRSG PAD ABDOMINAL 8X10 ST (GAUZE/BANDAGES/DRESSINGS) ×2 IMPLANT
GAUZE SPONGE 4X4 12PLY STRL (GAUZE/BANDAGES/DRESSINGS) ×4 IMPLANT
GAUZE XEROFORM 1X8 LF (GAUZE/BANDAGES/DRESSINGS) ×2 IMPLANT
GLOVE BIOGEL PI IND STRL 6.5 (GLOVE) ×1 IMPLANT
GLOVE BIOGEL PI IND STRL 7.0 (GLOVE) ×1 IMPLANT
GLOVE BIOGEL PI IND STRL 8.5 (GLOVE) ×1 IMPLANT
GLOVE BIOGEL PI INDICATOR 6.5 (GLOVE) ×1
GLOVE BIOGEL PI INDICATOR 7.0 (GLOVE) ×1
GLOVE BIOGEL PI INDICATOR 8.5 (GLOVE) ×1
GLOVE EXAM NITRILE MD LF STRL (GLOVE) ×2 IMPLANT
GLOVE SURG ORTHO 8.0 STRL STRW (GLOVE) ×2 IMPLANT
GOWN STRL REUS W/ TWL LRG LVL3 (GOWN DISPOSABLE) ×1 IMPLANT
GOWN STRL REUS W/TWL LRG LVL3 (GOWN DISPOSABLE) ×2
GOWN STRL REUS W/TWL XL LVL3 (GOWN DISPOSABLE) ×2 IMPLANT
NEEDLE PRECISIONGLIDE 27X1.5 (NEEDLE) ×2 IMPLANT
NS IRRIG 1000ML POUR BTL (IV SOLUTION) ×2 IMPLANT
PACK BASIN DAY SURGERY FS (CUSTOM PROCEDURE TRAY) ×2 IMPLANT
STOCKINETTE 4X48 STRL (DRAPES) ×2 IMPLANT
SUT ETHILON 4 0 PS 2 18 (SUTURE) ×2 IMPLANT
SUT VICRYL 4-0 PS2 18IN ABS (SUTURE) IMPLANT
SYR BULB 3OZ (MISCELLANEOUS) ×2 IMPLANT
SYR CONTROL 10ML LL (SYRINGE) ×2 IMPLANT
TOWEL OR 17X24 6PK STRL BLUE (TOWEL DISPOSABLE) ×2 IMPLANT
UNDERPAD 30X30 INCONTINENT (UNDERPADS AND DIAPERS) ×2 IMPLANT

## 2014-06-05 NOTE — Telephone Encounter (Signed)
Patient called she had surgery on 06/05/2014 she  requested some changes to her letter that was written for her to be on restrictions for a month by Dr. Darene Lamer I advised patient to keep her follow up appt with him and if she needed other restrictions from what the surgeon did that she needed to contact their office for further restrictions regarding the surgery. Rhonda Cunningham,CMA

## 2014-06-05 NOTE — Transfer of Care (Signed)
Immediate Anesthesia Transfer of Care Note  Patient: [name]  Procedure(s) Performed: Procedure(s): LEFT CARPAL TUNNEL RELEASE (Left)  Patient Location: PACU  Anesthesia Type:General  Level of Consciousness: awake and patient cooperative  Airway & Oxygen Therapy: Patient Spontanous Breathing and Patient connected to face mask oxygen  Post-op Assessment: Report given to RN and Post -op Vital signs reviewed and stable  Post vital signs: Reviewed and stable  Last Vitals:  Filed Vitals:   06/05/14 0955  BP: 129/74  Pulse: 87  Temp: 36.6 C  Resp: 20    Complications: No apparent anesthesia complications

## 2014-06-05 NOTE — Anesthesia Procedure Notes (Signed)
Procedure Name: LMA Insertion Date/Time: 06/05/2014 10:15 AM Performed by: Clary Boulais Pre-anesthesia Checklist: Patient identified, Emergency Drugs available, Suction available and Patient being monitored Patient Re-evaluated:Patient Re-evaluated prior to inductionOxygen Delivery Method: Circle System Utilized Preoxygenation: Pre-oxygenation with 100% oxygen Intubation Type: IV induction Ventilation: Mask ventilation without difficulty LMA: LMA inserted LMA Size: 4.0 Number of attempts: 1 Airway Equipment and Method: Bite block Placement Confirmation: positive ETCO2 Tube secured with: Tape Dental Injury: Teeth and Oropharynx as per pre-operative assessment

## 2014-06-05 NOTE — Brief Op Note (Signed)
06/05/2014  10:55 AM  PATIENT:  [name]  39 y.o. female  PRE-OPERATIVE DIAGNOSIS:  LEFT CARPLA TUNNEL SYNDROME  POST-OPERATIVE DIAGNOSIS:  LEFT CARPLA TUNNEL SYNDROME  PROCEDURE:  Procedure(s): LEFT CARPAL TUNNEL RELEASE (Left)  SURGEON:  Surgeon(s) and Role:    * Daryll Brod, MD - Primary  PHYSICIAN ASSISTANT:   ASSISTANTS: none   ANESTHESIA:   local and general  EBL:     BLOOD ADMINISTERED:none  DRAINS: none   LOCAL MEDICATIONS USED:  BUPIVICAINE   SPECIMEN:  No Specimen  DISPOSITION OF SPECIMEN:  N/A  COUNTS:  YES  TOURNIQUET:   Total Tourniquet Time Documented: Upper Arm (Left) - 13 minutes Total: Upper Arm (Left) - 13 minutes   DICTATION: .Other Dictation: Dictation Number [id number]  PLAN OF CARE: Discharge to home after PACU  PATIENT DISPOSITION:  PACU - hemodynamically stable.
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
## 2014-06-05 NOTE — Discharge Instructions (Signed)

## 2014-06-05 NOTE — Anesthesia Postprocedure Evaluation (Signed)
  Anesthesia Post-op Note  Patient: [name]  Procedure(s) Performed: Procedure(s): LEFT CARPAL TUNNEL RELEASE (Left)  Patient Location: PACU  Anesthesia Type: General   Level of Consciousness: awake, alert  and oriented  Airway and Oxygen Therapy: Patient Spontanous Breathing  Post-op Pain: mild  Post-op Assessment: Post-op Vital signs reviewed  Post-op Vital Signs: Reviewed  Last Vitals:  Filed Vitals:   06/05/14 1145  BP: 137/92  Pulse: 104  Temp: 36.5 C  Resp: 20    Complications: No apparent anesthesia complications

## 2014-06-05 NOTE — Op Note (Signed)
NAMEANAMARIA, DUSENBURY      ACCOUNT NO.:  [id number]  MEDICAL RECORD NO.:  [id number]  LOCATION:                                 FACILITY:  PHYSICIAN:  Daryll Brod, M.D.       DATE OF BIRTH:  December 10, 1975  DATE OF PROCEDURE:  06/05/2014 DATE OF DISCHARGE:                              OPERATIVE REPORT   PREOPERATIVE DIAGNOSIS:  Carpal tunnel syndrome, left hand.  POSTOPERATIVE DIAGNOSIS:  Carpal tunnel syndrome, left hand.  OPERATION:  Decompression of left median nerve.  SURGEON:  Daryll Brod, M.D.  ASSISTANT:  None.  ANESTHESIA:  General with local infiltration.  ANESTHESIOLOGIST:  Lorrene Reid, M.D.  HISTORY:  The patient is a 39 year old female with a history of carpal tunnel syndrome, nerve conduction is positive.  She has undergone back surgery in the past, but has had continued numbness and tingling.  She has responded to injections, although not complete.  She is aware that there is no guarantee with the surgery; possibility of infection; recurrence of injury to arteries, nerves, tendons; incomplete relief of symptoms; dystrophy.  In the preoperative area, the patient was seen, the extremity marked by both the patient and surgeon.  Antibiotic given.  DESCRIPTION OF PROCEDURE:  The patient was brought to the operating room where a general anesthetic was carried out without difficulty.  She was prepped using ChloraPrep, supine position, left arm free.  A 3-minute dry time was allowed.  Time-out taken, confirming the patient and procedure.  The limb was exsanguinated with an Esmarch bandage. Tourniquet was placed high on the arm, was inflated to 250 mmHg.  A longitudinal incision was made in the left palm, carried down through subcutaneous tissue.  Bleeders were electrocauterized with bipolar. Palmar fascia was split.  Superficial palmar arch identified.  Flexor tendon to the ring and little finger identified to the ulnar side of the median nerve.  Carpal  retinaculum was incised with sharp dissection. Right angle and Sewall retractor placed between skin and forearm fascia. The distal forearm fascia was released for approximately 2 cm proximal to the wrist crease under direct vision.  Canal was explored.  Air compression to the nerve was apparent.  No further lesions were identified.  The wound was copiously irrigated with saline and the skin closed with interrupted 4-0 nylon sutures.  Local infiltration with 0.25% bupivacaine without epinephrine was given, approximately 8 mL was used.  Sterile compressive dressing with the fingers free was applied.  On deflation of the tourniquet, all fingers immediately pinked.  She was taken to the recovery room for observation in satisfactory condition.  She will be discharged home to return to the [facility] in 1 week, on Norco.          ______________________________ Daryll Brod, M.D.     GK/MEDQ  D:  06/05/2014  T:  06/05/2014  Job:  [id number]

## 2014-06-05 NOTE — Op Note (Signed)
Dictation Number [id number]

## 2014-06-05 NOTE — Anesthesia Preprocedure Evaluation (Addendum)
Anesthesia Evaluation  Patient identified by MRN, date of birth, ID band Patient awake    Reviewed: Allergy & Precautions, NPO status   Airway Mallampati: II  TM Distance: >3 FB Neck ROM: Full  Mouth opening: Limited Mouth Opening  Dental  (+) Teeth Intact, Dental Advisory Given   Pulmonary Current Smoker,  breath sounds clear to auscultation        Cardiovascular Rhythm:Regular Rate:Normal     Neuro/Psych  Headaches, Anxiety Depression  Neuromuscular disease    GI/Hepatic   Endo/Other    Renal/GU      Musculoskeletal   Abdominal   Peds  Hematology   Anesthesia Other Findings Myofascial pain syndrome   Reproductive/Obstetrics                            Anesthesia Physical Anesthesia Plan  ASA: II  Anesthesia Plan: General   Post-op Pain Management:    Induction: Intravenous  Airway Management Planned: LMA  Additional Equipment:   Intra-op Plan:   Post-operative Plan: Extubation in OR  Informed Consent: I have reviewed the patients History and Physical, chart, labs and discussed the procedure including the risks, benefits and alternatives for the proposed anesthesia with the patient or authorized representative who has indicated his/her understanding and acceptance.   Dental advisory given  Plan Discussed with: CRNA, Anesthesiologist and Surgeon  Anesthesia Plan Comments:         Anesthesia Quick Evaluation

## 2014-06-06 ENCOUNTER — Encounter (HOSPITAL_BASED_OUTPATIENT_CLINIC_OR_DEPARTMENT_OTHER): Payer: Self-pay | Admitting: Orthopedic Surgery

## 2014-06-09 ENCOUNTER — Telehealth: Payer: Self-pay | Admitting: *Deleted

## 2014-06-09 NOTE — Telephone Encounter (Signed)
Pt left a message on my vm stating she is still having neck pain and she states both sides of her face hurt and shoulder pain. Pt is wanting to increase the dose of her medication. The last visit the cymbalta was increased to 90 mg daily and pt  was put on meloxicam. The pt declined amitriptyline. Pt is also supposed to do PT and f/u with [facility] and psychiatry. Please advise

## 2014-06-09 NOTE — Telephone Encounter (Signed)
No changes in dose, push through the pain, a large amount of this is myofascial, and we had discussed the possibility of some worsening of symptoms.

## 2014-06-10 ENCOUNTER — Telehealth: Payer: Self-pay | Admitting: *Deleted

## 2014-06-10 NOTE — Telephone Encounter (Signed)
Pt.notified

## 2014-06-10 NOTE — Telephone Encounter (Signed)
Pt left vm stating that she's been doing the 10mg  trial of belsomra and it's not working.  She wanted to know if she could do a trial of the 20mg  before she pays the high price for the rx.  Please advise.

## 2014-06-10 NOTE — Telephone Encounter (Signed)
Yes increase to 20mg .

## 2014-06-13 ENCOUNTER — Ambulatory Visit (INDEPENDENT_AMBULATORY_CARE_PROVIDER_SITE_OTHER): Payer: 59 | Admitting: Physical Therapy

## 2014-06-13 ENCOUNTER — Other Ambulatory Visit: Payer: Self-pay | Admitting: *Deleted

## 2014-06-13 MED ORDER — SUVOREXANT 20 MG PO TABS: 20.0000 mg | ORAL_TABLET | Freq: Every day | ORAL | Status: DC

## 2014-06-13 NOTE — Telephone Encounter (Signed)
Done

## 2014-06-18 ENCOUNTER — Ambulatory Visit (HOSPITAL_COMMUNITY): Payer: 59 | Admitting: Physician Assistant

## 2014-06-19 ENCOUNTER — Encounter: Payer: 59 | Admitting: Physical Therapy

## 2014-06-23 ENCOUNTER — Telehealth: Payer: Self-pay

## 2014-06-23 ENCOUNTER — Other Ambulatory Visit: Payer: Self-pay | Admitting: Physician Assistant

## 2014-06-23 DIAGNOSIS — G4700 Insomnia, unspecified: Secondary | ICD-10-CM

## 2014-06-23 NOTE — Telephone Encounter (Signed)
Patient called stating that she would like to increase the dosage on Belsomra from 20 mg to 30 mg. Per provider the highest dose for this medication is 20 mg. Darla Lesches, [employer]

## 2014-06-23 NOTE — Telephone Encounter (Signed)
Made referral to sleep studies.

## 2014-06-23 NOTE — Telephone Encounter (Signed)
Called patient to notify her that her that she is on the highest dose of Belsomra. Pt is currently taking Belsomra 20 mg daily. She states that she is not getting enough sleep which is causing her other illness to flare up. She also states that she is breaking out in hives. I asked is she wanted to try a sleep study. She expressed willingness to try.  Darla Lesches, [place]

## 2014-06-24 ENCOUNTER — Ambulatory Visit (INDEPENDENT_AMBULATORY_CARE_PROVIDER_SITE_OTHER): Payer: 59 | Admitting: Psychiatry

## 2014-06-24 ENCOUNTER — Encounter (HOSPITAL_COMMUNITY): Payer: Self-pay | Admitting: Psychiatry

## 2014-06-24 ENCOUNTER — Telehealth: Payer: Self-pay | Admitting: Physician Assistant

## 2014-06-24 ENCOUNTER — Ambulatory Visit (INDEPENDENT_AMBULATORY_CARE_PROVIDER_SITE_OTHER): Payer: 59 | Admitting: Physical Therapy

## 2014-06-24 VITALS — BP 106/92 | HR 90 | Ht 63.0 in | Wt 133.0 lb

## 2014-06-24 DIAGNOSIS — R293 Abnormal posture: Secondary | ICD-10-CM

## 2014-06-24 DIAGNOSIS — F0630 Mood disorder due to known physiological condition, unspecified: Secondary | ICD-10-CM

## 2014-06-24 DIAGNOSIS — F4310 Post-traumatic stress disorder, unspecified: Secondary | ICD-10-CM

## 2014-06-24 DIAGNOSIS — F411 Generalized anxiety disorder: Secondary | ICD-10-CM

## 2014-06-24 DIAGNOSIS — M6281 Muscle weakness (generalized): Secondary | ICD-10-CM

## 2014-06-24 DIAGNOSIS — M2550 Pain in unspecified joint: Secondary | ICD-10-CM

## 2014-06-24 NOTE — Patient Instructions (Signed)
Patient instructed to:   * Work on desensitizing the L shoulder girdle with assistance of children. Light sweeping motions or simple touch held until just the feeling of pressure instead of pain. Perform this at least 10-15 min/session,  1x/day.  * The patient is advised to apply ice or cold packs intermittently as needed to relieve pain.  * Perform HEP exercises 1x/day - added shoulder presses, thoracic lifts.   * Become more mindful of proper alignment/posture when sitting, standing, driving.  Use lumbar support in car to maintain lumbar curve.

## 2014-06-24 NOTE — Telephone Encounter (Signed)
Left detailed message. Referral coordinator will call when scheduled. Charna Archer, LPN

## 2014-06-24 NOTE — Telephone Encounter (Signed)
Amber, there is a referral in our Work Queue for a sleep study but they are usually put in as an Sales executive.

## 2014-06-24 NOTE — Therapy (Signed)
[facility] [place], [place], [address] Phone: [phone]   Fax:  [phone]  Physical Therapy Treatment  Patient Details  Name: [name] MRN: [id number] Date of Birth: [birth year] Referring Provider:  Silverio Decamp,*  Encounter Date: 06/24/2014      PT End of Session - 06/24/14 1019    Visit Number 2   Number of Visits 16   Date for PT Re-Evaluation 08/08/14   PT Start Time 1017   PT Stop Time 1130   PT Time Calculation (min) 73 min   Behavior During Therapy Anxious      Past Medical History  Diagnosis Date  . Anxiety   . Headache(784.0)   . IBS (irritable bowel syndrome)     questionable, per pt; saw Dr. Collene Mares  . IC (interstitial cystitis)   . Insomnia     Past Surgical History  Procedure Laterality Date  . Cesarean section    . Pelvic laparoscopy  2003    endometriosis  . Ovarian cyst removal    . Dilation and evacuation N/A 05/07/2013    Procedure: DILATATION AND EVACUATION with tissue sent for chromosome analysis;  Surgeon: Lovenia Kim, MD;  Location: [facility];  Service: Gynecology;  Laterality: N/A;  . Cervical spine surgery  8/15  . Carpal tunnel release Left 06/05/2014    Procedure: LEFT CARPAL TUNNEL RELEASE;  Surgeon: Daryll Brod, MD;  Location: [facility];  Service: Orthopedics;  Laterality: Left;    There were no vitals taken for this visit.  Visit Diagnosis:  Multiple joint pain  Generalized muscle weakness  Posture abnormality      Subjective Assessment - 06/24/14 1020    Symptoms Still having pain and discomfort from L shoulder that occasionally goes up to face. Has informed MD.  No relief yet. Still stressed that pain worsens with driving.    Currently in Pain? Yes   Pain Score 6    Pain Location Shoulder   Pain Orientation Left   Pain Descriptors / Indicators Tightness;Burning   Pain Radiating Towards zapping down to elbow.    Aggravating Factors  driving, using vacuum cleaner    Pain Relieving Factors rest, ice,           [facility] PT Assessment - 06/24/14 0001    ROM / Strength   AROM / PROM / Strength AROM   AROM   AROM Assessment Site Shoulder;Cervical   Right/Left Shoulder Left   Left Shoulder Flexion 140 Degrees  with pain    Left Shoulder ABduction 115 Degrees  with pain   Left Shoulder Internal Rotation 65 Degrees   Left Shoulder External Rotation 60 Degrees   Cervical Flexion 22   Cervical Extension 31   Cervical - Right Side Bend 35   Cervical - Left Side Bend 38   Cervical - Right Rotation 65   Cervical - Left Rotation 47                  [facility] Adult PT Treatment/Exercise - 06/24/14 0001    Posture/Postural Control   Posture/Postural Control Postural limitations   Postural Limitations Rounded Shoulders;Forward head;Decreased lumbar lordosis;Posterior pelvic tilt   Posture Comments Multiple VC and tactile cues:increase lumbar lordosis, chest lifted, head in neutral, shoulders neutral   pt sitting on therapy ball and mat. Multiple trials with visual feedback from mirror; improved with repetition.    Exercises   Exercises Neck;Shoulder: extensive time spent educating pt  on importance of therapeutic exercise with good form and posture. At times pt required cues to not hold breath, relax, and decrease contraction amount as to not increase pain.    Neck Exercises: Machines for Strengthening   UBE (Upper Arm Bike) --  Deferred:Unable to grip due to recent wrist surgery.    Neck Exercises: Seated   Neck Retraction 10 reps  Poor form; required multiple tactile cues.   Shoulder Shrugs 20 reps   Other Seated Exercise Gentle bouncing on Red therapy ball to unweight spine, lateral bending of cervical spine (gentle) for stretch 15 sec each side x 4    Neck Exercises: Supine   Neck Retraction 10 reps  then followed by head nods in neutral position x 5 reps of 5   Cervical Rotation  Right;Left;5 reps  Head nods with rotation to 40 degrees   Other Supine Exercise Head/shoulder presses with 5 sec hold x 10   Other Supine Exercise thoracic lifts 5 sec hold x 10   cramped at 5 reps,  LTR stretch to release cramp x 30 sec x 2 reps each direction    Modalities   Modalities Cryotherapy   Cryotherapy   Cryotherapy Location Shoulder;Neck   Type of Cryotherapy Ice pack   Manual Therapy   Manual Therapy Myofascial release   Myofascial Release anterior neck, SCM, platysma. Pt instructed on self MFR to same areas. Shown in sitting and in supine. Pt able to return demo after repetitive cues.    Other Manual Therapy Desentization to L shoulder, gentle TPR to post Lt rotator cuff. x 6 min                 PT Education - 06/24/14 1250    Education provided Yes   Education Details self care, HEP    Person(s) Educated Patient   Methods Explanation;Demonstration   Comprehension Verbalized understanding;Returned demonstration;Tactile cues required          PT Short Term Goals - 06/24/14 1014    PT SHORT TERM GOAL #1   Title be independent with initial HEP    Time 4   Period Weeks   Status On-going   PT SHORT TERM GOAL #2   Title Report pain decrease =/>25% overall    Time 4   Period Weeks   Status On-going   PT SHORT TERM GOAL #3   Title Increase L shoulder ROM to [facility]   Time 4   Period Weeks   Status On-going           PT Long Term Goals - 06/24/14 1015    PT LONG TERM GOAL #1   Title Demonstrate and/or verbalize techniques to reduce risk of re-injury to include information on posture and body mechanics   Time 8   Period Weeks   Status On-going   PT LONG TERM GOAL #2   Title Be independent with advanced HEP    Time 8   Period Weeks   Status On-going   PT LONG TERM GOAL #3   Title Report pain decrease =/>50% to all normal driving   Time 8   Period Weeks   Status On-going   PT LONG TERM GOAL #4   Title Increase strength Lt shoulder =/>4+/5     Time 8   Period Weeks   Status On-going   PT LONG TERM GOAL #5   Title Improve FOTO =/<45% limited    Time 8   Period Weeks   Status On-going  Plan - 06/24/14 1258    Clinical Impression Statement Pt demonstrated similar cervical and Lt shoulder ROM as during eval on 06/13/14; ER improved by 7 degrees. Pt required frequent tactile and VC for proper form of exercises and for postural corrections.  Pt continues to present with hypersensitivity to touch in Lt shoulder girdle. Pt reported decrease in pain from 6/10 to 4/10 after manual and therapeutic exercises.  No goals met yet secondary to number of visits.    PT Frequency 2x / week   PT Duration 8 weeks   PT Treatment/Interventions Cryotherapy;Moist Heat;Therapeutic exercise;Patient/family education;Manual techniques;Ultrasound   PT Next Visit Plan Continue postural re-education, manual therapy, and progressive strengthening to LUE.    Consulted and Agree with Plan of Care Patient        Problem List Patient Active Problem List   Diagnosis Date Noted  . Paresthesia 06/03/2014  . Myofascial pain syndrome 05/29/2014  . Carpal tunnel syndrome of left wrist 05/05/2014  . Left shoulder pain 04/02/2014  . Constipation due to opioid therapy 01/17/2014  . Abnormal weight gain 11/29/2013  . Greater trochanteric bursitis 11/29/2013  . Itching 11/29/2013  . Neck pain 10/31/2013  . PTSD (post-traumatic stress disorder) 12/14/2012  . POLYURIA 11/04/2010  . Tick-borne disease 08/09/2010  . TROCHANTERIC BURSITIS 06/21/2010  . CIGARETTE SMOKER 02/05/2010  . INTERSTITIAL CYSTITIS 02/05/2010  . RHINITIS 10/13/2009  . FATIGUE 07/02/2009  . DEPRESSION 06/26/2008  . ANXIETY 07/06/2007  . INSOMNIA 07/06/2007  . HEADACHE 07/06/2007    Kerin Perna, PTA 06/24/2014 1:16 PM  [facility] [place] [facility] [place], [place], [address] Phone: [phone]   Fax:   [phone]
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
## 2014-06-25 ENCOUNTER — Telehealth: Payer: Self-pay | Admitting: *Deleted

## 2014-06-25 NOTE — Telephone Encounter (Signed)
Referral is for a sleep physician not for an actual sleep study.

## 2014-06-26 ENCOUNTER — Ambulatory Visit: Payer: 59 | Admitting: Sports Medicine

## 2014-06-27 ENCOUNTER — Ambulatory Visit (INDEPENDENT_AMBULATORY_CARE_PROVIDER_SITE_OTHER): Payer: 59 | Admitting: Physical Therapy

## 2014-06-27 ENCOUNTER — Other Ambulatory Visit: Payer: Self-pay | Admitting: Physician Assistant

## 2014-06-27 DIAGNOSIS — R293 Abnormal posture: Secondary | ICD-10-CM

## 2014-06-27 DIAGNOSIS — M6281 Muscle weakness (generalized): Secondary | ICD-10-CM

## 2014-06-27 DIAGNOSIS — M2550 Pain in unspecified joint: Secondary | ICD-10-CM

## 2014-06-27 NOTE — Patient Instructions (Signed)
Pt issued paper copy of shoulder unattached strengthening exercises.   Pt instructed to do scap retraction (without raising shoulders), bilateral horiz abd with yellow band (issued), and bilateral shoulder ER - all in supine with neutral head posture.   10 reps, 2 sets. 1x/day or every other day.

## 2014-06-27 NOTE — Therapy (Signed)
[place], [place], [address] Phone: [phone]   Fax:  [phone]  Physical Therapy Treatment  Patient Details  Name: [name] MRN: [id number] Date of Birth: Apr 23, 1976 Referring Provider:  Silverio Decamp,*  Encounter Date: 06/27/2014      PT End of Session - 06/27/14 1100    Visit Number 3   Number of Visits 16   Date for PT Re-Evaluation 08/08/14   PT Start Time 1572   PT Stop Time 1154   PT Time Calculation (min) 56 min   Activity Tolerance Patient tolerated treatment well      Past Medical History  Diagnosis Date  . Anxiety   . Headache(784.0)   . IBS (irritable bowel syndrome)     questionable, per pt; saw Dr. Collene Mares  . IC (interstitial cystitis)   . Insomnia     Past Surgical History  Procedure Laterality Date  . Cesarean section    . Pelvic laparoscopy  2003    endometriosis  . Ovarian cyst removal    . Dilation and evacuation N/A 05/07/2013    Procedure: DILATATION AND EVACUATION with tissue sent for chromosome analysis;  Surgeon: Lovenia Kim, MD;  Location: [facility];  Service: Gynecology;  Laterality: N/A;  . Cervical spine surgery  8/15  . Carpal tunnel release Left 06/05/2014    Procedure: LEFT CARPAL TUNNEL RELEASE;  Surgeon: Daryll Brod, MD;  Location: [facility];  Service: Orthopedics;  Laterality: Left;    There were no vitals taken for this visit.  Visit Diagnosis:  Multiple joint pain  Generalized muscle weakness  Posture abnormality      Subjective Assessment - 06/27/14 1104    Symptoms Having trouble sleeping still. After therapy, later in day my whole L neck hurt. Sore since last visit.    Currently in Pain? Yes   Pain Score 7    Pain Location Shoulder   Pain Orientation Left   Pain Descriptors / Indicators Burning;Sore;Tightness   Pain Radiating Towards zapping across neck    Aggravating Factors  driving,  mopping, vacuum cleaner use    Pain Relieving Factors rest, ice                     [facility] Adult PT Treatment/Exercise - 06/27/14 0001    Exercises   Exercises Neck;Shoulder   Neck Exercises: Seated   Neck Retraction 10 reps   Shoulder Rolls 15 reps   Shoulder Exercises: Supine   Horizontal ABduction --  On half foam roll, 2 sets   Theraband Level (Shoulder Horizontal ABduction) Level 1 (Yellow)  x 10 reps    External Rotation Strengthening;Both;10 reps;Theraband  on half foam roll, 2 sets    Theraband Level (Shoulder External Rotation) Level 1 (Yellow)   Other Supine Exercises Hooklying on Half foam roll - snow angels x 10, chin tucks x 10   Shoulder Exercises: Standing   Other Standing Exercises Wall ladder flexion with L hand (up to #23 x 5 reps. VC to keep shoulder blade down.    Other Standing Exercises scap retraction in front of mirror for feedback, VC not to shorten neck x 10    Shoulder Exercises: Pulleys   Flexion 2 minutes   ABduction 2 minutes   ABduction Limitations wash cloth for L hand grip   Shoulder Exercises: ROM/Strengthening   UBE (Upper Arm Bike) Level 1- 1.5 min each direction, washcloth for L hand grip  Cryotherapy   Number Minutes Cryotherapy 15 Minutes   Cryotherapy Location Shoulder;Neck   Type of Cryotherapy Ice pack   Manual Therapy   Manual Therapy Other (comment)   Myofascial Release TPR to L subscap with contract relax in ER, TRP to L levator                 PT Education - 06/27/14 1605    Education provided Yes   Education Details self care, HEP    Person(s) Educated Patient   Methods Demonstration;Explanation;Handout   Comprehension Verbalized understanding;Returned demonstration          PT Short Term Goals - 06/24/14 1014    PT SHORT TERM GOAL #1   Title be independent with initial HEP    Time 4   Period Weeks   Status On-going   PT SHORT TERM GOAL #2   Title Report pain decrease =/>25% overall    Time 4    Period Weeks   Status On-going   PT SHORT TERM GOAL #3   Title Increase L shoulder ROM to [facility]   Time 4   Period Weeks   Status On-going           PT Long Term Goals - 06/24/14 1015    PT LONG TERM GOAL #1   Title Demonstrate and/or verbalize techniques to reduce risk of re-injury to include information on posture and body mechanics   Time 8   Period Weeks   Status On-going   PT LONG TERM GOAL #2   Title Be independent with advanced HEP    Time 8   Period Weeks   Status On-going   PT LONG TERM GOAL #3   Title Report pain decrease =/>50% to all normal driving   Time 8   Period Weeks   Status On-going   PT LONG TERM GOAL #4   Title Increase strength Lt shoulder =/>4+/5    Time 8   Period Weeks   Status On-going   PT LONG TERM GOAL #5   Title Improve FOTO =/<45% limited    Time 8   Period Weeks   Status On-going               Plan - 06/27/14 1605    Clinical Impression Statement Pt presented with improved posture this session and was able to demonstrate improved neutral head posture with minimal cues. Pt reported decrease in pain to 3/10 after treatment.  Pt reported decreased sensitivity in L shoulder after use of half foam roll for therapeutic exercise. Progressing towards goals yet no goals met this session.    PT Frequency 2x / week   PT Duration 8 weeks   PT Treatment/Interventions Cryotherapy;Moist Heat;Therapeutic exercise;Patient/family education;Manual techniques;Ultrasound   PT Next Visit Plan Continue postural re-education, manual therapy, and progressive strengthening/ROM to LUE.    Consulted and Agree with Plan of Care Patient        Problem List Patient Active Problem List   Diagnosis Date Noted  . Paresthesia 06/03/2014  . Myofascial pain syndrome 05/29/2014  . Carpal tunnel syndrome of left wrist 05/05/2014  . Left shoulder pain 04/02/2014  . Constipation due to opioid therapy 01/17/2014  . Abnormal weight gain 11/29/2013  . Greater  trochanteric bursitis 11/29/2013  . Itching 11/29/2013  . Neck pain 10/31/2013  . PTSD (post-traumatic stress disorder) 12/14/2012  . POLYURIA 11/04/2010  . Tick-borne disease 08/09/2010  . TROCHANTERIC BURSITIS 06/21/2010  . CIGARETTE SMOKER 02/05/2010  . INTERSTITIAL CYSTITIS  02/05/2010  . RHINITIS 10/13/2009  . FATIGUE 07/02/2009  . DEPRESSION 06/26/2008  . ANXIETY 07/06/2007  . INSOMNIA 07/06/2007  . HEADACHE 07/06/2007   Kerin Perna, PTA 06/27/2014 [facility] [place], [place], [address] Phone: [phone]   Fax:  [phone]

## 2014-06-30 ENCOUNTER — Ambulatory Visit (INDEPENDENT_AMBULATORY_CARE_PROVIDER_SITE_OTHER): Payer: 59 | Admitting: Sports Medicine

## 2014-06-30 ENCOUNTER — Encounter: Payer: 59 | Admitting: Physical Therapy

## 2014-06-30 ENCOUNTER — Encounter: Payer: Self-pay | Admitting: Sports Medicine

## 2014-06-30 DIAGNOSIS — M5030 Other cervical disc degeneration, unspecified cervical region: Secondary | ICD-10-CM

## 2014-06-30 NOTE — Assessment & Plan Note (Addendum)
Status post cervical ACDF with residual left-sided foraminal stenosis. I do think the majority of her pain is myofascial, and related to uncontrolled depression and anxiety however considering residual stenosis we are going to try a left-sided C6-C7 interlaminar epidural.  Return to see me 3 weeks after to go over response.  I do think she is somewhat manic now 120 mg of Cymbalta considering pressured speech, difficulty sleeping, flight of ideas. I'm going to decrease her dose back to 60 mg. Yes

## 2014-06-30 NOTE — Progress Notes (Signed)
  Subjective:    CC: follow-up  HPI: [name] returns, she continues to have pain in her neck and down the left arm, initially we suspected predominantly a myofascial type syndrome so we increased her Cymbalta to 90 mg. She was seen by psychiatry who increased it to 120 mg, patient is still not noticed any improvement. She is going through physical therapy, and also continues to note persistent pain. She had a nerve conduction study that showed carpal tunnel syndrome but no evidence of radiculopathy and has since had a carpal tunnel release. She tells me that she still has not noted any improvement after her carpal tunnel release. Symptoms are moderate, persistent.on further questioning she does agree that her anxiety has worsened, she has persistent pressured speech, flight of ideas, and difficulty sleeping. No suicidal or homicidal ideation.  She is amenable to try an epidural.  Past medical history, Surgical history, Family history not pertinant except as noted below, Social history, Allergies, and medications have been entered into the medical record, reviewed, and no changes needed.   Review of Systems: No fevers, chills, night sweats, weight loss, chest pain, or shortness of breath.   Objective:    General: Well Developed, well nourished, and in no acute distress. Patient speaks relatively fast, speech seems pressured. Neuro: Alert and oriented x3, extra-ocular muscles intact, sensation grossly intact.  HEENT: Normocephalic, atraumatic, pupils equal round reactive to light, neck supple, no masses, no lymphadenopathy, thyroid nonpalpable.  Skin: Warm and dry, no rashes. Cardiac: Regular rate and rhythm, no murmurs rubs or gallops, no lower extremity edema.  Respiratory: Clear to auscultation bilaterally. Not using accessory muscles, speaking in full sentences.  Impression and Recommendations:

## 2014-07-02 ENCOUNTER — Ambulatory Visit (INDEPENDENT_AMBULATORY_CARE_PROVIDER_SITE_OTHER): Payer: 59 | Admitting: Physical Therapy

## 2014-07-02 DIAGNOSIS — M2550 Pain in unspecified joint: Secondary | ICD-10-CM

## 2014-07-02 DIAGNOSIS — M6281 Muscle weakness (generalized): Secondary | ICD-10-CM

## 2014-07-02 DIAGNOSIS — R293 Abnormal posture: Secondary | ICD-10-CM

## 2014-07-02 NOTE — Patient Instructions (Signed)
Pt is to add shoulder diagonals to HEP: (D1 Flexion/ Ext) with yellow band x 1 set of 10

## 2014-07-02 NOTE — Therapy (Signed)
[place], [place], [address] Phone: [phone]   Fax:  [phone]  Physical Therapy Treatment  Patient Details  Name: [name] MRN: [id number] Date of Birth: 13-Oct-1975 Referring Provider:  Silverio Decamp,*  Encounter Date: 07/02/2014      PT End of Session - 07/02/14 0712    Visit Number 4   Number of Visits 16   Date for PT Re-Evaluation 08/08/14   PT Start Time 0708   PT Stop Time 0803   PT Time Calculation (min) 55 min   Activity Tolerance Patient limited by pain      Past Medical History  Diagnosis Date  . Anxiety   . Headache(784.0)   . IBS (irritable bowel syndrome)     questionable, per pt; saw Dr. Collene Mares  . IC (interstitial cystitis)   . Insomnia     Past Surgical History  Procedure Laterality Date  . Cesarean section    . Pelvic laparoscopy  2003    endometriosis  . Ovarian cyst removal    . Dilation and evacuation N/A 05/07/2013    Procedure: DILATATION AND EVACUATION with tissue sent for chromosome analysis;  Surgeon: Lovenia Kim, MD;  Location: [facility];  Service: Gynecology;  Laterality: N/A;  . Cervical spine surgery  8/15  . Carpal tunnel release Left 06/05/2014    Procedure: LEFT CARPAL TUNNEL RELEASE;  Surgeon: Daryll Brod, MD;  Location: [facility];  Service: Orthopedics;  Laterality: Left;    There were no vitals taken for this visit.  Visit Diagnosis:  Multiple joint pain  Generalized muscle weakness  Posture abnormality      Subjective Assessment - 07/02/14 0709    Symptoms Didn't sleep last night.  So sore in both front and back of neck.    Pain Score 6    Pain Location Neck   Pain Orientation Left   Pain Descriptors / Indicators Burning  zapping; sensitive    Aggravating Factors  driving    Pain Relieving Factors rest, ice, TENS unit           [facility] PT Assessment - 07/02/14 0001    Assessment   Medical  Diagnosis M79.1   AROM   AROM Assessment Site Shoulder;Cervical   Right/Left Shoulder Left   Left Shoulder Flexion 152 Degrees  standing   Left Shoulder ABduction 120 Degrees  standing   Left Shoulder Internal Rotation 70 Degrees   Left Shoulder External Rotation 75 Degrees   Cervical Flexion 31   Cervical Extension 26   Cervical - Right Rotation 58   Cervical - Left Rotation 46                  [facility] Adult PT Treatment/Exercise - 07/02/14 0001    Exercises   Exercises Neck;Shoulder   Shoulder Exercises: Supine   Horizontal ABduction --  On half foam roll, 2 sets   Theraband Level (Shoulder Horizontal ABduction) Level 1 (Yellow)   External Rotation Strengthening;Both;10 reps;Theraband  on half foam roll, 2 sets    Theraband Level (Shoulder External Rotation) Level 1 (Yellow)   External Rotation Limitations some reported pain in L upper trap with this motion    Other Supine Exercises Hooklying on Half foam roll - snow angels x 10, head nods (5) x 5,    Other Supine Exercises Hooklying on 1/2 foam roll, shoulder diagonalsx 15, yellow tband. required tactile cues for form.    Shoulder  Exercises: Pulleys   Flexion 2 minutes   ABduction 2 minutes   ABduction Limitations --  Grip improved. No wash cloth needed.    Cryotherapy   Number Minutes Cryotherapy 15 Minutes   Cryotherapy Location Shoulder;Neck   Type of Cryotherapy Ice pack   Manual Therapy   Manual Therapy Myofascial release;Other (comment)   Myofascial Release attempted to Lt scalenes and UT; pt unable to tolerate.    Other Manual Therapy Kinesiotape application anchored at L rotator cuff attachment and fanning up into cerv spine to aid in decreased edema and desensitization.                 PT Education - 07/02/14 0755    Education provided Yes   Education Details HEP    Person(s) Educated Patient   Methods Explanation;Demonstration   Comprehension Verbalized understanding;Returned  demonstration          PT Short Term Goals - 06/24/14 1014    PT SHORT TERM GOAL #1   Title be independent with initial HEP    Time 4   Period Weeks   Status On-going   PT SHORT TERM GOAL #2   Title Report pain decrease =/>25% overall    Time 4   Period Weeks   Status On-going   PT SHORT TERM GOAL #3   Title Increase L shoulder ROM to [facility]   Time 4   Period Weeks   Status On-going           PT Long Term Goals - 06/24/14 1015    PT LONG TERM GOAL #1   Title Demonstrate and/or verbalize techniques to reduce risk of re-injury to include information on posture and body mechanics   Time 8   Period Weeks   Status On-going   PT LONG TERM GOAL #2   Title Be independent with advanced HEP    Time 8   Period Weeks   Status On-going   PT LONG TERM GOAL #3   Title Report pain decrease =/>50% to all normal driving   Time 8   Period Weeks   Status On-going   PT LONG TERM GOAL #4   Title Increase strength Lt shoulder =/>4+/5    Time 8   Period Weeks   Status On-going   PT LONG TERM GOAL #5   Title Improve FOTO =/<45% limited    Time 8   Period Weeks   Status On-going               Plan - 07/02/14 0755    Clinical Impression Statement Pt demonstrated improved Lt shoulder ROM (as well as Lt hand grip). Pt presented with hypersensitive L shoulder and neck to touch; reduced significantly during use of 1/2 foam roll. Pt was unable to tolerate MFR to shoulder, but tolerated new shoulder exercise.  No goals met yet.    PT Frequency 2x / week   PT Duration 8 weeks   PT Treatment/Interventions Cryotherapy;Moist Heat;Therapeutic exercise;Patient/family education;Manual techniques;Ultrasound   PT Next Visit Plan Continue postural re-education, manual therapy, and progressive strengthening/ROM to LUE.    Consulted and Agree with Plan of Care Patient        Problem List Patient Active Problem List   Diagnosis Date Noted  . Paresthesia 06/03/2014  . Myofascial pain  syndrome 05/29/2014  . Carpal tunnel syndrome of left wrist 05/05/2014  . Left shoulder pain 04/02/2014  . Constipation due to opioid therapy 01/17/2014  . Abnormal weight gain 11/29/2013  .  Greater trochanteric bursitis 11/29/2013  . Itching 11/29/2013  . Degenerative disc disease, cervical 10/31/2013  . PTSD (post-traumatic stress disorder) 12/14/2012  . POLYURIA 11/04/2010  . Tick-borne disease 08/09/2010  . TROCHANTERIC BURSITIS 06/21/2010  . CIGARETTE SMOKER 02/05/2010  . INTERSTITIAL CYSTITIS 02/05/2010  . RHINITIS 10/13/2009  . FATIGUE 07/02/2009  . DEPRESSION 06/26/2008  . ANXIETY 07/06/2007  . INSOMNIA 07/06/2007  . HEADACHE 07/06/2007    Kerin Perna, PTA 07/02/2014 10:11 AM  [facility] [place], [place], [address] Phone: [phone]   Fax:  [phone]

## 2014-07-07 ENCOUNTER — Encounter: Payer: 59 | Admitting: Physical Therapy

## 2014-07-09 ENCOUNTER — Other Ambulatory Visit: Payer: Self-pay | Admitting: Obstetrics and Gynecology

## 2014-07-09 ENCOUNTER — Ambulatory Visit
Admission: RE | Admit: 2014-07-09 | Discharge: 2014-07-09 | Disposition: A | Discharge status: Home / Self Care | Payer: 59 | Source: Ambulatory Visit | Attending: Sports Medicine | Admitting: Sports Medicine

## 2014-07-09 ENCOUNTER — Encounter: Payer: 59 | Admitting: Physical Therapy

## 2014-07-09 DIAGNOSIS — M5030 Other cervical disc degeneration, unspecified cervical region: Secondary | ICD-10-CM

## 2014-07-09 MED ORDER — TRIAMCINOLONE ACETONIDE 40 MG/ML IJ SUSP (RADIOLOGY)
60.0000 mg | Freq: Once | INTRAMUSCULAR | Status: AC
  Administered 2014-07-09: 60 mg via EPIDURAL

## 2014-07-09 MED ORDER — IOHEXOL 300 MG/ML  SOLN
1.0000 mL | Freq: Once | INTRAMUSCULAR | Status: AC | PRN
  Administered 2014-07-09: 1 mL via EPIDURAL

## 2014-07-09 NOTE — Discharge Instructions (Signed)

## 2014-07-10 ENCOUNTER — Encounter (HOSPITAL_COMMUNITY)
Admission: RE | Admit: 2014-07-10 | Discharge: 2014-07-10 | Disposition: A | Discharge status: Home / Self Care | Payer: 59 | Source: Ambulatory Visit | Attending: Obstetrics and Gynecology | Admitting: Obstetrics and Gynecology

## 2014-07-10 ENCOUNTER — Encounter (HOSPITAL_COMMUNITY): Payer: Self-pay

## 2014-07-10 DIAGNOSIS — Z01812 Encounter for preprocedural laboratory examination: Secondary | ICD-10-CM | POA: Insufficient documentation

## 2014-07-10 DIAGNOSIS — N809 Endometriosis, unspecified: Secondary | ICD-10-CM | POA: Diagnosis not present

## 2014-07-10 HISTORY — DX: Unspecified asthma, uncomplicated: J45.909

## 2014-07-10 LAB — CBC
HCT: 42.7 % (ref 36.0–46.0)
Hemoglobin: 14.4 g/dL (ref 12.0–15.0)
MCH: 32.6 pg (ref 26.0–34.0)
MCHC: 33.7 g/dL (ref 30.0–36.0)
MCV: 96.6 fL (ref 78.0–100.0)
PLATELETS: 303 10*3/uL (ref 150–400)
RBC: 4.42 MIL/uL (ref 3.87–5.11)
RDW: 12.7 % (ref 11.5–15.5)
WBC: 13.1 10*3/uL — AB (ref 4.0–10.5)

## 2014-07-10 LAB — BASIC METABOLIC PANEL
ANION GAP: 8 (ref 5–15)
BUN: 12 mg/dL (ref 6–23)
CO2: 25 mmol/L (ref 19–32)
Calcium: 9.6 mg/dL (ref 8.4–10.5)
Chloride: 105 mmol/L (ref 96–112)
Creatinine, Ser: 0.83 mg/dL (ref 0.50–1.10)
GFR calc Af Amer: >90 mL/min (ref >90)
GFR calc non Af Amer: 87 mL/min — ABNORMAL LOW (ref >90)
GLUCOSE: 94 mg/dL (ref 70–99)
Potassium: 4 mmol/L (ref 3.5–5.1)
Sodium: 138 mmol/L (ref 135–145)

## 2014-07-10 NOTE — Addendum Note (Signed)
Addended by: Brien Few on: 07/10/2014 08:32 AM   Modules accepted: Orders

## 2014-07-10 NOTE — Patient Instructions (Signed)
Your procedure is scheduled on:07/18/14 Enter through the Main Entrance at :0830 am Pick up desk phone and dial [phone] and inform us of your arrival.  Please call [phone] if you have any problems the morning of surgery.  Remember: Do not eat food after midnight:07/17/14  You may brush your teeth the morning of surgery.  Take these meds the morning of surgery with a sip of water:Cymbalta, Lyrica, Mobic DO NOT wear jewelry, eye make-up, lipstick,body lotion, or dark fingernail polish.  (Polished toes are ok) You may wear deodorant.  If you are to be admitted after surgery, leave suitcase in car until your room has been assigned. Patients discharged on the day of surgery will not be allowed to drive home. Wear loose fitting, comfortable clothes for your ride home.

## 2014-07-11 ENCOUNTER — Other Ambulatory Visit: Payer: Self-pay | Admitting: Physician Assistant

## 2014-07-13 MED ORDER — SUVOREXANT 20 MG PO TABS: 20.0000 mg | ORAL_TABLET | Freq: Every day | ORAL | Status: DC

## 2014-07-14 ENCOUNTER — Ambulatory Visit (INDEPENDENT_AMBULATORY_CARE_PROVIDER_SITE_OTHER): Payer: 59 | Admitting: Physical Therapy

## 2014-07-14 DIAGNOSIS — M2550 Pain in unspecified joint: Secondary | ICD-10-CM

## 2014-07-14 DIAGNOSIS — M6281 Muscle weakness (generalized): Secondary | ICD-10-CM

## 2014-07-14 DIAGNOSIS — R293 Abnormal posture: Secondary | ICD-10-CM

## 2014-07-14 NOTE — Therapy (Addendum)
[place], [place], [address] Phone: [phone]   Fax:  [phone]  Physical Therapy Treatment  Patient Details  Name: [name] MRN: [id number] Date of Birth: 07-03-1975 Referring Provider:  Silverio Decamp,*  Encounter Date: 07/14/2014      PT End of Session - 07/14/14 0707    Visit Number 5   Number of Visits 16   Date for PT Re-Evaluation 08/08/14   PT Start Time 0701   PT Stop Time 4944   PT Time Calculation (min) 53 min   Activity Tolerance Patient tolerated treatment well      Past Medical History  Diagnosis Date  . Anxiety   . Headache(784.0)   . IBS (irritable bowel syndrome)     questionable, per pt; saw Dr. Collene Mares  . IC (interstitial cystitis)   . Insomnia   . Asthma     Past Surgical History  Procedure Laterality Date  . Cesarean section    . Pelvic laparoscopy  2003    endometriosis  . Ovarian cyst removal    . Dilation and evacuation N/A 05/07/2013    Procedure: DILATATION AND EVACUATION with tissue sent for chromosome analysis;  Surgeon: Lovenia Kim, MD;  Location: [facility];  Service: Gynecology;  Laterality: N/A;  . Cervical spine surgery  8/15  . Carpal tunnel release Left 06/05/2014    Procedure: LEFT CARPAL TUNNEL RELEASE;  Surgeon: Daryll Brod, MD;  Location: [facility];  Service: Orthopedics;  Laterality: Left;    There were no vitals filed for this visit.  Visit Diagnosis:  Multiple joint pain  Generalized muscle weakness  Posture abnormality      Subjective Assessment - 07/14/14 0723    Symptoms Pt reports receiving an injection in neck last week. Pt reports she has no pain in neck and shoulder since injection. Pt reports difficulty lifting with Lt shoulder/hand.    Currently in Pain? No/denies            [facility] PT Assessment - 07/14/14 0001    Assessment   Medical Diagnosis M79.1   ROM / Strength   AROM /  PROM / Strength AROM;Strength   AROM   AROM Assessment Site Shoulder   Left Shoulder Flexion 152 Degrees   Left Shoulder ABduction 145 Degrees   Left Shoulder Internal Rotation 70 Degrees   Left Shoulder External Rotation 85 Degrees   Cervical Flexion 30   Cervical Extension 27   Cervical - Right Side Bend 42   Cervical - Left Side Bend 40   Cervical - Right Rotation 60   Cervical - Left Rotation 60   Strength   Strength Assessment Site Shoulder   Right/Left Shoulder Left   Left Shoulder Flexion 4-/5   Left Shoulder Extension 4+/5   Left Shoulder ABduction 4+/5   Left Shoulder Internal Rotation 4/5   Left Shoulder External Rotation 4-/5                   [facility] Adult PT Treatment/Exercise - 07/14/14 0001    Exercises   Exercises Neck;Shoulder   Shoulder Exercises: Supine   Horizontal ABduction Strengthening;5 reps;10 reps;Theraband  On full foam roll, 2 sets   Theraband Level (Shoulder Horizontal ABduction) Level 2 (Red)  washcloth for Lt hand    External Rotation Strengthening;Both;10 reps;Theraband  On full foam roll, 2 sets    Theraband Level (Shoulder External Rotation) Level 2 (Red)  washcloth for Lt  hand grip    Other Supine Exercises Hooklying on Full foam roll - snow angels x 10, head nods (10) x 5,    Other Supine Exercises Hooklying on full foam roll, shoulder diagonalsx 10 red tband. required tactile cues for form.    Shoulder Exercises: Standing   Internal Rotation Strengthening;Left;20 reps;Theraband   Theraband Level (Shoulder Internal Rotation) Level 2 (Red)   Flexion Strengthening;Left;20 reps;Theraband   Theraband Level (Shoulder Flexion) Level 1 (Yellow)   Shoulder Exercises: Pulleys   Flexion 2 minutes   ABduction 2 minutes   Manual Therapy   Other Manual Therapy Kinesiotape application anchored at L rotator cuff attachment and fanning up into cerv spine to aid in decreased edema and desensitization.   Pt instructed on application of Ktape  for cont. use $RemoveB'@home'YEwjnBIH$ .                PT Education - 07/14/14 0809    Education provided Yes   Education Details HEP- added shoulder IR, Ext, Flex    Person(s) Educated Patient   Methods Explanation;Demonstration;Handout   Comprehension Verbalized understanding;Returned demonstration          PT Short Term Goals - 06/24/14 1014    PT SHORT TERM GOAL #1   Title be independent with initial HEP    Time 4   Period Weeks   Status On-going   PT SHORT TERM GOAL #2   Title Report pain decrease =/>25% overall    Time 4   Period Weeks   Status On-going   PT SHORT TERM GOAL #3   Title Increase L shoulder ROM to [facility]   Time 4   Period Weeks   Status On-going           PT Long Term Goals - 07/14/14 0801    PT LONG TERM GOAL #1   Title Demonstrate and/or verbalize techniques to reduce risk of re-injury to include information on posture and body mechanics   Time 8   Period Weeks   Status On-going   PT LONG TERM GOAL #2   Title Be independent with advanced HEP    Time 8   Period Weeks   Status On-going   PT LONG TERM GOAL #3   Title Report pain decrease =/>50% to all normal driving   Time 8   Period Weeks   Status Achieved   PT LONG TERM GOAL #4   Title Increase strength Lt shoulder =/>4+/5    Time 8   Period Weeks   Status On-going   PT LONG TERM GOAL #5   Title Improve FOTO =/<45% limited    Time 8   Status On-going               Plan - 07/14/14 0726    Clinical Impression Statement Pt has demonstrated improved Lt shoulder ROM.  Pt was able to tolerate increased resistance with shoulder exercise. Pt now has decreased overall pain since receiving injection. Pt still has decreased strength in Lt shoulder compared to Rt.  Has met LTG #3.    PT Frequency 2x / week   PT Duration 8 weeks   PT Treatment/Interventions Cryotherapy;Moist Heat;Therapeutic exercise;Patient/family education;Manual techniques;Ultrasound   PT Next Visit Plan Continue postural  re-education, manual therapy, and progressive strengthening/ROM to LUE.    Consulted and Agree with Plan of Care Patient        Problem List Patient Active Problem List   Diagnosis Date Noted  . Paresthesia 06/03/2014  . Myofascial  pain syndrome 05/29/2014  . Carpal tunnel syndrome of left wrist 05/05/2014  . Left shoulder pain 04/02/2014  . Constipation due to opioid therapy 01/17/2014  . Abnormal weight gain 11/29/2013  . Greater trochanteric bursitis 11/29/2013  . Itching 11/29/2013  . Degenerative disc disease, cervical 10/31/2013  . PTSD (post-traumatic stress disorder) 12/14/2012  . POLYURIA 11/04/2010  . Tick-borne disease 08/09/2010  . TROCHANTERIC BURSITIS 06/21/2010  . CIGARETTE SMOKER 02/05/2010  . INTERSTITIAL CYSTITIS 02/05/2010  . RHINITIS 10/13/2009  . FATIGUE 07/02/2009  . DEPRESSION 06/26/2008  . ANXIETY 07/06/2007  . INSOMNIA 07/06/2007  . HEADACHE 07/06/2007    Kerin Perna, PTA 07/14/2014 8:16 AM  [place], [place], [address] Phone: [phone]   Fax:  [phone]     PHYSICAL THERAPY DISCHARGE SUMMARY  Visits from Start of Care: 5  Current functional level related to goals / functional outcomes: Unable to assess as patient did not return for f/u visits.   Remaining deficits: Unknown   Education / Equipment: HEP  Plan: Patient agrees to discharge.  Patient goals were partially met. Patient is being discharged due to the patient's request.  Patient requested DC from PT as injection resolved her symptoms.????        Madelyn Flavors, PT 08/18/2014 9:25 AM  [facility] [place], [place] [address]  [phone] (office) [phone] (fax)

## 2014-07-14 NOTE — Patient Instructions (Signed)
Strengthening: Resisted Internal Rotation   Hold tubing in left hand, elbow at side and forearm out. Rotate forearm in across body.Towel anchored at elbow.  Repeat __10__ times per set. Do _2___ sets per session. Do __1__ sessions every other day.  http://orth.exer.us/830    Copyright  VHI. All rights reserved.  Strengthening: Resisted Flexion   Hold tubing with left arm at side. Pull forward and up. Move shoulder through pain-free range of motion. Repeat _10___ times per set. Do __2__ sets per session. Do __1__ sessions every other day.  http://orth.exer.us/824   Copyright  VHI. All rights reserved.  Strengthening: Resisted Extension   Hold tubing in right hand, arm forward. Pull arm back, elbow straight. Repeat __10__ times per set. Do _2___ sets per session. Do __1__ sessions every other day.  http://orth.exer.us/832   Copyright  VHI. All rights reserved.    [facility] [place], [place] [address]  [phone] (office) [phone] (fax)

## 2014-07-17 NOTE — H&P (Signed)
NAMEMIKIA, DELALUZ      ACCOUNT NO.:  [id number]  MEDICAL RECORD NO.:  [id number]  LOCATION:  PERIO                         FACILITY:  [facility]  PHYSICIAN:  Lovenia Kim, M.D.DATE OF BIRTH:  [birth year]  DATE OF ADMISSION:  07/08/2014 DATE OF DISCHARGE:                             HISTORY & PHYSICAL   CHIEF COMPLAINT:  Recurrent dysmenorrhea and pelvic pain with a history of pelvic endometriosis.  HISTORY OF PRESENT ILLNESS:  She is a 39 year old white female G3, P2 who presents now with aforementioned symptoms worsening with decision to proceed with surgical intervention.  ALLERGIES:  She has no known drug allergies.  MEDICATIONS:  Lyrica, Cymbalta, Toradol as needed, meloxicam as needed, Belsomra, Xanax, and prenatal vitamins.  SURGERIES:  She has history of cervical fusion, T and A, cesarean delivery x2, and laparoscopy with ablation of endometriosis in 2004.  PHYSICAL EXAMINATION:  GENERAL:  She is a well-developed, well- nourished, white female, in no acute distress. HEENT:  Normal. NECK:  Supple.  Full range of motion. LUNGS:  Clear. HEART:  Regular rate and rhythm. ABDOMEN:  Soft, nontender. PELVIC:  Revealed an anteflexed uterus and no adnexal masses. EXTREMITIES:  There are no cords. NEUROLOGIC:  Nonfocal. SKIN:  Intact.  IMPRESSION:  Recurrent dysmenorrhea and dyspareunia for laparoscopic intervention.  PLAN:  Proceed with possible da Vinci assisted excision of pelvic endometriosis.  Risks of anesthesia, infection, bleeding, injury to surrounding organs, possible need for repair discussed.  Delayed versus immediate complications to include bowel and bladder injury noted, and inability to cure pelvic pain previously discussed. The patient acknowledges and wishes to proceed.     Lovenia Kim, M.D.     RJT/MEDQ  D:  07/17/2014  T:  07/17/2014  Job:  [id number]

## 2014-07-18 ENCOUNTER — Ambulatory Visit (HOSPITAL_COMMUNITY)
Admission: RE | Admit: 2014-07-18 | Discharge: 2014-07-18 | Disposition: A | Discharge status: Home / Self Care | Payer: 59 | Source: Ambulatory Visit | Attending: Obstetrics and Gynecology | Admitting: Obstetrics and Gynecology

## 2014-07-18 ENCOUNTER — Ambulatory Visit (HOSPITAL_COMMUNITY): Payer: 59 | Admitting: Anesthesiology

## 2014-07-18 ENCOUNTER — Encounter (HOSPITAL_COMMUNITY): Payer: Self-pay | Admitting: *Deleted

## 2014-07-18 ENCOUNTER — Encounter (HOSPITAL_COMMUNITY)
Admission: RE | Disposition: A | Discharge status: Home / Self Care | Payer: Self-pay | Source: Ambulatory Visit | Attending: Obstetrics and Gynecology

## 2014-07-18 DIAGNOSIS — N946 Dysmenorrhea, unspecified: Secondary | ICD-10-CM | POA: Diagnosis not present

## 2014-07-18 DIAGNOSIS — N838 Other noninflammatory disorders of ovary, fallopian tube and broad ligament: Secondary | ICD-10-CM | POA: Diagnosis not present

## 2014-07-18 DIAGNOSIS — F419 Anxiety disorder, unspecified: Secondary | ICD-10-CM | POA: Insufficient documentation

## 2014-07-18 DIAGNOSIS — M791 Myalgia: Secondary | ICD-10-CM | POA: Diagnosis not present

## 2014-07-18 DIAGNOSIS — J45909 Unspecified asthma, uncomplicated: Secondary | ICD-10-CM | POA: Diagnosis not present

## 2014-07-18 DIAGNOSIS — N736 Female pelvic peritoneal adhesions (postinfective): Secondary | ICD-10-CM | POA: Diagnosis not present

## 2014-07-18 DIAGNOSIS — F4310 Post-traumatic stress disorder, unspecified: Secondary | ICD-10-CM | POA: Insufficient documentation

## 2014-07-18 DIAGNOSIS — F17210 Nicotine dependence, cigarettes, uncomplicated: Secondary | ICD-10-CM | POA: Insufficient documentation

## 2014-07-18 DIAGNOSIS — F329 Major depressive disorder, single episode, unspecified: Secondary | ICD-10-CM | POA: Insufficient documentation

## 2014-07-18 DIAGNOSIS — N803 Endometriosis of pelvic peritoneum: Secondary | ICD-10-CM | POA: Insufficient documentation

## 2014-07-18 DIAGNOSIS — N858 Other specified noninflammatory disorders of uterus: Secondary | ICD-10-CM | POA: Insufficient documentation

## 2014-07-18 DIAGNOSIS — M5030 Other cervical disc degeneration, unspecified cervical region: Secondary | ICD-10-CM | POA: Diagnosis not present

## 2014-07-18 HISTORY — PX: ROBOTIC ASSISTED LAPAROSCOPIC LYSIS OF ADHESION: SHX6080

## 2014-07-18 LAB — HCG, SERUM, QUALITATIVE: Preg, Serum: NEGATIVE

## 2014-07-18 SURGERY — ROBOTIC ASSISTED LAPAROSCOPIC LYSIS OF ADHESION
Anesthesia: General | Site: Abdomen

## 2014-07-18 MED ORDER — FENTANYL CITRATE 0.05 MG/ML IJ SOLN
INTRAMUSCULAR | Status: AC
  Administered 2014-07-18: 50 ug via INTRAVENOUS
  Filled 2014-07-18: qty 2

## 2014-07-18 MED ORDER — DIPHENHYDRAMINE HCL 50 MG/ML IJ SOLN
12.5000 mg | Freq: Once | INTRAMUSCULAR | Status: AC
  Administered 2014-07-18: 12.5 mg via INTRAVENOUS

## 2014-07-18 MED ORDER — GLYCOPYRROLATE 0.2 MG/ML IJ SOLN
INTRAMUSCULAR | Status: DC | PRN
  Administered 2014-07-18: 0.2 mg via INTRAVENOUS
  Administered 2014-07-18: .4 mg via INTRAVENOUS

## 2014-07-18 MED ORDER — LIDOCAINE HCL (CARDIAC) 20 MG/ML IV SOLN
INTRAVENOUS | Status: AC
  Filled 2014-07-18: qty 5

## 2014-07-18 MED ORDER — GLYCOPYRROLATE 0.2 MG/ML IJ SOLN
INTRAMUSCULAR | Status: AC
  Filled 2014-07-18: qty 3

## 2014-07-18 MED ORDER — KETOROLAC TROMETHAMINE 30 MG/ML IJ SOLN
INTRAMUSCULAR | Status: AC
  Filled 2014-07-18: qty 1

## 2014-07-18 MED ORDER — MIDAZOLAM HCL 2 MG/2ML IJ SOLN
INTRAMUSCULAR | Status: DC | PRN
  Administered 2014-07-18: 2 mg via INTRAVENOUS

## 2014-07-18 MED ORDER — NEOSTIGMINE METHYLSULFATE 10 MG/10ML IV SOLN
INTRAVENOUS | Status: DC | PRN
  Administered 2014-07-18: 2 mg via INTRAVENOUS
  Administered 2014-07-18: 1 mg via INTRAVENOUS

## 2014-07-18 MED ORDER — ONDANSETRON HCL 4 MG/2ML IJ SOLN
INTRAMUSCULAR | Status: AC
  Filled 2014-07-18: qty 2

## 2014-07-18 MED ORDER — LIDOCAINE HCL (CARDIAC) 20 MG/ML IV SOLN
INTRAVENOUS | Status: DC | PRN
  Administered 2014-07-18: 60 mg via INTRAVENOUS

## 2014-07-18 MED ORDER — SCOPOLAMINE 1 MG/3DAYS TD PT72
MEDICATED_PATCH | TRANSDERMAL | Status: AC
  Administered 2014-07-18: 1.5 mg via TRANSDERMAL
  Filled 2014-07-18: qty 1

## 2014-07-18 MED ORDER — LACTATED RINGERS IR SOLN
Status: DC | PRN
  Administered 2014-07-18: 3000 mL

## 2014-07-18 MED ORDER — ACETAMINOPHEN 160 MG/5ML PO SOLN
650.0000 mg | Freq: Once | ORAL | Status: AC
  Administered 2014-07-18: 650 mg via ORAL

## 2014-07-18 MED ORDER — HYDROMORPHONE HCL 1 MG/ML IJ SOLN
INTRAMUSCULAR | Status: DC | PRN
  Administered 2014-07-18: 1 mg via INTRAVENOUS

## 2014-07-18 MED ORDER — ARTIFICIAL TEARS OP OINT
TOPICAL_OINTMENT | OPHTHALMIC | Status: DC | PRN
  Administered 2014-07-18: 1 via OPHTHALMIC

## 2014-07-18 MED ORDER — SODIUM CHLORIDE 0.9 % IJ SOLN
INTRAMUSCULAR | Status: AC
  Filled 2014-07-18: qty 10

## 2014-07-18 MED ORDER — FENTANYL CITRATE 0.05 MG/ML IJ SOLN
INTRAMUSCULAR | Status: AC
  Filled 2014-07-18: qty 5

## 2014-07-18 MED ORDER — SCOPOLAMINE 1 MG/3DAYS TD PT72
1.0000 | MEDICATED_PATCH | Freq: Once | TRANSDERMAL | Status: DC
  Administered 2014-07-18: 1.5 mg via TRANSDERMAL

## 2014-07-18 MED ORDER — CEFAZOLIN SODIUM-DEXTROSE 2-3 GM-% IV SOLR
INTRAVENOUS | Status: AC
  Filled 2014-07-18: qty 50

## 2014-07-18 MED ORDER — DIPHENHYDRAMINE HCL 50 MG/ML IJ SOLN
INTRAMUSCULAR | Status: AC
  Filled 2014-07-18: qty 1

## 2014-07-18 MED ORDER — DEXAMETHASONE SODIUM PHOSPHATE 4 MG/ML IJ SOLN
INTRAMUSCULAR | Status: AC
  Filled 2014-07-18: qty 1

## 2014-07-18 MED ORDER — ONDANSETRON HCL 4 MG/2ML IJ SOLN
INTRAMUSCULAR | Status: DC | PRN
  Administered 2014-07-18: 4 mg via INTRAVENOUS

## 2014-07-18 MED ORDER — BUPIVACAINE LIPOSOME 1.3 % IJ SUSP
20.0000 mL | Freq: Once | INTRAMUSCULAR | Status: AC
  Administered 2014-07-18: 20 mL
  Filled 2014-07-18: qty 20

## 2014-07-18 MED ORDER — ACETAMINOPHEN 160 MG/5ML PO SOLN
ORAL | Status: AC
  Administered 2014-07-18: 650 mg via ORAL
  Filled 2014-07-18: qty 40.6

## 2014-07-18 MED ORDER — OXYCODONE-ACETAMINOPHEN 5-325 MG PO TABS
ORAL_TABLET | ORAL | Status: AC
  Filled 2014-07-18: qty 1

## 2014-07-18 MED ORDER — BUPIVACAINE HCL (PF) 0.25 % IJ SOLN
INTRAMUSCULAR | Status: AC
  Filled 2014-07-18: qty 10

## 2014-07-18 MED ORDER — LACTATED RINGERS IV SOLN
INTRAVENOUS | Status: DC
  Administered 2014-07-18 (×3): via INTRAVENOUS

## 2014-07-18 MED ORDER — FENTANYL CITRATE 0.05 MG/ML IJ SOLN
INTRAMUSCULAR | Status: DC | PRN
  Administered 2014-07-18: 100 ug via INTRAVENOUS
  Administered 2014-07-18: 50 ug via INTRAVENOUS
  Administered 2014-07-18: 100 ug via INTRAVENOUS

## 2014-07-18 MED ORDER — OXYCODONE-ACETAMINOPHEN 5-325 MG PO TABS: 1.0000 | ORAL_TABLET | ORAL | Status: DC | PRN

## 2014-07-18 MED ORDER — PROPOFOL 10 MG/ML IV BOLUS
INTRAVENOUS | Status: DC | PRN
  Administered 2014-07-18: 200 mg via INTRAVENOUS

## 2014-07-18 MED ORDER — HYDROMORPHONE HCL 1 MG/ML IJ SOLN
INTRAMUSCULAR | Status: AC
  Filled 2014-07-18: qty 1

## 2014-07-18 MED ORDER — MIDAZOLAM HCL 2 MG/2ML IJ SOLN
INTRAMUSCULAR | Status: AC
  Filled 2014-07-18: qty 2

## 2014-07-18 MED ORDER — ALBUTEROL SULFATE HFA 108 (90 BASE) MCG/ACT IN AERS
INHALATION_SPRAY | RESPIRATORY_TRACT | Status: AC
  Filled 2014-07-18: qty 6.7

## 2014-07-18 MED ORDER — BUPIVACAINE HCL (PF) 0.25 % IJ SOLN
INTRAMUSCULAR | Status: DC | PRN
  Administered 2014-07-18: 10 mL

## 2014-07-18 MED ORDER — FENTANYL CITRATE 0.05 MG/ML IJ SOLN
25.0000 ug | INTRAMUSCULAR | Status: DC | PRN
  Administered 2014-07-18 (×2): 50 ug via INTRAVENOUS

## 2014-07-18 MED ORDER — ROCURONIUM BROMIDE 100 MG/10ML IV SOLN
INTRAVENOUS | Status: DC | PRN
  Administered 2014-07-18: 30 mg via INTRAVENOUS
  Administered 2014-07-18: 10 mg via INTRAVENOUS

## 2014-07-18 MED ORDER — SODIUM CHLORIDE 0.9 % IJ SOLN
INTRAMUSCULAR | Status: AC
  Filled 2014-07-18: qty 50

## 2014-07-18 MED ORDER — PROPOFOL 10 MG/ML IV BOLUS
INTRAVENOUS | Status: AC
  Filled 2014-07-18: qty 20

## 2014-07-18 MED ORDER — ARTIFICIAL TEARS OP OINT
TOPICAL_OINTMENT | OPHTHALMIC | Status: AC
  Filled 2014-07-18: qty 3.5

## 2014-07-18 MED ORDER — ROPIVACAINE HCL 5 MG/ML IJ SOLN
INTRAMUSCULAR | Status: AC
  Filled 2014-07-18: qty 60

## 2014-07-18 MED ORDER — CEFAZOLIN SODIUM-DEXTROSE 2-3 GM-% IV SOLR
2.0000 g | INTRAVENOUS | Status: AC
  Administered 2014-07-18: 2 g via INTRAVENOUS

## 2014-07-18 MED ORDER — KETOROLAC TROMETHAMINE 30 MG/ML IJ SOLN
30.0000 mg | Freq: Once | INTRAMUSCULAR | Status: DC
  Administered 2014-07-18: 30 mg via INTRAVENOUS

## 2014-07-18 MED ORDER — DEXAMETHASONE SODIUM PHOSPHATE 10 MG/ML IJ SOLN
INTRAMUSCULAR | Status: DC | PRN
  Administered 2014-07-18: 5 mg via INTRAVENOUS

## 2014-07-18 MED ORDER — SODIUM CHLORIDE 0.9 % IV SOLN
INTRAVENOUS | Status: DC | PRN
  Administered 2014-07-18: 120 mL

## 2014-07-18 MED ORDER — ALBUTEROL SULFATE HFA 108 (90 BASE) MCG/ACT IN AERS
INHALATION_SPRAY | RESPIRATORY_TRACT | Status: DC | PRN
  Administered 2014-07-18: 2 via RESPIRATORY_TRACT

## 2014-07-18 MED ORDER — OXYCODONE-ACETAMINOPHEN 5-325 MG PO TABS
1.0000 | ORAL_TABLET | Freq: Once | ORAL | Status: AC
  Administered 2014-07-18: 1 via ORAL

## 2014-07-18 MED ORDER — NEOSTIGMINE METHYLSULFATE 10 MG/10ML IV SOLN
INTRAVENOUS | Status: AC
Start: 2014-07-18 — End: 2014-07-18
  Filled 2014-07-18: qty 1

## 2014-07-18 SURGICAL SUPPLY — 54 items
BARRIER ADHS 3X4 INTERCEED (GAUZE/BANDAGES/DRESSINGS) IMPLANT
BRR ADH 4X3 ABS CNTRL BYND (GAUZE/BANDAGES/DRESSINGS)
CATH FOLEY 3WAY  5CC 16FR (CATHETERS) ×1
CATH FOLEY 3WAY 5CC 16FR (CATHETERS) ×1 IMPLANT
CHLORAPREP W/TINT 26ML (MISCELLANEOUS) ×2 IMPLANT
CLOTH BEACON ORANGE TIMEOUT ST (SAFETY) ×2 IMPLANT
CONT PATH 16OZ SNAP LID 3702 (MISCELLANEOUS) ×2 IMPLANT
COVER BACK TABLE 60X90IN (DRAPES) ×4 IMPLANT
COVER TIP SHEARS 8 DVNC (MISCELLANEOUS) ×1 IMPLANT
COVER TIP SHEARS 8MM DA VINCI (MISCELLANEOUS) ×1
DECANTER SPIKE VIAL GLASS SM (MISCELLANEOUS) ×2 IMPLANT
DRAPE WARM FLUID 44X44 (DRAPE) ×2 IMPLANT
DRSG COVADERM PLUS 2X2 (GAUZE/BANDAGES/DRESSINGS) ×8 IMPLANT
DRSG OPSITE POSTOP 3X4 (GAUZE/BANDAGES/DRESSINGS) ×2 IMPLANT
ELECT REM PT RETURN 9FT ADLT (ELECTROSURGICAL) ×2
ELECTRODE REM PT RTRN 9FT ADLT (ELECTROSURGICAL) ×1 IMPLANT
GAUZE VASELINE 3X9 (GAUZE/BANDAGES/DRESSINGS) IMPLANT
GLOVE BIO SURGEON STRL SZ7.5 (GLOVE) ×4 IMPLANT
KIT ACCESSORY DA VINCI DISP (KITS) ×1
KIT ACCESSORY DVNC DISP (KITS) ×1 IMPLANT
LEGGING LITHOTOMY PAIR STRL (DRAPES) ×2 IMPLANT
LIQUID BAND (GAUZE/BANDAGES/DRESSINGS) ×2 IMPLANT
NEEDLE HYPO 22GX1.5 SAFETY (NEEDLE) ×2 IMPLANT
NEEDLE INSUFFLATION 150MM (ENDOMECHANICALS) ×2 IMPLANT
PACK ROBOT WH (CUSTOM PROCEDURE TRAY) ×2 IMPLANT
PACK ROBOTIC GOWN (GOWN DISPOSABLE) ×2 IMPLANT
PAD POSITIONER PINK NONSTERILE (MISCELLANEOUS) ×2 IMPLANT
PAD PREP 24X48 CUFFED NSTRL (MISCELLANEOUS) ×4 IMPLANT
SCISSORS LAP 5X35 DISP (ENDOMECHANICALS) ×2 IMPLANT
SET CYSTO W/LG BORE CLAMP LF (SET/KITS/TRAYS/PACK) IMPLANT
SET IRRIG TUBING LAPAROSCOPIC (IRRIGATION / IRRIGATOR) ×2 IMPLANT
SET TRI-LUMEN FLTR TB AIRSEAL (TUBING) ×2 IMPLANT
SUT VIC AB 0 CT1 27 (SUTURE) ×4
SUT VIC AB 0 CT1 27XBRD ANBCTR (SUTURE) ×2 IMPLANT
SUT VIC AB 0 CT1 27XBRD ANTBC (SUTURE) IMPLANT
SUT VICRYL 0 UR6 27IN ABS (SUTURE) ×2 IMPLANT
SUT VICRYL RAPIDE 4/0 PS 2 (SUTURE) ×4 IMPLANT
SYR 50ML LL SCALE MARK (SYRINGE) ×2 IMPLANT
SYR CONTROL 10ML LL (SYRINGE) ×2 IMPLANT
SYRINGE 10CC LL (SYRINGE) ×2 IMPLANT
SYSTEM CONVERTIBLE TROCAR (TROCAR) IMPLANT
TIP UTERINE 5.1X6CM LAV DISP (MISCELLANEOUS) IMPLANT
TIP UTERINE 6.7X10CM GRN DISP (MISCELLANEOUS) IMPLANT
TIP UTERINE 6.7X6CM WHT DISP (MISCELLANEOUS) IMPLANT
TIP UTERINE 6.7X8CM BLUE DISP (MISCELLANEOUS) ×2 IMPLANT
TOWEL OR 17X24 6PK STRL BLUE (TOWEL DISPOSABLE) ×6 IMPLANT
TROCAR DISP BLADELESS 8 DVNC (TROCAR) ×1 IMPLANT
TROCAR DISP BLADELESS 8MM (TROCAR) ×1
TROCAR PORT AIRSEAL 5X120 (TROCAR) ×2 IMPLANT
TROCAR XCEL 12X100 BLDLESS (ENDOMECHANICALS) IMPLANT
TROCAR XCEL NON-BLD 5MMX100MML (ENDOMECHANICALS) ×2 IMPLANT
TROCAR Z-THREAD 12X150 (TROCAR) ×2 IMPLANT
WARMER LAPAROSCOPE (MISCELLANEOUS) ×2 IMPLANT
WATER STERILE IRR 1000ML POUR (IV SOLUTION) ×6 IMPLANT

## 2014-07-18 NOTE — Anesthesia Postprocedure Evaluation (Signed)
  Anesthesia Post-op Note  Patient: [name]  Procedure(s) Performed: Procedure(s) (LRB): ROBOTIC ASSISTED LAPAROSCOPIC  EXCISION POSTERIOR  UTERINE WALL MASS; EXCISION RIGHT MESSALEINGEAL MASS, EXCISION AND ABLATION CULDASAC ENDOMETRIOSIS (N/A)  Patient Location: PACU  Anesthesia Type: General  Level of Consciousness: awake and alert   Airway and Oxygen Therapy: Patient Spontanous Breathing  Post-op Pain: mild  Post-op Assessment: Post-op Vital signs reviewed, Patient's Cardiovascular Status Stable, Respiratory Function Stable, Patent Airway and No signs of Nausea or vomiting  Last Vitals:  Filed Vitals:   07/18/14 1300  BP: 129/65  Pulse: 102  Temp: 37 C  Resp: 20    Post-op Vital Signs: stable   Complications: No apparent anesthesia complications

## 2014-07-18 NOTE — Op Note (Signed)
07/18/2014  11:58 AM  PATIENT:  [name]  39 y.o. female  PRE-OPERATIVE DIAGNOSIS:  Endometriosis  POST-OPERATIVE DIAGNOSIS:  endometriosis  PROCEDURE:  Procedure(s): ROBOTIC ASSISTED LAPAROSCOPIC  EXCISION POSTERIOR  UTERINE WALL MASS; EXCISION RIGHT MESOSALPINGEAL MASS, EXCISION AND ABLATION CULDESAC ENDOMETRIOSIS LYSIS OF ADHESIONS LEFT PARATUBAL CYSTOTOMY  SURGEON:  Surgeon(s): Brien Few, MD  ASSISTANTSDrake Leach, CNM   ANESTHESIA:   local and general  ESTIMATED BLOOD LOSS: MINIMAL  DRAINS: Urinary Catheter (Foley)   LOCAL MEDICATIONS USED:  MARCAINE    and Amount: 30 ml  SPECIMEN:  Source of Specimen:  RIGHT MESOSALPINGEAL MASS, CUL DE SAC IMPLANTS, UTERINE WALL MASS  DISPOSITION OF SPECIMEN:  PATHOLOGY  COUNTS:  YES   DICTATION #: [id number]  PLAN OF CARE: DC HOME  PATIENT DISPOSITION:  PACU - hemodynamically stable.

## 2014-07-18 NOTE — Anesthesia Procedure Notes (Signed)
Procedure Name: Intubation Date/Time: 07/18/2014 10:20 AM Performed by: Elenore Paddy Pre-anesthesia Checklist: Patient identified, Emergency Drugs available, Suction available, Patient being monitored and Timeout performed Patient Re-evaluated:Patient Re-evaluated prior to inductionOxygen Delivery Method: Circle system utilized Preoxygenation: Pre-oxygenation with 100% oxygen Intubation Type: IV induction Ventilation: Mask ventilation without difficulty Laryngoscope Size: Mac and 3 Grade View: Grade I Tube type: Oral Tube size: 7.0 mm Number of attempts: 1 Airway Equipment and Method: Stylet Placement Confirmation: ETT inserted through vocal cords under direct vision Secured at: 20 cm Tube secured with: Tape Dental Injury: Teeth and Oropharynx as per pre-operative assessment

## 2014-07-18 NOTE — Discharge Instructions (Signed)

## 2014-07-18 NOTE — Transfer of Care (Signed)
Immediate Anesthesia Transfer of Care Note  Patient: [name]  Procedure(s) Performed: Procedure(s): ROBOTIC ASSISTED LAPAROSCOPIC  EXCISION POSTERIOR  UTERINE WALL MASS; EXCISION RIGHT MESSALEINGEAL MASS, EXCISION AND ABLATION CULDASAC ENDOMETRIOSIS (N/A)  Patient Location: PACU  Anesthesia Type:General  Level of Consciousness: awake, alert  and oriented  Airway & Oxygen Therapy: Patient Spontanous Breathing and Patient connected to nasal cannula oxygen  Post-op Assessment: Report given to RN and Post -op Vital signs reviewed and stable  Post vital signs: Reviewed and stable  Last Vitals:  Filed Vitals:   07/18/14 0834  BP: 120/84  Pulse: 88  Temp: 36.7 C  Resp: 16    Complications: No apparent anesthesia complications

## 2014-07-18 NOTE — Anesthesia Preprocedure Evaluation (Signed)
Anesthesia Evaluation  Patient identified by MRN, date of birth, ID band Patient awake    Reviewed: Allergy & Precautions, H&P , Patient's Chart, lab work & pertinent test results, reviewed documented beta blocker date and time   Airway Mallampati: II  TM Distance: >3 FB Neck ROM: full    Dental no notable dental hx.    Pulmonary asthma , Current Smoker,  breath sounds clear to auscultation  Pulmonary exam normal       Cardiovascular Rhythm:regular Rate:Normal     Neuro/Psych    GI/Hepatic   Endo/Other    Renal/GU      Musculoskeletal   Abdominal   Peds  Hematology   Anesthesia Other Findings   Reproductive/Obstetrics                             Anesthesia Physical Anesthesia Plan  ASA: II  Anesthesia Plan: General   Post-op Pain Management:    Induction: Intravenous  Airway Management Planned: Oral ETT  Additional Equipment:   Intra-op Plan:   Post-operative Plan: Extubation in OR  Informed Consent: I have reviewed the patients History and Physical, chart, labs and discussed the procedure including the risks, benefits and alternatives for the proposed anesthesia with the patient or authorized representative who has indicated his/her understanding and acceptance.   Dental Advisory Given and Dental advisory given  Plan Discussed with: CRNA and Surgeon  Anesthesia Plan Comments: (  Discussed general anesthesia, including possible nausea, instrumentation of airway, sore throat,pulmonary aspiration, etc. I asked if the were any outstanding questions, or  concerns before we proceeded. )        Anesthesia Quick Evaluation

## 2014-07-18 NOTE — Progress Notes (Signed)
Patient ID: [name], female   DOB: [birth year], 39 y.o.   MRN: [id number] Patient seen and examined. Consent witnessed and signed. No changes noted. Update completed.

## 2014-07-19 NOTE — Op Note (Signed)
[name], [place]      ACCOUNT NO.:  [id number]  MEDICAL RECORD NO.:  [id number]  LOCATION:  [facility]                          FACILITY:  [facility]  PHYSICIAN:  Lovenia Kim, M.D.DATE OF BIRTH:  05/26/1975  DATE OF PROCEDURE:  07/18/2014 DATE OF DISCHARGE:  07/18/2014                              OPERATIVE REPORT   PREOPERATIVE DIAGNOSES:  Pelvic pain, dysmenorrhea, history of pelvic endometriosis.  POSTOPERATIVE DIAGNOSIS:  Pelvic endometriosis, right mesosalpingeal mass, posterior uterine wall mass, left paratubal cyst, pelvic and abdominal wall adhesions.  PROCEDURE:  Da Vinci-assisted laparoscopic lysis of adhesions, excision of posterior uterine wall mass, excision of right mesosalpingeal mass, excision and ablation of cul-de-sac endometriosis, left paratubal cystotomy.  SURGEON:  Lovenia Kim, M.D.  Terrence DupontDrake Leach.  ANESTHESIA:  General.  ESTIMATED BLOOD LOSS:  Less than 50 mL.  COMPLICATIONS:  None.  DRAINS:  None.  COUNTS:  Correct.  DISPOSITION:  The patient to recovery in good condition.  DESCRIPTION OF PROCEDURE:  After being apprised of risks of anesthesia, infection, bleeding, injury to surrounding organs, possible need for repair, delayed versus immediate complications to include bowel and bladder injury, possible need for repair, the patient was brought to the operating room where she was administered general anesthetic without complications.  Prepped and draped in usual sterile fashion.  Foley catheter placed.  RUMI retractor placed vaginally without difficulty. Exam under anesthesia reveals an anteflexed uterus and no adnexal masses.  Infraumbilical incision made with a scalpel.  Veress needle placed opening pressure -2, 3 L CO2 insufflated without difficulty. Trocar was placed atraumatically and pictures taken.  Normal liver gallbladder bed, normal diaphragm.  Appendix not seen.  At this time, two ports were placed; on the left a 5  mm, and an 8 mm on the right. Endo Shears were used to lyse the anterior abdominal wall adhesions to the omentum down to the level of the uterus.  Deep Trendelenburg position was established, and the robot was docked in a standard fashion.  AirSeal is initiated.  At this time, Endo Shears and PK forceps were placed.  Lysis of omental adhesions was further accomplished.  There was a posterior uterine wall mass which was excised sharply and sent to Pathology.  Anteriorly, there were bladder flap adhesions which were lysed sharply and right adnexal adhesions were lysed sharply.  There was a mass apparent in the right mesosalpinx distinct from the right ovary.  The mesosalpinx was opened.  The cyst contained clear fluid.  Thick walled cyst was excised without difficulty and sent to Pathology.  Good hemostasis noted.  In the left adnexa, there was a small left paratubal cyst which was aspirated without difficulty.  Good hemostasis was noted.  Irrigation was accomplished. At this time in the posterior cul-de-sac, there were some vesicular lesions questionably consistent with pelvic endometriosis, which were excised and some smaller lesions which were cauterized.  At this time, procedure was terminated.  Good hemostasis was noted.  Bupivacaine was placed in the pelvis.  All ports removed under direct visualization. CO2 released.  Incisions closed with 0 Vicryl, 4-0 Vicryl, and Dermabond.  RUMI retractor removed.  Foley catheter removed.  The patient tolerated the procedure well, was awakened and transferred to  recovery after placement of Exparel in the abdominal incisions, and she was then moved to recovery without complications.     Lovenia Kim, M.D.     RJT/MEDQ  D:  07/18/2014  T:  07/19/2014  Job:  [id number]

## 2014-07-20 ENCOUNTER — Encounter: Payer: Self-pay | Admitting: Physician Assistant

## 2014-07-20 DIAGNOSIS — N809 Endometriosis, unspecified: Secondary | ICD-10-CM | POA: Insufficient documentation

## 2014-07-21 ENCOUNTER — Encounter (HOSPITAL_COMMUNITY): Payer: Self-pay | Admitting: Obstetrics and Gynecology

## 2014-07-22 ENCOUNTER — Ambulatory Visit (HOSPITAL_COMMUNITY): Payer: Self-pay | Admitting: Psychiatry

## 2014-07-24 ENCOUNTER — Encounter: Payer: 59 | Admitting: Physical Therapy

## 2014-07-30 ENCOUNTER — Other Ambulatory Visit: Payer: Self-pay | Admitting: Physician Assistant

## 2014-07-30 ENCOUNTER — Ambulatory Visit: Payer: Self-pay | Admitting: Sports Medicine

## 2014-07-31 ENCOUNTER — Other Ambulatory Visit: Payer: Self-pay | Admitting: Physician Assistant

## 2014-08-01 ENCOUNTER — Encounter: Payer: Self-pay | Admitting: Physician Assistant

## 2014-08-01 ENCOUNTER — Encounter: Payer: 59 | Admitting: Physical Therapy

## 2014-08-01 ENCOUNTER — Ambulatory Visit (INDEPENDENT_AMBULATORY_CARE_PROVIDER_SITE_OTHER): Payer: 59 | Admitting: Physician Assistant

## 2014-08-01 VITALS — BP 141/70 | HR 125 | Ht 63.0 in | Wt 134.0 lb

## 2014-08-01 DIAGNOSIS — F32A Depression, unspecified: Secondary | ICD-10-CM

## 2014-08-01 DIAGNOSIS — F411 Generalized anxiety disorder: Secondary | ICD-10-CM | POA: Diagnosis not present

## 2014-08-01 DIAGNOSIS — F4310 Post-traumatic stress disorder, unspecified: Secondary | ICD-10-CM

## 2014-08-01 DIAGNOSIS — F329 Major depressive disorder, single episode, unspecified: Secondary | ICD-10-CM

## 2014-08-01 DIAGNOSIS — R000 Tachycardia, unspecified: Secondary | ICD-10-CM

## 2014-08-01 MED ORDER — METOPROLOL SUCCINATE ER 50 MG PO TB24: 50.0000 mg | ORAL_TABLET | Freq: Every day | ORAL | Status: DC

## 2014-08-01 MED ORDER — PREGABALIN 150 MG PO CAPS: 150.0000 mg | ORAL_CAPSULE | Freq: Two times a day (BID) | ORAL | Status: DC

## 2014-08-01 MED ORDER — DULOXETINE HCL 60 MG PO CPEP: 60.0000 mg | ORAL_CAPSULE | Freq: Every day | ORAL | Status: DC

## 2014-08-01 MED ORDER — ALPRAZOLAM 1 MG PO TABS: 1.0000 mg | ORAL_TABLET | Freq: Two times a day (BID) | ORAL | Status: DC | PRN

## 2014-08-01 NOTE — Progress Notes (Signed)
   Subjective:    Patient ID: [name], female    DOB: [birth year], 39 y.o.   MRN: [id number]  HPI Pt presents to the clinic for medication refill.   Left shoulder pain/myofascial pain- much improvement with injections and lyrica. Needs refill.   Anxiety/Depression- still very out of control. Working with [employer] downstairs to find regimen that works. Increasing cymbalta made things worse not better.   Insomnia- still not better. Tried restoril with little benefit. Has follow up with sleep medicine in next week.    Review of Systems  All other systems reviewed and are negative.      Objective:   Physical Exam  Constitutional: She is oriented to person, place, and time. She appears well-developed and well-nourished.  HENT:  Head: Normocephalic and atraumatic.  Cardiovascular: Regular rhythm and normal heart sounds.   Tachycardia at 125.   Pulmonary/Chest: Effort normal and breath sounds normal. She has no wheezes.  Neurological: She is alert and oriented to person, place, and time.  Skin: Skin is dry.          Assessment & Plan:  Left shoulder pain/myofascial pain- improving with injections. Refilled lyrica for 6 months.   Tachycardia-no CP. She does admit to feeling heart race. we had kept blaming on anxiety today will treat. Perhaps BB will help some with anxiety as well. metroprol 25mg  daily started. Follow up HR in 1 month.   Anxiety/PTSD/depression- managed downstairs. Pt has no refills and request one month supply per pt has appt next week. Will refill current dosage for one month only.   Insomnia- working with sleep specialist to find regimen that helps with sleep.

## 2014-08-01 NOTE — Telephone Encounter (Signed)
Can we find out if pt is taking or NOT taking lyrica?

## 2014-08-04 ENCOUNTER — Encounter: Payer: Self-pay | Admitting: Physical Therapy

## 2014-08-05 ENCOUNTER — Encounter (HOSPITAL_COMMUNITY): Payer: Self-pay | Admitting: Psychiatry

## 2014-08-05 ENCOUNTER — Ambulatory Visit (INDEPENDENT_AMBULATORY_CARE_PROVIDER_SITE_OTHER): Payer: 59 | Admitting: Sports Medicine

## 2014-08-05 ENCOUNTER — Encounter: Payer: Self-pay | Admitting: Sports Medicine

## 2014-08-05 ENCOUNTER — Ambulatory Visit (INDEPENDENT_AMBULATORY_CARE_PROVIDER_SITE_OTHER): Payer: 59 | Admitting: Psychiatry

## 2014-08-05 VITALS — BP 106/77 | HR 90 | Ht 63.0 in | Wt 137.0 lb

## 2014-08-05 VITALS — BP 117/78 | HR 104 | Wt 137.0 lb

## 2014-08-05 DIAGNOSIS — F4310 Post-traumatic stress disorder, unspecified: Secondary | ICD-10-CM | POA: Diagnosis not present

## 2014-08-05 DIAGNOSIS — M791 Myalgia: Secondary | ICD-10-CM | POA: Diagnosis not present

## 2014-08-05 DIAGNOSIS — M5030 Other cervical disc degeneration, unspecified cervical region: Secondary | ICD-10-CM

## 2014-08-05 DIAGNOSIS — F39 Unspecified mood [affective] disorder: Secondary | ICD-10-CM

## 2014-08-05 DIAGNOSIS — M7918 Myalgia, other site: Secondary | ICD-10-CM

## 2014-08-05 DIAGNOSIS — F0630 Mood disorder due to known physiological condition, unspecified: Secondary | ICD-10-CM

## 2014-08-05 DIAGNOSIS — F411 Generalized anxiety disorder: Secondary | ICD-10-CM

## 2014-08-05 MED ORDER — DULOXETINE HCL 30 MG PO CPEP: 30.0000 mg | ORAL_CAPSULE | Freq: Every day | ORAL | Status: DC

## 2014-08-05 MED ORDER — CLONAZEPAM 1 MG PO TABS: ORAL_TABLET | ORAL | Status: DC
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
## 2014-08-05 NOTE — Progress Notes (Signed)
  Subjective:    CC: follow-up after her epidural  HPI: We had a difficult time getting a hold of Amberlie symptoms, she has had a multilevel cervical fusion with residual disc protrusion and foraminal stenosis. She had persistent pain, she also had some symptoms highly suggestive of a concurrent myofascial pain syndrome. We switched her to Cymbalta and she is responded very well to 60 mg, 120 was too high. We also did a left-sided cervical epidural, which has resulted in complete pain relief in her neck and left shoulder. She is extremely happy with results so far, she would like to discontinue physical therapy. Understandably she wants to pursue further surgical intervention, possibly looking for a cure.  Past medical history, Surgical history, Family history not pertinant except as noted below, Social history, Allergies, and medications have been entered into the medical record, reviewed, and no changes needed.   Review of Systems: No fevers, chills, night sweats, weight loss, chest pain, or shortness of breath.   Objective:    General: Well Developed, well nourished, and in no acute distress.  Neuro: Alert and oriented x3, extra-ocular muscles intact, sensation grossly intact.  HEENT: Normocephalic, atraumatic, pupils equal round reactive to light, neck supple, no masses, no lymphadenopathy, thyroid nonpalpable.  Skin: Warm and dry, no rashes. Cardiac: Regular rate and rhythm, no murmurs rubs or gallops, no lower extremity edema.  Respiratory: Clear to auscultation bilaterally. Not using accessory muscles, speaking in full sentences.  Impression and Recommendations:

## 2014-08-05 NOTE — Assessment & Plan Note (Signed)
[name] is post-cervical ACDF with residual left-sided foraminal stenosis. She did have a very large myofascial component of her pain which has improved significantly on 60 mg of Cymbalta, and 300 mg twice a day of Lyrica. More recently we set her up for a cervical epidural, and she returns 100% pain free and very happy with how things are going so far. I'm going to increase her Cymbalta to 90 mg, I did tell her that we should continue with nonsurgical measures, and that considering her fantastic response to her cervical epidural it was probably not wise to try and pursue further surgical intervention. Nonetheless she does want a referral to a different neurosurgeon for a second opinion. Return to see me in 6 weeks to see how things are going.

## 2014-08-05 NOTE — Assessment & Plan Note (Signed)
Assessment and plan above, increasing to Cymbalta 90, she has some 60 mg pills, I am going to add 30 mg pills.

## 2014-08-13 ENCOUNTER — Ambulatory Visit (INDEPENDENT_AMBULATORY_CARE_PROVIDER_SITE_OTHER): Payer: 59 | Admitting: Physician Assistant

## 2014-08-13 VITALS — BP 115/81 | HR 83

## 2014-08-13 DIAGNOSIS — R000 Tachycardia, unspecified: Secondary | ICD-10-CM | POA: Insufficient documentation

## 2014-08-13 MED ORDER — METOPROLOL SUCCINATE ER 50 MG PO TB24: 50.0000 mg | ORAL_TABLET | Freq: Every day | ORAL | Status: DC

## 2014-08-13 NOTE — Progress Notes (Signed)
   Subjective:    Patient ID: [name], female    DOB: 1975/07/11, 39 y.o.   MRN: [id number]  HPI Patient came into office with her daughter, and was added on to the nurse visit schedule for a BP and Pulse check. Patient was recently started on Metoprolol.   Review of Systems     Objective:   Physical Exam        Assessment & Plan:  Patients vitals were recorded, and Pt advised by [place] [facility] to continue taking the medication as prescribed. Verbalized understanding.

## 2014-08-19 ENCOUNTER — Other Ambulatory Visit: Payer: Self-pay | Admitting: *Deleted

## 2014-08-19 ENCOUNTER — Telehealth: Payer: Self-pay | Admitting: *Deleted

## 2014-08-19 DIAGNOSIS — M7918 Myalgia, other site: Secondary | ICD-10-CM

## 2014-08-19 MED ORDER — PREGABALIN 150 MG PO CAPS: 150.0000 mg | ORAL_CAPSULE | Freq: Two times a day (BID) | ORAL | Status: DC

## 2014-08-19 MED ORDER — MELOXICAM 15 MG PO TABS: 15.0000 mg | ORAL_TABLET | Freq: Every day | ORAL | Status: DC

## 2014-08-19 MED ORDER — DULOXETINE HCL 60 MG PO CPEP: 60.0000 mg | ORAL_CAPSULE | Freq: Every day | ORAL | Status: DC

## 2014-08-19 NOTE — Telephone Encounter (Signed)
Ok to send 90 day supply for patient for below medications.

## 2014-08-19 NOTE — Telephone Encounter (Signed)
Patient called stating that she is going to loose her benefits with [employer] although she is still employed but her employee status has changed. She is wanting  to have a refill for @ least 3 months until her status changes back.  the medications she is asking for 3 month refills are : Lyrica 150 mg, Toprol XL 50 mg, Cymbalta 60 mg, Meloxicam 15 mg.

## 2014-08-21 ENCOUNTER — Encounter (HOSPITAL_BASED_OUTPATIENT_CLINIC_OR_DEPARTMENT_OTHER): Payer: Self-pay | Admitting: *Deleted

## 2014-08-21 NOTE — Progress Notes (Signed)
Pt states that she does not have hypertension, takes Toprol for elevated HR at times.  Will not need lab work for surgery 08/25/14.

## 2014-08-22 ENCOUNTER — Encounter (HOSPITAL_BASED_OUTPATIENT_CLINIC_OR_DEPARTMENT_OTHER): Payer: Self-pay | Admitting: *Deleted

## 2014-08-22 ENCOUNTER — Emergency Department (HOSPITAL_BASED_OUTPATIENT_CLINIC_OR_DEPARTMENT_OTHER)
Admission: EM | Admit: 2014-08-22 | Discharge: 2014-08-22 | Disposition: A | Discharge status: Home / Self Care | Payer: 59 | Attending: Emergency Medicine | Admitting: Emergency Medicine

## 2014-08-22 ENCOUNTER — Emergency Department (HOSPITAL_BASED_OUTPATIENT_CLINIC_OR_DEPARTMENT_OTHER): Payer: 59

## 2014-08-22 ENCOUNTER — Other Ambulatory Visit: Payer: Self-pay | Admitting: Orthopedic Surgery

## 2014-08-22 DIAGNOSIS — Y998 Other external cause status: Secondary | ICD-10-CM | POA: Insufficient documentation

## 2014-08-22 DIAGNOSIS — Z720 Tobacco use: Secondary | ICD-10-CM | POA: Insufficient documentation

## 2014-08-22 DIAGNOSIS — Y9389 Activity, other specified: Secondary | ICD-10-CM | POA: Diagnosis not present

## 2014-08-22 DIAGNOSIS — Z8719 Personal history of other diseases of the digestive system: Secondary | ICD-10-CM | POA: Insufficient documentation

## 2014-08-22 DIAGNOSIS — Z79899 Other long term (current) drug therapy: Secondary | ICD-10-CM | POA: Diagnosis not present

## 2014-08-22 DIAGNOSIS — Z791 Long term (current) use of non-steroidal anti-inflammatories (NSAID): Secondary | ICD-10-CM | POA: Diagnosis not present

## 2014-08-22 DIAGNOSIS — S161XXA Strain of muscle, fascia and tendon at neck level, initial encounter: Secondary | ICD-10-CM | POA: Diagnosis not present

## 2014-08-22 DIAGNOSIS — Z8669 Personal history of other diseases of the nervous system and sense organs: Secondary | ICD-10-CM | POA: Diagnosis not present

## 2014-08-22 DIAGNOSIS — Y92410 Unspecified street and highway as the place of occurrence of the external cause: Secondary | ICD-10-CM | POA: Diagnosis not present

## 2014-08-22 DIAGNOSIS — J45909 Unspecified asthma, uncomplicated: Secondary | ICD-10-CM | POA: Diagnosis not present

## 2014-08-22 DIAGNOSIS — F419 Anxiety disorder, unspecified: Secondary | ICD-10-CM | POA: Diagnosis not present

## 2014-08-22 DIAGNOSIS — Z87448 Personal history of other diseases of urinary system: Secondary | ICD-10-CM | POA: Insufficient documentation

## 2014-08-22 DIAGNOSIS — S199XXA Unspecified injury of neck, initial encounter: Secondary | ICD-10-CM | POA: Diagnosis present

## 2014-08-22 MED ORDER — HYDROCODONE-ACETAMINOPHEN 5-325 MG PO TABS: 1.0000 | ORAL_TABLET | ORAL | Status: DC | PRN

## 2014-08-22 MED ORDER — HYDROCODONE-ACETAMINOPHEN 5-325 MG PO TABS
1.0000 | ORAL_TABLET | Freq: Once | ORAL | Status: AC
  Administered 2014-08-22: 1 via ORAL
  Filled 2014-08-22: qty 1

## 2014-08-22 MED ORDER — METHOCARBAMOL 500 MG PO TABS: 500.0000 mg | ORAL_TABLET | Freq: Two times a day (BID) | ORAL | Status: DC
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
## 2014-08-22 NOTE — Discharge Instructions (Signed)
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
## 2014-08-25 ENCOUNTER — Encounter (HOSPITAL_BASED_OUTPATIENT_CLINIC_OR_DEPARTMENT_OTHER)
Admission: RE | Disposition: A | Discharge status: Home / Self Care | Payer: Self-pay | Source: Ambulatory Visit | Attending: Orthopedic Surgery

## 2014-08-25 ENCOUNTER — Ambulatory Visit (HOSPITAL_BASED_OUTPATIENT_CLINIC_OR_DEPARTMENT_OTHER)
Admission: RE | Admit: 2014-08-25 | Discharge: 2014-08-25 | Disposition: A | Discharge status: Home / Self Care | Payer: 59 | Source: Ambulatory Visit | Attending: Orthopedic Surgery | Admitting: Orthopedic Surgery

## 2014-08-25 ENCOUNTER — Ambulatory Visit (HOSPITAL_BASED_OUTPATIENT_CLINIC_OR_DEPARTMENT_OTHER): Payer: 59 | Admitting: Certified Registered"

## 2014-08-25 ENCOUNTER — Encounter (HOSPITAL_BASED_OUTPATIENT_CLINIC_OR_DEPARTMENT_OTHER): Payer: Self-pay | Admitting: Certified Registered"

## 2014-08-25 DIAGNOSIS — M1990 Unspecified osteoarthritis, unspecified site: Secondary | ICD-10-CM | POA: Insufficient documentation

## 2014-08-25 DIAGNOSIS — Z981 Arthrodesis status: Secondary | ICD-10-CM | POA: Insufficient documentation

## 2014-08-25 DIAGNOSIS — G5601 Carpal tunnel syndrome, right upper limb: Secondary | ICD-10-CM | POA: Insufficient documentation

## 2014-08-25 DIAGNOSIS — Z79899 Other long term (current) drug therapy: Secondary | ICD-10-CM | POA: Insufficient documentation

## 2014-08-25 DIAGNOSIS — F17210 Nicotine dependence, cigarettes, uncomplicated: Secondary | ICD-10-CM | POA: Diagnosis not present

## 2014-08-25 DIAGNOSIS — G8929 Other chronic pain: Secondary | ICD-10-CM | POA: Insufficient documentation

## 2014-08-25 DIAGNOSIS — J45909 Unspecified asthma, uncomplicated: Secondary | ICD-10-CM | POA: Diagnosis not present

## 2014-08-25 DIAGNOSIS — F329 Major depressive disorder, single episode, unspecified: Secondary | ICD-10-CM | POA: Insufficient documentation

## 2014-08-25 DIAGNOSIS — F419 Anxiety disorder, unspecified: Secondary | ICD-10-CM | POA: Diagnosis not present

## 2014-08-25 DIAGNOSIS — Z791 Long term (current) use of non-steroidal anti-inflammatories (NSAID): Secondary | ICD-10-CM | POA: Insufficient documentation

## 2014-08-25 DIAGNOSIS — Z79891 Long term (current) use of opiate analgesic: Secondary | ICD-10-CM | POA: Insufficient documentation

## 2014-08-25 DIAGNOSIS — K589 Irritable bowel syndrome without diarrhea: Secondary | ICD-10-CM | POA: Diagnosis not present

## 2014-08-25 DIAGNOSIS — G4700 Insomnia, unspecified: Secondary | ICD-10-CM | POA: Insufficient documentation

## 2014-08-25 DIAGNOSIS — Z9889 Other specified postprocedural states: Secondary | ICD-10-CM | POA: Diagnosis not present

## 2014-08-25 HISTORY — PX: CARPAL TUNNEL RELEASE: SHX101

## 2014-08-25 SURGERY — CARPAL TUNNEL RELEASE
Anesthesia: General | Laterality: Right

## 2014-08-25 MED ORDER — CEFAZOLIN SODIUM-DEXTROSE 2-3 GM-% IV SOLR
INTRAVENOUS | Status: AC
  Filled 2014-08-25: qty 50

## 2014-08-25 MED ORDER — MIDAZOLAM HCL 5 MG/5ML IJ SOLN
INTRAMUSCULAR | Status: DC | PRN
  Administered 2014-08-25: 2 mg via INTRAVENOUS

## 2014-08-25 MED ORDER — PROMETHAZINE HCL 25 MG/ML IJ SOLN: 6.2500 mg | INTRAMUSCULAR | Status: DC | PRN

## 2014-08-25 MED ORDER — CHLORHEXIDINE GLUCONATE 4 % EX LIQD
60.0000 mL | Freq: Once | CUTANEOUS | Status: DC
Start: 2014-08-25 — End: 2014-08-25

## 2014-08-25 MED ORDER — DEXAMETHASONE SODIUM PHOSPHATE 10 MG/ML IJ SOLN
INTRAMUSCULAR | Status: DC | PRN
  Administered 2014-08-25: 10 mg via INTRAVENOUS

## 2014-08-25 MED ORDER — PROPOFOL 10 MG/ML IV BOLUS
INTRAVENOUS | Status: DC | PRN
  Administered 2014-08-25: 150 mg via INTRAVENOUS

## 2014-08-25 MED ORDER — FENTANYL CITRATE (PF) 100 MCG/2ML IJ SOLN
25.0000 ug | INTRAMUSCULAR | Status: DC | PRN
  Administered 2014-08-25 (×2): 50 ug via INTRAVENOUS

## 2014-08-25 MED ORDER — FENTANYL CITRATE (PF) 100 MCG/2ML IJ SOLN
INTRAMUSCULAR | Status: AC
  Filled 2014-08-25: qty 2

## 2014-08-25 MED ORDER — 0.9 % SODIUM CHLORIDE (POUR BTL) OPTIME
TOPICAL | Status: DC | PRN
  Administered 2014-08-25: 200 mL

## 2014-08-25 MED ORDER — OXYCODONE HCL 5 MG PO TABS
5.0000 mg | ORAL_TABLET | Freq: Once | ORAL | Status: AC | PRN
  Administered 2014-08-25: 5 mg via ORAL

## 2014-08-25 MED ORDER — BUPIVACAINE HCL (PF) 0.25 % IJ SOLN
INTRAMUSCULAR | Status: AC
  Filled 2014-08-25: qty 30

## 2014-08-25 MED ORDER — MIDAZOLAM HCL 2 MG/2ML IJ SOLN
INTRAMUSCULAR | Status: AC
  Filled 2014-08-25: qty 2

## 2014-08-25 MED ORDER — HYDROMORPHONE HCL 1 MG/ML IJ SOLN: 0.2500 mg | INTRAMUSCULAR | Status: DC | PRN

## 2014-08-25 MED ORDER — LACTATED RINGERS IV SOLN
INTRAVENOUS | Status: DC
  Administered 2014-08-25 (×2): via INTRAVENOUS

## 2014-08-25 MED ORDER — CEFAZOLIN SODIUM-DEXTROSE 2-3 GM-% IV SOLR
2.0000 g | INTRAVENOUS | Status: DC
  Administered 2014-08-25: 2 g via INTRAVENOUS

## 2014-08-25 MED ORDER — FENTANYL CITRATE (PF) 100 MCG/2ML IJ SOLN
INTRAMUSCULAR | Status: DC | PRN
  Administered 2014-08-25: 25 ug via INTRAVENOUS
  Administered 2014-08-25: 50 ug via INTRAVENOUS
  Administered 2014-08-25: 25 ug via INTRAVENOUS

## 2014-08-25 MED ORDER — CEFAZOLIN SODIUM-DEXTROSE 2-3 GM-% IV SOLR: 2.0000 g | INTRAVENOUS | Status: DC

## 2014-08-25 MED ORDER — KETOROLAC TROMETHAMINE 30 MG/ML IJ SOLN: 30.0000 mg | Freq: Once | INTRAMUSCULAR | Status: DC | PRN

## 2014-08-25 MED ORDER — CHLORHEXIDINE GLUCONATE 4 % EX LIQD: 60.0000 mL | Freq: Once | CUTANEOUS | Status: DC

## 2014-08-25 MED ORDER — LIDOCAINE HCL (CARDIAC) 20 MG/ML IV SOLN
INTRAVENOUS | Status: DC | PRN
  Administered 2014-08-25: 60 mg via INTRAVENOUS

## 2014-08-25 MED ORDER — BUPIVACAINE HCL (PF) 0.25 % IJ SOLN
INTRAMUSCULAR | Status: DC | PRN
  Administered 2014-08-25: 10 mL

## 2014-08-25 MED ORDER — OXYCODONE HCL 5 MG PO TABS
ORAL_TABLET | ORAL | Status: AC
  Filled 2014-08-25: qty 1

## 2014-08-25 MED ORDER — OXYCODONE HCL 5 MG/5ML PO SOLN: 5.0000 mg | Freq: Once | ORAL | Status: AC | PRN

## 2014-08-25 SURGICAL SUPPLY — 35 items
BLADE SURG 15 STRL LF DISP TIS (BLADE) ×1 IMPLANT
BLADE SURG 15 STRL SS (BLADE) ×2
BNDG CMPR 9X4 STRL LF SNTH (GAUZE/BANDAGES/DRESSINGS) ×1
BNDG COHESIVE 3X5 TAN STRL LF (GAUZE/BANDAGES/DRESSINGS) ×2 IMPLANT
BNDG ESMARK 4X9 LF (GAUZE/BANDAGES/DRESSINGS) ×2 IMPLANT
BNDG GAUZE ELAST 4 BULKY (GAUZE/BANDAGES/DRESSINGS) ×2 IMPLANT
CHLORAPREP W/TINT 26ML (MISCELLANEOUS) ×2 IMPLANT
CORDS BIPOLAR (ELECTRODE) ×2 IMPLANT
COVER BACK TABLE 60X90IN (DRAPES) ×2 IMPLANT
COVER MAYO STAND STRL (DRAPES) ×2 IMPLANT
CUFF TOURNIQUET SINGLE 18IN (TOURNIQUET CUFF) ×2 IMPLANT
DRAPE EXTREMITY T 121X128X90 (DRAPE) ×2 IMPLANT
DRAPE SURG 17X23 STRL (DRAPES) ×2 IMPLANT
DRSG PAD ABDOMINAL 8X10 ST (GAUZE/BANDAGES/DRESSINGS) ×2 IMPLANT
GAUZE SPONGE 4X4 12PLY STRL (GAUZE/BANDAGES/DRESSINGS) ×2 IMPLANT
GAUZE XEROFORM 1X8 LF (GAUZE/BANDAGES/DRESSINGS) ×2 IMPLANT
GLOVE BIOGEL PI IND STRL 7.0 (GLOVE) ×3 IMPLANT
GLOVE BIOGEL PI IND STRL 8.5 (GLOVE) ×1 IMPLANT
GLOVE BIOGEL PI INDICATOR 7.0 (GLOVE) ×3
GLOVE BIOGEL PI INDICATOR 8.5 (GLOVE) ×1
GLOVE ECLIPSE 6.5 STRL STRAW (GLOVE) ×4 IMPLANT
GLOVE SURG ORTHO 8.0 STRL STRW (GLOVE) ×2 IMPLANT
GOWN STRL REUS W/ TWL LRG LVL3 (GOWN DISPOSABLE) ×1 IMPLANT
GOWN STRL REUS W/TWL LRG LVL3 (GOWN DISPOSABLE) ×2
GOWN STRL REUS W/TWL XL LVL3 (GOWN DISPOSABLE) ×2 IMPLANT
NEEDLE PRECISIONGLIDE 27X1.5 (NEEDLE) ×2 IMPLANT
NS IRRIG 1000ML POUR BTL (IV SOLUTION) ×2 IMPLANT
PACK BASIN DAY SURGERY FS (CUSTOM PROCEDURE TRAY) ×2 IMPLANT
STOCKINETTE 4X48 STRL (DRAPES) ×2 IMPLANT
SUT ETHILON 4 0 PS 2 18 (SUTURE) ×2 IMPLANT
SUT VICRYL 4-0 PS2 18IN ABS (SUTURE) IMPLANT
SYR BULB 3OZ (MISCELLANEOUS) ×2 IMPLANT
SYR CONTROL 10ML LL (SYRINGE) ×2 IMPLANT
TOWEL OR 17X24 6PK STRL BLUE (TOWEL DISPOSABLE) ×2 IMPLANT
UNDERPAD 30X30 INCONTINENT (UNDERPADS AND DIAPERS) ×2 IMPLANT

## 2014-08-25 NOTE — Brief Op Note (Signed)
08/25/2014  3:13 PM  PATIENT:  [name]  39 y.o. female  PRE-OPERATIVE DIAGNOSIS:  RIGHT CARPAL TUNNEL SYNDEROME  POST-OPERATIVE DIAGNOSIS:  RIGHT CARPAL TUNNEL SYNDEROME  PROCEDURE:  Procedure(s): RIGHT CARPAL TUNNEL RELEASE (Right)  SURGEON:  Surgeon(s) and Role:    * Daryll Brod, MD - Primary  PHYSICIAN ASSISTANT:   ASSISTANTS: none   ANESTHESIA:   local and general  EBL:  Total I/O In: 1000 [I.V.:1000] Out: -   BLOOD ADMINISTERED:none  DRAINS: none   LOCAL MEDICATIONS USED:  BUPIVICAINE   SPECIMEN:  No Specimen  DISPOSITION OF SPECIMEN:  N/A  COUNTS:  YES  TOURNIQUET:   Total Tourniquet Time Documented: Upper Arm (Right) - 14 minutes Total: Upper Arm (Right) - 14 minutes   DICTATION: .Other Dictation: Dictation Number [id number]  PLAN OF CARE: Discharge to home after PACU  PATIENT DISPOSITION:  PACU - hemodynamically stable.

## 2014-08-25 NOTE — Transfer of Care (Signed)
Immediate Anesthesia Transfer of Care Note  Patient: [name]  Procedure(s) Performed: Procedure(s): RIGHT CARPAL TUNNEL RELEASE (Right)  Patient Location: PACU  Anesthesia Type:General  Level of Consciousness: awake and patient cooperative  Airway & Oxygen Therapy: Patient Spontanous Breathing and Patient connected to face mask oxygen  Post-op Assessment: Report given to RN and Post -op Vital signs reviewed and stable  Post vital signs: Reviewed and stable  Last Vitals:  Filed Vitals:   08/25/14 1308  BP: 105/63  Pulse: 81  Temp: 36.6 C  Resp: 16    Complications: No apparent anesthesia complications

## 2014-08-25 NOTE — Anesthesia Postprocedure Evaluation (Signed)
  Anesthesia Post-op Note  Patient: [name]  Procedure(s) Performed: Procedure(s): RIGHT CARPAL TUNNEL RELEASE (Right)  Patient Location: PACU  Anesthesia Type: General   Level of Consciousness: awake, alert  and oriented  Airway and Oxygen Therapy: Patient Spontanous Breathing  Post-op Pain: mild  Post-op Assessment: Post-op Vital signs reviewed  Post-op Vital Signs: Reviewed  Last Vitals:  Filed Vitals:   08/25/14 1611  BP: 113/76  Pulse: 88  Temp: 36.6 C  Resp: 16    Complications: No apparent anesthesia complications
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
## 2014-08-25 NOTE — Discharge Instructions (Addendum)

## 2014-08-25 NOTE — Anesthesia Procedure Notes (Signed)
Procedure Name: LMA Insertion Date/Time: 08/25/2014 2:44 PM Performed by: Deshea Pooley D Pre-anesthesia Checklist: Patient identified, Emergency Drugs available, Suction available and Patient being monitored Patient Re-evaluated:Patient Re-evaluated prior to inductionOxygen Delivery Method: Circle System Utilized Preoxygenation: Pre-oxygenation with 100% oxygen Intubation Type: IV induction Ventilation: Mask ventilation without difficulty LMA: LMA inserted LMA Size: 4.0 Number of attempts: 1 Airway Equipment and Method: Bite block Placement Confirmation: positive ETCO2 Tube secured with: Tape Dental Injury: Teeth and Oropharynx as per pre-operative assessment

## 2014-08-25 NOTE — Anesthesia Preprocedure Evaluation (Addendum)
Anesthesia Evaluation  Patient identified by MRN, date of birth, ID band Patient awake    Reviewed: Allergy & Precautions, NPO status , Patient's Chart, lab work & pertinent test results  Airway Mallampati: II  TM Distance: >3 FB Neck ROM: Full  Mouth opening: Limited Mouth Opening  Dental no notable dental hx. (+) Teeth Intact   Pulmonary Current Smoker,  breath sounds clear to auscultation  Pulmonary exam normal       Cardiovascular negative cardio ROS  Rhythm:Regular Rate:Normal     Neuro/Psych  Headaches, Anxiety Depression    GI/Hepatic negative GI ROS, Neg liver ROS,   Endo/Other  negative endocrine ROS  Renal/GU negative Renal ROS  negative genitourinary   Musculoskeletal negative musculoskeletal ROS (+)   Abdominal   Peds negative pediatric ROS (+)  Hematology negative hematology ROS (+)   Anesthesia Other Findings   Reproductive/Obstetrics negative OB ROS                            Anesthesia Physical Anesthesia Plan  ASA: II  Anesthesia Plan: General   Post-op Pain Management:    Induction: Intravenous  Airway Management Planned: LMA  Additional Equipment:   Intra-op Plan:   Post-operative Plan: Extubation in OR  Informed Consent: I have reviewed the patients History and Physical, chart, labs and discussed the procedure including the risks, benefits and alternatives for the proposed anesthesia with the patient or authorized representative who has indicated his/her understanding and acceptance.   Dental advisory given  Plan Discussed with: CRNA and Surgeon  Anesthesia Plan Comments:         Anesthesia Quick Evaluation

## 2014-08-25 NOTE — Op Note (Signed)
Dictation Number [id number]

## 2014-08-26 ENCOUNTER — Encounter (HOSPITAL_BASED_OUTPATIENT_CLINIC_OR_DEPARTMENT_OTHER): Payer: Self-pay | Admitting: Orthopedic Surgery

## 2014-08-26 ENCOUNTER — Other Ambulatory Visit: Payer: Self-pay | Admitting: Physician Assistant

## 2014-08-26 ENCOUNTER — Other Ambulatory Visit: Payer: Self-pay | Admitting: Neurological Surgery

## 2014-08-26 DIAGNOSIS — M5012 Mid-cervical disc disorder, unspecified level: Secondary | ICD-10-CM

## 2014-08-26 NOTE — Op Note (Signed)
[name], [name]      ACCOUNT NO.:  [id number]  MEDICAL RECORD NO.:  [id number]  LOCATION:                                 FACILITY:  PHYSICIAN:  Daryll Brod, M.D.       DATE OF BIRTH:  05/27/1975  DATE OF PROCEDURE:  08/25/2014 DATE OF DISCHARGE:                              OPERATIVE REPORT   PREOPERATIVE DIAGNOSIS:  Carpal tunnel syndrome, right hand.  POSTOPERATIVE DIAGNOSIS:  Carpal tunnel syndrome, right hand.  OPERATION:  Decompression, right median nerve.  SURGEON:  Daryll Brod, MD  ANESTHESIA:  General with local infiltration.  ANESTHESIOLOGIST:  Ala Dach, MD  HISTORY:  The patient is a 39 year old female with a history of bilateral carpal tunnel syndrome.  She has undergone release of her left and is admitted now for release to the right side.  Pre, peri, and postoperative course have been discussed along with risks and complications.  She is aware that there is no guarantee with surgery, possibility of infection, recurrence of injury to arteries, nerves, tendons, incomplete relief of symptoms, and dystrophy.  She has had nerve conductions done which are positive and has failed conservative treatment.  PROCEDURE IN DETAIL:  The patient was brought to the operating room, where general anesthetic was carried out without difficulty.  She was prepped using ChloraPrep, supine position with the right arm free. Antibiotics have been given in the preoperative area.  The patient was prepped and draped in usual fashion supine position with the right arm free.  A 3-minute dry time was allowed.  After a prep with ChloraPrep, time-out taken, confirming the patient and procedure.  The limb was exsanguinated with an Esmarch bandage.  Tourniquet placed high on the arm was inflated to 250 mmHg.  A longitudinal incision was made in the right palm, carried down through subcutaneous tissue.  Bleeders were electrocauterized.  Palmar fascia was split.  Superficial  palmar arch identified.  The flexor tendon to the ring and little finger identified. To the ulnar side of the median nerve, the carpal retinaculum was incised with sharp dissection.  Right angle and Sewall retractor were placed between skin and forearm fascia.  The fascia was released for approximately 2 cm proximal to the wrist crease under direct vision. The canal was explored.  Air compression to the nerve was apparent. Motor branch entered in the muscle.  No further lesions were identified. The wound was irrigated and closed with interrupted 4-0 nylon sutures. Local infiltration with 0.25% bupivacaine without epinephrine was given, approximately 10 mL was used.  Sterile compressive dressing with the fingers free was applied.  On deflation of the tourniquet, all fingers immediately pinked.  She was taken to the recovery room for observation in satisfactory condition.  She will be discharged home to return to the [facility] in 1 week on oxycodone.          ______________________________ Daryll Brod, M.D.     GK/MEDQ  D:  08/25/2014  T:  08/26/2014  Job:  [id number]

## 2014-08-28 ENCOUNTER — Ambulatory Visit (INDEPENDENT_AMBULATORY_CARE_PROVIDER_SITE_OTHER): Payer: 59 | Admitting: Sports Medicine

## 2014-08-28 ENCOUNTER — Ambulatory Visit (INDEPENDENT_AMBULATORY_CARE_PROVIDER_SITE_OTHER): Payer: 59 | Admitting: Psychiatry

## 2014-08-28 ENCOUNTER — Encounter (HOSPITAL_COMMUNITY): Payer: Self-pay | Admitting: Psychiatry

## 2014-08-28 ENCOUNTER — Encounter: Payer: Self-pay | Admitting: Sports Medicine

## 2014-08-28 VITALS — BP 101/65 | HR 69 | Ht 63.0 in | Wt 144.0 lb

## 2014-08-28 VITALS — BP 101/66 | HR 80 | Ht 63.0 in | Wt 144.0 lb

## 2014-08-28 DIAGNOSIS — F411 Generalized anxiety disorder: Secondary | ICD-10-CM | POA: Diagnosis not present

## 2014-08-28 DIAGNOSIS — F4310 Post-traumatic stress disorder, unspecified: Secondary | ICD-10-CM | POA: Diagnosis not present

## 2014-08-28 DIAGNOSIS — F0630 Mood disorder due to known physiological condition, unspecified: Secondary | ICD-10-CM | POA: Diagnosis not present

## 2014-08-28 DIAGNOSIS — M5030 Other cervical disc degeneration, unspecified cervical region: Secondary | ICD-10-CM | POA: Diagnosis not present

## 2014-08-28 DIAGNOSIS — G5602 Carpal tunnel syndrome, left upper limb: Secondary | ICD-10-CM

## 2014-08-28 MED ORDER — PREDNISONE 50 MG PO TABS: ORAL_TABLET | ORAL | Status: DC

## 2014-08-28 MED ORDER — CLONAZEPAM 1 MG PO TABS: ORAL_TABLET | ORAL | Status: DC

## 2014-08-28 MED ORDER — PREGABALIN 150 MG PO CAPS: 150.0000 mg | ORAL_CAPSULE | Freq: Two times a day (BID) | ORAL | Status: DC
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
## 2014-08-28 NOTE — Assessment & Plan Note (Signed)
Had a carpal tunnel release on the right. We have rewrapped this as it did become dirty.

## 2014-09-01 ENCOUNTER — Ambulatory Visit: Payer: Self-pay | Admitting: Physician Assistant

## 2014-09-02 ENCOUNTER — Other Ambulatory Visit: Payer: Self-pay

## 2014-09-03 ENCOUNTER — Other Ambulatory Visit: Payer: Self-pay

## 2014-09-03 ENCOUNTER — Inpatient Hospital Stay
Admission: RE | Admit: 2014-09-03 | Discharge: 2014-09-03 | Disposition: A | Discharge status: Home / Self Care | Payer: Self-pay | Source: Ambulatory Visit | Attending: Neurological Surgery | Admitting: Neurological Surgery

## 2014-09-03 NOTE — Discharge Instructions (Signed)
Myelogram Discharge Instructions  1. Go home and rest quietly for the next 24 hours.  It is important to lie flat for the next 24 hours.  Get up only to go to the restroom.  You may lie in the bed or on a couch on your back, your stomach, your left side or your right side.  You may have one pillow under your head.  You may have pillows between your knees while you are on your side or under your knees while you are on your back.  2. DO NOT drive today.  Recline the seat as far back as it will go, while still wearing your seat belt, on the way home.  3. You may get up to go to the bathroom as needed.  You may sit up for 10 minutes to eat.  You may resume your normal diet and medications unless otherwise indicated.  Drink lots of extra fluids today and tomorrow.  4. The incidence of headache, nausea, or vomiting is about 5% (one in 20 patients).  If you develop a headache, lie flat and drink plenty of fluids until the headache goes away.  Caffeinated beverages may be helpful.  If you develop severe nausea and vomiting or a headache that does not go away with flat bed rest, call [phone].  5. You may resume normal activities after your 24 hours of bed rest is over; however, do not exert yourself strongly or do any heavy lifting tomorrow. If when you get up you have a headache when standing, go back to bed and force fluids for another 24 hours.  6. Call your physician for a follow-up appointment.  The results of your myelogram will be sent directly to your physician by the following day.  7. If you have any questions or if complications develop after you arrive home, please call [phone].  Discharge instructions have been explained to the patient.  The patient, or the person responsible for the patient, fully understands these instructions.       May resume Phenergan and Cymbalta on Sep 04, 2014, after 11:00 am.

## 2014-09-04 ENCOUNTER — Ambulatory Visit
Admission: RE | Admit: 2014-09-04 | Discharge: 2014-09-04 | Disposition: A | Discharge status: Home / Self Care | Payer: 59 | Source: Ambulatory Visit | Attending: Neurological Surgery | Admitting: Neurological Surgery

## 2014-09-04 ENCOUNTER — Other Ambulatory Visit: Payer: Self-pay

## 2014-09-04 ENCOUNTER — Other Ambulatory Visit: Payer: Self-pay | Admitting: Neurological Surgery

## 2014-09-04 ENCOUNTER — Ambulatory Visit (HOSPITAL_COMMUNITY): Payer: Self-pay | Admitting: Psychiatry

## 2014-09-04 ENCOUNTER — Telehealth: Payer: Self-pay

## 2014-09-04 ENCOUNTER — Inpatient Hospital Stay
Admission: RE | Admit: 2014-09-04 | Discharge: 2014-09-04 | Disposition: A | Discharge status: Home / Self Care | Payer: Self-pay | Source: Ambulatory Visit | Attending: Neurological Surgery | Admitting: Neurological Surgery

## 2014-09-04 DIAGNOSIS — M5012 Mid-cervical disc disorder, unspecified level: Secondary | ICD-10-CM

## 2014-09-04 DIAGNOSIS — M7918 Myalgia, other site: Secondary | ICD-10-CM

## 2014-09-04 MED ORDER — IOHEXOL 300 MG/ML  SOLN
10.0000 mL | Freq: Once | INTRAMUSCULAR | Status: AC | PRN
  Administered 2014-09-04: 10 mL via INTRATHECAL

## 2014-09-04 MED ORDER — MEPERIDINE HCL 100 MG/ML IJ SOLN
75.0000 mg | Freq: Once | INTRAMUSCULAR | Status: AC
  Administered 2014-09-04: 75 mg via INTRAMUSCULAR

## 2014-09-04 MED ORDER — DIAZEPAM 5 MG PO TABS
5.0000 mg | ORAL_TABLET | Freq: Once | ORAL | Status: AC
  Administered 2014-09-04: 5 mg via ORAL

## 2014-09-04 MED ORDER — ONDANSETRON HCL 4 MG/2ML IJ SOLN
4.0000 mg | Freq: Once | INTRAMUSCULAR | Status: AC
  Administered 2014-09-04: 4 mg via INTRAMUSCULAR

## 2014-09-04 NOTE — Discharge Instructions (Signed)
Myelogram Discharge Instructions  1. Go home and rest quietly for the next 24 hours.  It is important to lie flat for the next 24 hours.  Get up only to go to the restroom.  You may lie in the bed or on a couch on your back, your stomach, your left side or your right side.  You may have one pillow under your head.  You may have pillows between your knees while you are on your side or under your knees while you are on your back.  2. DO NOT drive today.  Recline the seat as far back as it will go, while still wearing your seat belt, on the way home.  3. You may get up to go to the bathroom as needed.  You may sit up for 10 minutes to eat.  You may resume your normal diet and medications unless otherwise indicated.  Drink lots of extra fluids today and tomorrow.  4. The incidence of headache, nausea, or vomiting is about 5% (one in 20 patients).  If you develop a headache, lie flat and drink plenty of fluids until the headache goes away.  Caffeinated beverages may be helpful.  If you develop severe nausea and vomiting or a headache that does not go away with flat bed rest, call [phone].  5. You may resume normal activities after your 24 hours of bed rest is over; however, do not exert yourself strongly or do any heavy lifting tomorrow. If when you get up you have a headache when standing, go back to bed and force fluids for another 24 hours.  6. Call your physician for a follow-up appointment.  The results of your myelogram will be sent directly to your physician by the following day.  7. If you have any questions or if complications develop after you arrive home, please call [phone].  Discharge instructions have been explained to the patient.  The patient, or the person responsible for the patient, fully understands these instructions.       May resume Cymbalta and Phenergan on Sep 04, 2013, after 1:30 pm.

## 2014-09-04 NOTE — Telephone Encounter (Signed)
Hey did you get a chance to call him. I don't mind if wants to here from me but just curious.

## 2014-09-04 NOTE — Telephone Encounter (Signed)
Please call pt and find out if taking lyrica and the dose and sig.

## 2014-09-04 NOTE — Telephone Encounter (Signed)
Dr Alroy Dust is an orthopedic surgeon that is reviewing the chart of [name]. He would like a courtesy call to find out if [name] has any opinion about her work abilities. He is just needs a yes about an opinion or no opinion.    [phone] - Please call early morning.

## 2014-09-05 ENCOUNTER — Telehealth: Payer: Self-pay

## 2014-09-05 NOTE — Telephone Encounter (Signed)
Before we do that, what is planned with [place] [facility]? She did just have a CT myelogram.

## 2014-09-05 NOTE — Telephone Encounter (Signed)
Discussed with independent medical reviewer, due to 2 level ACDF she will have some degree of permanent restrictions, light level workload however this will not then her from doing her current job. She is unlikely to get any degree of full long-term disability. Symptoms continue to be predominantly myofascial, and there is likely an element of secondary gain/malingering. I would be very cautious with prescriptions of narcotics and other controlled substances to this patient, and focused predominantly on treatment of her myofascial and neuropathic type symptoms.

## 2014-09-05 NOTE — Telephone Encounter (Signed)
If pt is still taking ok for refills for next 6 months. . If not then please take off medication list.

## 2014-09-05 NOTE — Telephone Encounter (Signed)
Patient called requested an order for [facility] for her neck. Kortnee Bas,CMA

## 2014-09-08 ENCOUNTER — Other Ambulatory Visit: Payer: Self-pay

## 2014-09-08 NOTE — Telephone Encounter (Signed)
Patient has been informed. Sarajane Fambrough,CMA  

## 2014-09-15 MED ORDER — PREGABALIN 150 MG PO CAPS: 150.0000 mg | ORAL_CAPSULE | Freq: Two times a day (BID) | ORAL | Status: DC

## 2014-09-16 ENCOUNTER — Ambulatory Visit (INDEPENDENT_AMBULATORY_CARE_PROVIDER_SITE_OTHER): Payer: 59 | Admitting: Sports Medicine

## 2014-09-16 ENCOUNTER — Encounter: Payer: Self-pay | Admitting: Sports Medicine

## 2014-09-16 VITALS — BP 104/75 | HR 99 | Temp 98.0°F | Ht 63.0 in | Wt 138.0 lb

## 2014-09-16 DIAGNOSIS — M5030 Other cervical disc degeneration, unspecified cervical region: Secondary | ICD-10-CM

## 2014-09-16 MED ORDER — TRAMADOL HCL 50 MG PO TABS: ORAL_TABLET | ORAL | Status: DC
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
## 2014-09-16 NOTE — Assessment & Plan Note (Addendum)
Post C5-C7 ACDF, recent cervical myelogram shows intact fusion with no foraminal stenosis, there is a bit of adjacent level disease at the C4-C5 level that is unlikely responsible for her symptoms. At this point she is cleared from a spine standpoint, I am going to discontinue Lyrica and Robaxin, we are going to simply use tramadol for pain. Return as needed.

## 2014-09-25 ENCOUNTER — Ambulatory Visit: Payer: Self-pay | Admitting: Sports Medicine

## 2014-10-01 ENCOUNTER — Ambulatory Visit (HOSPITAL_COMMUNITY): Payer: Self-pay | Admitting: Psychiatry

## 2014-10-07 ENCOUNTER — Ambulatory Visit (INDEPENDENT_AMBULATORY_CARE_PROVIDER_SITE_OTHER): Payer: 59 | Admitting: Psychiatry

## 2014-10-07 ENCOUNTER — Other Ambulatory Visit: Payer: Self-pay | Admitting: Neurological Surgery

## 2014-10-07 ENCOUNTER — Encounter (HOSPITAL_COMMUNITY): Payer: Self-pay | Admitting: Psychiatry

## 2014-10-07 VITALS — BP 126/64 | HR 75 | Ht 63.0 in | Wt 137.0 lb

## 2014-10-07 DIAGNOSIS — F4310 Post-traumatic stress disorder, unspecified: Secondary | ICD-10-CM | POA: Diagnosis not present

## 2014-10-07 DIAGNOSIS — F39 Unspecified mood [affective] disorder: Secondary | ICD-10-CM

## 2014-10-07 DIAGNOSIS — F411 Generalized anxiety disorder: Secondary | ICD-10-CM | POA: Diagnosis not present

## 2014-10-07 DIAGNOSIS — M7918 Myalgia, other site: Secondary | ICD-10-CM

## 2014-10-07 DIAGNOSIS — F0630 Mood disorder due to known physiological condition, unspecified: Secondary | ICD-10-CM
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
## 2014-10-10 ENCOUNTER — Other Ambulatory Visit: Payer: Self-pay | Admitting: Sports Medicine

## 2014-10-20 ENCOUNTER — Encounter (HOSPITAL_COMMUNITY): Payer: Self-pay

## 2014-10-20 ENCOUNTER — Encounter (HOSPITAL_COMMUNITY)
Admission: RE | Admit: 2014-10-20 | Discharge: 2014-10-20 | Disposition: A | Discharge status: Home / Self Care | Payer: 59 | Source: Ambulatory Visit | Attending: Neurological Surgery | Admitting: Neurological Surgery

## 2014-10-20 DIAGNOSIS — Z01818 Encounter for other preprocedural examination: Secondary | ICD-10-CM | POA: Insufficient documentation

## 2014-10-20 DIAGNOSIS — Z01812 Encounter for preprocedural laboratory examination: Secondary | ICD-10-CM | POA: Insufficient documentation

## 2014-10-20 DIAGNOSIS — Z981 Arthrodesis status: Secondary | ICD-10-CM | POA: Insufficient documentation

## 2014-10-20 DIAGNOSIS — S129XXA Fracture of neck, unspecified, initial encounter: Secondary | ICD-10-CM

## 2014-10-20 DIAGNOSIS — I10 Essential (primary) hypertension: Secondary | ICD-10-CM | POA: Diagnosis not present

## 2014-10-20 HISTORY — DX: Fibromyalgia: M79.7

## 2014-10-20 HISTORY — DX: Reserved for inherently not codable concepts without codable children: IMO0001

## 2014-10-20 HISTORY — DX: Essential (primary) hypertension: I10

## 2014-10-20 HISTORY — DX: Unspecified osteoarthritis, unspecified site: M19.90

## 2014-10-20 LAB — PROTIME-INR
INR: 0.99 (ref 0.00–1.49)
PROTHROMBIN TIME: 13.3 s (ref 11.6–15.2)

## 2014-10-20 LAB — CBC WITH DIFFERENTIAL/PLATELET
Basophils Absolute: 0 10*3/uL (ref 0.0–0.1)
Basophils Relative: 0 % (ref 0–1)
Eosinophils Absolute: 0.2 10*3/uL (ref 0.0–0.7)
Eosinophils Relative: 3 % (ref 0–5)
HCT: 44.8 % (ref 36.0–46.0)
Hemoglobin: 15.4 g/dL — ABNORMAL HIGH (ref 12.0–15.0)
LYMPHS ABS: 2.4 10*3/uL (ref 0.7–4.0)
Lymphocytes Relative: 26 % (ref 12–46)
MCH: 32.4 pg (ref 26.0–34.0)
MCHC: 34.4 g/dL (ref 30.0–36.0)
MCV: 94.3 fL (ref 78.0–100.0)
MONOS PCT: 10 % (ref 3–12)
Monocytes Absolute: 0.9 10*3/uL (ref 0.1–1.0)
NEUTROS ABS: 5.5 10*3/uL (ref 1.7–7.7)
NEUTROS PCT: 61 % (ref 43–77)
Platelets: 325 10*3/uL (ref 150–400)
RBC: 4.75 MIL/uL (ref 3.87–5.11)
RDW: 13.5 % (ref 11.5–15.5)
WBC: 9 10*3/uL (ref 4.0–10.5)

## 2014-10-20 LAB — BASIC METABOLIC PANEL
ANION GAP: 10 (ref 5–15)
BUN: 19 mg/dL (ref 6–20)
CO2: 22 mmol/L (ref 22–32)
Calcium: 9.5 mg/dL (ref 8.9–10.3)
Chloride: 104 mmol/L (ref 101–111)
Creatinine, Ser: 0.82 mg/dL (ref 0.44–1.00)
Glucose, Bld: 104 mg/dL — ABNORMAL HIGH (ref 65–99)
Potassium: 3.9 mmol/L (ref 3.5–5.1)
SODIUM: 136 mmol/L (ref 135–145)

## 2014-10-20 LAB — SURGICAL PCR SCREEN
MRSA, PCR: NEGATIVE
Staphylococcus aureus: NEGATIVE

## 2014-10-20 LAB — HCG, SERUM, QUALITATIVE: Preg, Serum: NEGATIVE

## 2014-10-20 NOTE — Pre-Procedure Instructions (Signed)
[name]  10/20/2014      [facility] [place] [address] [place] [address] Phone: [phone] Fax: [phone]    Your procedure is scheduled on June 24  Report to [facility] at 1115 A.M.  Call this number if you have problems the morning of surgery:  [phone]   Remember:  Do not eat food or drink liquids after midnight.  Take these medicines the morning of surgery with A SIP OF WATER albuterol if needed, Clonazepam (Klonopin), Duloxetine (Cymbalta), Metoprolol Succinate(Torol-XL)  Stop taking Aspirin, Aleve,  Ibuprofen, BC's, Goody's, Herbal medications, Meloxicam (Mobic), Fish Oil   Do not wear jewelry, make-up or nail polish.  Do not wear lotions, powders, or perfumes.  You may wear deodorant.  Do not shave 48 hours prior to surgery.  Men may shave face and neck.  Do not bring valuables to the hospital.  [facility] is not responsible for any belongings or valuables.  Contacts, dentures or bridgework may not be worn into surgery.  Leave your suitcase in the car.  After surgery it may be brought to your room.  For patients admitted to the hospital, discharge time will be determined by your treatment team.  Patients discharged the day of surgery will not be allowed to drive home.    Special instructions:  [facility] - Preparing for Surgery  Before surgery, you can play an important role.  Because skin is not sterile, your skin needs to be as free of germs as possible.  You can reduce the number of germs on you skin by washing with CHG (chlorahexidine gluconate) soap before surgery.  CHG is an antiseptic cleaner which kills germs and bonds with the skin to continue killing germs even after washing.  Please DO NOT use if you have an allergy to CHG or antibacterial soaps.  If your skin becomes reddened/irritated stop using the CHG and inform your  nurse when you arrive at Short Stay.  Do not shave (including legs and underarms) for at least 48 hours prior to the first CHG shower.  You may shave your face.  Please follow these instructions carefully:   1.  Shower with CHG Soap the night before surgery and the     morning of Surgery.  2.  If you choose to wash your hair, wash your hair first as usual with your  normal shampoo.  3.  After you shampoo, rinse your hair and body thoroughly to remove the   Shampoo.  4.  Use CHG as you would any other liquid soap.  You can apply chg directly  to the skin and wash gently with scrungie or a clean washcloth.  5.  Apply the CHG Soap to your body ONLY FROM THE NECK DOWN.    Do not use on open wounds or open sores.  Avoid contact with your eyes,  ears, mouth and genitals (private parts).  Wash genitals (private parts) with your normal soap.  6.  Wash thoroughly, paying special attention to the area where your surgery will be performed.  7.  Thoroughly rinse your body with warm water from the neck down.  8.  DO NOT shower/wash with your normal soap after using and rinsing off the CHG Soap.  9.  Pat yourself dry with a clean towel.            10.  Wear  clean pajamas.            11.  Place clean sheets on your bed the night of your first shower and do not  sleep with pets.  Day of Surgery  Do not apply any lotions/deoderants the morning of surgery.  Please wear clean clothes to the hospital/surgery center.     Please read over the following fact sheets that you were given. Pain Booklet, Coughing and Deep Breathing, MRSA Information and Surgical Site Infection Prevention

## 2014-10-20 NOTE — Progress Notes (Signed)
PCP is [facility] Planas, [place] Denies seeing a cardiologist. Denies ever having a card cath, Stress test, or echo

## 2014-10-27 MED ORDER — CEFAZOLIN SODIUM-DEXTROSE 2-3 GM-% IV SOLR
2.0000 g | INTRAVENOUS | Status: AC
  Administered 2014-10-28: 2 g via INTRAVENOUS
  Filled 2014-10-27: qty 50

## 2014-10-27 MED ORDER — DEXAMETHASONE SODIUM PHOSPHATE 10 MG/ML IJ SOLN
10.0000 mg | INTRAMUSCULAR | Status: AC
  Administered 2014-10-28: 10 mg via INTRAVENOUS
  Filled 2014-10-27: qty 1

## 2014-10-27 NOTE — Progress Notes (Signed)
I spoke with patient an confirmed surgery date 10/27/16 and arrival time 10:30

## 2014-10-28 ENCOUNTER — Ambulatory Visit (HOSPITAL_COMMUNITY)
Admission: RE | Admit: 2014-10-28 | Discharge: 2014-10-29 | Disposition: A | Discharge status: Home / Self Care | Payer: 59 | Source: Ambulatory Visit | Attending: Neurological Surgery | Admitting: Neurological Surgery

## 2014-10-28 ENCOUNTER — Encounter (HOSPITAL_COMMUNITY)
Admission: RE | Disposition: A | Discharge status: Home / Self Care | Payer: Self-pay | Source: Ambulatory Visit | Attending: Neurological Surgery

## 2014-10-28 ENCOUNTER — Inpatient Hospital Stay (HOSPITAL_COMMUNITY): Payer: 59

## 2014-10-28 ENCOUNTER — Inpatient Hospital Stay (HOSPITAL_COMMUNITY): Payer: 59 | Admitting: Anesthesiology

## 2014-10-28 ENCOUNTER — Encounter (HOSPITAL_COMMUNITY): Payer: Self-pay

## 2014-10-28 DIAGNOSIS — I10 Essential (primary) hypertension: Secondary | ICD-10-CM | POA: Insufficient documentation

## 2014-10-28 DIAGNOSIS — F17210 Nicotine dependence, cigarettes, uncomplicated: Secondary | ICD-10-CM | POA: Insufficient documentation

## 2014-10-28 DIAGNOSIS — M960 Pseudarthrosis after fusion or arthrodesis: Secondary | ICD-10-CM | POA: Diagnosis not present

## 2014-10-28 DIAGNOSIS — M797 Fibromyalgia: Secondary | ICD-10-CM | POA: Insufficient documentation

## 2014-10-28 DIAGNOSIS — M5022 Other cervical disc displacement, mid-cervical region: Secondary | ICD-10-CM | POA: Insufficient documentation

## 2014-10-28 DIAGNOSIS — Z419 Encounter for procedure for purposes other than remedying health state, unspecified: Secondary | ICD-10-CM

## 2014-10-28 DIAGNOSIS — Z981 Arthrodesis status: Secondary | ICD-10-CM

## 2014-10-28 HISTORY — DX: Other complications of anesthesia, initial encounter: T88.59XA

## 2014-10-28 HISTORY — DX: Other specified postprocedural states: Z98.890

## 2014-10-28 HISTORY — DX: Adverse effect of unspecified anesthetic, initial encounter: T41.45XA

## 2014-10-28 HISTORY — DX: Nausea with vomiting, unspecified: R11.2

## 2014-10-28 HISTORY — PX: POSTERIOR CERVICAL FUSION/FORAMINOTOMY: SHX5038

## 2014-10-28 SURGERY — POSTERIOR CERVICAL FUSION/FORAMINOTOMY LEVEL 3
Anesthesia: General | Site: Spine Cervical

## 2014-10-28 MED ORDER — ONDANSETRON HCL 4 MG/2ML IJ SOLN
INTRAMUSCULAR | Status: DC | PRN
  Administered 2014-10-28: 4 mg via INTRAVENOUS

## 2014-10-28 MED ORDER — PHENYLEPHRINE HCL 10 MG/ML IJ SOLN
INTRAMUSCULAR | Status: DC | PRN
  Administered 2014-10-28: 80 ug via INTRAVENOUS

## 2014-10-28 MED ORDER — CEFAZOLIN SODIUM 1-5 GM-% IV SOLN
1.0000 g | Freq: Three times a day (TID) | INTRAVENOUS | Status: AC
  Administered 2014-10-28 – 2014-10-29 (×2): 1 g via INTRAVENOUS
  Filled 2014-10-28 (×2): qty 50

## 2014-10-28 MED ORDER — GLYCOPYRROLATE 0.2 MG/ML IJ SOLN
INTRAMUSCULAR | Status: AC
  Filled 2014-10-28: qty 2

## 2014-10-28 MED ORDER — SODIUM CHLORIDE 0.9 % IJ SOLN: 3.0000 mL | INTRAMUSCULAR | Status: DC | PRN

## 2014-10-28 MED ORDER — VECURONIUM BROMIDE 10 MG IV SOLR
INTRAVENOUS | Status: AC
  Filled 2014-10-28: qty 10

## 2014-10-28 MED ORDER — PROMETHAZINE HCL 25 MG/ML IJ SOLN
6.2500 mg | INTRAMUSCULAR | Status: DC | PRN
Start: 2014-10-28 — End: 2014-10-28

## 2014-10-28 MED ORDER — PROPOFOL 10 MG/ML IV BOLUS
INTRAVENOUS | Status: DC | PRN
  Administered 2014-10-28: 10 mg via INTRAVENOUS

## 2014-10-28 MED ORDER — LIDOCAINE HCL (CARDIAC) 20 MG/ML IV SOLN
INTRAVENOUS | Status: AC
  Filled 2014-10-28: qty 5

## 2014-10-28 MED ORDER — LACTATED RINGERS IV SOLN
INTRAVENOUS | Status: DC
  Administered 2014-10-28 (×2): via INTRAVENOUS

## 2014-10-28 MED ORDER — METHOCARBAMOL 500 MG PO TABS
500.0000 mg | ORAL_TABLET | Freq: Four times a day (QID) | ORAL | Status: DC | PRN
  Administered 2014-10-28: 500 mg via ORAL
  Filled 2014-10-28 (×2): qty 1

## 2014-10-28 MED ORDER — STERILE WATER FOR INJECTION IJ SOLN
INTRAMUSCULAR | Status: AC
  Filled 2014-10-28: qty 10

## 2014-10-28 MED ORDER — ONDANSETRON HCL 4 MG/2ML IJ SOLN
INTRAMUSCULAR | Status: AC
  Filled 2014-10-28: qty 2

## 2014-10-28 MED ORDER — PHENOL 1.4 % MT LIQD: 1.0000 | OROMUCOSAL | Status: DC | PRN

## 2014-10-28 MED ORDER — METHOCARBAMOL 500 MG PO TABS
ORAL_TABLET | ORAL | Status: AC
  Filled 2014-10-28: qty 1

## 2014-10-28 MED ORDER — CLONAZEPAM 0.5 MG PO TABS
1.0000 mg | ORAL_TABLET | Freq: Two times a day (BID) | ORAL | Status: DC
  Administered 2014-10-28: 1 mg via ORAL
  Filled 2014-10-28: qty 2

## 2014-10-28 MED ORDER — GLYCOPYRROLATE 0.2 MG/ML IJ SOLN
INTRAMUSCULAR | Status: DC | PRN
  Administered 2014-10-28: 0.4 mg via INTRAVENOUS

## 2014-10-28 MED ORDER — OXYCODONE-ACETAMINOPHEN 5-325 MG PO TABS
ORAL_TABLET | ORAL | Status: AC
  Filled 2014-10-28: qty 2

## 2014-10-28 MED ORDER — ALBUTEROL SULFATE (2.5 MG/3ML) 0.083% IN NEBU: 3.0000 mL | INHALATION_SOLUTION | RESPIRATORY_TRACT | Status: DC | PRN

## 2014-10-28 MED ORDER — PROPOFOL 10 MG/ML IV BOLUS
INTRAVENOUS | Status: AC
  Filled 2014-10-28: qty 20

## 2014-10-28 MED ORDER — DEXMEDETOMIDINE BOLUS VIA INFUSION
INTRAVENOUS | Status: DC | PRN
  Administered 2014-10-28: 10 ug via INTRAVENOUS

## 2014-10-28 MED ORDER — NEOSTIGMINE METHYLSULFATE 10 MG/10ML IV SOLN
INTRAVENOUS | Status: AC
  Filled 2014-10-28: qty 1

## 2014-10-28 MED ORDER — DIAZEPAM 5 MG PO TABS
5.0000 mg | ORAL_TABLET | Freq: Four times a day (QID) | ORAL | Status: DC | PRN
  Administered 2014-10-28 – 2014-10-29 (×3): 5 mg via ORAL
  Filled 2014-10-28 (×3): qty 1

## 2014-10-28 MED ORDER — OXYCODONE-ACETAMINOPHEN 5-325 MG PO TABS
1.0000 | ORAL_TABLET | ORAL | Status: DC | PRN
  Administered 2014-10-28 – 2014-10-29 (×3): 2 via ORAL
  Filled 2014-10-28 (×3): qty 2

## 2014-10-28 MED ORDER — MIDAZOLAM HCL 2 MG/2ML IJ SOLN
INTRAMUSCULAR | Status: DC | PRN
  Administered 2014-10-28: 2 mg via INTRAVENOUS

## 2014-10-28 MED ORDER — HYDROMORPHONE HCL 1 MG/ML IJ SOLN
0.5000 mg | INTRAMUSCULAR | Status: DC | PRN
  Administered 2014-10-28 – 2014-10-29 (×4): 1 mg via INTRAVENOUS
  Filled 2014-10-28 (×4): qty 1

## 2014-10-28 MED ORDER — FENTANYL CITRATE (PF) 100 MCG/2ML IJ SOLN
INTRAMUSCULAR | Status: AC
  Filled 2014-10-28: qty 2

## 2014-10-28 MED ORDER — SUFENTANIL CITRATE 50 MCG/ML IV SOLN
INTRAVENOUS | Status: DC | PRN
  Administered 2014-10-28 (×5): 10 ug via INTRAVENOUS

## 2014-10-28 MED ORDER — DULOXETINE HCL 60 MG PO CPEP
60.0000 mg | ORAL_CAPSULE | Freq: Every day | ORAL | Status: DC
  Filled 2014-10-28: qty 1

## 2014-10-28 MED ORDER — MEPERIDINE HCL 25 MG/ML IJ SOLN: 6.2500 mg | INTRAMUSCULAR | Status: DC | PRN

## 2014-10-28 MED ORDER — POTASSIUM CHLORIDE IN NACL 20-0.9 MEQ/L-% IV SOLN
INTRAVENOUS | Status: DC
  Filled 2014-10-28 (×3): qty 1000

## 2014-10-28 MED ORDER — SUFENTANIL CITRATE 50 MCG/ML IV SOLN
INTRAVENOUS | Status: AC
  Filled 2014-10-28: qty 1

## 2014-10-28 MED ORDER — BUPIVACAINE HCL (PF) 0.25 % IJ SOLN
INTRAMUSCULAR | Status: DC | PRN
  Administered 2014-10-28: 5 mL

## 2014-10-28 MED ORDER — ONDANSETRON HCL 4 MG/2ML IJ SOLN: 4.0000 mg | INTRAMUSCULAR | Status: DC | PRN

## 2014-10-28 MED ORDER — 0.9 % SODIUM CHLORIDE (POUR BTL) OPTIME
TOPICAL | Status: DC | PRN
  Administered 2014-10-28: 1000 mL

## 2014-10-28 MED ORDER — ACETAMINOPHEN 325 MG PO TABS: 650.0000 mg | ORAL_TABLET | ORAL | Status: DC | PRN

## 2014-10-28 MED ORDER — SODIUM CHLORIDE 0.9 % IR SOLN
Status: DC | PRN
  Administered 2014-10-28: 500 mL

## 2014-10-28 MED ORDER — NEOSTIGMINE METHYLSULFATE 10 MG/10ML IV SOLN
INTRAVENOUS | Status: DC | PRN
Start: 2014-10-28 — End: 2014-10-28
  Administered 2014-10-28: 3 mg via INTRAVENOUS

## 2014-10-28 MED ORDER — SODIUM CHLORIDE 0.9 % IJ SOLN: 3.0000 mL | Freq: Two times a day (BID) | INTRAMUSCULAR | Status: DC

## 2014-10-28 MED ORDER — VECURONIUM BROMIDE 10 MG IV SOLR
INTRAVENOUS | Status: DC | PRN
  Administered 2014-10-28: 5 mg via INTRAVENOUS
  Administered 2014-10-28 (×2): 1 mg via INTRAVENOUS

## 2014-10-28 MED ORDER — THROMBIN 5000 UNITS EX SOLR
OROMUCOSAL | Status: DC | PRN
  Administered 2014-10-28: 5 mL via TOPICAL

## 2014-10-28 MED ORDER — LIDOCAINE HCL (CARDIAC) 20 MG/ML IV SOLN
INTRAVENOUS | Status: DC | PRN
  Administered 2014-10-28: 100 mg via INTRAVENOUS

## 2014-10-28 MED ORDER — ACETAMINOPHEN 650 MG RE SUPP: 650.0000 mg | RECTAL | Status: DC | PRN

## 2014-10-28 MED ORDER — MENTHOL 3 MG MT LOZG
1.0000 | LOZENGE | OROMUCOSAL | Status: DC | PRN
Start: 2014-10-28 — End: 2014-10-29

## 2014-10-28 MED ORDER — METHOCARBAMOL 1000 MG/10ML IJ SOLN
500.0000 mg | Freq: Four times a day (QID) | INTRAMUSCULAR | Status: DC | PRN
  Filled 2014-10-28: qty 5

## 2014-10-28 MED ORDER — PHENYLEPHRINE 40 MCG/ML (10ML) SYRINGE FOR IV PUSH (FOR BLOOD PRESSURE SUPPORT)
PREFILLED_SYRINGE | INTRAVENOUS | Status: AC
  Filled 2014-10-28: qty 10

## 2014-10-28 MED ORDER — FENTANYL CITRATE (PF) 100 MCG/2ML IJ SOLN
25.0000 ug | INTRAMUSCULAR | Status: DC | PRN
  Administered 2014-10-28 (×3): 50 ug via INTRAVENOUS

## 2014-10-28 MED ORDER — SODIUM CHLORIDE 0.9 % IV SOLN: 250.0000 mL | INTRAVENOUS | Status: DC

## 2014-10-28 MED ORDER — METOPROLOL SUCCINATE ER 50 MG PO TB24
50.0000 mg | ORAL_TABLET | Freq: Every day | ORAL | Status: DC
  Filled 2014-10-28: qty 1

## 2014-10-28 MED ORDER — SURGIFOAM 100 EX MISC
CUTANEOUS | Status: DC | PRN
  Administered 2014-10-28: 20 mL via TOPICAL

## 2014-10-28 MED ORDER — MORPHINE SULFATE 2 MG/ML IJ SOLN
1.0000 mg | INTRAMUSCULAR | Status: DC | PRN
  Administered 2014-10-28: 4 mg via INTRAVENOUS
  Filled 2014-10-28: qty 2

## 2014-10-28 MED ORDER — MIDAZOLAM HCL 2 MG/2ML IJ SOLN
INTRAMUSCULAR | Status: AC
  Filled 2014-10-28: qty 2

## 2014-10-28 SURGICAL SUPPLY — 60 items
APL SKNCLS STERI-STRIP NONHPOA (GAUZE/BANDAGES/DRESSINGS) ×1
BAG DECANTER FOR FLEXI CONT (MISCELLANEOUS) ×2 IMPLANT
BENZOIN TINCTURE PRP APPL 2/3 (GAUZE/BANDAGES/DRESSINGS) ×2 IMPLANT
BIT DRILL VUEPOINT II (BIT) ×1 IMPLANT
BLADE CLIPPER SURG (BLADE) IMPLANT
BONE MATRIX OSTEOCEL PRO MED (Bone Implant) ×2 IMPLANT
BUR MATCHSTICK NEURO 3.0 LAGG (BURR) ×2 IMPLANT
CANISTER SUCT 3000ML PPV (MISCELLANEOUS) ×2 IMPLANT
CONT SPEC 4OZ CLIKSEAL STRL BL (MISCELLANEOUS) ×2 IMPLANT
DRAPE C-ARM 42X72 X-RAY (DRAPES) ×4 IMPLANT
DRAPE LAPAROTOMY 100X72 PEDS (DRAPES) ×2 IMPLANT
DRAPE MICROSCOPE LEICA (MISCELLANEOUS) IMPLANT
DRAPE POUCH INSTRU U-SHP 10X18 (DRAPES) ×2 IMPLANT
DRILL BIT VUEPOINT II (BIT) ×2
DRSG OPSITE 4X5.5 SM (GAUZE/BANDAGES/DRESSINGS) IMPLANT
DRSG OPSITE POSTOP 4X6 (GAUZE/BANDAGES/DRESSINGS) ×2 IMPLANT
DRSG TELFA 3X8 NADH (GAUZE/BANDAGES/DRESSINGS) IMPLANT
DURAPREP 26ML APPLICATOR (WOUND CARE) ×2 IMPLANT
ELECT REM PT RETURN 9FT ADLT (ELECTROSURGICAL) ×2
ELECTRODE REM PT RTRN 9FT ADLT (ELECTROSURGICAL) ×1 IMPLANT
EVACUATOR 1/8 PVC DRAIN (DRAIN) IMPLANT
GAUZE SPONGE 4X4 12PLY STRL (GAUZE/BANDAGES/DRESSINGS) IMPLANT
GAUZE SPONGE 4X4 16PLY XRAY LF (GAUZE/BANDAGES/DRESSINGS) IMPLANT
GLOVE BIO SURGEON STRL SZ7 (GLOVE) ×2 IMPLANT
GLOVE BIO SURGEON STRL SZ8 (GLOVE) ×2 IMPLANT
GLOVE BIOGEL PI IND STRL 7.5 (GLOVE) ×1 IMPLANT
GLOVE BIOGEL PI INDICATOR 7.5 (GLOVE) ×1
GLOVE ECLIPSE 8.0 STRL XLNG CF (GLOVE) ×2 IMPLANT
GLOVE SURG SS PI 7.0 STRL IVOR (GLOVE) ×4 IMPLANT
GOWN STRL REUS W/ TWL LRG LVL3 (GOWN DISPOSABLE) IMPLANT
GOWN STRL REUS W/ TWL XL LVL3 (GOWN DISPOSABLE) ×4 IMPLANT
GOWN STRL REUS W/TWL 2XL LVL3 (GOWN DISPOSABLE) IMPLANT
GOWN STRL REUS W/TWL LRG LVL3 (GOWN DISPOSABLE)
GOWN STRL REUS W/TWL XL LVL3 (GOWN DISPOSABLE) ×8
HEMOSTAT POWDER KIT SURGIFOAM (HEMOSTASIS) ×2 IMPLANT
KIT BASIN OR (CUSTOM PROCEDURE TRAY) ×2 IMPLANT
KIT ROOM TURNOVER OR (KITS) ×2 IMPLANT
NEEDLE HYPO 22GX1.5 SAFETY (NEEDLE) ×2 IMPLANT
NEEDLE SPNL 20GX3.5 QUINCKE YW (NEEDLE) ×2 IMPLANT
NS IRRIG 1000ML POUR BTL (IV SOLUTION) ×2 IMPLANT
PACK LAMINECTOMY NEURO (CUSTOM PROCEDURE TRAY) ×2 IMPLANT
PAD ARMBOARD 7.5X6 YLW CONV (MISCELLANEOUS) ×10 IMPLANT
PIN MAYFIELD SKULL DISP (PIN) ×2 IMPLANT
ROD 120MM (Rod) ×2 IMPLANT
ROD SPNL 120X3.5XNS LF TI (Rod) ×1 IMPLANT
RUBBERBAND STERILE (MISCELLANEOUS) IMPLANT
SCREW MA MM 3.5X12 (Screw) ×16 IMPLANT
SCREW SET THREADED (Screw) ×16 IMPLANT
SPONGE LAP 4X18 X RAY DECT (DISPOSABLE) IMPLANT
SPONGE SURGIFOAM ABS GEL 100 (HEMOSTASIS) ×2 IMPLANT
STRIP CLOSURE SKIN 1/2X4 (GAUZE/BANDAGES/DRESSINGS) ×2 IMPLANT
SUT VIC AB 0 CT1 18XCR BRD8 (SUTURE) ×1 IMPLANT
SUT VIC AB 0 CT1 8-18 (SUTURE) ×2
SUT VIC AB 2-0 CP2 18 (SUTURE) ×2 IMPLANT
SUT VIC AB 3-0 SH 8-18 (SUTURE) ×4 IMPLANT
SWABSTICK BENZOIN STERILE (MISCELLANEOUS) ×2 IMPLANT
SYR 20ML ECCENTRIC (SYRINGE) ×2 IMPLANT
TOWEL OR 17X24 6PK STRL BLUE (TOWEL DISPOSABLE) ×2 IMPLANT
TOWEL OR 17X26 10 PK STRL BLUE (TOWEL DISPOSABLE) ×2 IMPLANT
WATER STERILE IRR 1000ML POUR (IV SOLUTION) ×2 IMPLANT

## 2014-10-28 NOTE — Progress Notes (Signed)
Pt is crying and c/o 10/10 throbbing pain in the posterior neck area. Medication has been given with no relief.  Notified Dr. Ronnald Ramp and new orders were given. Will continue to monitor Pt. Holli Humbles, RN

## 2014-10-28 NOTE — Anesthesia Preprocedure Evaluation (Addendum)
Anesthesia Evaluation  Patient identified by MRN, date of birth, ID band Patient awake    Reviewed: Allergy & Precautions, NPO status , Patient's Chart, lab work & pertinent test results, reviewed documented beta blocker date and time   Airway Mallampati: II   Neck ROM: Full    Dental  (+) Dental Advisory Given, Teeth Intact   Pulmonary asthma , Current Smoker,  breath sounds clear to auscultation        Cardiovascular hypertension, Pt. on medications Rhythm:Regular  EKG OK   Neuro/Psych  Headaches, Anxiety Depression PTSD, Anxiety Neuromuscular disease    GI/Hepatic negative GI ROS, Neg liver ROS,   Endo/Other  negative endocrine ROS  Renal/GU negative Renal ROS     Musculoskeletal   Abdominal (+)  Abdomen: soft.    Peds  Hematology 15/44   Anesthesia Other Findings   Reproductive/Obstetrics                            Anesthesia Physical Anesthesia Plan  ASA: II  Anesthesia Plan: General   Post-op Pain Management:    Induction: Intravenous  Airway Management Planned: Oral ETT  Additional Equipment:   Intra-op Plan:   Post-operative Plan: Extubation in OR  Informed Consent: I have reviewed the patients History and Physical, chart, labs and discussed the procedure including the risks, benefits and alternatives for the proposed anesthesia with the patient or authorized representative who has indicated his/her understanding and acceptance.     Plan Discussed with:   Anesthesia Plan Comments: (Allergic to EGGS but has had Propofol without problem, PTSD, (Good candidate for some Precedex))        Anesthesia Quick Evaluation

## 2014-10-28 NOTE — Anesthesia Procedure Notes (Signed)
Procedure Name: Intubation Date/Time: 10/28/2014 12:35 PM Performed by: Melina Copa, Tiburcio Linder R Pre-anesthesia Checklist: Patient identified, Emergency Drugs available, Suction available, Patient being monitored and Timeout performed Patient Re-evaluated:Patient Re-evaluated prior to inductionOxygen Delivery Method: Circle system utilized Preoxygenation: Pre-oxygenation with 100% oxygen Intubation Type: IV induction Ventilation: Mask ventilation without difficulty Laryngoscope Size: Mac and 3 Grade View: Grade I Tube type: Oral Tube size: 7.5 mm Number of attempts: 1 Airway Equipment and Method: Stylet Placement Confirmation: ETT inserted through vocal cords under direct vision,  positive ETCO2 and breath sounds checked- equal and bilateral Secured at: 24 cm Tube secured with: Tape Dental Injury: Teeth and Oropharynx as per pre-operative assessment

## 2014-10-28 NOTE — Op Note (Signed)
10/28/2014  2:11 PM  PATIENT:  [name]  39 y.o. female  PRE-OPERATIVE DIAGNOSIS:  Pseudoarthrosis C5-6 C6-7 with adjacent level disc protrusion and spondylosis C4-5, neck and arm pain  POST-OPERATIVE DIAGNOSIS:  Same  PROCEDURE:  1. Posterior cervical fusion C4-C7 inclusive utilizing morcellized allograft, 2. Segmental fixation utilizing Nuvasive  lateral mass screws C4-C7 bilaterally  SURGEON:  Sherley Bounds, MD  ASSISTANTS: Dr. Hal Neer  ANESTHESIA:   General  EBL: Less than 25Kml  Total I/O In: 1000 [I.V.:1000] Out: -   BLOOD ADMINISTERED:none  DRAINS:  None   SPECIMEN:  No Specimen  INDICATION FOR PROCEDURE:  This patient underwent a two-level ACDF for about a year ago done by another Psychologist, sport and exercise. She presented with chronic neck and arm pain. CT myelogram showed a pseudoarthrosis at C5-6 and C6-7 with adjacent level spondylosis and disc protrusion at C4-5. Radical medical management without relief. Recommended C4 C7 posterior cervical fusion with lateral mass fixation. Patient understood the risks, benefits, and alternatives and potential outcomes and wished to proceed.  PROCEDURE DETAILS: The patient was brought to the operating room. Generalized endotracheal anesthesia was induced. The patient was affixed a 3 point [place] headrest and rolled into the prone position on chest rolls. All pressure points were padded. The posterior cervical region was cleaned and prepped with DuraPrep and then draped in the usual sterile fashion. 7 cc of local anesthesia was injected and a dorsal midline incision made in the posterior cervical region and carried down to the cervical fascia. The fascia was opened and the paraspinous musculature was taken down to expose C4-C7 lateral masses bilaterally. Intraoperative fluoroscopy confirmed my level and then the dissection was carried out over the lateral facets. I localized the midpoint of each lateral mass and marked a region 1 mm medial  to the midpoint of the lateral mass, and then drilled in an upward and outward direction into the safe zone of each lateral mass. I drilled to a depth of 12 mm and then checked my drill hole with a ball probe. I then placed a 12 mm lateral mass screws into the safe zone of each lateral mass until they were 2 fingers tight from C4-C7 bilaterally. . I then decorticated the lateral masses and the facet joints and packed them with local autograft and morcellized allograft to perform arthrodesis from C4-C7 bilaterally. I then placed rods into the multiaxial screw heads of the screws and locked these into position with the locking caps and anti-torque device. I then checked the final construct with AP/Lat fluoroscopy. I irrigated with saline solution containing bacitracin. After hemostasis was achieved I closed the muscle and the fascia with 0 Vicryl, subcutaneous tissue with 2-0 Vicryl, and the subcuticular tissue with 3-0 Vicryl. The skin was closed with benzoin and Steri-Strips. A sterile dressing was applied, the patient was turned to the supine position and taken out of the headrest, awakened from general anesthesia and transferred to the recovery room in stable condition. At the end of the procedure all sponge, needle and instrument counts were correct.   PLAN OF CARE: Admit to inpatient   PATIENT DISPOSITION:  PACU - hemodynamically stable.   Delay start of Pharmacological VTE agent (>24hrs) due to surgical blood loss or risk of bleeding:  yes

## 2014-10-28 NOTE — Progress Notes (Signed)
Pt transported per AMy, NT

## 2014-10-28 NOTE — Anesthesia Postprocedure Evaluation (Signed)
  Anesthesia Post-op Note  Patient: [name]  Procedure(s) Performed: Procedure(s) with comments: Cervical Four-Cervical Seven Posterior cervical fusion with lateral mass fixation (N/A) - posterior  Patient Location: PACU  Anesthesia Type:General  Level of Consciousness: awake  Airway and Oxygen Therapy: Patient Spontanous Breathing  Post-op Pain: mild  Post-op Assessment: Post-op Vital signs reviewed              Post-op Vital Signs: Reviewed and stable  Last Vitals:  Filed Vitals:   10/28/14 1419  BP: 137/87  Pulse: 114  Temp:   Resp: 30    Complications: No apparent anesthesia complications

## 2014-10-28 NOTE — Transfer of Care (Signed)
Immediate Anesthesia Transfer of Care Note  Patient: [name]  Procedure(s) Performed: Procedure(s) with comments: Cervical Four-Cervical Seven Posterior cervical fusion with lateral mass fixation (N/A) - posterior  Patient Location: PACU  Anesthesia Type:General  Level of Consciousness: awake  Airway & Oxygen Therapy: Patient Spontanous Breathing and Patient connected to nasal cannula oxygen  Post-op Assessment: Report given to RN, Post -op Vital signs reviewed and stable and Patient moving all extremities  Post vital signs: Reviewed and stable  Last Vitals:  Filed Vitals:   10/28/14 1041  BP: 117/72  Pulse: 79  Temp: 36.6 C  Resp: 20    Complications: No apparent anesthesia complications

## 2014-10-29 ENCOUNTER — Encounter (HOSPITAL_COMMUNITY): Payer: Self-pay | Admitting: Neurological Surgery

## 2014-10-29 DIAGNOSIS — M960 Pseudarthrosis after fusion or arthrodesis: Secondary | ICD-10-CM | POA: Diagnosis not present

## 2014-10-29 MED ORDER — DIAZEPAM 5 MG PO TABS: 5.0000 mg | ORAL_TABLET | Freq: Three times a day (TID) | ORAL | Status: DC | PRN

## 2014-10-29 MED ORDER — HYDROMORPHONE HCL 2 MG PO TABS: 2.0000 mg | ORAL_TABLET | ORAL | Status: DC | PRN

## 2014-10-29 NOTE — Discharge Summary (Signed)
Physician Discharge Summary  Patient ID: [name] MRN: [id number] DOB/AGE: [birth year] 39 y.o.  Admit date: 10/28/2014 Discharge date: 10/29/2014  Admission Diagnoses: cervical spondylosis/. pseudoarthrosis    Discharge Diagnoses: same   Discharged Condition: good  Hospital Course: The patient was admitted on 10/28/2014 and taken to the operating room where the patient underwent PCF C44-7. The patient tolerated the procedure well and was taken to the recovery room and then to the floor in stable condition. The hospital course was routine. There were no complications. The wound remained clean dry and intact. Pt had appropriate neck soreness. No complaints of arm pain or new N/T/W. The patient remained afebrile with stable vital signs, and tolerated a regular diet. The patient continued to increase activities, and pain was well controlled with oral pain medications.   Consults: None  Significant Diagnostic Studies:  Results for orders placed or performed during the hospital encounter of 10/20/14  Surgical pcr screen  Result Value Ref Range   MRSA, PCR NEGATIVE NEGATIVE   Staphylococcus aureus NEGATIVE NEGATIVE  Basic metabolic panel  Result Value Ref Range   Sodium 136 135 - 145 mmol/L   Potassium 3.9 3.5 - 5.1 mmol/L   Chloride 104 101 - 111 mmol/L   CO2 22 22 - 32 mmol/L   Glucose, Bld 104 (H) 65 - 99 mg/dL   BUN 19 6 - 20 mg/dL   Creatinine, Ser 0.82 0.44 - 1.00 mg/dL   Calcium 9.5 8.9 - 10.3 mg/dL   GFR calc non Af Amer >60 >60 mL/min   GFR calc Af Amer >60 >60 mL/min   Anion gap 10 5 - 15  CBC WITH DIFFERENTIAL  Result Value Ref Range   WBC 9.0 4.0 - 10.5 K/uL   RBC 4.75 3.87 - 5.11 MIL/uL   Hemoglobin 15.4 (H) 12.0 - 15.0 g/dL   HCT 44.8 36.0 - 46.0 %   MCV 94.3 78.0 - 100.0 fL   MCH 32.4 26.0 - 34.0 pg   MCHC 34.4 30.0 - 36.0 g/dL   RDW 13.5 11.5 - 15.5 %   Platelets 325 150 - 400 K/uL   Neutrophils Relative % 61 43 - 77 %   Neutro Abs 5.5 1.7 - 7.7  K/uL   Lymphocytes Relative 26 12 - 46 %   Lymphs Abs 2.4 0.7 - 4.0 K/uL   Monocytes Relative 10 3 - 12 %   Monocytes Absolute 0.9 0.1 - 1.0 K/uL   Eosinophils Relative 3 0 - 5 %   Eosinophils Absolute 0.2 0.0 - 0.7 K/uL   Basophils Relative 0 0 - 1 %   Basophils Absolute 0.0 0.0 - 0.1 K/uL  Protime-INR  Result Value Ref Range   Prothrombin Time 13.3 11.6 - 15.2 seconds   INR 0.99 0.00 - 1.49  hCG, serum, qualitative  Result Value Ref Range   Preg, Serum NEGATIVE NEGATIVE    Chest 2 View  10/20/2014   CLINICAL DATA:  Hypertension.  EXAM: CHEST  2 VIEW  COMPARISON:  None.  FINDINGS: Mediastinum hilar structures normal. Lungs are clear. Heart size normal. No pleural effusion pneumothorax. Prior cervical thoracic spine fusion.  IMPRESSION: No acute cardiopulmonary disease.   Electronically Signed   By: Marcello Moores  Register   On: 10/20/2014 09:55   Dg Cervical Spine 2-3 Views  10/28/2014   CLINICAL DATA:  C4-C7 cervical spine fusion  EXAM: DG C-ARM 61-120 MIN; CERVICAL SPINE - 2-3 VIEW  TECHNIQUE: Three digital images obtained intraoperatively are submitted.  CONTRAST:  None administered  FLUOROSCOPY TIME:  Radiation Exposure Index (as provided by the fluoroscopic device): Not provided  If the device does not provide the exposure index:  Fluoroscopy Time (in minutes and seconds):  0 minutes 11 seconds  Number of Acquired Images:  3  COMPARISON:  None  FINDINGS: Bones demineralized.  Anterior plate and screws present at C5-C7 with intervening disc prostheses.  Placement of BILATERAL pedicle screws and posterior bars at C4-C7.  No acute fracture or subluxation.  Anterior support tubes.  IMPRESSION: Posterior fusion C4-C7.   Electronically Signed   By: Lavonia Dana M.D.   On: 10/28/2014 14:15   Dg C-arm 1-60 Min  10/28/2014   CLINICAL DATA:  C4-C7 cervical spine fusion  EXAM: DG C-ARM 61-120 MIN; CERVICAL SPINE - 2-3 VIEW  TECHNIQUE: Three digital images obtained intraoperatively are submitted.   CONTRAST:  None administered  FLUOROSCOPY TIME:  Radiation Exposure Index (as provided by the fluoroscopic device): Not provided  If the device does not provide the exposure index:  Fluoroscopy Time (in minutes and seconds):  0 minutes 11 seconds  Number of Acquired Images:  3  COMPARISON:  None  FINDINGS: Bones demineralized.  Anterior plate and screws present at C5-C7 with intervening disc prostheses.  Placement of BILATERAL pedicle screws and posterior bars at C4-C7.  No acute fracture or subluxation.  Anterior support tubes.  IMPRESSION: Posterior fusion C4-C7.   Electronically Signed   By: Lavonia Dana M.D.   On: 10/28/2014 14:15    Antibiotics:  Anti-infectives    Start     Dose/Rate Route Frequency Ordered Stop   10/28/14 2100  ceFAZolin (ANCEF) IVPB 1 g/50 mL premix     1 g 100 mL/hr over 30 Minutes Intravenous Every 8 hours 10/28/14 1608 10/29/14 0430   10/28/14 1337  bacitracin 50,000 Units in sodium chloride irrigation 0.9 % 500 mL irrigation  Status:  Discontinued       As needed 10/28/14 1337 10/28/14 1412   10/28/14 1130  ceFAZolin (ANCEF) IVPB 2 g/50 mL premix     2 g 100 mL/hr over 30 Minutes Intravenous To [facility] 10/27/14 1246 10/28/14 1306      Discharge Exam: Blood pressure 105/53, pulse 88, temperature 98 F (36.7 C), temperature source Oral, resp. rate 16, height 5\' 3"  (1.6 m), weight 136 lb 6.4 oz (61.871 kg), last menstrual period 10/25/2014, SpO2 99 %. Neurologic: Grossly normal Dressing dry  Discharge Medications:     Medication List    STOP taking these medications        meloxicam 15 MG tablet  Commonly known as:  MOBIC      TAKE these medications        albuterol 108 (90 BASE) MCG/ACT inhaler  Commonly known as:  PROVENTIL HFA;VENTOLIN HFA  Inhale 2 puffs into the lungs every 4 (four) hours as needed for wheezing.     clonazePAM 1 MG tablet  Commonly known as:  Elinor Parkinson on or after may 3rd' 2016     diazepam 5 MG tablet   Commonly known as:  VALIUM  Take 1 tablet (5 mg total) by mouth every 8 (eight) hours as needed for anxiety or muscle spasms.     diphenhydrAMINE 25 MG tablet  Commonly known as:  BENADRYL  Take 50 mg by mouth every 6 (six) hours as needed.     DULoxetine 30 MG capsule  Commonly known as:  CYMBALTA  Take 1 capsule (30 mg  total) by mouth daily.     DULoxetine 60 MG capsule  Commonly known as:  CYMBALTA  Take 1 capsule (60 mg total) by mouth daily.     eszopiclone 2 MG Tabs tablet  Commonly known as:  LUNESTA  Take 2 mg by mouth at bedtime. Take immediately before bedtime     HYDROmorphone 2 MG tablet  Commonly known as:  DILAUDID  Take 1 tablet (2 mg total) by mouth every 4 (four) hours as needed for moderate pain or severe pain.     metoprolol succinate 50 MG 24 hr tablet  Commonly known as:  TOPROL-XL  Take 1 tablet (50 mg total) by mouth daily. Take with or immediately following a meal.        Disposition: home   Final Dx: PCF C4-7      Discharge Instructions     Remove dressing in 72 hours    Complete by:  As directed      Call MD for:  difficulty breathing, headache or visual disturbances    Complete by:  As directed      Call MD for:  persistant nausea and vomiting    Complete by:  As directed      Call MD for:  redness, tenderness, or signs of infection (pain, swelling, redness, odor or green/yellow discharge around incision site)    Complete by:  As directed      Call MD for:  severe uncontrolled pain    Complete by:  As directed      Call MD for:  temperature >100.4    Complete by:  As directed      Diet - low sodium heart healthy    Complete by:  As directed      Discharge instructions    Complete by:  As directed   No driving, no heavy lifting, may shower     Increase activity slowly    Complete by:  As directed               Signed: Gionna Polak S 10/29/2014, 9:48 AM

## 2014-10-29 NOTE — Discharge Instructions (Signed)
Wound Care Keep incision covered and dry for one week.  If you shower prior to then, cover incision with plastic wrap.  You may remove outer bandage after one week and shower.  Do not put any creams, lotions, or ointments on incision. Leave steri-strips on neck.  They will fall off by themselves. Activity Walk each and every day, increasing distance each day. No lifting greater than 5 lbs.  Avoid excessive neck motion. No driving for 2 weeks; may ride as a passenger locally. Wear neck brace at all times except when showering or otherwise instructed. Diet Resume your normal diet.  Return to Work Will be discussed at you follow up appointment. Call Your Doctor If Any of These Occur Redness, drainage, or swelling at the wound.  Temperature greater than 101 degrees. Severe pain not relieved by pain medication. Increased difficulty swallowing.  Incision starts to come apart. Follow Up Appt Call today for appointment in 1-2 weeks ([phone]) or for problems.  If you have any hardware placed in your spine, you will need an x-ray before your appointment.   Cervical Fusion The neck is the upper portion of your spine. The 7 bones in your neck are referred to as the cervical spine. Cervical fusion is a type of surgery that is done on the cervical spine to relieve pressure on the spinal cord or one or more nerve roots. There are two types of cervical fusion:  Anterior cervical fusion. This surgery is done through the front (anterior) part of your neck. During the surgery the affected intervertebral disk is removed to take pressure off the nerves or spinal cord. The area where the disc was removed is filled with a bone graft that causes the vertebral bodies to grow together (fuse) over time.  Posterior cervical fusion. This surgery is done through the back (posterior) of the neck. The surgery joins two or more neck vertebrae into one solid section of bone. Posterior cervical fusion is most commonly  used to treat neck fractures and dislocations and to fix deformities in the curve of the neck. LET [facility] CARE PROVIDER KNOW ABOUT:  Any allergies you have.  All medicines you are taking, including vitamins, herbs, eyedrops, creams, and over-the-counter medicines.  Previous problems you or members of your family have had with the use of anesthetics.  Any blood disorders or blood clotting problems you have.  Previous surgeries you have had.  Medical conditions you have. RISKS AND COMPLICATIONS Generally, this is a safe procedure. However, as with any procedure, problems can occur. Possible problems include:   Infection.   Bleeding with possible need for blood transfusion.   Injury to surrounding structures, including nerves.   Leakage of cerebrospinal fluid.   Blood clots.  Temporary breathing difficulties after surgery.  Extended hospital stay, especially with posterior cervical fusion. BEFORE THE PROCEDURE  Do not eat or drink for 6-8 hours before the procedure.   Take medicines as directed by your surgeon. Ask your surgeon about changing or stopping your regular medicines.   You will be given antibiotic medicines to keep the infection rate down.   The surgical cut (incision) site on your neck will be marked.   Your neck will be cleaned to reduce the risk of infection. PROCEDURE  The length of the procedure depends on what needs to be done. It usually takes 2 or more hours. For both procedures, you will be given medicine to make you sleep (general anesthetic). A breathing tube will be placed down  your throat.  Anterior Cervical Fusion  An incision will usually be made in a skin fold line at the front of your neck, in the area where the fusion will be placed.  The neck muscles will be pushed aside.   The surgeon will remove the affected, degenerated disk and bone spurs (decompression). This helps to take the pressure off the nerves and spinal cord.    The area where the disk was removed is then filled with a plastic spacer implant, bone graft, or both. These implants and bone grafts take the place of the disk and keep the nerve passageway open and clear for the nerves and spinal cord.   In most cases, the surgeon will put metal plates, pins, or screws (hardware) in the neck to help stabilize the surgical site and to keep the implants and bone grafts in place. The hardware reduces motion at the surgical site, so the bones can grow together. This provides extra support to the neck.  Posterior Cervical Fusion  An incision will be made through the back of the neck.   Two or more neck vertebrae will be joined into one solid section of bone.   Metal plates and pins or screws may be placed in the neck. These help stabilize the neck, providing extra support to help the bones to grow together more easily. AFTER THE PROCEDURE  You will stay in a recovery area until the anesthesia has worn off. Your blood pressure and pulse will be checked often.   You may continue to receive fluids and medicines, such as antibiotics, through the IV tube for several days after the surgery.   You may need to wear a neck or back brace for several weeks after surgery, especially when up and out of bed.   You may be given pain medicine while still in the recovery area. Some pain is normal, but if your pain gets worse, tell your surgeon or nurse.   Be up and moving as soon as possible after surgery. Physical therapists will help you start walking.   To prevent blood clots in your legs:  You may be given compression stockings to wear.   You may need to take medicine to prevent clots.  You may be asked to do breathing exercises. This is to prevent a lung infection.   Most people stay in the hospital for 1-3 days after this surgery.  Document Released: 10/08/2001 Document Revised: 04/23/2013 Document Reviewed: 10/18/2012 [facility] Patient  Information 2015 [place], [place]. This information is not intended to replace advice given to you by your health care provider. Make sure you discuss any questions you have with your health care provider.

## 2014-10-29 NOTE — H&P (Signed)
Subjective:   Patient is a 39 y.o. female admitted for necj pain. The patient first presented to me with complaints of neck pain since ACDF last year.  Onset of symptoms was over a uyear ago. The pain is described as aching and severe and occurs all day. The pain is rated severe, and is located in the neck and radiates to the arms. The symptoms have been progressive. Symptoms are exacerbated by activity, and are relieved by nothing.  Previous work up includes CT/myelo.  Past Medical History  Diagnosis Date  . Anxiety   . Headache(784.0)   . IBS (irritable bowel syndrome)     questionable, per pt; saw Dr. Collene Mares  . IC (interstitial cystitis)   . Insomnia   . Asthma   . Hypertension     no longer takes meds for HTN  . Arthritis   . Fibromyalgia   . Elevated heart rate and blood pressure     takes toprol for increased heart rate  . Complication of anesthesia   . PONV (postoperative nausea and vomiting)     Past Surgical History  Procedure Laterality Date  . Cesarean section    . Pelvic laparoscopy  2003    endometriosis  . Ovarian cyst removal    . Dilation and evacuation N/A 05/07/2013    Procedure: DILATATION AND EVACUATION with tissue sent for chromosome analysis;  Surgeon: Lovenia Kim, MD;  Location: [facility];  Service: Gynecology;  Laterality: N/A;  . Cervical spine surgery  8/15  . Carpal tunnel release Left 06/05/2014    Procedure: LEFT CARPAL TUNNEL RELEASE;  Surgeon: Daryll Brod, MD;  Location: [facility];  Service: Orthopedics;  Laterality: Left;  . Robotic assisted laparoscopic lysis of adhesion N/A 07/18/2014    Procedure: ROBOTIC ASSISTED LAPAROSCOPIC  EXCISION POSTERIOR  UTERINE WALL MASS; EXCISION RIGHT MESSALEINGEAL MASS, EXCISION AND ABLATION CULDASAC ENDOMETRIOSIS;  Surgeon: Brien Few, MD;  Location: [facility];  Service: Gynecology;  Laterality: N/A;  . Carpal tunnel release Right 08/25/2014    Procedure: RIGHT CARPAL TUNNEL RELEASE;  Surgeon: Daryll Brod,  MD;  Location: [facility];  Service: Orthopedics;  Laterality: Right;    Allergies  Allergen Reactions  . Iodinated Diagnostic Agents Rash  . Gadolinium Derivatives Hives, Itching and Other (See Comments)    After MRI with Multihance finished, patient had redness and itching on chest, tightness in throat.  Patient went to ED to be monitored (04/23/14).  . Hydrocodone-Acetaminophen Itching    Tolerates with benadryl (no reaction to oxycodone)  . Tramadol Itching  . Amitriptyline Other (See Comments)    Gained weight and does not want to be on.   . Eggs Or Egg-Derived Products Rash    History  Substance Use Topics  . Smoking status: Current Some Day Smoker -- 0.50 packs/day for 18 years    Types: Cigarettes  . Smokeless tobacco: Never Used  . Alcohol Use: No    Family History  Problem Relation Age of Onset  . Heart attack Father   . ADD / ADHD Brother   . Depression Brother   . Depression Cousin    Prior to Admission medications   Medication Sig Start Date End Date Taking? Authorizing Provider  clonazePAM Bobbye Charleston) 1 MG tablet Fill on or after may 3rd' 2016 Patient taking differently: Take 1 mg by mouth 2 (two) times daily. Take 1/2 tablet (0.5 mg) every morning and 1 tablet (1 mg) at bedtime 10/03/14  Yes Merian Capron,  MD  diphenhydrAMINE (BENADRYL) 25 MG tablet Take 50 mg by mouth every 6 (six) hours as needed.   Yes Historical Provider, MD  DULoxetine (CYMBALTA) 30 MG capsule Take 1 capsule (30 mg total) by mouth daily. Patient taking differently: Take 30 mg by mouth daily. Take with a 60 mg capsule for a 90 mg dose every morning 08/05/14  Yes Silverio Decamp, MD  DULoxetine (CYMBALTA) 60 MG capsule Take 1 capsule (60 mg total) by mouth daily. Patient taking differently: Take 60 mg by mouth daily. Take with a 30 mg capsule for a 90 mg dose every morning 08/19/14  Yes Jade L Breeback, PA-C  eszopiclone (LUNESTA) 2 MG TABS tablet Take 2 mg by mouth at bedtime.  Take immediately before bedtime   Yes Historical Provider, MD  meloxicam (MOBIC) 15 MG tablet Take 1 tablet (15 mg total) by mouth daily with breakfast. 08/19/14  Yes Jade L Breeback, PA-C  metoprolol succinate (TOPROL-XL) 50 MG 24 hr tablet Take 1 tablet (50 mg total) by mouth daily. Take with or immediately following a meal. 08/13/14  Yes Jade L Breeback, PA-C  albuterol (PROVENTIL HFA;VENTOLIN HFA) 108 (90 BASE) MCG/ACT inhaler Inhale 2 puffs into the lungs every 4 (four) hours as needed for wheezing. Patient taking differently: Inhale 2 puffs into the lungs every 4 (four) hours as needed for wheezing or shortness of breath (bronchitis).  04/14/14 04/14/15  Donella Stade, PA-C     Review of Systems  Positive ROS: neg  All other systems have been reviewed and were otherwise negative with the exception of those mentioned in the HPI and as above.  Objective: Vital signs in last 24 hours: Temp:  [97.7 F (36.5 C)-98 F (36.7 C)] 98 F (36.7 C) (06/29 0400) Pulse Rate:  [74-114] 80 (06/29 0400) Resp:  [13-30] 16 (06/29 0400) BP: (100-137)/(45-87) 100/61 mmHg (06/29 0400) SpO2:  [96 %-100 %] 98 % (06/29 0400) Weight:  [136 lb 6.4 oz (61.871 kg)] 136 lb 6.4 oz (61.871 kg) (06/28 1041)  General Appearance: Alert, cooperative, no distress, appears stated age Head: Normocephalic, without obvious abnormality, atraumatic Eyes: PERRL, conjunctiva/corneas clear, EOM's intact      Neck: Supple, symmetrical, trachea midline, Back: Symmetric, no curvature, ROM normal, no CVA tenderness Lungs:  respirations unlabored Heart: Regular rate and rhythm Abdomen: Soft, non-tender Extremities: Extremities normal, atraumatic, no cyanosis or edema Pulses: 2+ and symmetric all extremities Skin: Skin color, texture, turgor normal, no rashes or lesions  NEUROLOGIC:  Mental status: Alert and oriented x4, no aphasia, good attention span, fund of knowledge and memory  Motor Exam - grossly normal Sensory  Exam - grossly normal Reflexes: 1+ Coordination - grossly normal Gait - grossly normal Balance - grossly normal Cranial Nerves: I: smell Not tested  II: visual acuity  OS: nl    OD: nl  II: visual fields Full to confrontation  II: pupils Equal, round, reactive to light  III,VII: ptosis None  III,IV,VI: extraocular muscles  Full ROM  V: mastication Normal  V: facial light touch sensation  Normal  V,VII: corneal reflex  Present  VII: facial muscle function - upper  Normal  VII: facial muscle function - lower Normal  VIII: hearing Not tested  IX: soft palate elevation  Normal  IX,X: gag reflex Present  XI: trapezius strength  5/5  XI: sternocleidomastoid strength 5/5  XI: neck flexion strength  5/5  XII: tongue strength  Normal    Data Review Lab Results  Component Value  Date   WBC 9.0 10/20/2014   HGB 15.4* 10/20/2014   HCT 44.8 10/20/2014   MCV 94.3 10/20/2014   PLT 325 10/20/2014   Lab Results  Component Value Date   NA 136 10/20/2014   K 3.9 10/20/2014   CL 104 10/20/2014   CO2 22 10/20/2014   BUN 19 10/20/2014   CREATININE 0.82 10/20/2014   GLUCOSE 104* 10/20/2014   Lab Results  Component Value Date   INR 0.99 10/20/2014    Assessment:   Cervical neck pain with herniated nucleus pulposus/ spondylosis C4-5,pseudoarthrosis C5-7. Patient has failed conservative therapy. Planned surgery : PCF C4-7  Plan:   I explained the condition and procedure to the patient and answered any questions.  Patient wishes to proceed with procedure as planned. Understands risks/ benefits/ and expected or typical outcomes.  Adonias Demore S 10/29/2014 6:35 AM

## 2014-10-29 NOTE — Progress Notes (Signed)
Pt given D/C instructions with Rx's, verbal understanding was provided. Pt's incision has a scant amount of drainage. Pt's IV was removed prior to D/C. Pt D/C'd home via walking @ 1200 per MD order. Pt is stable @ D/C and has no other needs at this time. Holli Humbles, RN

## 2014-11-25 ENCOUNTER — Other Ambulatory Visit (HOSPITAL_COMMUNITY): Payer: Self-pay | Admitting: Psychiatry

## 2014-11-25 ENCOUNTER — Encounter: Payer: Self-pay | Admitting: Family Medicine

## 2014-11-25 ENCOUNTER — Ambulatory Visit (INDEPENDENT_AMBULATORY_CARE_PROVIDER_SITE_OTHER): Payer: 59 | Admitting: Family Medicine

## 2014-11-25 VITALS — BP 122/82 | HR 72 | Temp 98.5°F | Wt 138.0 lb

## 2014-11-25 DIAGNOSIS — R1110 Vomiting, unspecified: Secondary | ICD-10-CM

## 2014-11-25 DIAGNOSIS — R112 Nausea with vomiting, unspecified: Secondary | ICD-10-CM | POA: Insufficient documentation

## 2014-11-25 MED ORDER — PROMETHAZINE HCL 25 MG RE SUPP: 25.0000 mg | Freq: Four times a day (QID) | RECTAL | Status: DC | PRN

## 2014-11-25 MED ORDER — METOCLOPRAMIDE HCL 5 MG PO TABS: 5.0000 mg | ORAL_TABLET | Freq: Three times a day (TID) | ORAL | Status: DC | PRN

## 2014-11-25 NOTE — Assessment & Plan Note (Signed)
Vomiting likely due to viral gastroenteritis. Plan for watchful waiting with a trial of Reglan and rectal Phenergan.

## 2014-11-25 NOTE — Progress Notes (Signed)
[name] is a 39 y.o. female who presents to [facility]  today for vomiting. Patient has a three-day history of nausea and vomiting. She denies any diarrhea or abdominal pain. She denies any blood in the vomit. No fevers or chills chest pains or palpitations. She tried some leftover Phenergan which did not help very much. She notes that she recently had a large multilevel posterior cervical fusion surgery. She has been considerably her opiate doses recently. Her neck hurts but is feeling better than it did.   Past Medical History  Diagnosis Date  . Anxiety   . Headache(784.0)   . IBS (irritable bowel syndrome)     questionable, per pt; saw Dr. Collene Mares  . IC (interstitial cystitis)   . Insomnia   . Asthma   . Hypertension     no longer takes meds for HTN  . Arthritis   . Fibromyalgia   . Elevated heart rate and blood pressure     takes toprol for increased heart rate  . Complication of anesthesia   . PONV (postoperative nausea and vomiting)    Past Surgical History  Procedure Laterality Date  . Cesarean section    . Pelvic laparoscopy  2003    endometriosis  . Ovarian cyst removal    . Dilation and evacuation N/A 05/07/2013    Procedure: DILATATION AND EVACUATION with tissue sent for chromosome analysis;  Surgeon: Lovenia Kim, MD;  Location: [facility];  Service: Gynecology;  Laterality: N/A;  . Cervical spine surgery  8/15  . Carpal tunnel release Left 06/05/2014    Procedure: LEFT CARPAL TUNNEL RELEASE;  Surgeon: Daryll Brod, MD;  Location: [facility];  Service: Orthopedics;  Laterality: Left;  . Robotic assisted laparoscopic lysis of adhesion N/A 07/18/2014    Procedure: ROBOTIC ASSISTED LAPAROSCOPIC  EXCISION POSTERIOR  UTERINE WALL MASS; EXCISION RIGHT MESSALEINGEAL MASS, EXCISION AND ABLATION CULDASAC ENDOMETRIOSIS;  Surgeon: Brien Few, MD;  Location: [facility];  Service: Gynecology;  Laterality: N/A;  . Carpal  tunnel release Right 08/25/2014    Procedure: RIGHT CARPAL TUNNEL RELEASE;  Surgeon: Daryll Brod, MD;  Location: [facility];  Service: Orthopedics;  Laterality: Right;  . Posterior cervical fusion/foraminotomy N/A 10/28/2014    Procedure: Cervical Four-Cervical Seven Posterior cervical fusion with lateral mass fixation;  Surgeon: Eustace Moore, MD;  Location: [facility];  Service: Neurosurgery;  Laterality: N/A;  posterior   History  Substance Use Topics  . Smoking status: Current Some Day Smoker -- 0.50 packs/day for 18 years    Types: Cigarettes  . Smokeless tobacco: Never Used  . Alcohol Use: No   ROS as above Medications: Current Outpatient Prescriptions  Medication Sig Dispense Refill  . albuterol (PROVENTIL HFA;VENTOLIN HFA) 108 (90 BASE) MCG/ACT inhaler Inhale 2 puffs into the lungs every 4 (four) hours as needed for wheezing. (Patient taking differently: Inhale 2 puffs into the lungs every 4 (four) hours as needed for wheezing or shortness of breath (bronchitis). ) 1 Inhaler 1  . diazepam (VALIUM) 5 MG tablet Take 1 tablet (5 mg total) by mouth every 8 (eight) hours as needed for anxiety or muscle spasms. 30 tablet 0  . diphenhydrAMINE (BENADRYL) 25 MG tablet Take 50 mg by mouth every 6 (six) hours as needed.    . eszopiclone (LUNESTA) 2 MG TABS tablet Take 2 mg by mouth at bedtime. Take immediately before bedtime    . metoprolol succinate (TOPROL-XL) 50 MG  24 hr tablet Take 1 tablet (50 mg total) by mouth daily. Take with or immediately following a meal. 90 tablet 1  . clonazePAM (KLONOPIN) 1 MG tablet Fill on or after may 3rd' 2016 (Patient not taking: Reported on 11/25/2014) 45 tablet 0  . metoCLOPramide (REGLAN) 5 MG tablet Take 1 tablet (5 mg total) by mouth every 8 (eight) hours as needed for nausea or vomiting. 40 tablet 0  . promethazine (PHENERGAN) 25 MG suppository Place 1 suppository (25 mg total) rectally every 6 (six) hours as needed for nausea or vomiting. 12  each 0   No current facility-administered medications for this visit.   Allergies  Allergen Reactions  . Iodinated Diagnostic Agents Rash  . Gadolinium Derivatives Hives, Itching and Other (See Comments)    After MRI with Multihance finished, patient had redness and itching on chest, tightness in throat.  Patient went to ED to be monitored (04/23/14).  . Hydrocodone-Acetaminophen Itching    Tolerates with benadryl (no reaction to oxycodone)  . Tramadol Itching  . Amitriptyline Other (See Comments)    Gained weight and does not want to be on.   . Eggs Or Egg-Derived Products Rash     Exam:  BP 122/82 mmHg  Pulse 72  Temp(Src) 98.5 F (36.9 C) (Oral)  Wt 138 lb (62.596 kg)  SpO2 99% Gen: Well NAD HEENT: EOMI,  MMM Lungs: Normal work of breathing. CTABL Heart: RRR no MRG Abd: NABS, Soft. Nondistended, Nontender Exts: Brisk capillary refill, warm and well perfused.  Skin: Posterior neck well-appearing incision with no discharge or erythema.  No results found for this or any previous visit (from the past 24 hour(s)). No results found.   Please see individual assessment and plan sections.

## 2014-11-25 NOTE — Patient Instructions (Signed)
Thank you for coming in today. Take oral Reglan for continued vomiting. If this does not work he can use rectal Phenergan suppositories. If your belly pain worsens, or you have high fever, bad vomiting, blood in your stool or black tarry stool go to the Emergency Room.   Nausea and Vomiting Nausea is a sick feeling that often comes before throwing up (vomiting). Vomiting is a reflex where stomach contents come out of your mouth. Vomiting can cause severe loss of body fluids (dehydration). Children and elderly adults can become dehydrated quickly, especially if they also have diarrhea. Nausea and vomiting are symptoms of a condition or disease. It is important to find the cause of your symptoms. CAUSES   Direct irritation of the stomach lining. This irritation can result from increased acid production (gastroesophageal reflux disease), infection, food poisoning, taking certain medicines (such as nonsteroidal anti-inflammatory drugs), alcohol use, or tobacco use.  Signals from the brain.These signals could be caused by a headache, heat exposure, an inner ear disturbance, increased pressure in the brain from injury, infection, a tumor, or a concussion, pain, emotional stimulus, or metabolic problems.  An obstruction in the gastrointestinal tract (bowel obstruction).  Illnesses such as diabetes, hepatitis, gallbladder problems, appendicitis, kidney problems, cancer, sepsis, atypical symptoms of a heart attack, or eating disorders.  Medical treatments such as chemotherapy and radiation.  Receiving medicine that makes you sleep (general anesthetic) during surgery. DIAGNOSIS Your caregiver may ask for tests to be done if the problems do not improve after a few days. Tests may also be done if symptoms are severe or if the reason for the nausea and vomiting is not clear. Tests may include:  Urine tests.  Blood tests.  Stool tests.  Cultures (to look for evidence of infection).  X-rays or other  imaging studies. Test results can help your caregiver make decisions about treatment or the need for additional tests. TREATMENT You need to stay well hydrated. Drink frequently but in small amounts.You may wish to drink water, sports drinks, clear broth, or eat frozen ice pops or gelatin dessert to help stay hydrated.When you eat, eating slowly may help prevent nausea.There are also some antinausea medicines that may help prevent nausea. HOME CARE INSTRUCTIONS   Take all medicine as directed by your caregiver.  If you do not have an appetite, do not force yourself to eat. However, you must continue to drink fluids.  If you have an appetite, eat a normal diet unless your caregiver tells you differently.  Eat a variety of complex carbohydrates (rice, wheat, potatoes, bread), lean meats, yogurt, fruits, and vegetables.  Avoid high-fat foods because they are more difficult to digest.  Drink enough water and fluids to keep your urine clear or pale yellow.  If you are dehydrated, ask your caregiver for specific rehydration instructions. Signs of dehydration may include:  Severe thirst.  Dry lips and mouth.  Dizziness.  Dark urine.  Decreasing urine frequency and amount.  Confusion.  Rapid breathing or pulse. SEEK IMMEDIATE MEDICAL CARE IF:   You have blood or brown flecks (like coffee grounds) in your vomit.  You have black or bloody stools.  You have a severe headache or stiff neck.  You are confused.  You have severe abdominal pain.  You have chest pain or trouble breathing.  You do not urinate at least once every 8 hours.  You develop cold or clammy skin.  You continue to vomit for longer than 24 to 48 hours.  You have  a fever. MAKE SURE YOU:   Understand these instructions.  Will watch your condition.  Will get help right away if you are not doing well or get worse. Document Released: 04/18/2005 Document Revised: 07/11/2011 Document Reviewed:  09/15/2010 [facility] Patient Information 2015 [place], [place]. This information is not intended to replace advice given to you by your health care provider. Make sure you discuss any questions you have with your health care provider.

## 2014-11-27 NOTE — Telephone Encounter (Signed)
PT will need to contact the office for an appt.

## 2014-12-03 ENCOUNTER — Other Ambulatory Visit: Payer: Self-pay | Admitting: Family Medicine

## 2014-12-03 NOTE — Telephone Encounter (Signed)
Will forward to [place]. She removed it from med list yesterday so she may know if patient actually taking it or note.

## 2014-12-05 MED ORDER — DULOXETINE HCL 30 MG PO CPEP: 30.0000 mg | ORAL_CAPSULE | Freq: Every day | ORAL | Status: DC

## 2014-12-05 NOTE — Telephone Encounter (Signed)
Changed rx to 30mg  and refilled for 30 days with 0 refills.

## 2014-12-05 NOTE — Telephone Encounter (Signed)
Called and spoke with pt and she stated that she only wants to take DULoxetine (CYMBALTA) 30 MG capsule and that she was previously taking DULoxetine (CYMBALTA) 30 MG capsule in addition to DULoxetine (CYMBALTA) 60 MG capsule. I removed it from her med list because she said she was no longer taking it at her office visit but I did call and get clarity on this.

## 2014-12-09 ENCOUNTER — Ambulatory Visit (INDEPENDENT_AMBULATORY_CARE_PROVIDER_SITE_OTHER): Payer: 59

## 2014-12-09 ENCOUNTER — Other Ambulatory Visit: Payer: Self-pay | Admitting: Neurological Surgery

## 2014-12-09 DIAGNOSIS — M542 Cervicalgia: Secondary | ICD-10-CM

## 2014-12-09 DIAGNOSIS — Z9889 Other specified postprocedural states: Secondary | ICD-10-CM | POA: Diagnosis not present

## 2014-12-11 ENCOUNTER — Ambulatory Visit (INDEPENDENT_AMBULATORY_CARE_PROVIDER_SITE_OTHER): Payer: 59 | Admitting: Psychiatry

## 2014-12-11 ENCOUNTER — Encounter (HOSPITAL_COMMUNITY): Payer: Self-pay | Admitting: Psychiatry

## 2014-12-11 VITALS — BP 114/81 | HR 102 | Ht 63.0 in | Wt 138.0 lb

## 2014-12-11 DIAGNOSIS — F0630 Mood disorder due to known physiological condition, unspecified: Secondary | ICD-10-CM

## 2014-12-11 DIAGNOSIS — F411 Generalized anxiety disorder: Secondary | ICD-10-CM | POA: Diagnosis not present

## 2014-12-11 DIAGNOSIS — M7918 Myalgia, other site: Secondary | ICD-10-CM

## 2014-12-11 DIAGNOSIS — F4310 Post-traumatic stress disorder, unspecified: Secondary | ICD-10-CM

## 2014-12-11 MED ORDER — CLONAZEPAM 1 MG PO TABS: ORAL_TABLET | ORAL | Status: DC

## 2014-12-11 MED ORDER — DULOXETINE HCL 30 MG PO CPEP: 30.0000 mg | ORAL_CAPSULE | Freq: Every day | ORAL | Status: DC
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
## 2014-12-16 ENCOUNTER — Other Ambulatory Visit: Payer: Self-pay | Admitting: *Deleted

## 2014-12-16 ENCOUNTER — Ambulatory Visit (INDEPENDENT_AMBULATORY_CARE_PROVIDER_SITE_OTHER): Payer: 59 | Admitting: Osteopathic Medicine

## 2014-12-16 ENCOUNTER — Ambulatory Visit: Payer: Self-pay | Admitting: Family Medicine

## 2014-12-16 ENCOUNTER — Encounter: Payer: Self-pay | Admitting: Osteopathic Medicine

## 2014-12-16 ENCOUNTER — Telehealth: Payer: Self-pay

## 2014-12-16 VITALS — BP 113/72 | HR 116 | Ht 63.0 in | Wt 137.0 lb

## 2014-12-16 DIAGNOSIS — K5909 Other constipation: Secondary | ICD-10-CM | POA: Diagnosis not present

## 2014-12-16 DIAGNOSIS — Z7251 High risk heterosexual behavior: Secondary | ICD-10-CM

## 2014-12-16 DIAGNOSIS — T402X5A Adverse effect of other opioids, initial encounter: Secondary | ICD-10-CM

## 2014-12-16 DIAGNOSIS — K649 Unspecified hemorrhoids: Secondary | ICD-10-CM

## 2014-12-16 DIAGNOSIS — A6000 Herpesviral infection of urogenital system, unspecified: Secondary | ICD-10-CM | POA: Diagnosis not present

## 2014-12-16 DIAGNOSIS — R1030 Lower abdominal pain, unspecified: Secondary | ICD-10-CM

## 2014-12-16 DIAGNOSIS — R339 Retention of urine, unspecified: Secondary | ICD-10-CM

## 2014-12-16 DIAGNOSIS — N3010 Interstitial cystitis (chronic) without hematuria: Secondary | ICD-10-CM

## 2014-12-16 DIAGNOSIS — K5903 Drug induced constipation: Secondary | ICD-10-CM | POA: Insufficient documentation

## 2014-12-16 DIAGNOSIS — T8351XA Infection and inflammatory reaction due to indwelling urinary catheter, initial encounter: Secondary | ICD-10-CM

## 2014-12-16 DIAGNOSIS — T83511A Infection and inflammatory reaction due to indwelling urethral catheter, initial encounter: Secondary | ICD-10-CM

## 2014-12-16 DIAGNOSIS — N390 Urinary tract infection, site not specified: Secondary | ICD-10-CM

## 2014-12-16 DIAGNOSIS — Z79899 Other long term (current) drug therapy: Secondary | ICD-10-CM

## 2014-12-16 LAB — COMPREHENSIVE METABOLIC PANEL
ALT: 11 U/L (ref 6–29)
AST: 15 U/L (ref 10–30)
Albumin: 4.4 g/dL (ref 3.6–5.1)
Alkaline Phosphatase: 60 U/L (ref 33–115)
BUN: 22 mg/dL (ref 7–25)
CHLORIDE: 102 mmol/L (ref 98–110)
CO2: 25 mmol/L (ref 20–31)
CREATININE: 0.92 mg/dL (ref 0.50–1.10)
Calcium: 9.6 mg/dL (ref 8.6–10.2)
Glucose, Bld: 80 mg/dL (ref 65–99)
Potassium: 4.5 mmol/L (ref 3.5–5.3)
Sodium: 137 mmol/L (ref 135–146)
TOTAL PROTEIN: 7.1 g/dL (ref 6.1–8.1)
Total Bilirubin: 0.8 mg/dL (ref 0.2–1.2)

## 2014-12-16 LAB — CBC WITH DIFFERENTIAL/PLATELET
Basophils Absolute: 0 10*3/uL (ref 0.0–0.1)
Basophils Relative: 0 % (ref 0–1)
Eosinophils Absolute: 0.4 10*3/uL (ref 0.0–0.7)
Eosinophils Relative: 3 % (ref 0–5)
HEMATOCRIT: 45.6 % (ref 36.0–46.0)
Hemoglobin: 14.9 g/dL (ref 12.0–15.0)
LYMPHS PCT: 19 % (ref 12–46)
Lymphs Abs: 2.4 10*3/uL (ref 0.7–4.0)
MCH: 31.2 pg (ref 26.0–34.0)
MCHC: 32.7 g/dL (ref 30.0–36.0)
MCV: 95.4 fL (ref 78.0–100.0)
MONO ABS: 1.1 10*3/uL — AB (ref 0.1–1.0)
MPV: 9.3 fL (ref 8.6–12.4)
Monocytes Relative: 9 % (ref 3–12)
Neutro Abs: 8.6 10*3/uL — ABNORMAL HIGH (ref 1.7–7.7)
Neutrophils Relative %: 69 % (ref 43–77)
Platelets: 344 10*3/uL (ref 150–400)
RBC: 4.78 MIL/uL (ref 3.87–5.11)
RDW: 13.5 % (ref 11.5–15.5)
WBC: 12.4 10*3/uL — ABNORMAL HIGH (ref 4.0–10.5)

## 2014-12-16 LAB — POCT URINALYSIS DIPSTICK
Bilirubin, UA: NEGATIVE
Glucose, UA: NEGATIVE
KETONES UA: NEGATIVE
Nitrite, UA: POSITIVE
Protein, UA: 30
Spec Grav, UA: 1.025
Urobilinogen, UA: 0.2
pH, UA: 5.5

## 2014-12-16 LAB — POCT URINE PREGNANCY: PREG TEST UR: NEGATIVE

## 2014-12-16 LAB — WET PREP FOR TRICH, YEAST, CLUE
CLUE CELLS WET PREP: NONE SEEN
Trich, Wet Prep: NONE SEEN
YEAST WET PREP: NONE SEEN

## 2014-12-16 LAB — RPR

## 2014-12-16 MED ORDER — ACYCLOVIR 400 MG PO TABS: 400.0000 mg | ORAL_TABLET | Freq: Three times a day (TID) | ORAL | Status: DC

## 2014-12-16 MED ORDER — SULFAMETHOXAZOLE-TRIMETHOPRIM 800-160 MG PO TABS: 1.0000 | ORAL_TABLET | Freq: Two times a day (BID) | ORAL | Status: DC

## 2014-12-16 MED ORDER — STARCH 51 % RE SUPP: 1.0000 | RECTAL | Status: DC | PRN

## 2014-12-16 MED ORDER — NALOXEGOL OXALATE 12.5 MG PO TABS: 12.5000 mg | ORAL_TABLET | Freq: Every day | ORAL | Status: DC

## 2014-12-16 MED ORDER — HYDROCORTISONE ACETATE 25 MG RE SUPP: 25.0000 mg | Freq: Two times a day (BID) | RECTAL | Status: DC

## 2014-12-16 NOTE — Telephone Encounter (Signed)
Patient is taking;  Flexeril 10 mg tid Clonazepam 1 mg 1/2 tablet bid Cymbalta 30 mg daily Mobic 15 mg daily Lunesta 2 mg qhs prn Metoprolol 50 mg daily  She would like Amber to call her back.

## 2014-12-16 NOTE — Telephone Encounter (Signed)
LMOM for pt to return call. 

## 2014-12-16 NOTE — Progress Notes (Signed)
Chief complaint: bladder pressure and sti check, exposure to HSV  HPI: [name] is a 39 y.o. female who presents to [place]  today for STI check.   Exposure to genital herpes, has 1 sore on vagina. Exposure from boyfriend, she is monogamous, he has known about his infection however failed to  Inform Her of this.  Onset of sore few days ago, worsening now, has tried Lidocaine to some benefit, burning pain, nonradiating.  She Has been on Valtrex before for Shingles and did not tolerate this medication, "made me feel loopy and achy" requests alternative if needed.   Follows with Urology for Interstitial cystitis for about 4 years last seen there 5 days ago - culture was negative.  No treatment for past 2 years - recently restarted with [place] and prn Lidocaine - did a treatment yesterday and day before.  She complains of bladder pressure and feeling like she cannot empty her bladder. She has been self-catheterizing at home since last week.   Medication list reviewed - patient does not have a list with her today but she notes several new medicines she's not sure about dosing or frequency. Recent neck surgery and she is on opiate pain meds requesting refill of medication used for opiate-induced constipation. She is being treated with bladder instillations via cath by urology but these medications aren't on record either.    Past Medical History  Diagnosis Date  . Anxiety   . Headache(784.0)   . IBS (irritable bowel syndrome)     questionable, per pt; saw Dr. Collene Mares  . IC (interstitial cystitis)   . Insomnia   . Asthma   . Hypertension     no longer takes meds for HTN  . Arthritis   . Fibromyalgia   . Elevated heart rate and blood pressure     takes toprol for increased heart rate  . Complication of anesthesia   . PONV (postoperative nausea and vomiting)    Past Surgical History  Procedure Laterality Date  . Cesarean section    .  Pelvic laparoscopy  2003    endometriosis  . Ovarian cyst removal    . Dilation and evacuation N/A 05/07/2013    Procedure: DILATATION AND EVACUATION with tissue sent for chromosome analysis;  Surgeon: Lovenia Kim, MD;  Location: [facility];  Service: Gynecology;  Laterality: N/A;  . Cervical spine surgery  8/15  . Carpal tunnel release Left 06/05/2014    Procedure: LEFT CARPAL TUNNEL RELEASE;  Surgeon: Daryll Brod, MD;  Location: [facility];  Service: Orthopedics;  Laterality: Left;  . Robotic assisted laparoscopic lysis of adhesion N/A 07/18/2014    Procedure: ROBOTIC ASSISTED LAPAROSCOPIC  EXCISION POSTERIOR  UTERINE WALL MASS; EXCISION RIGHT MESSALEINGEAL MASS, EXCISION AND ABLATION CULDASAC ENDOMETRIOSIS;  Surgeon: Brien Few, MD;  Location: [facility];  Service: Gynecology;  Laterality: N/A;  . Carpal tunnel release Right 08/25/2014    Procedure: RIGHT CARPAL TUNNEL RELEASE;  Surgeon: Daryll Brod, MD;  Location: [facility];  Service: Orthopedics;  Laterality: Right;  . Posterior cervical fusion/foraminotomy N/A 10/28/2014    Procedure: Cervical Four-Cervical Seven Posterior cervical fusion with lateral mass fixation;  Surgeon: Eustace Moore, MD;  Location: [facility];  Service: Neurosurgery;  Laterality: N/A;  posterior   Social History  Substance Use Topics  . Smoking status: Current Some Day Smoker -- 0.50 packs/day for 18 years    Types: Cigarettes  . Smokeless tobacco: Never  Used  . Alcohol Use: No   Medications: Current Outpatient Prescriptions  Medication Sig Dispense Refill  . albuterol (PROVENTIL HFA;VENTOLIN HFA) 108 (90 BASE) MCG/ACT inhaler Inhale 2 puffs into the lungs every 4 (four) hours as needed for wheezing. (Patient taking differently: Inhale 2 puffs into the lungs every 4 (four) hours as needed for wheezing or shortness of breath (bronchitis). ) 1 Inhaler 1  . clonazePAM (KLONOPIN) 1 MG tablet Take half twice a day prn 30 tablet 1  .  DULoxetine (CYMBALTA) 30 MG capsule Take 1 capsule (30 mg total) by mouth daily. 30 capsule 1  . eszopiclone (LUNESTA) 2 MG TABS tablet Take 2 mg by mouth at bedtime. Take immediately before bedtime    . metoCLOPramide (REGLAN) 5 MG tablet Take 1 tablet (5 mg total) by mouth every 8 (eight) hours as needed for nausea or vomiting. 40 tablet 0  . metoprolol succinate (TOPROL-XL) 50 MG 24 hr tablet Take 1 tablet (50 mg total) by mouth daily. Take with or immediately following a meal. 90 tablet 1  . promethazine (PHENERGAN) 25 MG suppository Place 1 suppository (25 mg total) rectally every 6 (six) hours as needed for nausea or vomiting. 12 each 0   No current facility-administered medications for this visit.   Allergies  Allergen Reactions  . Iodinated Diagnostic Agents Rash  . Gadolinium Derivatives Hives, Itching and Other (See Comments)    After MRI with Multihance finished, patient had redness and itching on chest, tightness in throat.  Patient went to ED to be monitored (04/23/14).  . Hydrocodone-Acetaminophen Itching    Tolerates with benadryl (no reaction to oxycodone)  . Tramadol Itching  . Amitriptyline Other (See Comments)    Gained weight and does not want to be on.   . Eggs Or Egg-Derived Products Rash     Review of Systems: CONSTITUTIONAL: Neg fever/chills, no unintentional weight changes HEAD/EYES/EARS/NOSE: No headache/vision change/heraing change CARDIAC: No chest pain/pressure/palpitations, no orthopnea RESPIRATORY: No cough/shortness of breath/wheeze GASTROINTESTINAL: No nausea/vomiting/abdominal pain/blood in stool/diarrhea/constipation. (+) hemorrhoid discomfort MUSCULOSKELETAL: No myalgia/arthralgia GENITOURINARY: No incontinence, No abnormal genital bleeding/discharge. (+) genital ulcer as noted in HPI. (+) urinary retention and recent urology workup as noted in HPI.  SKIN: No rash/wounds/concerning lesions HEM/ONC: No easy bruising/bleeding, no abnormal lymph node     Exam:  LMP 11/18/2014 Constitutional: VSS, see nurse notes. General Appearance: alert, well-developed, well-nourished, NAD Eyes: Normal lids and conjunctive, non-icteric sclera Respiratory: Normal respiratory effort. No dullness/hyper-resonance to percyeahussion. Breath sounds normal, no wheeze/rhonchi/rales Cardiovascular: S1/S2 normal, no murmur/rub/gallop auscultated. No carotid bruit or JVD. Pedal pulse II/IV bilaterally DP and PT. No lower extremity edema Gastrointestinal: Nontender, no masses. No hepatomegaly, no splenomegaly. No hernia appreciated Musculoskeletal: Gait normal. No clubbing/cyanosis of digits GYN: No lesions/ulcers to external genitalia, normal urethra, normal vaginal mucosa, physiologic discharge, cervix normal without lesions, uterus not enlarged or tender, adnexa no masses and nontender. Bladder palpable and was catheterized to reveal cloudy urine app Neurological: No cranial nerve deficit on limited exam. Motor and sensation intact and symmetric Psychiatric: Normal judgment/insight. Anxious mood and affect   No results found for this or any previous visit (from the past 24 hour(s)). No results found.   ASSESSMENT/PLAN: Please see individual assessment and plan sections for details and orders.  Summary and/or additional information as follows:  Unprotected sexual intercourse - Plan: WET PREP FOR [place], YEAST, CLUE, GC/Chlamydia Probe Amp, Viral culture, POCT urine pregnancy, HIV antibody (with reflex), RPR  Suprapubic pain, unspecified laterality - Plan:  POCT urinalysis dipstick, Urine Culture, f/u Urology  Therapeutic opioid induced constipation - Plan: naloxegol oxalate (MOVANTIK) 12.5 MG TABS tablet, need list of her meds  Chronic interstitial cystitis - Plan: Urine Culture, patient is encouraged to contact her urologist and informed them of her current symptoms, prior cathete UTI and catheter associated and she may be exacerbating this problem  Genital  HSV - Plan: acyclovir (ZOVIRAX) 400 MG tablet, educated on treatment of exacerbations, refill of medication given when necessary for recurrence, will hold off on prophylactic medication for now given this is her first outbreak. Patient has been educated on significant possible side effects of medication and is instructed to contact me or other medical professional with any concerns about side effects.   Medication management - Plan: Comprehensive Metabolic Panel (CMET), CBC with Differential. Patient is instructed to provide our clinic with a complete list of every medication she is taking, which Physicianis prescribing it, dose and frequency. Advised dangers of PCP not knowing full list of meds - bring list and/or the physical medications to all appointments.   Urinary tract infection associated with catheterization of urinary tract, initial encounter - Plan: sulfamethoxazole-trimethoprim (BACTRIM DS,SEPTRA DS) 800-160 MG per tablet, f/u with urology, UCx pending  Hemorrhoids, unspecified hemorrhoid type - Plan: Anusol suppository prn pain

## 2014-12-16 NOTE — Patient Instructions (Signed)
lowpain

## 2014-12-17 LAB — HIV ANTIBODY (ROUTINE TESTING W REFLEX): HIV 1&2 Ab, 4th Generation: NONREACTIVE

## 2014-12-17 LAB — GC/CHLAMYDIA PROBE AMP
CT Probe RNA: NEGATIVE
GC Probe RNA: NEGATIVE

## 2014-12-18 ENCOUNTER — Telehealth: Payer: Self-pay

## 2014-12-18 LAB — URINE CULTURE

## 2014-12-18 LAB — VIRAL CULTURE VIRC

## 2014-12-18 NOTE — Telephone Encounter (Signed)
[facility] called stating that the viral culture could not be performed due to the sample being from the genital area. I requested an add on herpes culture to the specimen ID [id number]. This will check for herpes and result the type if the virus is detected. Thank you.

## 2014-12-22 ENCOUNTER — Other Ambulatory Visit: Payer: Self-pay | Admitting: Sports Medicine

## 2014-12-22 LAB — HERPES SIMPLEX VIRUS CULTURE: ORGANISM ID, BACTERIA: NOT DETECTED

## 2014-12-26 ENCOUNTER — Ambulatory Visit: Payer: Self-pay | Admitting: Physician Assistant

## 2014-12-29 ENCOUNTER — Ambulatory Visit (INDEPENDENT_AMBULATORY_CARE_PROVIDER_SITE_OTHER): Payer: 59 | Admitting: Physician Assistant

## 2014-12-29 ENCOUNTER — Encounter: Payer: Self-pay | Admitting: Physician Assistant

## 2014-12-29 ENCOUNTER — Ambulatory Visit: Payer: Self-pay | Admitting: Physician Assistant

## 2014-12-29 VITALS — BP 100/66 | HR 91 | Ht 63.0 in | Wt 137.0 lb

## 2014-12-29 DIAGNOSIS — F411 Generalized anxiety disorder: Secondary | ICD-10-CM | POA: Diagnosis not present

## 2014-12-29 DIAGNOSIS — M5030 Other cervical disc degeneration, unspecified cervical region: Secondary | ICD-10-CM | POA: Diagnosis not present

## 2014-12-29 DIAGNOSIS — R300 Dysuria: Secondary | ICD-10-CM

## 2014-12-29 DIAGNOSIS — R000 Tachycardia, unspecified: Secondary | ICD-10-CM

## 2014-12-29 DIAGNOSIS — G4700 Insomnia, unspecified: Secondary | ICD-10-CM | POA: Diagnosis not present

## 2014-12-29 LAB — POCT URINALYSIS DIPSTICK
GLUCOSE UA: NEGATIVE
KETONES UA: NEGATIVE
LEUKOCYTES UA: NEGATIVE
NITRITE UA: NEGATIVE
PH UA: 6
Protein, UA: NEGATIVE
Spec Grav, UA: >=1.03
Urobilinogen, UA: 0.2

## 2014-12-29 MED ORDER — METOPROLOL SUCCINATE ER 50 MG PO TB24: 50.0000 mg | ORAL_TABLET | Freq: Every day | ORAL | Status: DC

## 2014-12-29 NOTE — Progress Notes (Signed)
   Subjective:    Patient ID: [name], female    DOB: June 21, 1975, 39 y.o.   MRN: [id number]  HPI  Pt presents to the clinic to discuss increased heart rate. She feel like heart rate has been increasing since Dr. Pricilla Handler decreasing [place]. HR between 90-110. She does feel like she has occasional palpitations despite metoprolol use. She continues to have problems sleeping. No SOB, CP. She feels like she is improving. DDD, cervical is much better after fusion in June. She is still having some muscle tightness around incision but radicular symptoms are much improved.   As walking out door. Pt having some dysuria. Recent hx of UTI. No fever, chills, abdominal pain, flank pain. Not tried anything to make better.       Review of Systems  All other systems reviewed and are negative.      Objective:   Physical Exam  Constitutional: She is oriented to person, place, and time. She appears well-developed and well-nourished.  HENT:  Head: Normocephalic and atraumatic.  Cardiovascular: Normal rate, regular rhythm and normal heart sounds.   Pulmonary/Chest: Effort normal and breath sounds normal.  Musculoskeletal:  No tenderness over c-spine. Some muscle tightness and tenderness over left neck muscles.   Neurological: She is alert and oriented to person, place, and time.  Skin: Skin is dry.  Psychiatric: She has a normal mood and affect. Her behavior is normal.          Assessment & Plan:  Tachycardia/GAD/insomnia- reassured patient that examining her records no worrisome high readings. Highest she has captured was 110. She is on metoprolol 50mg  and do not want to increase due to low BP. I do think when anxiety increases her HR follows which is to be expected. Will get holter monitor to evaluate heart. Continue klonapin as directed only as needed. Stay on cymbalta. lunesta for insomnia. Follow up after holter results.   Cervical DDD- Dr. Ronnald Ramp managing. Fusion improved  symptoms.   Dysuria- recent UTI. Will check to see if cleared.  .. Results for orders placed or performed in visit on 12/29/14  POCT Urinalysis Dipstick  Result Value Ref Range   Color, UA Blue    Clarity, UA Clear    Glucose, UA neg    Bilirubin, UA mod    Ketones, UA neg    Spec Grav, UA >=1.030    Blood, UA trace    pH, UA 6.0    Protein, UA neg    Urobilinogen, UA 0.2    Nitrite, UA neg    Leukocytes, UA Negative Negative   Will culture.

## 2014-12-29 NOTE — Patient Instructions (Signed)
holter monitor

## 2014-12-31 ENCOUNTER — Other Ambulatory Visit: Payer: Self-pay | Admitting: Physician Assistant

## 2014-12-31 DIAGNOSIS — R000 Tachycardia, unspecified: Secondary | ICD-10-CM

## 2014-12-31 LAB — URINE CULTURE
COLONY COUNT: NO GROWTH
Organism ID, Bacteria: NO GROWTH

## 2015-01-05 ENCOUNTER — Encounter: Payer: Self-pay | Admitting: Physician Assistant

## 2015-01-07 ENCOUNTER — Other Ambulatory Visit: Payer: Self-pay | Admitting: Physician Assistant

## 2015-01-07 MED ORDER — CLONAZEPAM 1 MG PO TABS: ORAL_TABLET | ORAL | Status: DC

## 2015-01-13 ENCOUNTER — Telehealth: Payer: Self-pay | Admitting: *Deleted

## 2015-01-13 DIAGNOSIS — R002 Palpitations: Secondary | ICD-10-CM

## 2015-01-13 DIAGNOSIS — R000 Tachycardia, unspecified: Secondary | ICD-10-CM

## 2015-01-13 NOTE — Telephone Encounter (Signed)
Correct holter monitor ordered.

## 2015-01-19 ENCOUNTER — Encounter: Payer: Self-pay | Admitting: Physician Assistant

## 2015-01-19 DIAGNOSIS — R000 Tachycardia, unspecified: Secondary | ICD-10-CM

## 2015-01-22 ENCOUNTER — Ambulatory Visit (INDEPENDENT_AMBULATORY_CARE_PROVIDER_SITE_OTHER): Payer: 59

## 2015-01-22 DIAGNOSIS — R002 Palpitations: Secondary | ICD-10-CM

## 2015-01-22 DIAGNOSIS — R000 Tachycardia, unspecified: Secondary | ICD-10-CM

## 2015-01-28 ENCOUNTER — Encounter: Payer: Self-pay | Admitting: Physician Assistant

## 2015-01-28 NOTE — Telephone Encounter (Signed)
Heart monitor results: sinus tachycardia at times, one PVC. If symptoms are bad we can increase metoprolol.

## 2015-02-03 ENCOUNTER — Other Ambulatory Visit: Payer: Self-pay | Admitting: Neurological Surgery

## 2015-02-03 ENCOUNTER — Ambulatory Visit (INDEPENDENT_AMBULATORY_CARE_PROVIDER_SITE_OTHER): Payer: 59

## 2015-02-03 DIAGNOSIS — M4803 Spinal stenosis, cervicothoracic region: Secondary | ICD-10-CM

## 2015-02-03 DIAGNOSIS — M542 Cervicalgia: Secondary | ICD-10-CM

## 2015-02-05 ENCOUNTER — Ambulatory Visit (HOSPITAL_COMMUNITY): Payer: Self-pay | Admitting: Psychiatry

## 2015-02-11 ENCOUNTER — Encounter: Payer: Self-pay | Admitting: Physician Assistant

## 2015-03-10 ENCOUNTER — Other Ambulatory Visit: Payer: Self-pay | Admitting: Neurological Surgery

## 2015-03-10 DIAGNOSIS — M542 Cervicalgia: Secondary | ICD-10-CM

## 2015-03-10 NOTE — Patient Instructions (Signed)
Your procedure is scheduled on:  Friday, Nov. 25, 2016  Enter through the [employer] [facility] at:  10:45 A.M.  Pick up the phone at the desk and dial [phone].  Call this number if you have problems the morning of surgery: [phone].  Remember: Do NOT eat food:  After midnight Thursday, Nov. 24, 2016 Do NOT drink clear liquids after: 8:00 A.M.  DAY OF SURGERY Take these medicines the morning of surgery with a SIP OF WATER:  METOPROLOL, CLONAZEPAM, CYMBALTA  *BRING ASTHMA INHALER DAY OF SURGERY  Do NOT wear jewelry (body piercing), metal hair clips/bobby pins, make-up, or nail polish. Do NOT wear lotions, powders, or perfumes.  You may wear deoderant. Do NOT shave for 48 hours prior to surgery. Do NOT bring valuables to the hospital. Contacts, dentures, or bridgework may not be worn into surgery. Leave suitcase in car.  After surgery it may be brought to your room.  For patients admitted to the hospital, checkout time is 11:00 AM the day of discharge.

## 2015-03-11 ENCOUNTER — Inpatient Hospital Stay (HOSPITAL_COMMUNITY)
Admission: RE | Admit: 2015-03-11 | Discharge: 2015-03-11 | Disposition: A | Discharge status: Home / Self Care | Payer: Self-pay | Source: Ambulatory Visit

## 2015-03-11 ENCOUNTER — Other Ambulatory Visit: Payer: Self-pay | Admitting: Obstetrics and Gynecology

## 2015-03-13 ENCOUNTER — Encounter (HOSPITAL_COMMUNITY)
Admission: RE | Admit: 2015-03-13 | Discharge: 2015-03-13 | Disposition: A | Discharge status: Home / Self Care | Payer: Medicaid Other | Source: Ambulatory Visit | Attending: Obstetrics and Gynecology | Admitting: Obstetrics and Gynecology

## 2015-03-13 ENCOUNTER — Encounter (HOSPITAL_COMMUNITY): Payer: Self-pay

## 2015-03-13 DIAGNOSIS — Z01818 Encounter for other preprocedural examination: Secondary | ICD-10-CM | POA: Diagnosis present

## 2015-03-13 HISTORY — DX: Cardiac arrhythmia, unspecified: I49.9

## 2015-03-13 LAB — CBC
HEMATOCRIT: 45.7 % (ref 36.0–46.0)
HEMOGLOBIN: 15.3 g/dL — AB (ref 12.0–15.0)
MCH: 32.3 pg (ref 26.0–34.0)
MCHC: 33.5 g/dL (ref 30.0–36.0)
MCV: 96.4 fL (ref 78.0–100.0)
Platelets: 233 10*3/uL (ref 150–400)
RBC: 4.74 MIL/uL (ref 3.87–5.11)
RDW: 13.6 % (ref 11.5–15.5)
WBC: 14 10*3/uL — AB (ref 4.0–10.5)

## 2015-03-13 LAB — BASIC METABOLIC PANEL
ANION GAP: 8 (ref 5–15)
BUN: 16 mg/dL (ref 6–20)
CHLORIDE: 101 mmol/L (ref 101–111)
CO2: 28 mmol/L (ref 22–32)
Calcium: 9.5 mg/dL (ref 8.9–10.3)
Creatinine, Ser: 0.93 mg/dL (ref 0.44–1.00)
GFR calc non Af Amer: >60 mL/min (ref >60)
Glucose, Bld: 95 mg/dL (ref 65–99)
Potassium: 4.3 mmol/L (ref 3.5–5.1)
SODIUM: 137 mmol/L (ref 135–145)

## 2015-03-13 NOTE — Patient Instructions (Addendum)
Your procedure is scheduled on:  Friday, Nov. 25, 2016  Enter through the Main Entrance of [facility] at: 10:45 a.m.  Pick up the phone at the desk and dial [phone].  Call this number if you have problems the morning of surgery: [phone].  Remember: Do NOT eat food:  After midnight Thursday, Mar 26, 2015 Do NOT drink clear liquids after:  8:00 AM day of surgery Take these medicines the morning of surgery with a SIP OF WATER:  Clonazepam, Toprol, Cymbalta  Do NOT wear jewelry (body piercing), metal hair clips/bobby pins, make-up, or nail polish. Do NOT wear lotions, powders, or perfumes.  You may wear deoderant. Do NOT shave for 48 hours prior to surgery. Do NOT bring valuables to the hospital. Contacts, dentures, or bridgework may not be worn into surgery. Leave suitcase in car.  After surgery it may be brought to your room.  For patients admitted to the hospital, checkout time is 11:00 AM the day of discharge.

## 2015-03-20 ENCOUNTER — Other Ambulatory Visit: Payer: Self-pay | Admitting: *Deleted

## 2015-03-20 ENCOUNTER — Ambulatory Visit
Admission: RE | Admit: 2015-03-20 | Discharge: 2015-03-20 | Disposition: A | Discharge status: Home / Self Care | Payer: Medicaid Other | Source: Ambulatory Visit | Attending: Neurological Surgery | Admitting: Neurological Surgery

## 2015-03-20 DIAGNOSIS — M542 Cervicalgia: Secondary | ICD-10-CM

## 2015-03-20 HISTORY — PX: OTHER SURGICAL HISTORY: SHX169

## 2015-03-20 MED ORDER — IOHEXOL 300 MG/ML  SOLN
1.0000 mL | Freq: Once | INTRAMUSCULAR | Status: DC | PRN
  Administered 2015-03-20: 1 mL via EPIDURAL

## 2015-03-20 MED ORDER — METOPROLOL SUCCINATE ER 50 MG PO TB24: 50.0000 mg | ORAL_TABLET | Freq: Every day | ORAL | Status: DC

## 2015-03-20 MED ORDER — TRIAMCINOLONE ACETONIDE 40 MG/ML IJ SUSP (RADIOLOGY)
60.0000 mg | Freq: Once | INTRAMUSCULAR | Status: AC
  Administered 2015-03-20: 60 mg via EPIDURAL

## 2015-03-20 NOTE — Discharge Instructions (Signed)

## 2015-04-01 ENCOUNTER — Other Ambulatory Visit: Payer: Self-pay | Admitting: *Deleted

## 2015-04-01 MED ORDER — METOPROLOL SUCCINATE ER 100 MG PO TB24: 100.0000 mg | ORAL_TABLET | Freq: Every day | ORAL | Status: DC

## 2015-04-06 ENCOUNTER — Telehealth: Payer: Self-pay | Admitting: Physician Assistant

## 2015-04-06 NOTE — Telephone Encounter (Signed)
Pt needs letter than will continue to care for her with medicaid.

## 2015-05-05 ENCOUNTER — Telehealth: Payer: Self-pay

## 2015-05-05 NOTE — Telephone Encounter (Signed)
Ok for refill? 

## 2015-05-05 NOTE — Telephone Encounter (Signed)
Refill request from [facility] for Clonazepam 1mg , take 1 tablet by mouth twice daily.  Patient has also seen behavioral health.  Please advise.

## 2015-05-06 ENCOUNTER — Encounter (HOSPITAL_COMMUNITY)
Admission: RE | Admit: 2015-05-06 | Discharge: 2015-05-06 | Disposition: A | Discharge status: Home / Self Care | Payer: Medicaid Other | Source: Ambulatory Visit | Attending: Obstetrics and Gynecology | Admitting: Obstetrics and Gynecology

## 2015-05-06 ENCOUNTER — Encounter (HOSPITAL_COMMUNITY): Payer: Self-pay

## 2015-05-06 DIAGNOSIS — N809 Endometriosis, unspecified: Secondary | ICD-10-CM | POA: Diagnosis not present

## 2015-05-06 DIAGNOSIS — N946 Dysmenorrhea, unspecified: Secondary | ICD-10-CM | POA: Diagnosis not present

## 2015-05-06 DIAGNOSIS — Z01818 Encounter for other preprocedural examination: Secondary | ICD-10-CM | POA: Diagnosis present

## 2015-05-06 LAB — BASIC METABOLIC PANEL
Anion gap: 8 (ref 5–15)
BUN: 19 mg/dL (ref 6–20)
CO2: 26 mmol/L (ref 22–32)
Calcium: 9.4 mg/dL (ref 8.9–10.3)
Chloride: 104 mmol/L (ref 101–111)
Creatinine, Ser: 0.85 mg/dL (ref 0.44–1.00)
GFR calc Af Amer: >60 mL/min (ref >60)
GLUCOSE: 98 mg/dL (ref 65–99)
POTASSIUM: 3.8 mmol/L (ref 3.5–5.1)
Sodium: 138 mmol/L (ref 135–145)

## 2015-05-06 LAB — CBC
HCT: 44.4 % (ref 36.0–46.0)
HEMOGLOBIN: 15.1 g/dL — AB (ref 12.0–15.0)
MCH: 33.3 pg (ref 26.0–34.0)
MCHC: 34 g/dL (ref 30.0–36.0)
MCV: 98 fL (ref 78.0–100.0)
Platelets: 305 10*3/uL (ref 150–400)
RBC: 4.53 MIL/uL (ref 3.87–5.11)
RDW: 13 % (ref 11.5–15.5)
WBC: 10.3 10*3/uL (ref 4.0–10.5)

## 2015-05-06 LAB — TYPE AND SCREEN
ABO/RH(D): O POS
Antibody Screen: NEGATIVE

## 2015-05-06 LAB — ABO/RH: ABO/RH(D): O POS

## 2015-05-06 MED ORDER — CLONAZEPAM 1 MG PO TABS: ORAL_TABLET | ORAL | Status: DC

## 2015-05-06 MED FILL — clonazePAM 1 MG TABS: 1 | 30 days supply | Qty: 60 | Fill #0

## 2015-05-06 MED FILL — TRANEXAMIC ACID 650 MG TAB: 650 | 28 days supply | Qty: 30 | Fill #1

## 2015-05-06 NOTE — Telephone Encounter (Signed)
Rx printed and faxed.  

## 2015-05-06 NOTE — Patient Instructions (Addendum)
Your procedure is scheduled on:05/11/15  Enter through the Main Entrance at :12:15 pm Pick up desk phone and dial [phone] and inform us of your arrival.  Please call [phone] if you have any problems the morning of surgery.  Remember: Do not eat food after midnight:Sunday Clear liquids are ok until:9:30 am Monday   You may brush your teeth the morning of surgery.  Take these meds the morning of surgery with a sip of water:all usual am meds  DO NOT wear jewelry, eye make-up, lipstick,body lotion, or dark fingernail polish.  (Polished toes are ok) You may wear deodorant.  If you are to be admitted after surgery, leave suitcase in car until your room has been assigned. Patients discharged on the day of surgery will not be allowed to drive home. Wear loose fitting, comfortable clothes for your ride home.

## 2015-05-07 ENCOUNTER — Other Ambulatory Visit: Payer: Self-pay | Admitting: Neurological Surgery

## 2015-05-07 MED FILL — CYCLOBENZAPRINE 10 MG TAB: 10 | 30 days supply | Qty: 90 | Fill #0

## 2015-05-07 MED FILL — METOPROLOL SUCC ER 100 MG T: 100 | 30 days supply | Qty: 30 | Fill #0

## 2015-05-07 MED FILL — MELOXICAM 15 MG TABLET: 15 | 30 days supply | Qty: 30 | Fill #3

## 2015-05-07 MED FILL — ZOLPIDEM TARTRATE 10 MG TAB: 10 | 30 days supply | Qty: 30 | Fill #2

## 2015-05-08 ENCOUNTER — Other Ambulatory Visit: Payer: Self-pay | Admitting: Neurological Surgery

## 2015-05-08 DIAGNOSIS — M542 Cervicalgia: Secondary | ICD-10-CM

## 2015-05-08 MED FILL — oxyCODONE HCL 5 MG TABS: 5 | 10 days supply | Qty: 40 | Fill #0

## 2015-05-10 NOTE — H&P (Signed)
[name], [name] NO.:  [id number]  MEDICAL RECORD NO.:  [id number]  LOCATION:                                 FACILITY:  PHYSICIAN:  Lovenia Kim, M.D.DATE OF BIRTH:  1976/02/09  DATE OF ADMISSION: DATE OF DISCHARGE:                             [facility], May 11, 2015.  CHIEF COMPLAINT:  Pelvic pain, dysmenorrhea recurrence for definitive therapy.  HISTORY OF PRESENT ILLNESS:  A 40 year old white female, G3, P2, history of laparoscopic lysis of adhesions, and fulguration of endometriosis in 2004 and in 2016, who presents now for worsening pelvic pain and discomfort for definitive therapy.  ALLERGIES:  She has no known drug allergies.  MEDICATIONS:  Hydrocodone as needed, Lysteda as needed, promethazine, prenatal vitamins, meloxicam, Cymbalta, Klonopin, Flexeril, Elmiron, Lunesta, Toprol, and acyclovir.  PAST MEDICAL PROBLEMS:  The medical problems to include history of depressive illness, history of endometriosis, history of unexplained nausea, history of asthma.  FAMILY HISTORY:  Diabetes, breast cancer, hypertension, and myocardial infarction.  PHYSICAL EXAMINATION:  GENERAL:  She is a well-developed, well- nourished, white female, in no acute distress. HEENT:  Normal. NECK:  Supple.  Full range of motion. LUNGS:  Clear. HEART:  Regular rate and rhythm. ABDOMEN:  Soft, nontender. PELVIC:  Reveals an anteflexed uterus and no adnexal masses. EXTREMITIES:  There are no cords. NEUROLOGIC:  Nonfocal. SKIN:  Intact.  IMPRESSION:  Refractory recurrent dysmenorrhea, menorrhagia, and pelvic pain with a history of pelvic endometriosis.  PLAN:  Proceed with da Vinci assisted total laparoscopic hysterectomy and bilateral salpingectomy.  Risks of anesthesia, infection, bleeding, injury to surrounding organs with possible need for repair was discussed, delayed versus immediate complications to include bowel  and bladder injury noted.  The patient acknowledges and wishes to proceed.     Lovenia Kim, M.D.     RJT/MEDQ  D:  05/10/2015  T:  05/10/2015  Job:  [id number]

## 2015-05-11 ENCOUNTER — Encounter (HOSPITAL_COMMUNITY)
Admission: RE | Disposition: A | Discharge status: Home / Self Care | Payer: Self-pay | Source: Ambulatory Visit | Attending: Obstetrics and Gynecology

## 2015-05-11 ENCOUNTER — Ambulatory Visit (HOSPITAL_COMMUNITY): Payer: Medicaid Other | Admitting: Anesthesiology

## 2015-05-11 ENCOUNTER — Encounter (HOSPITAL_COMMUNITY): Payer: Self-pay | Admitting: Emergency Medicine

## 2015-05-11 ENCOUNTER — Ambulatory Visit (HOSPITAL_COMMUNITY)
Admission: RE | Admit: 2015-05-11 | Discharge: 2015-05-12 | Disposition: A | Discharge status: Home / Self Care | Payer: Medicaid Other | Source: Ambulatory Visit | Attending: Obstetrics and Gynecology | Admitting: Obstetrics and Gynecology

## 2015-05-11 DIAGNOSIS — I10 Essential (primary) hypertension: Secondary | ICD-10-CM | POA: Insufficient documentation

## 2015-05-11 DIAGNOSIS — N946 Dysmenorrhea, unspecified: Secondary | ICD-10-CM | POA: Diagnosis present

## 2015-05-11 DIAGNOSIS — R102 Pelvic and perineal pain: Secondary | ICD-10-CM | POA: Insufficient documentation

## 2015-05-11 DIAGNOSIS — F17210 Nicotine dependence, cigarettes, uncomplicated: Secondary | ICD-10-CM | POA: Insufficient documentation

## 2015-05-11 DIAGNOSIS — M797 Fibromyalgia: Secondary | ICD-10-CM | POA: Diagnosis not present

## 2015-05-11 DIAGNOSIS — Z23 Encounter for immunization: Secondary | ICD-10-CM | POA: Insufficient documentation

## 2015-05-11 DIAGNOSIS — N801 Endometriosis of ovary: Secondary | ICD-10-CM | POA: Insufficient documentation

## 2015-05-11 DIAGNOSIS — N920 Excessive and frequent menstruation with regular cycle: Secondary | ICD-10-CM | POA: Insufficient documentation

## 2015-05-11 DIAGNOSIS — K589 Irritable bowel syndrome without diarrhea: Secondary | ICD-10-CM | POA: Diagnosis not present

## 2015-05-11 DIAGNOSIS — N809 Endometriosis, unspecified: Secondary | ICD-10-CM | POA: Diagnosis not present

## 2015-05-11 HISTORY — PX: ROBOTIC ASSISTED TOTAL HYSTERECTOMY WITH SALPINGECTOMY: SHX6679

## 2015-05-11 LAB — HCG, SERUM, QUALITATIVE: Preg, Serum: NEGATIVE

## 2015-05-11 SURGERY — ROBOTIC ASSISTED TOTAL HYSTERECTOMY WITH SALPINGECTOMY
Anesthesia: General | Site: Abdomen | Laterality: Bilateral

## 2015-05-11 MED ORDER — ROPIVACAINE HCL 5 MG/ML IJ SOLN
INTRAMUSCULAR | Status: AC
Start: 2015-05-11 — End: 2015-05-11
  Filled 2015-05-11: qty 60

## 2015-05-11 MED ORDER — DIPHENHYDRAMINE HCL 50 MG/ML IJ SOLN
INTRAMUSCULAR | Status: AC
  Administered 2015-05-11: 12.5 mg via INTRAVENOUS
  Filled 2015-05-11: qty 1

## 2015-05-11 MED ORDER — LACTATED RINGERS IR SOLN
Status: DC | PRN
  Administered 2015-05-11: 3000 mL

## 2015-05-11 MED ORDER — DEXAMETHASONE SODIUM PHOSPHATE 10 MG/ML IJ SOLN
INTRAMUSCULAR | Status: DC | PRN
  Administered 2015-05-11: 4 mg via INTRAVENOUS

## 2015-05-11 MED ORDER — BUPIVACAINE HCL (PF) 0.25 % IJ SOLN
INTRAMUSCULAR | Status: DC | PRN
  Administered 2015-05-11: 30 mL

## 2015-05-11 MED ORDER — HYDROMORPHONE HCL 1 MG/ML IJ SOLN
INTRAMUSCULAR | Status: DC | PRN
  Administered 2015-05-11 (×2): 0.5 mg via INTRAVENOUS

## 2015-05-11 MED ORDER — ARTIFICIAL TEARS OP OINT
TOPICAL_OINTMENT | OPHTHALMIC | Status: AC
  Filled 2015-05-11: qty 3.5

## 2015-05-11 MED ORDER — DIPHENHYDRAMINE HCL 12.5 MG/5ML PO ELIX
12.5000 mg | ORAL_SOLUTION | Freq: Four times a day (QID) | ORAL | Status: DC | PRN
  Administered 2015-05-11: 12.5 mg via ORAL
  Filled 2015-05-11: qty 5

## 2015-05-11 MED ORDER — FENTANYL CITRATE (PF) 100 MCG/2ML IJ SOLN: 25.0000 ug | INTRAMUSCULAR | Status: DC | PRN

## 2015-05-11 MED ORDER — ONDANSETRON HCL 4 MG/2ML IJ SOLN
INTRAMUSCULAR | Status: AC
Start: 2015-05-11 — End: 2015-05-11
  Filled 2015-05-11: qty 2

## 2015-05-11 MED ORDER — GLYCOPYRROLATE 0.2 MG/ML IJ SOLN
INTRAMUSCULAR | Status: DC | PRN
  Administered 2015-05-11: 0.4 mg via INTRAVENOUS

## 2015-05-11 MED ORDER — FENTANYL CITRATE (PF) 100 MCG/2ML IJ SOLN
INTRAMUSCULAR | Status: DC | PRN
  Administered 2015-05-11: 100 ug via INTRAVENOUS
  Administered 2015-05-11 (×3): 50 ug via INTRAVENOUS
  Administered 2015-05-11: 100 ug via INTRAVENOUS

## 2015-05-11 MED ORDER — HYDROMORPHONE 1 MG/ML IV SOLN
INTRAVENOUS | Status: DC
  Administered 2015-05-11: 2.7 mg via INTRAVENOUS
  Administered 2015-05-11: 17:00:00 via INTRAVENOUS
  Administered 2015-05-11: 0.9 mg via INTRAVENOUS
  Administered 2015-05-12 (×2): 1.2 mg via INTRAVENOUS
  Filled 2015-05-11: qty 25

## 2015-05-11 MED ORDER — OXYCODONE-ACETAMINOPHEN 5-325 MG PO TABS
1.0000 | ORAL_TABLET | ORAL | Status: DC | PRN
  Administered 2015-05-12: 2 via ORAL
  Filled 2015-05-11: qty 2

## 2015-05-11 MED ORDER — SODIUM CHLORIDE 0.9 % IJ SOLN
INTRAMUSCULAR | Status: AC
  Filled 2015-05-11: qty 10

## 2015-05-11 MED ORDER — CEFAZOLIN SODIUM-DEXTROSE 2-3 GM-% IV SOLR
2.0000 g | INTRAVENOUS | Status: AC
  Administered 2015-05-11: 2 g via INTRAVENOUS

## 2015-05-11 MED ORDER — FENTANYL CITRATE (PF) 100 MCG/2ML IJ SOLN
INTRAMUSCULAR | Status: AC
  Filled 2015-05-11: qty 2

## 2015-05-11 MED ORDER — CEFAZOLIN SODIUM-DEXTROSE 2-3 GM-% IV SOLR
INTRAVENOUS | Status: AC
  Filled 2015-05-11: qty 50

## 2015-05-11 MED ORDER — HYDROMORPHONE HCL 1 MG/ML IJ SOLN
INTRAMUSCULAR | Status: AC
  Administered 2015-05-11: 0.5 mg via INTRAVENOUS
  Filled 2015-05-11: qty 1

## 2015-05-11 MED ORDER — DEXTROSE IN LACTATED RINGERS 5 % IV SOLN
INTRAVENOUS | Status: DC
  Administered 2015-05-11 – 2015-05-12 (×2): via INTRAVENOUS

## 2015-05-11 MED ORDER — SODIUM CHLORIDE 0.9 % IJ SOLN: 9.0000 mL | INTRAMUSCULAR | Status: DC | PRN

## 2015-05-11 MED ORDER — ROCURONIUM BROMIDE 100 MG/10ML IV SOLN
INTRAVENOUS | Status: AC
  Filled 2015-05-11: qty 1

## 2015-05-11 MED ORDER — FENTANYL CITRATE (PF) 250 MCG/5ML IJ SOLN
INTRAMUSCULAR | Status: AC
  Filled 2015-05-11: qty 5

## 2015-05-11 MED ORDER — DIPHENHYDRAMINE HCL 50 MG/ML IJ SOLN: 12.5000 mg | Freq: Four times a day (QID) | INTRAMUSCULAR | Status: DC | PRN

## 2015-05-11 MED ORDER — SCOPOLAMINE 1 MG/3DAYS TD PT72
1.0000 | MEDICATED_PATCH | Freq: Once | TRANSDERMAL | Status: DC
  Administered 2015-05-11: 1.5 mg via TRANSDERMAL

## 2015-05-11 MED ORDER — NEOSTIGMINE METHYLSULFATE 10 MG/10ML IV SOLN
INTRAVENOUS | Status: DC | PRN
  Administered 2015-05-11: 3 mg via INTRAVENOUS

## 2015-05-11 MED ORDER — LIDOCAINE HCL (CARDIAC) 20 MG/ML IV SOLN
INTRAVENOUS | Status: AC
  Filled 2015-05-11: qty 5

## 2015-05-11 MED ORDER — PROMETHAZINE HCL 25 MG/ML IJ SOLN: 6.2500 mg | INTRAMUSCULAR | Status: DC | PRN

## 2015-05-11 MED ORDER — ROCURONIUM BROMIDE 100 MG/10ML IV SOLN
INTRAVENOUS | Status: DC | PRN
  Administered 2015-05-11: 50 mg via INTRAVENOUS

## 2015-05-11 MED ORDER — KETOROLAC TROMETHAMINE 30 MG/ML IJ SOLN: 30.0000 mg | Freq: Four times a day (QID) | INTRAMUSCULAR | Status: DC

## 2015-05-11 MED ORDER — MEPERIDINE HCL 25 MG/ML IJ SOLN: 6.2500 mg | INTRAMUSCULAR | Status: DC | PRN

## 2015-05-11 MED ORDER — PROPOFOL 10 MG/ML IV BOLUS
INTRAVENOUS | Status: AC
  Filled 2015-05-11: qty 20

## 2015-05-11 MED ORDER — NEOSTIGMINE METHYLSULFATE 10 MG/10ML IV SOLN
INTRAVENOUS | Status: AC
  Filled 2015-05-11: qty 1

## 2015-05-11 MED ORDER — DEXAMETHASONE SODIUM PHOSPHATE 4 MG/ML IJ SOLN
INTRAMUSCULAR | Status: AC
  Filled 2015-05-11: qty 1

## 2015-05-11 MED ORDER — GLYCOPYRROLATE 0.2 MG/ML IJ SOLN
INTRAMUSCULAR | Status: AC
  Filled 2015-05-11: qty 3

## 2015-05-11 MED ORDER — PHENYLEPHRINE HCL 10 MG/ML IJ SOLN
INTRAMUSCULAR | Status: DC | PRN
  Administered 2015-05-11: 0.1 mg via INTRAVENOUS

## 2015-05-11 MED ORDER — PROPOFOL 10 MG/ML IV BOLUS
INTRAVENOUS | Status: DC | PRN
  Administered 2015-05-11: 200 mg via INTRAVENOUS

## 2015-05-11 MED ORDER — ONDANSETRON HCL 4 MG/2ML IJ SOLN
INTRAMUSCULAR | Status: DC | PRN
  Administered 2015-05-11: 4 mg via INTRAVENOUS

## 2015-05-11 MED ORDER — BUPIVACAINE HCL (PF) 0.25 % IJ SOLN
INTRAMUSCULAR | Status: AC
  Filled 2015-05-11: qty 30

## 2015-05-11 MED ORDER — KETOROLAC TROMETHAMINE 30 MG/ML IJ SOLN
INTRAMUSCULAR | Status: AC
  Filled 2015-05-11: qty 1

## 2015-05-11 MED ORDER — HYDROMORPHONE HCL 1 MG/ML IJ SOLN
INTRAMUSCULAR | Status: AC
Start: 2015-05-11 — End: 2015-05-11
  Filled 2015-05-11: qty 1

## 2015-05-11 MED ORDER — SCOPOLAMINE 1 MG/3DAYS TD PT72
MEDICATED_PATCH | TRANSDERMAL | Status: AC
  Administered 2015-05-11: 1.5 mg via TRANSDERMAL
  Filled 2015-05-11: qty 1

## 2015-05-11 MED ORDER — LIDOCAINE HCL (CARDIAC) 20 MG/ML IV SOLN
INTRAVENOUS | Status: DC | PRN
  Administered 2015-05-11: 100 mg via INTRAVENOUS

## 2015-05-11 MED ORDER — ESMOLOL HCL 100 MG/10ML IV SOLN
INTRAVENOUS | Status: DC | PRN
  Administered 2015-05-11: 10 mg via INTRAVENOUS

## 2015-05-11 MED ORDER — SODIUM CHLORIDE 0.9 % IV SOLN
INTRAVENOUS | Status: DC | PRN
  Administered 2015-05-11: 120 mL

## 2015-05-11 MED ORDER — MIDAZOLAM HCL 2 MG/2ML IJ SOLN
INTRAMUSCULAR | Status: DC | PRN
  Administered 2015-05-11: 2 mg via INTRAVENOUS

## 2015-05-11 MED ORDER — NALOXONE HCL 0.4 MG/ML IJ SOLN: 0.4000 mg | INTRAMUSCULAR | Status: DC | PRN

## 2015-05-11 MED ORDER — BUPIVACAINE LIPOSOME 1.3 % IJ SUSP
20.0000 mL | Freq: Once | INTRAMUSCULAR | Status: AC
  Administered 2015-05-11: 20 mL
  Filled 2015-05-11: qty 20

## 2015-05-11 MED ORDER — LACTATED RINGERS IV SOLN
INTRAVENOUS | Status: DC
  Administered 2015-05-11 (×2): via INTRAVENOUS

## 2015-05-11 MED ORDER — MIDAZOLAM HCL 2 MG/2ML IJ SOLN
INTRAMUSCULAR | Status: AC
  Filled 2015-05-11: qty 2

## 2015-05-11 MED ORDER — PNEUMOCOCCAL VAC POLYVALENT 25 MCG/0.5ML IJ INJ
0.5000 mL | INJECTION | INTRAMUSCULAR | Status: AC
  Administered 2015-05-12: 0.5 mL via INTRAMUSCULAR
  Filled 2015-05-11: qty 0.5

## 2015-05-11 MED ORDER — ONDANSETRON HCL 4 MG/2ML IJ SOLN: 4.0000 mg | Freq: Four times a day (QID) | INTRAMUSCULAR | Status: DC | PRN

## 2015-05-11 MED ORDER — HYDROMORPHONE HCL 1 MG/ML IJ SOLN
0.2500 mg | INTRAMUSCULAR | Status: DC | PRN
  Administered 2015-05-11 (×2): 0.5 mg via INTRAVENOUS

## 2015-05-11 MED ORDER — METOPROLOL SUCCINATE ER 100 MG PO TB24
100.0000 mg | ORAL_TABLET | Freq: Every day | ORAL | Status: DC
  Administered 2015-05-11 – 2015-05-12 (×2): 100 mg via ORAL
  Filled 2015-05-11 (×2): qty 1

## 2015-05-11 MED ORDER — SODIUM CHLORIDE 0.9 % IJ SOLN
INTRAMUSCULAR | Status: DC | PRN
  Administered 2015-05-11: 10 mL

## 2015-05-11 MED ORDER — DIPHENHYDRAMINE HCL 50 MG/ML IJ SOLN
12.5000 mg | Freq: Once | INTRAMUSCULAR | Status: AC
  Administered 2015-05-11: 12.5 mg via INTRAVENOUS

## 2015-05-11 MED ORDER — SODIUM CHLORIDE 0.9 % IJ SOLN
INTRAMUSCULAR | Status: AC
  Filled 2015-05-11: qty 50

## 2015-05-11 SURGICAL SUPPLY — 54 items
BARRIER ADHS 3X4 INTERCEED (GAUZE/BANDAGES/DRESSINGS) IMPLANT
BRR ADH 4X3 ABS CNTRL BYND (GAUZE/BANDAGES/DRESSINGS)
CATH FOLEY 3WAY  5CC 16FR (CATHETERS) ×1
CATH FOLEY 3WAY 5CC 16FR (CATHETERS) ×1 IMPLANT
CELL SAVER LIPIGURD (MISCELLANEOUS) IMPLANT
CLOTH BEACON ORANGE TIMEOUT ST (SAFETY) ×2 IMPLANT
CONT PATH 16OZ SNAP LID 3702 (MISCELLANEOUS) ×2 IMPLANT
COVER BACK TABLE 60X90IN (DRAPES) ×4 IMPLANT
COVER TIP SHEARS 8 DVNC (MISCELLANEOUS) ×1 IMPLANT
COVER TIP SHEARS 8MM DA VINCI (MISCELLANEOUS) ×1
DECANTER SPIKE VIAL GLASS SM (MISCELLANEOUS) ×10 IMPLANT
DURAPREP 26ML APPLICATOR (WOUND CARE) ×2 IMPLANT
ELECT REM PT RETURN 9FT ADLT (ELECTROSURGICAL) ×2
ELECTRODE REM PT RTRN 9FT ADLT (ELECTROSURGICAL) ×1 IMPLANT
EXTRT SYSTEM ALEXIS 14CM (MISCELLANEOUS)
GAUZE VASELINE 3X9 (GAUZE/BANDAGES/DRESSINGS) IMPLANT
GLOVE BIO SURGEON STRL SZ7.5 (GLOVE) ×6 IMPLANT
GLOVE BIOGEL PI IND STRL 7.0 (GLOVE) ×4 IMPLANT
GLOVE BIOGEL PI INDICATOR 7.0 (GLOVE) ×4
KIT ACCESSORY DA VINCI DISP (KITS) ×1
KIT ACCESSORY DVNC DISP (KITS) ×1 IMPLANT
LEGGING LITHOTOMY PAIR STRL (DRAPES) ×2 IMPLANT
LIQUID BAND (GAUZE/BANDAGES/DRESSINGS) ×2 IMPLANT
NEEDLE INSUFFLATION 150MM (ENDOMECHANICALS) ×2 IMPLANT
OCCLUDER COLPOPNEUMO (BALLOONS) ×2 IMPLANT
PACK ROBOT WH (CUSTOM PROCEDURE TRAY) ×2 IMPLANT
PACK ROBOTIC GOWN (GOWN DISPOSABLE) ×2 IMPLANT
PAD POSITIONING PINK XL (MISCELLANEOUS) ×2 IMPLANT
PAD PREP 24X48 CUFFED NSTRL (MISCELLANEOUS) ×4 IMPLANT
SET CYSTO W/LG BORE CLAMP LF (SET/KITS/TRAYS/PACK) ×2 IMPLANT
SET IRRIG TUBING LAPAROSCOPIC (IRRIGATION / IRRIGATOR) ×2 IMPLANT
SET TRI-LUMEN FLTR TB AIRSEAL (TUBING) ×2 IMPLANT
SLEEVE XCEL OPT CAN 5 100 (ENDOMECHANICALS) ×2 IMPLANT
SUT VIC AB 0 CT1 27 (SUTURE) ×4
SUT VIC AB 0 CT1 27XBRD ANBCTR (SUTURE) ×2 IMPLANT
SUT VICRYL 0 UR6 27IN ABS (SUTURE) ×2 IMPLANT
SUT VICRYL RAPIDE 4/0 PS 2 (SUTURE) ×4 IMPLANT
SUT VLOC 180 0 9IN  GS21 (SUTURE) ×1
SUT VLOC 180 0 9IN GS21 (SUTURE) ×1 IMPLANT
SYR 50ML LL SCALE MARK (SYRINGE) ×2 IMPLANT
SYRINGE 10CC LL (SYRINGE) ×2 IMPLANT
TIP RUMI ORANGE 6.7MMX12CM (TIP) IMPLANT
TIP UTERINE 5.1X6CM LAV DISP (MISCELLANEOUS) IMPLANT
TIP UTERINE 6.7X10CM GRN DISP (MISCELLANEOUS) IMPLANT
TIP UTERINE 6.7X6CM WHT DISP (MISCELLANEOUS) IMPLANT
TIP UTERINE 6.7X8CM BLUE DISP (MISCELLANEOUS) ×2 IMPLANT
TOWEL OR 17X24 6PK STRL BLUE (TOWEL DISPOSABLE) ×6 IMPLANT
TROCAR DISP BLADELESS 8 DVNC (TROCAR) ×1 IMPLANT
TROCAR DISP BLADELESS 8MM (TROCAR) ×1
TROCAR OPTI TIP 5M 100M (ENDOMECHANICALS) ×2 IMPLANT
TROCAR PORT AIRSEAL 5X120 (TROCAR) ×2 IMPLANT
TROCAR XCEL 12X100 BLDLESS (ENDOMECHANICALS) IMPLANT
TROCAR Z-THREAD 12X150 (TROCAR) ×2 IMPLANT
WATER STERILE IRR 1000ML POUR (IV SOLUTION) ×2 IMPLANT

## 2015-05-11 NOTE — Anesthesia Postprocedure Evaluation (Signed)
Anesthesia Post Note  Patient: [name]  Procedure(s) Performed: Procedure(s) (LRB): ROBOTIC ASSISTED TOTAL HYSTERECTOMY WITH BILATERAL SALPINGECTOMY (Bilateral)  Patient location during evaluation: PACU Anesthesia Type: General Level of consciousness: awake Pain management: pain level controlled Vital Signs Assessment: post-procedure vital signs reviewed and stable Respiratory status: spontaneous breathing Cardiovascular status: stable Postop Assessment: no signs of nausea or vomiting Anesthetic complications: no    Last Vitals:  Filed Vitals:   05/11/15 1054 05/11/15 1508  BP: 137/90 137/57  Pulse: 112 105  Temp: 36.9 C 36.7 C  Resp: 18 16    Last Pain:  Filed Vitals:   05/11/15 1529  PainSc: 10-Worst pain ever                 Adean Milosevic JR,JOHN Danuel Felicetti

## 2015-05-11 NOTE — Progress Notes (Signed)
Patient ID: [name], female   DOB: 07-01-1975, 40 y.o.   MRN: [id number] Patient seen and examined. Consent witnessed and signed. No changes noted. Update completed.

## 2015-05-11 NOTE — Transfer of Care (Signed)
Immediate Anesthesia Transfer of Care Note  Patient: [name]  Procedure(s) Performed: Procedure(s): ROBOTIC ASSISTED TOTAL HYSTERECTOMY WITH BILATERAL SALPINGECTOMY (Bilateral)  Patient Location: PACU  Anesthesia Type:General  Level of Consciousness: awake  Airway & Oxygen Therapy: Patient Spontanous Breathing and Patient connected to nasal cannula oxygen  Post-op Assessment: Report given to RN and Post -op Vital signs reviewed and stable  Post vital signs: stable  Last Vitals:  Filed Vitals:   05/11/15 1054  BP: 137/90  Pulse: 112  Temp: 36.9 C  Resp: 18    Complications: No apparent anesthesia complications

## 2015-05-11 NOTE — Anesthesia Procedure Notes (Signed)
Procedure Name: Intubation Date/Time: 05/11/2015 1:12 PM Performed by: Riki Sheer Pre-anesthesia Checklist: Patient identified, Emergency Drugs available, Suction available, Patient being monitored and Timeout performed Patient Re-evaluated:Patient Re-evaluated prior to inductionOxygen Delivery Method: Circle system utilized Preoxygenation: Pre-oxygenation with 100% oxygen Intubation Type: IV induction Ventilation: Mask ventilation without difficulty Laryngoscope Size: Miller and 2 Grade View: Grade I Tube type: Oral Tube size: 7.0 mm Number of attempts: 1 Airway Equipment and Method: Stylet Placement Confirmation: ETT inserted through vocal cords under direct vision,  positive ETCO2,  CO2 detector and breath sounds checked- equal and bilateral Secured at: 20 cm Tube secured with: Tape Dental Injury: Teeth and Oropharynx as per pre-operative assessment

## 2015-05-11 NOTE — Op Note (Signed)
05/11/2015  2:55 PM  PATIENT:  [name]  40 y.o. female  PRE-OPERATIVE DIAGNOSIS:  Dysmenorrhea, Endometriosis  POST-OPERATIVE DIAGNOSIS:  Dysmenorrhea, Endometriosis  PROCEDURE:  Procedure(s): ROBOTIC ASSISTED TOTAL HYSTERECTOMY WITH BILATERAL SALPINGECTOMY LYSIS OF ADHESIONS MCCALL CUL DE PLASTY ABLATION OF ENDOMETRIOSIS  SURGEON:  Surgeon(s): Brien Few, MD  ASSISTANTSDrake Leach, CNM   ANESTHESIA:   local and general  ESTIMATED BLOOD LOSS: * No blood loss amount entered *   DRAINS: Urinary Catheter (Foley)   LOCAL MEDICATIONS USED:  BUPIVICAINE  and Amount: 50 ml  SPECIMEN:  Source of Specimen:  UTERUS, CERVIX AND TUBES  DISPOSITION OF SPECIMEN:  PATHOLOGY  COUNTS:  YES  DICTATION #: [id number]  PLAN OF CARE: DC HOME IN AM- EXTENDED STAY 23 HR  PATIENT DISPOSITION:  PACU - hemodynamically stable.

## 2015-05-11 NOTE — Anesthesia Preprocedure Evaluation (Addendum)
Anesthesia Evaluation  Patient identified by MRN, date of birth, ID band Patient awake    Reviewed: Allergy & Precautions, H&P , NPO status , Patient's Chart, lab work & pertinent test results  History of Anesthesia Complications (+) PONV and history of anesthetic complications  Airway Mallampati: I  TM Distance: >3 FB Neck ROM: full    Dental no notable dental hx. (+) Teeth Intact   Pulmonary Current Smoker,    Pulmonary exam normal        Cardiovascular hypertension, Normal cardiovascular exam     Neuro/Psych    GI/Hepatic negative GI ROS, Neg liver ROS,   Endo/Other  negative endocrine ROS  Renal/GU negative Renal ROS     Musculoskeletal   Abdominal Normal abdominal exam  (+)   Peds  Hematology negative hematology ROS (+)   Anesthesia Other Findings   Reproductive/Obstetrics negative OB ROS                            Anesthesia Physical Anesthesia Plan  ASA: II  Anesthesia Plan: General   Post-op Pain Management:    Induction: Intravenous  Airway Management Planned: Oral ETT  Additional Equipment:   Intra-op Plan:   Post-operative Plan: Extubation in OR  Informed Consent: I have reviewed the patients History and Physical, chart, labs and discussed the procedure including the risks, benefits and alternatives for the proposed anesthesia with the patient or authorized representative who has indicated his/her understanding and acceptance.   Dental Advisory Given  Plan Discussed with: CRNA and Surgeon  Anesthesia Plan Comments:         Anesthesia Quick Evaluation

## 2015-05-12 ENCOUNTER — Encounter (HOSPITAL_COMMUNITY): Payer: Self-pay | Admitting: Obstetrics and Gynecology

## 2015-05-12 DIAGNOSIS — N946 Dysmenorrhea, unspecified: Secondary | ICD-10-CM | POA: Diagnosis not present

## 2015-05-12 LAB — CBC
HCT: 35.6 % — ABNORMAL LOW (ref 36.0–46.0)
HEMOGLOBIN: 11.9 g/dL — AB (ref 12.0–15.0)
MCH: 32.9 pg (ref 26.0–34.0)
MCHC: 33.4 g/dL (ref 30.0–36.0)
MCV: 98.3 fL (ref 78.0–100.0)
Platelets: 262 10*3/uL (ref 150–400)
RBC: 3.62 MIL/uL — AB (ref 3.87–5.11)
RDW: 12.8 % (ref 11.5–15.5)
WBC: 19.9 10*3/uL — AB (ref 4.0–10.5)

## 2015-05-12 LAB — BASIC METABOLIC PANEL
ANION GAP: 7 (ref 5–15)
BUN: 7 mg/dL (ref 6–20)
CHLORIDE: 105 mmol/L (ref 101–111)
CO2: 26 mmol/L (ref 22–32)
Calcium: 8.6 mg/dL — ABNORMAL LOW (ref 8.9–10.3)
Creatinine, Ser: 0.78 mg/dL (ref 0.44–1.00)
GFR calc Af Amer: >60 mL/min (ref >60)
GFR calc non Af Amer: >60 mL/min (ref >60)
Glucose, Bld: 120 mg/dL — ABNORMAL HIGH (ref 65–99)
POTASSIUM: 4 mmol/L (ref 3.5–5.1)
SODIUM: 138 mmol/L (ref 135–145)

## 2015-05-12 MED ORDER — OXYCODONE-ACETAMINOPHEN 5-325 MG PO TABS: 1.0000 | ORAL_TABLET | ORAL | Status: DC | PRN

## 2015-05-12 NOTE — Addendum Note (Signed)
Addendum  created 05/12/15 0829 by Alvy Bimler, CRNA   Modules edited: Anesthesia Events, Clinical Notes, Narrator   Clinical Notes:  File: [id number]   Narrator:  Narrator: Event Log Edited

## 2015-05-12 NOTE — Op Note (Signed)
[name], [name]      ACCOUNT NO.:  [id number]  MEDICAL RECORD NO.:  [id number]  LOCATION:  [id number]                          FACILITY:  [facility]  PHYSICIAN:  Lovenia Kim, M.D.DATE OF BIRTH:  06-14-1975  DATE OF PROCEDURE:  05/11/2015 DATE OF DISCHARGE:                              OPERATIVE REPORT   PREOPERATIVE DIAGNOSES:  Recurrent severe dysmenorrhea, menorrhagia, and pelvic pain.  POSTOPERATIVE DIAGNOSES:  Recurrent endometriosis, pelvic adhesions, enterocele.  PROCEDURE:  Da Vinci assisted total laparoscopic hysterectomy, bilateral salpingectomy, lysis of bilateral peritubal adhesions, lysis of adhesions of left sigmoid colon to left adnexa, lysis of severe bladder adhesions to the wall, anterior uterine body, McCall culdoplasty, ablation of endometriosis.  SURGEON:  Lovenia Kim, M.D.  ASSISTANT:  Julianne Handler, [employer].  ANESTHESIA:  General.  ESTIMATED BLOOD LOSS:  Less than 50 mL.  COMPLICATIONS:  None.  DRAINS:  Foley.  COUNTS:  Correct.  DISPOSITION:  The patient to recovery in good condition.  BRIEF OPERATIVE NOTE:  After being apprised of risks of anesthesia, infection, bleeding, injury to surrounding organs, possible need for repair, delayed versus immediate complications to include bowel and bladder injury, possible need for repair, the patient was brought to the operating room where she was administered a general anesthetic without complications.  Prepped and draped in usual sterile fashion.  Foley catheter placed.  A RUMI retractor was placed vaginally without difficulty after exam under anesthesia revealed a bulky anteflexed uterus.  At this time, umbilical incision made with a scalpel.  Veress needle placed, opening pressure of -1, 3 L of CO2 insufflated without difficulty.  Atraumatic trocar entry.  Visualization revealed peritubal adhesions to the right ovarian and left ovarian wall, adhesions of the sigmoid colon into the left  adnexa, significant bladder adhesions to the body and midportion of the uterus.  Normal posterior cul-de-sac.  Some surface endometriosis on the left and right ovaries.  At this time, 2 robotic ports were placed, one on the left and one on the right, and a 5- mm assistant port on the left.  Deep Trendelenburg position was established.  Robot docked in a standard fashion.  PK forceps, Endo Shears placed.  Visualization revealed as noted above.  On the left adnexa, the adhesions were lysed sharply from the sigmoid colon into the left adnexa freeing up the left adnexa whereby the left tube was adherent to the right ovarian wall.  These were sharply excised.  The mesosalpinx was undermined and detached.  Ablation of endometriosis in left ovary was performed.  Retroperitoneal space was entered.  Ureters identified along the medial leaf of the peritoneum and dissected sharply off medial leaf of the peritoneum.  The ovarian ligament was skeletonized, cauterized, and cut.  The round ligament was divided and the broad ligament was developed.  The bladder flap was sharply developed due to thick adhesions to the midbody of the uterus down to the level of the RUMI cup.  Sharp dissection was performed.  The left uterine vessels were skeletonized, cauterized, and not cut.  On the right side, similar adhesions of the right tube into the right ovary were lysed and the mesosalpinx was undermined and divided using monopolar cautery.  Ablation of endometriosis on the right  ovary was performed.  Ureters identified along the medial leaf of peritoneum. Retroperitoneal space was entered.  Ureters dissected caudad.  The posterior leaf of the broad ligament was developed down to the level of the uterosacral ligament.  The right round ligament was cauterized, divided, and opened.  Bladder flap was further developed sharply.  The right uterine vessels were skeletonized, cauterized, and cut.  The left uterine  vessels were skeletonized, cauterized, and cut.  The cervicovaginal ring was identified and opened using monopolar cautery. The specimen was detached and retracted into the vagina.  Enterocele was identified.  The vaginal cuff was closed using 0 V-Loc suture in a continuous running fashion.  A second imbricating layer was placed.  A McCall culdoplasty suture placed in the standard fashion.  Good hemostasis was noted.  Ureters were seen peristalsing normally and bilaterally.  Urine was clear and copious.  At this time, irrigation was accomplished and all instruments were removed under direct visualization.  CO2 was released.  Positive pressure applied.  Incision was closed using 0 Vicryl, 4-0 Vicryl, and Dermabond.  Exparel and Marcaine solution was placed in all incisions.  Ropivacaine had been placed into the pelvis prior to removal of the trocars.  The patient tolerated the procedure well, was awakened and transferred to recovery after vaginal exam revealed an intact vaginal vault.  The patient tolerated the procedure well and was to recovery in good condition.     Lovenia Kim, M.D.     RJT/MEDQ  D:  05/11/2015  T:  05/12/2015  Job:  [id number]

## 2015-05-12 NOTE — Progress Notes (Signed)
Pt ambulated out  Teaching complete 

## 2015-05-12 NOTE — Anesthesia Postprocedure Evaluation (Signed)
Anesthesia Post Note  Patient: [name]  Procedure(s) Performed: Procedure(s) (LRB): ROBOTIC ASSISTED TOTAL HYSTERECTOMY WITH BILATERAL SALPINGECTOMY (Bilateral)  Patient location during evaluation: [facility] Anesthesia Type: General Level of consciousness: awake, awake and alert, oriented and patient cooperative Pain management: pain level controlled Vital Signs Assessment: post-procedure vital signs reviewed and stable Respiratory status: spontaneous breathing Cardiovascular status: blood pressure returned to baseline and stable Postop Assessment: no headache, no backache, patient able to bend at knees, no signs of nausea or vomiting and adequate PO intake Anesthetic complications: no    Last Vitals:  Filed Vitals:   05/12/15 0600 05/12/15 0622  BP:  99/59  Pulse:  82  Temp:  36.6 C  Resp: 18 15    Last Pain:  Filed Vitals:   05/12/15 0758  PainSc: 8                  Koy Lamp

## 2015-05-12 NOTE — Progress Notes (Signed)
1 Day Post-Op Procedure(s) (LRB): ROBOTIC ASSISTED TOTAL HYSTERECTOMY WITH BILATERAL SALPINGECTOMY (Bilateral)  Subjective: Patient reports nausea, incisional pain, tolerating PO, + flatus and no problems voiding.    Objective: I have reviewed patient's vital signs, intake and output, medications and labs. BP 101/64 mmHg  Pulse 96  Temp(Src) 97.8 F (36.6 C) (Axillary)  Resp 13  Ht 5\' 3"  (1.6 m)  Wt 60.328 kg (133 lb)  BMI 23.57 kg/m2  SpO2 100%  LMP 05/10/2015  CBC    Component Value Date/Time   WBC 19.9* 05/12/2015 0625   RBC 3.62* 05/12/2015 0625   HGB 11.9* 05/12/2015 0625   HCT 35.6* 05/12/2015 0625   PLT 262 05/12/2015 0625   MCV 98.3 05/12/2015 0625   MCH 32.9 05/12/2015 0625   MCHC 33.4 05/12/2015 0625   RDW 12.8 05/12/2015 0625   LYMPHSABS 2.4 12/16/2014 0917   MONOABS 1.1* 12/16/2014 0917   EOSABS 0.4 12/16/2014 0917   BASOSABS 0.0 12/16/2014 0917     General: alert, cooperative and appears stated age Resp: clear to auscultation bilaterally Cardio: regular rate and rhythm, S1, S2 normal, no murmur, click, rub or gallop GI: soft, non-tender; bowel sounds normal; no masses,  no organomegaly, normal findings: aorta normal and bowel sounds normal and incision: clean, dry and intact Extremities: extremities normal, atraumatic, no cyanosis or edema and Homans sign is negative, no sign of DVT Vaginal Bleeding: minimal  Assessment: s/p Procedure(s): ROBOTIC ASSISTED TOTAL HYSTERECTOMY WITH BILATERAL SALPINGECTOMY (Bilateral): stable, progressing well and tolerating diet  Plan: Advance diet Encourage ambulation Advance to PO medication Discontinue IV fluids Discharge home     Shameeka Silliman J 05/12/2015, 6:11 AM

## 2015-05-21 ENCOUNTER — Other Ambulatory Visit: Payer: Self-pay

## 2015-05-26 ENCOUNTER — Other Ambulatory Visit: Payer: Self-pay | Admitting: Neurological Surgery

## 2015-05-26 ENCOUNTER — Ambulatory Visit (INDEPENDENT_AMBULATORY_CARE_PROVIDER_SITE_OTHER): Payer: Medicaid Other

## 2015-05-26 DIAGNOSIS — M542 Cervicalgia: Secondary | ICD-10-CM

## 2015-05-26 DIAGNOSIS — Z6826 Body mass index (BMI) 26.0-26.9, adult: Secondary | ICD-10-CM | POA: Diagnosis not present

## 2015-06-01 DIAGNOSIS — R300 Dysuria: Secondary | ICD-10-CM | POA: Diagnosis not present

## 2015-06-09 MED FILL — clonazePAM 1 MG TABS: 1 | 30 days supply | Qty: 60 | Fill #1

## 2015-06-09 MED FILL — CYCLOBENZAPRINE 10 MG TAB: 10 | 30 days supply | Qty: 90 | Fill #1

## 2015-06-09 MED FILL — METOPROLOL SUCC ER 100 MG T: 100 | 30 days supply | Qty: 30 | Fill #1

## 2015-06-10 MED FILL — ZOLPIDEM TARTRATE 10 MG TAB: 10 | 30 days supply | Qty: 30 | Fill #3

## 2015-06-12 ENCOUNTER — Ambulatory Visit
Admission: RE | Admit: 2015-06-12 | Discharge: 2015-06-12 | Disposition: A | Discharge status: Home / Self Care | Payer: Medicaid Other | Source: Ambulatory Visit | Attending: Neurological Surgery | Admitting: Neurological Surgery

## 2015-06-12 DIAGNOSIS — M47812 Spondylosis without myelopathy or radiculopathy, cervical region: Secondary | ICD-10-CM | POA: Diagnosis not present

## 2015-06-12 DIAGNOSIS — M542 Cervicalgia: Secondary | ICD-10-CM

## 2015-06-12 MED ORDER — IOHEXOL 300 MG/ML  SOLN
1.0000 mL | Freq: Once | INTRAMUSCULAR | Status: AC | PRN
  Administered 2015-06-12: 1 mL via EPIDURAL

## 2015-06-12 MED ORDER — TRIAMCINOLONE ACETONIDE 40 MG/ML IJ SUSP (RADIOLOGY)
60.0000 mg | Freq: Once | INTRAMUSCULAR | Status: AC
  Administered 2015-06-12: 60 mg via EPIDURAL

## 2015-06-12 NOTE — Discharge Instructions (Signed)

## 2015-06-15 ENCOUNTER — Encounter: Payer: Self-pay | Admitting: Physician Assistant

## 2015-06-15 ENCOUNTER — Ambulatory Visit (INDEPENDENT_AMBULATORY_CARE_PROVIDER_SITE_OTHER): Payer: Medicaid Other | Admitting: Physician Assistant

## 2015-06-15 VITALS — BP 111/64 | HR 81 | Ht 63.0 in | Wt 137.0 lb

## 2015-06-15 DIAGNOSIS — F411 Generalized anxiety disorder: Secondary | ICD-10-CM | POA: Diagnosis not present

## 2015-06-15 DIAGNOSIS — G4700 Insomnia, unspecified: Secondary | ICD-10-CM | POA: Diagnosis not present

## 2015-06-15 DIAGNOSIS — R232 Flushing: Secondary | ICD-10-CM | POA: Insufficient documentation

## 2015-06-15 DIAGNOSIS — F4310 Post-traumatic stress disorder, unspecified: Secondary | ICD-10-CM | POA: Diagnosis not present

## 2015-06-15 MED ORDER — CLONAZEPAM 1 MG PO TABS: ORAL_TABLET | ORAL | Status: DC

## 2015-06-15 MED ORDER — DULOXETINE HCL 60 MG PO CPEP: 60.0000 mg | ORAL_CAPSULE | Freq: Every day | ORAL | Status: DC

## 2015-06-15 MED ORDER — ZOLPIDEM TARTRATE 10 MG PO TABS: 10.0000 mg | ORAL_TABLET | Freq: Every evening | ORAL | Status: DC | PRN

## 2015-06-15 NOTE — Progress Notes (Signed)
   Subjective:    Patient ID: [name], female    DOB: 07-14-1975, 40 y.o.   MRN: [id number]  HPI  Pt is a 40 yo female who presents to the clinic to discuss episodic neck and facial flushing with anxiety. She will be driving and this happens or just going to grocery store. She is not seeing psychiatry anymore. Occurs 4-5 times a month. Only on chest and neck. No itching. Goes away in a few minutes. On cymbalta and klonapin.   Continues to have problems with sleep. [name] helps this but insurance is only paying for 15 tablets a month. lunesta she did not tolerate due to metallic taste in mouth. She needs a letter to get 30 tabs a month.     Review of Systems  All other systems reviewed and are negative.      Objective:   Physical Exam  Constitutional: She is oriented to person, place, and time. She appears well-developed and well-nourished.  HENT:  Head: Normocephalic and atraumatic.  Cardiovascular: Normal rate, regular rhythm and normal heart sounds.   Pulmonary/Chest: Effort normal and breath sounds normal. She has no wheezes.  Neurological: She is alert and oriented to person, place, and time.  Skin: Skin is dry.  Psychiatric: She has a normal mood and affect. Her behavior is normal.          Assessment & Plan:  Insomnia, chronic- wrote letter to approve 30 tablets a month to send to [facility]. Refilled today. Follow up in 3 months.   GAD/PTSD/vasomotor flushing due to anxiety- discussed to break in half klonapin and take as needed. I do not want to increase as needed benzo so she needs to take current dose and spread out and only take when needed. Increased cymbalta to 60mg  daily. Follow up in 3 months.

## 2015-06-17 ENCOUNTER — Encounter: Payer: Self-pay | Admitting: Physician Assistant

## 2015-06-24 DIAGNOSIS — G5600 Carpal tunnel syndrome, unspecified upper limb: Secondary | ICD-10-CM | POA: Diagnosis not present

## 2015-06-24 DIAGNOSIS — M25532 Pain in left wrist: Secondary | ICD-10-CM | POA: Diagnosis not present

## 2015-06-26 DIAGNOSIS — G5602 Carpal tunnel syndrome, left upper limb: Secondary | ICD-10-CM | POA: Diagnosis not present

## 2015-06-29 DIAGNOSIS — R300 Dysuria: Secondary | ICD-10-CM | POA: Diagnosis not present

## 2015-06-29 LAB — HM MAMMOGRAPHY

## 2015-07-02 ENCOUNTER — Emergency Department
Admission: EM | Admit: 2015-07-02 | Discharge: 2015-07-02 | Disposition: A | Discharge status: Home / Self Care | Payer: Medicaid Other | Source: Home / Self Care | Attending: Family Medicine | Admitting: Family Medicine

## 2015-07-02 ENCOUNTER — Encounter: Payer: Self-pay | Admitting: Emergency Medicine

## 2015-07-02 DIAGNOSIS — R52 Pain, unspecified: Secondary | ICD-10-CM

## 2015-07-02 DIAGNOSIS — Z8739 Personal history of other diseases of the musculoskeletal system and connective tissue: Secondary | ICD-10-CM

## 2015-07-02 MED ORDER — KETOROLAC TROMETHAMINE 60 MG/2ML IM SOLN
60.0000 mg | Freq: Once | INTRAMUSCULAR | Status: AC
  Administered 2015-07-02: 60 mg via INTRAMUSCULAR

## 2015-07-02 NOTE — Discharge Instructions (Signed)
You may take 300mg  of your Gabapentin TONIGHT.  Then, take 300mg  every 12 hours tomorrow.  If pain not under control, on Saturday, 07/04/15, take 300mg  every 8 hours.  You may then continue to take Gabapentin 300mg  three times a day (every 8 hours) unless advised otherwise by your neurosurgeon or orthopedist.   Your Orthopedic hand surgeon did call in a new pain medication for you, 07/02/2015 5:03 PM EST-  CELEBREX 400MG  once daily per Dr Cyndia Diver for her pain.  You may start taking this new medication TOMORROW morning because you were given a shot of Toradol this evening, which is also an antiinflammatory medication.      Please be sure to follow up with your primary care provider, neurosurgeon, and orthopedist if your pain is not being well managed as it is important to have all your care providers on the same page and make sure they all know the different medications you are taking so they can make sure your pain is being treated appropriately.

## 2015-07-02 NOTE — ED Notes (Signed)
Left hand pain started yesterday then face started hurting and now her entire body is in pain and sensitive to touch. Has been on prednisone for 4 days for inflammation in left hand, surgeon told her to stop taking. 10/10

## 2015-07-02 NOTE — ED Provider Notes (Signed)
CSN: [id number]     Arrival date & time 07/02/15  1747 History   None    Chief Complaint  Patient presents with  . Hand Pain   (Consider location/radiation/quality/duration/timing/severity/associated sxs/prior Treatment) HPI  The pt is a 40yo female with hx of anxiety and fibromyalgia, presenting to [facility] with c/o "pain all over" that is very sensitive to touch.  Pt states she has been seeing Dr. Fredna Dow, hand surgeon, for Left hand pain and numbness.  She was placed on prednisone to help decrease swelling in her hand but pain continued so she was advised to stop taking the Prednisone yesterday.  Since then, she has had diffuse pain all over her body.  Pain is burning, 10/10.  States it feels like her myofascial pain syndrome type pain.  She reports hx of cervical spine surgery and was advised by her neurosurgeon to not take prednisone.  She does have leftover gabapentin that was prescribed by her neurosurgeon but has not taken in several months as she has not had a flare of her pain so she was unsure how much to take.  She was unable to get a hold of her neurosurgeon today.  Per medical records in [place], Dr. Levell July office did call in a prescription for Celebrex at [facility] today.  Pt was unaware of this and plans to pick it up once she leaves [facility] today.  Denies fever, chills, n/v/d. Denies chest pain or SOB.   Past Medical History  Diagnosis Date  . Anxiety   . Headache(784.0)   . IBS (irritable bowel syndrome)     questionable, per pt; saw Dr. Collene Mares  . IC (interstitial cystitis)   . Insomnia   . Asthma   . Hypertension     no longer takes meds for HTN  . Fibromyalgia   . Elevated heart rate and blood pressure     takes toprol for increased heart rate  . Complication of anesthesia   . PONV (postoperative nausea and vomiting)   . Dysrhythmia    Past Surgical History  Procedure Laterality Date  . Cesarean section    . Pelvic laparoscopy  2003    endometriosis  . Ovarian cyst removal     . Dilation and evacuation N/A 05/07/2013    Procedure: DILATATION AND EVACUATION with tissue sent for chromosome analysis;  Surgeon: Lovenia Kim, MD;  Location: [facility];  Service: Gynecology;  Laterality: N/A;  . Cervical spine surgery  8/15  . Carpal tunnel release Left 06/05/2014    Procedure: LEFT CARPAL TUNNEL RELEASE;  Surgeon: Daryll Brod, MD;  Location: [facility];  Service: Orthopedics;  Laterality: Left;  . Robotic assisted laparoscopic lysis of adhesion N/A 07/18/2014    Procedure: ROBOTIC ASSISTED LAPAROSCOPIC  EXCISION POSTERIOR  UTERINE WALL MASS; EXCISION RIGHT MESSALEINGEAL MASS, EXCISION AND ABLATION CULDASAC ENDOMETRIOSIS;  Surgeon: Brien Few, MD;  Location: [facility];  Service: Gynecology;  Laterality: N/A;  . Carpal tunnel release Right 08/25/2014    Procedure: RIGHT CARPAL TUNNEL RELEASE;  Surgeon: Daryll Brod, MD;  Location: [facility];  Service: Orthopedics;  Laterality: Right;  . Posterior cervical fusion/foraminotomy N/A 10/28/2014    Procedure: Cervical Four-Cervical Seven Posterior cervical fusion with lateral mass fixation;  Surgeon: Eustace Moore, MD;  Location: [facility];  Service: Neurosurgery;  Laterality: N/A;  posterior  . Epidural steroid injection  03/20/15  . Robotic assisted total hysterectomy with salpingectomy Bilateral 05/11/2015    Procedure: ROBOTIC ASSISTED TOTAL  HYSTERECTOMY WITH BILATERAL SALPINGECTOMY;  Surgeon: Brien Few, MD;  Location: [facility];  Service: Gynecology;  Laterality: Bilateral;   Family History  Problem Relation Age of Onset  . Heart attack Father   . ADD / ADHD Brother   . Depression Brother   . Depression Cousin    Social History  Substance Use Topics  . Smoking status: Current Some Day Smoker -- 0.50 packs/day for 18 years    Types: Cigarettes  . Smokeless tobacco: Never Used  . Alcohol Use: No   OB History    No data available     Review of Systems  Constitutional: Negative for fever  and chills.  Respiratory: Negative for cough and shortness of breath.   Cardiovascular: Negative for chest pain and palpitations.  Gastrointestinal: Negative for nausea, vomiting, abdominal pain and diarrhea.  Musculoskeletal: Positive for myalgias, back pain, arthralgias and neck pain. Negative for joint swelling, gait problem and neck stiffness.  Skin: Negative for color change and rash.  Neurological: Positive for numbness ( mild intermittent, "all over"). Negative for weakness.    Allergies  Prednisone; Gadolinium derivatives; Hydrocodone-acetaminophen; Lunesta; Tramadol; Amitriptyline; and Eggs or egg-derived products  Home Medications   Prior to Admission medications   Medication Sig Start Date End Date Taking? Authorizing Provider  albuterol (PROVENTIL HFA;VENTOLIN HFA) 108 (90 BASE) MCG/ACT inhaler Inhale 2 puffs into the lungs every 4 (four) hours as needed for wheezing. Patient taking differently: Inhale 2 puffs into the lungs every 4 (four) hours as needed for wheezing or shortness of breath (bronchitis).  04/14/14 04/14/15  Jade L Breeback, PA-C  clonazePAM (KLONOPIN) 1 MG tablet Take one full tablet twice a day. 06/15/15   Jade L Breeback, PA-C  DULoxetine (CYMBALTA) 60 MG capsule Take 1 capsule (60 mg total) by mouth daily. 06/15/15   Jade L Breeback, PA-C  meloxicam (MOBIC) 15 MG tablet TAKE ONE TABLET BY MOUTH EACH MORNING WITH BREAKFAST 12/22/14   Silverio Decamp, MD  metoprolol succinate (TOPROL-XL) 100 MG 24 hr tablet Take 1 tablet (100 mg total) by mouth daily. Take with or immediately following a meal. 04/01/15   Jade L Breeback, PA-C  oxyCODONE-acetaminophen (PERCOCET/ROXICET) 5-325 MG tablet Take 1-2 tablets by mouth every 4 (four) hours as needed for severe pain. 05/12/15   Brien Few, MD  zolpidem (AMBIEN) 10 MG tablet Take 1 tablet (10 mg total) by mouth at bedtime as needed for sleep. 06/15/15   Donella Stade, PA-C   Meds Ordered and Administered this Visit    Medications  ketorolac (TORADOL) injection 60 mg (60 mg Intramuscular Given 07/02/15 1830)    BP 138/84 mmHg  Pulse 130  Temp(Src) 98 F (36.7 C) (Oral)  Ht 5' (1.524 m)  Wt 132 lb (59.875 kg)  BMI 25.78 kg/m2  SpO2 96%  LMP 05/02/2015 No data found.   Physical Exam  Constitutional: She appears well-developed and well-nourished. She appears distressed.  Tearful but cooperative during exam   HENT:  Head: Normocephalic and atraumatic.  Eyes: Conjunctivae are normal. No scleral icterus.  Neck: Normal range of motion. Neck supple.  Cardiovascular: Regular rhythm and normal heart sounds.  Tachycardia present.   Pulmonary/Chest: Effort normal and breath sounds normal. No respiratory distress. She has no wheezes. She has no rales.  Abdominal: Soft. She exhibits no distension. There is no tenderness.  Musculoskeletal: Normal range of motion. She exhibits tenderness. She exhibits no edema.  Full ROM upper and lower extremities with diffuse tenderness to light touch.  Neurological: She is alert.  Skin: Skin is warm and dry. No rash noted. She is not diaphoretic. No erythema.  Psychiatric: Her mood appears anxious ( Tearful).  Nursing note and vitals reviewed.   ED Course  Procedures (including critical care time)  Labs Review Labs Reviewed - No data to display  Imaging Review No results found.   MDM   1. Body aches   2. Pain   3. History of fibromyalgia    Pt c/o diffuse body aches after stopping prednisone yesterday per recommendation by her orthopedist.   Celebrex has been called in for pt however, pt was not aware of the recent prescription as it occurred just 1hr PTA to [facility]  Pt plans to pick it up on her way home.    Pt tearful on exam, mildly anxious.  Hx of similar pain, pt believes is nerve pain but unsure how to take her Gabapentin.  Advised pt she may take 300mg  today, 300mg  BID tomorrow, and TID on the third day.  May continue taking TID unless advised  otherwise by neurosurgeon or her orthopedist.    Also encouraged pt to f/u with PCP as some pain may be secondary to underlying anxiety.    Pt tachycardic in UC, however, pt is anxious, and has hx of same. Denies chest pain or SOB.  Toradol 60mg  given in UC.  Encouraged to start her Celebrex tomorrow morning but may start Gabapentin tonight. Patient verbalized understanding and agreement with treatment plan.     Noland Fordyce, PA-C 07/02/15 [phone]

## 2015-07-03 ENCOUNTER — Ambulatory Visit (INDEPENDENT_AMBULATORY_CARE_PROVIDER_SITE_OTHER): Payer: Medicaid Other | Admitting: Family Medicine

## 2015-07-03 ENCOUNTER — Encounter: Payer: Self-pay | Admitting: Family Medicine

## 2015-07-03 VITALS — BP 125/50 | HR 97 | Ht 60.0 in | Wt 132.0 lb

## 2015-07-03 DIAGNOSIS — M791 Myalgia: Secondary | ICD-10-CM

## 2015-07-03 DIAGNOSIS — R208 Other disturbances of skin sensation: Secondary | ICD-10-CM

## 2015-07-03 DIAGNOSIS — M7918 Myalgia, other site: Secondary | ICD-10-CM

## 2015-07-03 DIAGNOSIS — R200 Anesthesia of skin: Secondary | ICD-10-CM

## 2015-07-03 MED ORDER — PREGABALIN 50 MG PO CAPS: 50.0000 mg | ORAL_CAPSULE | Freq: Two times a day (BID) | ORAL | Status: DC

## 2015-07-03 MED ORDER — KETOROLAC TROMETHAMINE 10 MG PO TABS: 10.0000 mg | ORAL_TABLET | Freq: Three times a day (TID) | ORAL | Status: DC | PRN

## 2015-07-03 MED ORDER — KETOROLAC TROMETHAMINE 60 MG/2ML IM SOLN
60.0000 mg | Freq: Once | INTRAMUSCULAR | Status: AC
  Administered 2015-07-03: 60 mg via INTRAMUSCULAR

## 2015-07-03 NOTE — Progress Notes (Signed)
   Subjective:    Patient ID: [name], female    DOB: Apr 17, 1976, 40 y.o.   MRN: [id number]  HPI  She had carpal tunnel surgery last April on her left hand and saw her surgeon recently for peristant numbness.  Given Medrol dose Pack by her surgeon but feels like it was flaring her myofascial pain. Stated getting severe pain in her head that went into her neck and then into her shoulder and upper body.   Told to stop her prednisone and given celebrex to use.   She received a Toradol shot yesterday and did get some relief with that. She is feeling some better today. She is mostly here to try to get back on a regimen to help control her myofascial pain.   Review of Systems     Objective:   Physical Exam  Constitutional: She is oriented to person, place, and time. She appears well-developed and well-nourished.  HENT:  Head: Normocephalic and atraumatic.  Cardiovascular: Normal rate, regular rhythm and normal heart sounds.   Pulmonary/Chest: Effort normal and breath sounds normal.  Neurological: She is alert and oriented to person, place, and time.  Skin: Skin is warm and dry.  Psychiatric: She has a normal mood and affect. Her behavior is normal.          Assessment & Plan:  Myofascial pain syndrome - She's had a recent flare in symptoms which she feels was triggered by the prednisone taper pack. She would like to get back on one of her controller medications. She's tried gabapentin and Lyrica in the past and felt like Lyrica was the most effective. We'll start Lyrica with 50 mg twice a day and then taper up after one week. She will need follow-up with her primary care provider in 3-4 weeks. Her acute pain relief we will prescribe an NSAID as her orthopedist wants her to be on anti-inflammatory to reduce the swelling around the nerve in her hand to see if this helps with the numbness.  She would like to try the the the toradol tabs instead of the celebrex as she has taken them  before.  She was last prescribed a Toradol tabs in February 2016. Refill given today for 30 tabs use every 8 hours as needed. Warned her not to take this with the Celebrex or the meloxicam that she has on her medication list. Also not to take with IV person or Aleve. Explained that these are on the same family and can cause kidney injury if taken together.  Numbness in the left hand-will be addressed by her orthopedist. We will try to keep her on an NSAID until her follow-up appointment.  She did ask if she could have another Toradol injection today. She had one yesterday at urgent care around 5 PM and says it really did give her a lot of relief. I think that that's perfectly reasonable to give another injection here in the office today.

## 2015-07-07 MED FILL — CYCLOBENZAPRINE 10 MG TAB: 10 | 30 days supply | Qty: 90 | Fill #2

## 2015-07-07 MED FILL — METOPROLOL SUCC ER 100 MG T: 100 | 30 days supply | Qty: 30 | Fill #2

## 2015-07-07 MED FILL — clonazePAM 1 MG TABS: 1 | 30 days supply | Qty: 60 | Fill #2

## 2015-07-07 MED FILL — ZOLPIDEM TARTRATE 10 MG TAB: 10 | 30 days supply | Qty: 30 | Fill #4

## 2015-07-08 ENCOUNTER — Telehealth: Payer: Self-pay | Admitting: *Deleted

## 2015-07-08 MED FILL — LYRICA 50 MG CAPSULE: 50 | 28 days supply | Qty: 98 | Fill #0

## 2015-07-08 NOTE — Telephone Encounter (Signed)
PA initiated on Lyrica 50 mg through [facility] Tracks ref [id number]

## 2015-07-13 ENCOUNTER — Encounter (HOSPITAL_BASED_OUTPATIENT_CLINIC_OR_DEPARTMENT_OTHER): Payer: Self-pay | Admitting: *Deleted

## 2015-07-14 ENCOUNTER — Ambulatory Visit (HOSPITAL_BASED_OUTPATIENT_CLINIC_OR_DEPARTMENT_OTHER): Payer: Medicaid Other | Admitting: Certified Registered"

## 2015-07-14 ENCOUNTER — Other Ambulatory Visit: Payer: Self-pay | Admitting: Orthopedic Surgery

## 2015-07-14 ENCOUNTER — Ambulatory Visit (HOSPITAL_BASED_OUTPATIENT_CLINIC_OR_DEPARTMENT_OTHER)
Admission: RE | Admit: 2015-07-14 | Discharge: 2015-07-14 | Disposition: A | Discharge status: Home / Self Care | Payer: Medicaid Other | Source: Ambulatory Visit | Attending: Orthopedic Surgery | Admitting: Orthopedic Surgery

## 2015-07-14 ENCOUNTER — Encounter (HOSPITAL_BASED_OUTPATIENT_CLINIC_OR_DEPARTMENT_OTHER)
Admission: RE | Disposition: A | Discharge status: Home / Self Care | Payer: Self-pay | Source: Ambulatory Visit | Attending: Orthopedic Surgery

## 2015-07-14 ENCOUNTER — Encounter (HOSPITAL_BASED_OUTPATIENT_CLINIC_OR_DEPARTMENT_OTHER): Payer: Self-pay | Admitting: Certified Registered"

## 2015-07-14 DIAGNOSIS — F17210 Nicotine dependence, cigarettes, uncomplicated: Secondary | ICD-10-CM | POA: Diagnosis not present

## 2015-07-14 DIAGNOSIS — G5602 Carpal tunnel syndrome, left upper limb: Secondary | ICD-10-CM | POA: Insufficient documentation

## 2015-07-14 DIAGNOSIS — I10 Essential (primary) hypertension: Secondary | ICD-10-CM | POA: Diagnosis not present

## 2015-07-14 DIAGNOSIS — F418 Other specified anxiety disorders: Secondary | ICD-10-CM | POA: Insufficient documentation

## 2015-07-14 DIAGNOSIS — Z79899 Other long term (current) drug therapy: Secondary | ICD-10-CM | POA: Diagnosis not present

## 2015-07-14 DIAGNOSIS — M797 Fibromyalgia: Secondary | ICD-10-CM | POA: Insufficient documentation

## 2015-07-14 HISTORY — PX: CARPAL TUNNEL RELEASE: SHX101

## 2015-07-14 SURGERY — CARPAL TUNNEL RELEASE
Anesthesia: General | Site: Wrist | Laterality: Left

## 2015-07-14 MED ORDER — CEFAZOLIN SODIUM-DEXTROSE 2-3 GM-% IV SOLR
INTRAVENOUS | Status: DC | PRN
  Administered 2015-07-14: 2 g via INTRAVENOUS

## 2015-07-14 MED ORDER — GLYCOPYRROLATE 0.2 MG/ML IJ SOLN: 0.2000 mg | Freq: Once | INTRAMUSCULAR | Status: DC | PRN

## 2015-07-14 MED ORDER — LACTATED RINGERS IV SOLN
INTRAVENOUS | Status: DC
  Administered 2015-07-14 (×2): via INTRAVENOUS

## 2015-07-14 MED ORDER — HYDROMORPHONE HCL 1 MG/ML IJ SOLN
INTRAMUSCULAR | Status: AC
  Filled 2015-07-14: qty 1

## 2015-07-14 MED ORDER — SCOPOLAMINE 1 MG/3DAYS TD PT72
1.0000 | MEDICATED_PATCH | Freq: Once | TRANSDERMAL | Status: DC | PRN
  Administered 2015-07-14: 1.5 mg via TRANSDERMAL

## 2015-07-14 MED ORDER — MIDAZOLAM HCL 2 MG/2ML IJ SOLN
INTRAMUSCULAR | Status: AC
  Filled 2015-07-14: qty 2

## 2015-07-14 MED ORDER — BUPIVACAINE-EPINEPHRINE (PF) 0.5% -1:200000 IJ SOLN
INTRAMUSCULAR | Status: DC | PRN
  Administered 2015-07-14: 25 mL via PERINEURAL

## 2015-07-14 MED ORDER — SCOPOLAMINE 1 MG/3DAYS TD PT72
MEDICATED_PATCH | TRANSDERMAL | Status: AC
  Filled 2015-07-14: qty 1

## 2015-07-14 MED ORDER — LIDOCAINE HCL (CARDIAC) 20 MG/ML IV SOLN
INTRAVENOUS | Status: DC | PRN
  Administered 2015-07-14: 30 mg via INTRAVENOUS

## 2015-07-14 MED ORDER — DEXAMETHASONE SODIUM PHOSPHATE 10 MG/ML IJ SOLN
INTRAMUSCULAR | Status: AC
  Filled 2015-07-14: qty 1

## 2015-07-14 MED ORDER — OXYCODONE-ACETAMINOPHEN 7.5-325 MG PO TABS: 1.0000 | ORAL_TABLET | ORAL | Status: DC | PRN

## 2015-07-14 MED ORDER — OXYCODONE HCL 5 MG/5ML PO SOLN: 5.0000 mg | Freq: Once | ORAL | Status: AC | PRN

## 2015-07-14 MED ORDER — ARTIFICIAL TEARS OP OINT
TOPICAL_OINTMENT | OPHTHALMIC | Status: AC
  Filled 2015-07-14: qty 3.5

## 2015-07-14 MED ORDER — EPHEDRINE SULFATE 50 MG/ML IJ SOLN
INTRAMUSCULAR | Status: AC
  Filled 2015-07-14: qty 1

## 2015-07-14 MED ORDER — OXYCODONE HCL 5 MG PO TABS
ORAL_TABLET | ORAL | Status: AC
  Filled 2015-07-14: qty 1

## 2015-07-14 MED ORDER — FENTANYL CITRATE (PF) 100 MCG/2ML IJ SOLN
50.0000 ug | INTRAMUSCULAR | Status: DC | PRN
  Administered 2015-07-14: 100 ug via INTRAVENOUS

## 2015-07-14 MED ORDER — SODIUM CHLORIDE 0.9 % IN NEBU
INHALATION_SOLUTION | RESPIRATORY_TRACT | Status: AC
  Filled 2015-07-14: qty 3

## 2015-07-14 MED ORDER — OXYCODONE HCL 5 MG PO TABS
5.0000 mg | ORAL_TABLET | Freq: Once | ORAL | Status: AC | PRN
  Administered 2015-07-14: 5 mg via ORAL

## 2015-07-14 MED ORDER — LIDOCAINE HCL (CARDIAC) 20 MG/ML IV SOLN
INTRAVENOUS | Status: AC
  Filled 2015-07-14: qty 5

## 2015-07-14 MED ORDER — MIDAZOLAM HCL 2 MG/2ML IJ SOLN
1.0000 mg | INTRAMUSCULAR | Status: DC | PRN
  Administered 2015-07-14 (×2): 2 mg via INTRAVENOUS

## 2015-07-14 MED ORDER — ONDANSETRON HCL 4 MG/2ML IJ SOLN
INTRAMUSCULAR | Status: AC
  Filled 2015-07-14: qty 2

## 2015-07-14 MED ORDER — ONDANSETRON HCL 4 MG/2ML IJ SOLN
INTRAMUSCULAR | Status: DC | PRN
  Administered 2015-07-14: 4 mg via INTRAVENOUS
  Administered 2015-07-14: 2 mg via INTRAVENOUS

## 2015-07-14 MED ORDER — CEFAZOLIN SODIUM-DEXTROSE 2-3 GM-% IV SOLR
INTRAVENOUS | Status: AC
  Filled 2015-07-14: qty 50

## 2015-07-14 MED ORDER — FENTANYL CITRATE (PF) 100 MCG/2ML IJ SOLN
INTRAMUSCULAR | Status: AC
  Filled 2015-07-14: qty 2

## 2015-07-14 MED ORDER — MEPERIDINE HCL 25 MG/ML IJ SOLN: 6.2500 mg | INTRAMUSCULAR | Status: DC | PRN

## 2015-07-14 MED ORDER — HYDROMORPHONE HCL 1 MG/ML IJ SOLN
0.2500 mg | INTRAMUSCULAR | Status: DC | PRN
  Administered 2015-07-14 (×2): 0.5 mg via INTRAVENOUS

## 2015-07-14 MED ORDER — PROPOFOL 10 MG/ML IV BOLUS
INTRAVENOUS | Status: DC | PRN
  Administered 2015-07-14: 200 mg via INTRAVENOUS

## 2015-07-14 MED FILL — OXYCODONE-APAP 7.5-325 MG: 7.5-325 | 5 days supply | Qty: 30 | Fill #0

## 2015-07-14 SURGICAL SUPPLY — 39 items
BLADE SURG 15 STRL LF DISP TIS (BLADE) ×2 IMPLANT
BLADE SURG 15 STRL SS (BLADE) ×3
BNDG CMPR 9X4 STRL LF SNTH (GAUZE/BANDAGES/DRESSINGS) ×2
BNDG COHESIVE 3X5 TAN STRL LF (GAUZE/BANDAGES/DRESSINGS) ×3 IMPLANT
BNDG ESMARK 4X9 LF (GAUZE/BANDAGES/DRESSINGS) ×3 IMPLANT
BNDG GAUZE ELAST 4 BULKY (GAUZE/BANDAGES/DRESSINGS) ×3 IMPLANT
CHLORAPREP W/TINT 26ML (MISCELLANEOUS) ×3 IMPLANT
CORDS BIPOLAR (ELECTRODE) ×3 IMPLANT
COVER BACK TABLE 60X90IN (DRAPES) ×3 IMPLANT
COVER MAYO STAND STRL (DRAPES) ×3 IMPLANT
CUFF TOURNIQUET SINGLE 18IN (TOURNIQUET CUFF) ×3 IMPLANT
DRAPE EXTREMITY T 121X128X90 (DRAPE) ×3 IMPLANT
DRAPE SURG 17X23 STRL (DRAPES) ×3 IMPLANT
DRSG PAD ABDOMINAL 8X10 ST (GAUZE/BANDAGES/DRESSINGS) ×3 IMPLANT
GAUZE SPONGE 4X4 12PLY STRL (GAUZE/BANDAGES/DRESSINGS) ×3 IMPLANT
GAUZE XEROFORM 1X8 LF (GAUZE/BANDAGES/DRESSINGS) ×3 IMPLANT
GLOVE BIO SURGEON STRL SZ 6.5 (GLOVE) ×6 IMPLANT
GLOVE BIOGEL PI IND STRL 7.0 (GLOVE) ×6 IMPLANT
GLOVE BIOGEL PI IND STRL 8.5 (GLOVE) ×2 IMPLANT
GLOVE BIOGEL PI INDICATOR 7.0 (GLOVE) ×3
GLOVE BIOGEL PI INDICATOR 8.5 (GLOVE) ×1
GLOVE SURG ORTHO 8.0 STRL STRW (GLOVE) ×3 IMPLANT
GOWN STRL REUS W/ TWL LRG LVL3 (GOWN DISPOSABLE) ×4 IMPLANT
GOWN STRL REUS W/TWL LRG LVL3 (GOWN DISPOSABLE) ×6
GOWN STRL REUS W/TWL XL LVL3 (GOWN DISPOSABLE) ×3 IMPLANT
NEEDLE PRECISIONGLIDE 27X1.5 (NEEDLE) IMPLANT
NS IRRIG 1000ML POUR BTL (IV SOLUTION) ×3 IMPLANT
PACK BASIN DAY SURGERY FS (CUSTOM PROCEDURE TRAY) ×3 IMPLANT
PAD CAST 3X4 CTTN HI CHSV (CAST SUPPLIES) ×2 IMPLANT
PADDING CAST ABS 4INX4YD NS (CAST SUPPLIES) ×1
PADDING CAST ABS COTTON 4X4 ST (CAST SUPPLIES) ×2 IMPLANT
PADDING CAST COTTON 3X4 STRL (CAST SUPPLIES) ×2
STOCKINETTE 4X48 STRL (DRAPES) ×3 IMPLANT
SUT ETHILON 4 0 PS 2 18 (SUTURE) ×3 IMPLANT
SUT VICRYL 4-0 PS2 18IN ABS (SUTURE) ×3 IMPLANT
SYR BULB 3OZ (MISCELLANEOUS) ×3 IMPLANT
SYR CONTROL 10ML LL (SYRINGE) IMPLANT
TOWEL OR 17X24 6PK STRL BLUE (TOWEL DISPOSABLE) ×3 IMPLANT
UNDERPAD 30X30 (UNDERPADS AND DIAPERS) ×3 IMPLANT

## 2015-07-14 NOTE — Transfer of Care (Signed)
Immediate Anesthesia Transfer of Care Note  Patient: [name]  Procedure(s) Performed: Procedure(s): LEFT CARPAL TUNNEL RELEASE (Left)  Patient Location: PACU  Anesthesia Type:GA combined with regional for post-op pain  Level of Consciousness: awake and patient cooperative  Airway & Oxygen Therapy: Patient Spontanous Breathing and Patient connected to face mask oxygen  Post-op Assessment: Report given to RN and Post -op Vital signs reviewed and stable  Post vital signs: Reviewed and stable  Last Vitals:  Filed Vitals:   07/14/15 1120 07/14/15 1236  BP: 126/83   Pulse: 84 81  Temp:    Resp: 15 22    Complications: No apparent anesthesia complications

## 2015-07-14 NOTE — Op Note (Signed)
Dictation Number [id number]

## 2015-07-14 NOTE — Discharge Instructions (Addendum)

## 2015-07-14 NOTE — Progress Notes (Signed)
Assisted Dr. Crews with right, ultrasound guided, infraclavicular block. Side rails up, monitors on throughout procedure. See vital signs in flow sheet. Tolerated Procedure well. 
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
## 2015-07-14 NOTE — Anesthesia Procedure Notes (Addendum)
Anesthesia Regional Block:  Infraclavicular brachial plexus block  Pre-Anesthetic Checklist: ,, timeout performed, Correct Patient, Correct Site, Correct Laterality, Correct Procedure, Correct Position, site marked, Risks and benefits discussed,  Surgical consent,  Pre-op evaluation,  At surgeon's request and post-op pain management  Laterality: Left and Upper  Prep: chloraprep       Needles:  Injection technique: Single-shot  Needle Type: Echogenic Stimulator Needle     Needle Length: 5cm 5 cm Needle Gauge: 21 and 21 G    Additional Needles:  Procedures: ultrasound guided (picture in chart) Infraclavicular brachial plexus block Narrative:  Start time: 07/14/2015 11:15 AM End time: 07/14/2015 11:20 AM Injection made incrementally with aspirations every 5 mL.  Performed by: Personally  Anesthesiologist: CREWS, DAVID   Procedure Name: LMA Insertion Date/Time: 07/14/2015 12:00 PM Performed by: Alessia Gonsalez D Pre-anesthesia Checklist: Patient identified, Emergency Drugs available, Suction available and Patient being monitored Patient Re-evaluated:Patient Re-evaluated prior to inductionOxygen Delivery Method: Circle System Utilized Preoxygenation: Pre-oxygenation with 100% oxygen Intubation Type: IV induction Ventilation: Mask ventilation without difficulty LMA: LMA inserted LMA Size: 4.0 Number of attempts: 1 Airway Equipment and Method: Bite block Placement Confirmation: positive ETCO2 Tube secured with: Tape Dental Injury: Teeth and Oropharynx as per pre-operative assessment

## 2015-07-14 NOTE — Brief Op Note (Signed)
07/14/2015  12:32 PM  PATIENT:  [name]  40 y.o. female  PRE-OPERATIVE DIAGNOSIS:  Recurrent Left Carpal Tunnel Syndrome  G56.02  POST-OPERATIVE DIAGNOSIS:  Recurrent Left Carpal Tunnel Syndrome  PROCEDURE:  Procedure(s): LEFT CARPAL TUNNEL RELEASE (Left)  SURGEON:  Surgeon(s) and Role:    * Daryll Brod, MD - Primary  PHYSICIAN ASSISTANT:   ASSISTANTS: none   ANESTHESIA:   regional and general  EBL:  Total I/O In: 1000 [I.V.:1000] Out: -   BLOOD ADMINISTERED:none  DRAINS: none   LOCAL MEDICATIONS USED:  NONE  SPECIMEN:  No Specimen  DISPOSITION OF SPECIMEN:  N/A  COUNTS:  YES  TOURNIQUET:   Total Tourniquet Time Documented: Upper Arm (Left) - 22 minutes Total: Upper Arm (Left) - 22 minutes   DICTATION: .Other Dictation: Dictation Number [id number]  PLAN OF CARE: Discharge to home after PACU  PATIENT DISPOSITION:  PACU - hemodynamically stable.

## 2015-07-14 NOTE — Anesthesia Postprocedure Evaluation (Signed)
Anesthesia Post Note  Patient: [name]  Procedure(s) Performed: Procedure(s) (LRB): LEFT CARPAL TUNNEL RELEASE (Left)  Patient location during evaluation: PACU Anesthesia Type: General Level of consciousness: awake and alert Pain management: pain level controlled Vital Signs Assessment: post-procedure vital signs reviewed and stable Respiratory status: spontaneous breathing, nonlabored ventilation and respiratory function stable Cardiovascular status: blood pressure returned to baseline and stable Postop Assessment: no signs of nausea or vomiting Anesthetic complications: no    Last Vitals:  Filed Vitals:   07/14/15 1315 07/14/15 1330  BP: 116/89 131/82  Pulse: 93 93  Temp:  36.6 C  Resp: 23 18    Last Pain:  Filed Vitals:   07/14/15 1338  PainSc: 4                  [name] A

## 2015-07-14 NOTE — Anesthesia Preprocedure Evaluation (Addendum)
Anesthesia Evaluation  Patient identified by MRN, date of birth, ID band Patient awake    Reviewed: Allergy & Precautions, NPO status , Patient's Chart, lab work & pertinent test results  History of Anesthesia Complications (+) PONV  Airway Mallampati: II  TM Distance: >3 FB Neck ROM: Full    Dental  (+) Teeth Intact, Dental Advisory Given   Pulmonary asthma , Current Smoker,    breath sounds clear to auscultation       Cardiovascular hypertension, Pt. on home beta blockers + Peripheral Vascular Disease  + dysrhythmias  Rhythm:Regular Rate:Normal     Neuro/Psych  Headaches, PSYCHIATRIC DISORDERS Anxiety Depression    GI/Hepatic   Endo/Other    Renal/GU      Musculoskeletal  (+) Fibromyalgia -  Abdominal   Peds  Hematology   Anesthesia Other Findings   Reproductive/Obstetrics                            Anesthesia Physical Anesthesia Plan  ASA: II  Anesthesia Plan: General   Post-op Pain Management: MAC Combined w/ Regional for Post-op pain   Induction: Intravenous  Airway Management Planned: LMA  Additional Equipment:   Intra-op Plan:   Post-operative Plan: Extubation in OR  Informed Consent: I have reviewed the patients History and Physical, chart, labs and discussed the procedure including the risks, benefits and alternatives for the proposed anesthesia with the patient or authorized representative who has indicated his/her understanding and acceptance.   Dental advisory given  Plan Discussed with: CRNA, Anesthesiologist and Surgeon  Anesthesia Plan Comments:         Anesthesia Quick Evaluation

## 2015-07-15 ENCOUNTER — Encounter (HOSPITAL_BASED_OUTPATIENT_CLINIC_OR_DEPARTMENT_OTHER): Payer: Self-pay | Admitting: Orthopedic Surgery
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
## 2015-07-22 NOTE — Telephone Encounter (Signed)
Checked status through [place] [employer] and this med has been approved 07-08-2015 through 07-02-2016  Call ref # [id number]  Called and left message on patient's vm

## 2015-07-23 ENCOUNTER — Other Ambulatory Visit: Payer: Self-pay | Admitting: Family Medicine

## 2015-07-23 NOTE — Telephone Encounter (Signed)
Called Pt to discuss son's lab results. Son was diagnosed with Flu on 07/20/15 in office. Pt is now symptomatic. Will route to Provider in office to see if Tamiflu can be written.

## 2015-07-24 ENCOUNTER — Ambulatory Visit: Payer: Self-pay | Admitting: Physician Assistant

## 2015-07-24 MED ORDER — OSELTAMIVIR PHOSPHATE 75 MG PO CAPS: 75.0000 mg | ORAL_CAPSULE | Freq: Two times a day (BID) | ORAL | Status: DC

## 2015-07-29 MED FILL — OXYCODONE/APAP 5-325: 5-325 | 5 days supply | Qty: 30 | Fill #0

## 2015-08-07 ENCOUNTER — Encounter (HOSPITAL_BASED_OUTPATIENT_CLINIC_OR_DEPARTMENT_OTHER): Payer: Self-pay | Admitting: Orthopedic Surgery

## 2015-08-13 MED FILL — DULoxetine HCL 60 MG CPEP: 60 | 30 days supply | Qty: 30 | Fill #0

## 2015-08-13 MED FILL — METOPROLOL SUCC ER 100 MG T: 100 | 30 days supply | Qty: 30 | Fill #3

## 2015-08-13 MED FILL — ZOLPIDEM TARTRATE 10 MG TAB: 10 | 15 days supply | Qty: 15 | Fill #5

## 2015-08-13 MED FILL — clonazePAM 1 MG TABS: 1 | 30 days supply | Qty: 60 | Fill #0

## 2015-08-13 MED FILL — CYCLOBENZAPRINE 10 MG TAB: 10 | 30 days supply | Qty: 90 | Fill #0

## 2015-08-13 MED FILL — CeleBREX 400 MG CAPS: 400 | 30 days supply | Qty: 60 | Fill #0

## 2015-08-13 MED FILL — KETOROLAC 10 MG TABLET: 10 | 3 days supply | Qty: 10 | Fill #0

## 2015-08-17 MED FILL — tiZANidine HCL 4 MG TABS: 4 | 20 days supply | Qty: 60 | Fill #0

## 2015-08-26 ENCOUNTER — Ambulatory Visit: Payer: Self-pay | Admitting: Physician Assistant

## 2015-08-26 ENCOUNTER — Other Ambulatory Visit: Payer: Self-pay | Admitting: Neurological Surgery

## 2015-08-26 DIAGNOSIS — M542 Cervicalgia: Secondary | ICD-10-CM

## 2015-08-28 ENCOUNTER — Other Ambulatory Visit: Payer: Self-pay | Admitting: Physician Assistant

## 2015-08-28 ENCOUNTER — Ambulatory Visit (INDEPENDENT_AMBULATORY_CARE_PROVIDER_SITE_OTHER): Payer: Medicaid Other | Admitting: Physician Assistant

## 2015-08-28 ENCOUNTER — Other Ambulatory Visit: Payer: Self-pay

## 2015-08-28 ENCOUNTER — Encounter: Payer: Self-pay | Admitting: Physician Assistant

## 2015-08-28 VITALS — BP 117/75 | HR 107 | Ht 60.0 in | Wt 133.0 lb

## 2015-08-28 DIAGNOSIS — H21533 Iridodialysis, bilateral: Secondary | ICD-10-CM

## 2015-08-28 DIAGNOSIS — R300 Dysuria: Secondary | ICD-10-CM

## 2015-08-28 DIAGNOSIS — F411 Generalized anxiety disorder: Secondary | ICD-10-CM | POA: Diagnosis not present

## 2015-08-28 DIAGNOSIS — F32A Depression, unspecified: Secondary | ICD-10-CM

## 2015-08-28 DIAGNOSIS — R319 Hematuria, unspecified: Secondary | ICD-10-CM | POA: Diagnosis not present

## 2015-08-28 DIAGNOSIS — F329 Major depressive disorder, single episode, unspecified: Secondary | ICD-10-CM

## 2015-08-28 LAB — POCT URINALYSIS DIPSTICK
Bilirubin, UA: NEGATIVE
Glucose, UA: NEGATIVE
Ketones, UA: NEGATIVE
Leukocytes, UA: NEGATIVE
NITRITE UA: NEGATIVE
PROTEIN UA: NEGATIVE
SPEC GRAV UA: 1.025
UROBILINOGEN UA: 1
pH, UA: 7

## 2015-08-28 MED ORDER — PREGABALIN 150 MG PO CAPS: 150.0000 mg | ORAL_CAPSULE | Freq: Two times a day (BID) | ORAL | Status: DC

## 2015-08-28 MED ORDER — NITROFURANTOIN MONOHYD MACRO 100 MG PO CAPS: 100.0000 mg | ORAL_CAPSULE | Freq: Two times a day (BID) | ORAL | Status: DC

## 2015-08-30 LAB — URINE CULTURE: Colony Count: >=100000

## 2015-08-31 DIAGNOSIS — F32A Depression, unspecified: Secondary | ICD-10-CM | POA: Insufficient documentation

## 2015-08-31 DIAGNOSIS — R300 Dysuria: Secondary | ICD-10-CM | POA: Insufficient documentation

## 2015-08-31 DIAGNOSIS — R29818 Other symptoms and signs involving the nervous system: Secondary | ICD-10-CM | POA: Insufficient documentation

## 2015-08-31 DIAGNOSIS — F329 Major depressive disorder, single episode, unspecified: Secondary | ICD-10-CM | POA: Insufficient documentation

## 2015-08-31 NOTE — Progress Notes (Signed)
Subjective:    Patient ID: [name], female    DOB: 19-Feb-1976, 40 y.o.   MRN: [id number]  HPI Pt presents to the clinic to discuss fixed pupils found at disability exam. She has neurologist appt next week but wanted imaging before. No eye pain, headaches, seizures. No other symptoms.   She is taking lyrica and doing well needs refill for overall pain.   She is having dysuria and hematuria. Has IC. Unclear if infection or inflammation. No fever, chills, n/v. Not done anything to make better. Symptoms present for the last 2-3 days.    Review of Systems    see HPI.  Objective:   Physical Exam  Constitutional: She is oriented to person, place, and time. She appears well-developed and well-nourished.  HENT:  Head: Normocephalic and atraumatic.  Right Ear: External ear normal.  Left Ear: External ear normal.  Eyes: Conjunctivae and EOM are normal. Right eye exhibits no discharge. Left eye exhibits no discharge.  Bilateral pupils are fixed and dilated without response to light.   Neck: Normal range of motion. Neck supple.  Cardiovascular: Normal rate, regular rhythm and normal heart sounds.   Pulmonary/Chest: Effort normal and breath sounds normal.  Negative for bilateral CVA tenderness.   Lymphadenopathy:    She has no cervical adenopathy.  Neurological: She is alert and oriented to person, place, and time. She displays normal reflexes. No cranial nerve deficit. Coordination normal.  Absent antecubital reflexes.  Decreased bilateral sensation of feet.   Skin: Skin is dry.  Psychiatric: She has a normal mood and affect. Her behavior is normal.          Assessment & Plan:  Fixed pupils- continue to follow up with neurology. MRA ordered. Unclear etiology. Only other neurological finding was loss of sensation in her bilateral feet.   Dysuria/hematuria- .. Results for orders placed or performed in visit on 08/28/15  Urine Culture  Result Value Ref Range   Culture ESCHERICHIA COLI    Colony Count >=100,000 COLONIES/ML    Organism ID, Bacteria ESCHERICHIA COLI       Susceptibility   Escherichia coli -  (no method available)    AMPICILLIN <=2 Sensitive     AMOX/CLAVULANIC <=2 Sensitive     AMPICILLIN/SULBACTAM <=2 Sensitive     PIP/TAZO <=4 Sensitive     IMIPENEM <=0.25 Sensitive     CEFAZOLIN <=4 Not Reportable     CEFTRIAXONE <=1 Sensitive     CEFTAZIDIME <=1 Sensitive     CEFEPIME <=1 Sensitive     GENTAMICIN <=1 Sensitive     TOBRAMYCIN <=1 Sensitive     CIPROFLOXACIN <=0.25 Sensitive     LEVOFLOXACIN <=0.12 Sensitive     NITROFURANTOIN 32 Sensitive     TRIMETH/SULFA* <=20 Sensitive      * NR=NOT REPORTABLE,SEE COMMENTORAL therapy:A cefazolin MIC of <32 predicts susceptibility to the oral agents cefaclor,cefdinir,cefpodoxime,cefprozil,cefuroxime,cephalexin,and loracarbef when used for therapy of uncomplicated UTIs due to E.coli,K.pneumomiae,and P.mirabilis. PARENTERAL therapy: A cefazolinMIC of >8 indicates resistance to parenteralcefazolin. An alternate test method must beperformed to confirm susceptibility to parenteralcefazolin.  POCT urinalysis dipstick  Result Value Ref Range   Color, UA dark yellow    Clarity, UA clear    Glucose, UA neg    Bilirubin, UA neg    Ketones, UA neg    Spec Grav, UA 1.025    Blood, UA small    pH, UA 7.0    Protein, UA neg    Urobilinogen, UA 1.0  Nitrite, UA neg    Leukocytes, UA Negative Negative   macrobid should treat. Encouraged hydration and rest.   Anxiety and depression- needs someone to talk to. She has a lot of health concerns and the fact she may never be able to work again is mentally hard. Referral made for counseling.   Myofascial pain- lyrica refilled at 150mg  bid.

## 2015-09-07 ENCOUNTER — Other Ambulatory Visit: Payer: Self-pay

## 2015-09-07 ENCOUNTER — Inpatient Hospital Stay: Admission: RE | Admit: 2015-09-07 | Payer: Medicaid Other | Source: Ambulatory Visit

## 2015-09-07 MED FILL — tiZANidine HCL 4 MG TABS: 4 | 20 days supply | Qty: 60 | Fill #0

## 2015-09-07 MED FILL — ZOLPIDEM TARTRATE 10 MG TAB: 10 | 30 days supply | Qty: 30 | Fill #0

## 2015-09-14 ENCOUNTER — Other Ambulatory Visit: Payer: Self-pay

## 2015-09-16 ENCOUNTER — Inpatient Hospital Stay: Admission: RE | Admit: 2015-09-16 | Payer: Medicaid Other | Source: Ambulatory Visit

## 2015-09-16 MED FILL — CeleBREX 400 MG CAPS: 400 | 30 days supply | Qty: 60 | Fill #0

## 2015-09-21 ENCOUNTER — Other Ambulatory Visit: Payer: Self-pay | Admitting: Neurological Surgery

## 2015-09-21 DIAGNOSIS — M542 Cervicalgia: Secondary | ICD-10-CM

## 2015-09-21 MED FILL — clonazePAM 1 MG TABS: 1 | 30 days supply | Qty: 60 | Fill #1 | Status: TO

## 2015-09-21 MED FILL — DULoxetine HCL 60 MG CPEP: 60 | 30 days supply | Qty: 30 | Fill #1

## 2015-09-21 MED FILL — METOPROLOL SUCC ER 100 MG T: 100 | 30 days supply | Qty: 30 | Fill #4

## 2015-09-25 ENCOUNTER — Encounter: Payer: Self-pay | Admitting: Physician Assistant

## 2015-09-25 ENCOUNTER — Ambulatory Visit (INDEPENDENT_AMBULATORY_CARE_PROVIDER_SITE_OTHER): Payer: Medicaid Other | Admitting: Physician Assistant

## 2015-09-25 VITALS — BP 115/78 | HR 80 | Ht 60.0 in | Wt 137.0 lb

## 2015-09-25 DIAGNOSIS — G5602 Carpal tunnel syndrome, left upper limb: Secondary | ICD-10-CM | POA: Diagnosis not present

## 2015-09-25 DIAGNOSIS — M542 Cervicalgia: Secondary | ICD-10-CM | POA: Diagnosis not present

## 2015-09-25 DIAGNOSIS — M7918 Myalgia, other site: Secondary | ICD-10-CM

## 2015-09-25 DIAGNOSIS — R202 Paresthesia of skin: Secondary | ICD-10-CM

## 2015-09-25 DIAGNOSIS — M791 Myalgia: Secondary | ICD-10-CM

## 2015-09-25 MED ORDER — BUSPIRONE HCL 7.5 MG PO TABS: 7.5000 mg | ORAL_TABLET | Freq: Two times a day (BID) | ORAL | Status: DC

## 2015-09-25 MED ORDER — TIZANIDINE HCL 4 MG PO CAPS: 4.0000 mg | ORAL_CAPSULE | Freq: Three times a day (TID) | ORAL | Status: DC

## 2015-09-25 MED ORDER — KETOROLAC TROMETHAMINE 10 MG PO TABS: 10.0000 mg | ORAL_TABLET | Freq: Three times a day (TID) | ORAL | Status: DC | PRN

## 2015-09-25 MED ORDER — KETOROLAC TROMETHAMINE 60 MG/2ML IM SOLN
60.0000 mg | Freq: Once | INTRAMUSCULAR | Status: AC
  Administered 2015-09-25: 60 mg via INTRAMUSCULAR
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
## 2015-09-26 ENCOUNTER — Ambulatory Visit (HOSPITAL_BASED_OUTPATIENT_CLINIC_OR_DEPARTMENT_OTHER)
Admission: RE | Admit: 2015-09-26 | Discharge: 2015-09-26 | Disposition: A | Discharge status: Home / Self Care | Payer: Medicaid Other | Source: Ambulatory Visit | Attending: Physician Assistant | Admitting: Physician Assistant

## 2015-09-26 DIAGNOSIS — I651 Occlusion and stenosis of basilar artery: Secondary | ICD-10-CM | POA: Diagnosis present

## 2015-09-26 DIAGNOSIS — I6629 Occlusion and stenosis of unspecified posterior cerebral artery: Secondary | ICD-10-CM | POA: Diagnosis not present

## 2015-10-05 ENCOUNTER — Ambulatory Visit
Admission: RE | Admit: 2015-10-05 | Discharge: 2015-10-05 | Disposition: A | Discharge status: Home / Self Care | Payer: Medicaid Other | Source: Ambulatory Visit | Attending: Neurological Surgery | Admitting: Neurological Surgery

## 2015-10-05 DIAGNOSIS — M542 Cervicalgia: Secondary | ICD-10-CM

## 2015-10-05 MED ORDER — TRIAMCINOLONE ACETONIDE 40 MG/ML IJ SUSP (RADIOLOGY)
60.0000 mg | Freq: Once | INTRAMUSCULAR | Status: AC
  Administered 2015-10-05: 60 mg via EPIDURAL

## 2015-10-05 MED ORDER — IOPAMIDOL (ISOVUE-300) INJECTION 61%
1.0000 mL | Freq: Once | INTRAVENOUS | Status: AC | PRN
  Administered 2015-10-05: 1 mL

## 2015-10-07 MED FILL — ZOLPIDEM TARTRATE 10 MG TAB: 10 | 30 days supply | Qty: 30 | Fill #1

## 2015-10-29 ENCOUNTER — Other Ambulatory Visit: Payer: Self-pay | Admitting: Neurological Surgery

## 2015-10-29 DIAGNOSIS — M542 Cervicalgia: Secondary | ICD-10-CM

## 2015-11-09 ENCOUNTER — Ambulatory Visit (INDEPENDENT_AMBULATORY_CARE_PROVIDER_SITE_OTHER): Payer: Medicaid Other

## 2015-11-09 ENCOUNTER — Telehealth: Payer: Self-pay

## 2015-11-09 ENCOUNTER — Other Ambulatory Visit: Payer: Self-pay

## 2015-11-09 DIAGNOSIS — M542 Cervicalgia: Secondary | ICD-10-CM

## 2015-11-09 DIAGNOSIS — Z981 Arthrodesis status: Secondary | ICD-10-CM | POA: Diagnosis not present

## 2015-11-09 DIAGNOSIS — R600 Localized edema: Secondary | ICD-10-CM | POA: Diagnosis not present

## 2015-11-09 MED ORDER — ZOLPIDEM TARTRATE 10 MG PO TABS: 10.0000 mg | ORAL_TABLET | Freq: Every evening | ORAL | Status: DC | PRN

## 2015-11-09 NOTE — Telephone Encounter (Signed)
Yes go ahead and give her 30 tablets.

## 2015-11-10 ENCOUNTER — Encounter: Payer: Self-pay | Admitting: Physician Assistant

## 2015-11-10 ENCOUNTER — Ambulatory Visit: Payer: Self-pay | Admitting: Physician Assistant

## 2015-11-10 ENCOUNTER — Ambulatory Visit (INDEPENDENT_AMBULATORY_CARE_PROVIDER_SITE_OTHER): Payer: Medicaid Other | Admitting: Physician Assistant

## 2015-11-10 VITALS — BP 111/63 | HR 78 | Wt 140.0 lb

## 2015-11-10 DIAGNOSIS — R202 Paresthesia of skin: Secondary | ICD-10-CM

## 2015-11-10 DIAGNOSIS — G4700 Insomnia, unspecified: Secondary | ICD-10-CM

## 2015-11-10 DIAGNOSIS — M5010 Cervical disc disorder with radiculopathy, unspecified cervical region: Secondary | ICD-10-CM | POA: Diagnosis not present

## 2015-11-10 MED ORDER — PREGABALIN 150 MG PO CAPS: 150.0000 mg | ORAL_CAPSULE | Freq: Two times a day (BID) | ORAL | Status: DC

## 2015-11-10 MED ORDER — DULOXETINE HCL 60 MG PO CPEP: 60.0000 mg | ORAL_CAPSULE | Freq: Every day | ORAL | Status: DC

## 2015-11-10 MED ORDER — CELECOXIB 400 MG PO CAPS: 400.0000 mg | ORAL_CAPSULE | Freq: Two times a day (BID) | ORAL | Status: DC

## 2015-11-10 MED ORDER — BUSPIRONE HCL 7.5 MG PO TABS: 7.5000 mg | ORAL_TABLET | Freq: Two times a day (BID) | ORAL | Status: DC

## 2015-11-10 MED ORDER — KETOROLAC TROMETHAMINE 60 MG/2ML IM SOLN
60.0000 mg | Freq: Once | INTRAMUSCULAR | Status: AC
  Administered 2015-11-10: 60 mg via INTRAMUSCULAR

## 2015-11-10 MED ORDER — KETOROLAC TROMETHAMINE 10 MG PO TABS: 10.0000 mg | ORAL_TABLET | Freq: Three times a day (TID) | ORAL | Status: DC | PRN

## 2015-11-10 MED ORDER — ZOLPIDEM TARTRATE 10 MG PO TABS: 10.0000 mg | ORAL_TABLET | Freq: Every evening | ORAL | Status: DC | PRN

## 2015-11-10 MED ORDER — METOPROLOL SUCCINATE ER 100 MG PO TB24: 100.0000 mg | ORAL_TABLET | Freq: Every day | ORAL | Status: DC
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
## 2015-11-11 DIAGNOSIS — M5010 Cervical disc disorder with radiculopathy, unspecified cervical region: Secondary | ICD-10-CM | POA: Insufficient documentation

## 2015-11-18 ENCOUNTER — Telehealth: Payer: Self-pay | Admitting: *Deleted

## 2015-11-18 NOTE — Telephone Encounter (Signed)
PA submitted through [place] [employer] for this med and has been approved for  [facility]  Approval # [id number]  Patient notified. Also let her know that the brand name is preferred through [facility]

## 2015-11-23 ENCOUNTER — Other Ambulatory Visit: Payer: Self-pay | Admitting: Physician Assistant

## 2015-12-09 ENCOUNTER — Other Ambulatory Visit: Payer: Self-pay | Admitting: Physician Assistant

## 2015-12-21 ENCOUNTER — Other Ambulatory Visit: Payer: Self-pay | Admitting: Physician Assistant

## 2016-01-13 ENCOUNTER — Encounter: Payer: Self-pay | Admitting: Physician Assistant

## 2016-01-13 ENCOUNTER — Ambulatory Visit (INDEPENDENT_AMBULATORY_CARE_PROVIDER_SITE_OTHER): Payer: Medicaid Other | Admitting: Physician Assistant

## 2016-01-13 VITALS — BP 109/58 | HR 90 | Wt 138.0 lb

## 2016-01-13 DIAGNOSIS — F329 Major depressive disorder, single episode, unspecified: Secondary | ICD-10-CM | POA: Diagnosis not present

## 2016-01-13 DIAGNOSIS — M5010 Cervical disc disorder with radiculopathy, unspecified cervical region: Secondary | ICD-10-CM | POA: Diagnosis not present

## 2016-01-13 DIAGNOSIS — F32A Depression, unspecified: Secondary | ICD-10-CM

## 2016-01-13 DIAGNOSIS — N898 Other specified noninflammatory disorders of vagina: Secondary | ICD-10-CM

## 2016-01-13 DIAGNOSIS — F411 Generalized anxiety disorder: Secondary | ICD-10-CM

## 2016-01-13 MED ORDER — LAMOTRIGINE 25 MG PO TABS: ORAL_TABLET | ORAL | 0 refills | Status: DC

## 2016-01-13 MED ORDER — KETOROLAC TROMETHAMINE 60 MG/2ML IM SOLN
60.0000 mg | Freq: Once | INTRAMUSCULAR | Status: AC
  Administered 2016-01-13: 60 mg via INTRAMUSCULAR

## 2016-01-13 MED ORDER — BUSPIRONE HCL 10 MG PO TABS: 10.0000 mg | ORAL_TABLET | Freq: Three times a day (TID) | ORAL | 2 refills | Status: DC

## 2016-01-13 MED ORDER — KETOROLAC TROMETHAMINE 10 MG PO TABS: 10.0000 mg | ORAL_TABLET | Freq: Three times a day (TID) | ORAL | 1 refills | Status: DC | PRN

## 2016-01-13 MED ORDER — TIZANIDINE HCL 4 MG PO CAPS: 4.0000 mg | ORAL_CAPSULE | Freq: Three times a day (TID) | ORAL | 1 refills | Status: DC
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
## 2016-01-13 NOTE — Patient Instructions (Addendum)
Phone: [phone] Dr. Cheryln Manly.

## 2016-01-14 ENCOUNTER — Telehealth: Payer: Self-pay

## 2016-01-14 MED ORDER — PROMETHAZINE HCL 25 MG PO TABS: 25.0000 mg | ORAL_TABLET | Freq: Three times a day (TID) | ORAL | 0 refills | Status: DC | PRN

## 2016-01-14 NOTE — Telephone Encounter (Signed)
Tara was here yesterday to see Luvenia Starch. She reported not feeling well. She now has nausea, vomiting, fever and chills. She would like a prescription of phenergan sent to the pharmacy.   [facility]

## 2016-01-14 NOTE — Telephone Encounter (Signed)
Sent rx. Needs appt if not better in 24 hours.

## 2016-01-15 NOTE — Telephone Encounter (Signed)
Left message advising of recommendations.  

## 2016-01-15 NOTE — Telephone Encounter (Signed)
Patient picked up prescription yesterday.

## 2016-01-17 LAB — RFX HSV/VARICELLA ZOSTER RAPID CULT

## 2016-01-19 ENCOUNTER — Other Ambulatory Visit: Payer: Self-pay | Admitting: Neurological Surgery

## 2016-01-19 DIAGNOSIS — M542 Cervicalgia: Secondary | ICD-10-CM

## 2016-01-20 LAB — CYTOMEGALOVIRUS CULTURE

## 2016-01-20 LAB — VIRAL CULTURE VIRC

## 2016-01-20 LAB — RAPID VIRAL RESP CULT SCRN W/ RFLX

## 2016-01-21 ENCOUNTER — Other Ambulatory Visit: Payer: Self-pay | Admitting: Physician Assistant

## 2016-01-22 ENCOUNTER — Encounter: Payer: Self-pay | Admitting: Family Medicine

## 2016-01-22 ENCOUNTER — Ambulatory Visit (INDEPENDENT_AMBULATORY_CARE_PROVIDER_SITE_OTHER): Payer: Medicaid Other | Admitting: Family Medicine

## 2016-01-22 VITALS — BP 106/63 | HR 72 | Temp 98.1°F | Wt 138.0 lb

## 2016-01-22 DIAGNOSIS — A084 Viral intestinal infection, unspecified: Secondary | ICD-10-CM | POA: Diagnosis not present

## 2016-01-22 MED ORDER — CLONAZEPAM 1 MG PO TABS
1.0000 mg | ORAL_TABLET | Freq: Two times a day (BID) | ORAL | 0 refills | Status: DC
Start: 2016-01-22 — End: 2016-02-15

## 2016-01-22 MED ORDER — PROMETHAZINE HCL 25 MG PO TABS: 25.0000 mg | ORAL_TABLET | Freq: Three times a day (TID) | ORAL | 0 refills | Status: DC | PRN

## 2016-01-22 MED ORDER — PROMETHAZINE HCL 25 MG/ML IJ SOLN
25.0000 mg | Freq: Once | INTRAMUSCULAR | Status: AC
  Administered 2016-01-22: 25 mg via INTRAMUSCULAR

## 2016-01-22 NOTE — Progress Notes (Signed)
Subjective:    CC: N/V started yesterday.   HPI:  N/V started yesterday. Along with diarrhea.  No fever, etc.  Similar sxs about 2 weeks agoThat lasted about 2 days. Diffuse abdominal pain, not localized.  No blood in the urine or stool. She just felt extremely fatigued. She has had some chills. No known sick contacts.  Past medical history, Surgical history, Family history not pertinant except as noted below, Social history, Allergies, and medications have been entered into the medical record, reviewed, and corrections made.   Review of Systems: No fevers, chills, night sweats, weight loss, chest pain, or shortness of breath.   Objective:    General: Well Developed, well nourished, and in no acute distress.  Neuro: Alert and oriented x3, extra-ocular muscles intact, sensation grossly intact.  HEENT: Normocephalic, atraumatic  Skin: Warm and dry, no rashes. Cardiac: Regular rate and rhythm, no murmurs rubs or gallops, no lower extremity edema.  Respiratory: Clear to auscultation bilaterally. Not using accessory muscles, speaking in full sentences. Abd: soft, NT, no OM   Impression and Recommendations:   Viral gastroenteritis - call if not better in 2-3 days.  I am Phenergan given here in the office today.  Prescriptionsent to the pharmacy as well. Really push oral hydration today. Call if getting worse or not improving. Also encouraged her stop all NSAIDs for the next few days to allow her stomach time to heal.

## 2016-01-28 ENCOUNTER — Ambulatory Visit: Payer: Self-pay | Admitting: Psychology

## 2016-02-02 ENCOUNTER — Other Ambulatory Visit: Payer: Self-pay

## 2016-02-15 ENCOUNTER — Other Ambulatory Visit: Payer: Self-pay | Admitting: Physician Assistant

## 2016-02-17 ENCOUNTER — Ambulatory Visit (INDEPENDENT_AMBULATORY_CARE_PROVIDER_SITE_OTHER): Payer: Self-pay | Admitting: Psychology

## 2016-02-17 DIAGNOSIS — F330 Major depressive disorder, recurrent, mild: Secondary | ICD-10-CM

## 2016-02-17 DIAGNOSIS — F411 Generalized anxiety disorder: Secondary | ICD-10-CM

## 2016-02-17 DIAGNOSIS — F4310 Post-traumatic stress disorder, unspecified: Secondary | ICD-10-CM

## 2016-02-18 ENCOUNTER — Ambulatory Visit: Payer: Self-pay | Admitting: Sports Medicine

## 2016-02-23 ENCOUNTER — Inpatient Hospital Stay
Admission: RE | Admit: 2016-02-23 | Discharge: 2016-02-23 | Disposition: A | Discharge status: Home / Self Care | Payer: Self-pay | Source: Ambulatory Visit | Attending: Neurological Surgery | Admitting: Neurological Surgery

## 2016-02-23 NOTE — Discharge Instructions (Signed)

## 2016-03-01 ENCOUNTER — Ambulatory Visit: Payer: Self-pay | Admitting: Physician Assistant

## 2016-03-01 ENCOUNTER — Telehealth: Payer: Self-pay | Admitting: Physician Assistant

## 2016-03-01 NOTE — Telephone Encounter (Signed)
Patient called in to ask for a refill on Lamotrigine. She stated she felt it was working well but she has been out of it for a few days. Thanks!

## 2016-03-02 ENCOUNTER — Other Ambulatory Visit: Payer: Self-pay | Admitting: *Deleted

## 2016-03-02 MED ORDER — LAMOTRIGINE 100 MG PO TABS: ORAL_TABLET | ORAL | 1 refills | Status: DC

## 2016-03-02 NOTE — Telephone Encounter (Signed)
Refill sent to pharmacy.   

## 2016-03-04 ENCOUNTER — Ambulatory Visit
Admission: RE | Admit: 2016-03-04 | Discharge: 2016-03-04 | Disposition: A | Discharge status: Home / Self Care | Payer: Medicaid Other | Source: Ambulatory Visit | Attending: Neurological Surgery | Admitting: Neurological Surgery

## 2016-03-04 DIAGNOSIS — M542 Cervicalgia: Secondary | ICD-10-CM

## 2016-03-04 MED ORDER — IOPAMIDOL (ISOVUE-M 200) INJECTION 41%
1.0000 mL | Freq: Once | INTRAMUSCULAR | Status: AC
  Administered 2016-03-04: 1 mL via EPIDURAL

## 2016-03-04 MED ORDER — METHYLPREDNISOLONE ACETATE 40 MG/ML INJ SUSP (RADIOLOG
120.0000 mg | Freq: Once | INTRAMUSCULAR | Status: AC
  Administered 2016-03-04: 120 mg via EPIDURAL

## 2016-03-04 NOTE — Discharge Instructions (Signed)

## 2016-03-08 ENCOUNTER — Ambulatory Visit: Payer: Self-pay | Admitting: Psychology

## 2016-03-15 ENCOUNTER — Ambulatory Visit: Payer: Self-pay | Admitting: Psychology

## 2016-03-22 ENCOUNTER — Ambulatory Visit: Payer: Self-pay | Admitting: Psychology

## 2016-03-23 ENCOUNTER — Other Ambulatory Visit: Payer: Self-pay | Admitting: Family Medicine

## 2016-03-29 ENCOUNTER — Ambulatory Visit: Payer: Medicaid Other | Admitting: Psychology

## 2016-04-04 ENCOUNTER — Encounter: Payer: Self-pay | Admitting: Sports Medicine

## 2016-04-04 ENCOUNTER — Ambulatory Visit (INDEPENDENT_AMBULATORY_CARE_PROVIDER_SITE_OTHER): Payer: Medicaid Other | Admitting: Sports Medicine

## 2016-04-04 ENCOUNTER — Ambulatory Visit (INDEPENDENT_AMBULATORY_CARE_PROVIDER_SITE_OTHER): Payer: Medicaid Other

## 2016-04-04 VITALS — BP 121/78 | HR 75 | Temp 98.0°F | Resp 16 | Wt 141.0 lb

## 2016-04-04 DIAGNOSIS — R05 Cough: Secondary | ICD-10-CM

## 2016-04-04 DIAGNOSIS — F17200 Nicotine dependence, unspecified, uncomplicated: Secondary | ICD-10-CM

## 2016-04-04 DIAGNOSIS — R509 Fever, unspecified: Secondary | ICD-10-CM

## 2016-04-04 DIAGNOSIS — J069 Acute upper respiratory infection, unspecified: Secondary | ICD-10-CM

## 2016-04-04 DIAGNOSIS — M25542 Pain in joints of left hand: Secondary | ICD-10-CM | POA: Diagnosis not present

## 2016-04-04 DIAGNOSIS — M1812 Unilateral primary osteoarthritis of first carpometacarpal joint, left hand: Secondary | ICD-10-CM | POA: Diagnosis not present

## 2016-04-04 DIAGNOSIS — M19032 Primary osteoarthritis, left wrist: Secondary | ICD-10-CM | POA: Diagnosis not present

## 2016-04-04 DIAGNOSIS — M19042 Primary osteoarthritis, left hand: Secondary | ICD-10-CM | POA: Insufficient documentation

## 2016-04-04 MED ORDER — KETOROLAC TROMETHAMINE 30 MG/ML IJ SOLN
30.0000 mg | Freq: Once | INTRAMUSCULAR | Status: AC
  Administered 2016-04-04: 30 mg via INTRAMUSCULAR

## 2016-04-04 MED ORDER — ALBUTEROL SULFATE HFA 108 (90 BASE) MCG/ACT IN AERS: 1.0000 | INHALATION_SPRAY | Freq: Four times a day (QID) | RESPIRATORY_TRACT | 0 refills | Status: DC | PRN

## 2016-04-04 MED ORDER — AZITHROMYCIN 250 MG PO TABS: ORAL_TABLET | ORAL | 0 refills | Status: DC

## 2016-04-04 MED ORDER — PROMETHAZINE HCL 25 MG PO TABS: 12.5000 mg | ORAL_TABLET | Freq: Four times a day (QID) | ORAL | 0 refills | Status: DC | PRN

## 2016-04-04 NOTE — Progress Notes (Signed)
Procedure: Real-time Ultrasound Guided Injection of left trapeziometacarpal joint Device: GE Logiq E  Verbal informed consent obtained.  Time-out conducted.  Noted no overlying erythema, induration, or other signs of local infection.  Skin prepped in a sterile fashion.  Local anesthesia: Topical Ethyl chloride.  With sterile technique and under real time ultrasound guidance: Using a 30-gauge needle advanced into the joint and injected 1/2 mL kenalog 40, 1/2 mL lidocaine.  Completed without difficulty  Pain immediately resolved suggesting accurate placement of the medication.  Advised to call if fevers/chills, erythema, induration, drainage, or persistent bleeding.  Images permanently stored and available for review in the ultrasound unit.  Impression: Technically successful ultrasound guided injection.

## 2016-04-04 NOTE — Progress Notes (Signed)
   Subjective:    Patient ID: [name], female    DOB: Oct 26, 1975, 40 y.o.   MRN: [id number]  Sinus Problem  This is a recurrent problem. Episode onset: began 2 weeks ago. The problem is unchanged. Maximum temperature: tactile fever at night. Associated symptoms include chills, congestion, coughing (productive of sputum in the mornings), diaphoresis, headaches (frontal), sneezing and a sore throat. Pertinent negatives include no shortness of breath or sinus pressure. Past treatments include oral decongestants (Alka Seltzer, Mucinex, humidifier). The treatment provided no relief.      Review of Systems  Constitutional: Positive for chills and diaphoresis.  HENT: Positive for congestion, sneezing and sore throat. Negative for sinus pressure.   Respiratory: Positive for cough (productive of sputum in the mornings). Negative for shortness of breath.   Neurological: Positive for headaches (frontal).       Objective:   Physical Exam  Constitutional: Vital signs are normal. She appears ill. No distress.  HENT:  Nose: Mucosal edema present.  Eyes: Conjunctivae, EOM and lids are normal.  Neck: Normal range of motion. Neck supple.  Cardiovascular: Normal rate and regular rhythm.   No murmur heard. Pulmonary/Chest: Effort normal.  Coarse breath sounds over RML and RLL  Lymphadenopathy:    She has no cervical adenopathy.  Neurological: She is alert.  Vitals reviewed.    Vitals:   04/04/16 1552  BP: 121/78  Pulse: 75  Resp: 16  Temp: 98 F (36.7 C)       Assessment & Plan:  1. Acute upper respiratory infection Given 2 week duration of illness, smoking status, and no hx of pneumonia vaccine will cover for CAP with Azitthromycin and order CXR Patient declined nebulizer treatment. Proair inhaler ordered Phenergan prn for nausea Instructed to return for pre and post-bronchodilator spirometry  2. Localized primary CMC OA -CMC injection given today -IM Toradol given  today -NSAIDs as needed for pain -XR L hand ordered  I spent 25 minutes with this patient, greater than 50% was face-to-face time counseling regarding the above diagnoses

## 2016-04-04 NOTE — Patient Instructions (Signed)
Upper Respiratory Infection, Adult Most upper respiratory infections (URIs) are a viral infection of the air passages leading to the lungs. A URI affects the nose, throat, and upper air passages. The most common type of URI is nasopharyngitis and is typically referred to as "the common cold." URIs run their course and usually go away on their own. Most of the time, a URI does not require medical attention, but sometimes a bacterial infection in the upper airways can follow a viral infection. This is called a secondary infection. Sinus and middle ear infections are common types of secondary upper respiratory infections. Bacterial pneumonia can also complicate a URI. A URI can worsen asthma and chronic obstructive pulmonary disease (COPD). Sometimes, these complications can require emergency medical care and may be life threatening. What are the causes? Almost all URIs are caused by viruses. A virus is a type of germ and can spread from one person to another. What increases the risk? You may be at risk for a URI if:  You smoke.  You have chronic heart or lung disease.  You have a weakened defense (immune) system.  You are very young or very old.  You have nasal allergies or asthma.  You work in crowded or poorly ventilated areas.  You work in health care facilities or schools.  What are the signs or symptoms? Symptoms typically develop 2-3 days after you come in contact with a cold virus. Most viral URIs last 7-10 days. However, viral URIs from the influenza virus (flu virus) can last 14-18 days and are typically more severe. Symptoms may include:  Runny or stuffy (congested) nose.  Sneezing.  Cough.  Sore throat.  Headache.  Fatigue.  Fever.  Loss of appetite.  Pain in your forehead, behind your eyes, and over your cheekbones (sinus pain).  Muscle aches.  How is this diagnosed? Your health care provider may diagnose a URI by:  Physical exam.  Tests to check that your  symptoms are not due to another condition such as: ? Strep throat. ? Sinusitis. ? Pneumonia. ? Asthma.  How is this treated? A URI goes away on its own with time. It cannot be cured with medicines, but medicines may be prescribed or recommended to relieve symptoms. Medicines may help:  Reduce your fever.  Reduce your cough.  Relieve nasal congestion.  Follow these instructions at home:  Take medicines only as directed by your health care provider.  Gargle warm saltwater or take cough drops to comfort your throat as directed by your health care provider.  Use a warm mist humidifier or inhale steam from a shower to increase air moisture. This may make it easier to breathe.  Drink enough fluid to keep your urine clear or pale yellow.  Eat soups and other clear broths and maintain good nutrition.  Rest as needed.  Return to work when your temperature has returned to normal or as your health care provider advises. You may need to stay home longer to avoid infecting others. You can also use a face mask and careful hand washing to prevent spread of the virus.  Increase the usage of your inhaler if you have asthma.  Do not use any tobacco products, including cigarettes, chewing tobacco, or electronic cigarettes. If you need help quitting, ask your health care provider. How is this prevented? The best way to protect yourself from getting a cold is to practice good hygiene.  Avoid oral or hand contact with people with cold symptoms.  Wash your   hands often if contact occurs.  There is no clear evidence that vitamin C, vitamin E, echinacea, or exercise reduces the chance of developing a cold. However, it is always recommended to get plenty of rest, exercise, and practice good nutrition. Contact a health care provider if:  You are getting worse rather than better.  Your symptoms are not controlled by medicine.  You have chills.  You have worsening shortness of breath.  You have  brown or red mucus.  You have yellow or brown nasal discharge.  You have pain in your face, especially when you bend forward.  You have a fever.  You have swollen neck glands.  You have pain while swallowing.  You have white areas in the back of your throat. Get help right away if:  You have severe or persistent: ? Headache. ? Ear pain. ? Sinus pain. ? Chest pain.  You have chronic lung disease and any of the following: ? Wheezing. ? Prolonged cough. ? Coughing up blood. ? A change in your usual mucus.  You have a stiff neck.  You have changes in your: ? Vision. ? Hearing. ? Thinking. ? Mood. This information is not intended to replace advice given to you by your health care provider. Make sure you discuss any questions you have with your health care provider. Document Released: 10/12/2000 Document Revised: 12/20/2015 Document Reviewed: 07/24/2013 Elsevier Interactive Patient Education  2017 [employer]vier Inc.  

## 2016-04-04 NOTE — Assessment & Plan Note (Signed)
Guided injection as above.  X-rays. Return in one month to recheck.

## 2016-04-04 NOTE — Addendum Note (Signed)
Addended by: Elizabeth Sauer on: 04/04/2016 04:53 PM   Modules accepted: Orders

## 2016-04-05 ENCOUNTER — Ambulatory Visit: Payer: Medicaid Other | Admitting: Psychology

## 2016-04-11 ENCOUNTER — Other Ambulatory Visit: Payer: Self-pay | Admitting: Physician Assistant

## 2016-04-12 ENCOUNTER — Ambulatory Visit: Payer: Medicaid Other | Admitting: Psychology

## 2016-04-13 ENCOUNTER — Encounter: Payer: Self-pay | Admitting: Physician Assistant

## 2016-04-13 ENCOUNTER — Ambulatory Visit (INDEPENDENT_AMBULATORY_CARE_PROVIDER_SITE_OTHER): Payer: Medicaid Other | Admitting: Physician Assistant

## 2016-04-13 VITALS — BP 118/80 | HR 105 | Ht 60.0 in | Wt 139.0 lb

## 2016-04-13 DIAGNOSIS — R002 Palpitations: Secondary | ICD-10-CM

## 2016-04-13 DIAGNOSIS — M5010 Cervical disc disorder with radiculopathy, unspecified cervical region: Secondary | ICD-10-CM | POA: Diagnosis not present

## 2016-04-13 DIAGNOSIS — F331 Major depressive disorder, recurrent, moderate: Secondary | ICD-10-CM

## 2016-04-13 DIAGNOSIS — F411 Generalized anxiety disorder: Secondary | ICD-10-CM | POA: Diagnosis not present

## 2016-04-13 MED ORDER — CLONAZEPAM 1 MG PO TABS: 1.0000 mg | ORAL_TABLET | Freq: Two times a day (BID) | ORAL | 5 refills | Status: DC

## 2016-04-13 MED ORDER — CELECOXIB 400 MG PO CAPS: 400.0000 mg | ORAL_CAPSULE | Freq: Two times a day (BID) | ORAL | 1 refills | Status: DC

## 2016-04-13 MED ORDER — TIZANIDINE HCL 6 MG PO CAPS: 6.0000 mg | ORAL_CAPSULE | Freq: Three times a day (TID) | ORAL | 5 refills | Status: DC

## 2016-04-13 MED ORDER — METOPROLOL SUCCINATE ER 100 MG PO TB24: 100.0000 mg | ORAL_TABLET | Freq: Every day | ORAL | 1 refills | Status: DC

## 2016-04-13 MED ORDER — DULOXETINE HCL 60 MG PO CPEP: 60.0000 mg | ORAL_CAPSULE | Freq: Every day | ORAL | 1 refills | Status: DC

## 2016-04-13 MED ORDER — LAMOTRIGINE 100 MG PO TABS: ORAL_TABLET | ORAL | 1 refills | Status: DC

## 2016-04-13 MED ORDER — PREGABALIN 150 MG PO CAPS: 150.0000 mg | ORAL_CAPSULE | Freq: Two times a day (BID) | ORAL | 1 refills | Status: DC

## 2016-04-13 MED ORDER — ZOLPIDEM TARTRATE 10 MG PO TABS
10.0000 mg | ORAL_TABLET | Freq: Every evening | ORAL | 5 refills | Status: DC | PRN
Start: 2016-04-13 — End: 2016-05-13

## 2016-04-13 NOTE — Progress Notes (Signed)
Subjective:    Patient ID: [name], female    DOB: 03/02/1976, 40 y.o.   MRN: [id number]  HPI   Pt is a 40 yo female who presents to the clinic for medication refills.   .. Active Ambulatory Problems    Diagnosis Date Noted  . Anxiety state 07/06/2007  . Smoker 02/05/2010  . Depressed 06/26/2008  . RHINITIS 10/13/2009  . INTERSTITIAL CYSTITIS 02/05/2010  . Insomnia 07/06/2007  . FATIGUE 07/02/2009  . HEADACHE 07/06/2007  . TROCHANTERIC BURSITIS 06/21/2010  . Tick-borne disease 08/09/2010  . PTSD (post-traumatic stress disorder) 12/14/2012  . Abnormal weight gain 11/29/2013  . Greater trochanteric bursitis 11/29/2013  . Constipation due to opioid therapy 01/17/2014  . Left shoulder pain 04/02/2014  . Carpal tunnel syndrome of left wrist 05/05/2014  . Myofascial pain syndrome 05/29/2014  . Paresthesia 06/03/2014  . Endometriosis 07/20/2014  . Tachycardia 08/13/2014  . S/P cervical spinal fusion 10/28/2014  . Therapeutic opioid induced constipation 12/16/2014  . Vasomotor flushing 06/15/2015  . GAD (generalized anxiety disorder) 06/15/2015  . Fixed pupils 08/31/2015  . Depression 08/31/2015  . Cervical disc disorder with radiculopathy of cervical region 11/11/2015  . Primary osteoarthritis of first carpometacarpal joint of left hand 04/04/2016  . Acute upper respiratory infection 04/04/2016  . Localized primary carpometacarpal osteoarthritis, left 04/04/2016   Resolved Ambulatory Problems    Diagnosis Date Noted  . Polyuria 11/04/2010  . Degenerative disc disease, cervical 10/31/2013  . Cervical disc disorder with radiculopathy of cervical region 11/29/2013  . Itching 11/29/2013  . Radiculitis of left cervical region 01/17/2014  . Nausea with vomiting 11/25/2014  . Dysmenorrhea 05/11/2015  . Dysuria 08/31/2015   Past Medical History:  Diagnosis Date  . Anxiety   . Asthma   . Complication of anesthesia   . Dysrhythmia   . Elevated heart rate  and blood pressure   . Fibromyalgia   . Headache(784.0)   . Hypertension   . IBS (irritable bowel syndrome)   . IC (interstitial cystitis)   . Insomnia   . PONV (postoperative nausea and vomiting)    Pt's main concerned is cervical pain with numbness and radiation into left arm. She has tried numerous muscle relaxers robaxin/flexeril/skelaxin with little relief. xanaflex worked the best but seems to be wearing off. She takes it 3 times a day. She does feel like celebrex, lyrica, cymbalta helps with pain.   Per patient anxiety controlled on current treatment.      Review of Systems  All other systems reviewed and are negative.      Objective:   Physical Exam  Constitutional: She is oriented to person, place, and time. She appears well-developed and well-nourished.  HENT:  Head: Normocephalic and atraumatic.  Cardiovascular: Normal rate, regular rhythm and normal heart sounds.   Pulmonary/Chest: Effort normal and breath sounds normal.  Musculoskeletal:  Limited ROM of neck more to the left than right.  Tenderness over cspine.  Tightness and tenderness to palpation over left upper back to palpation.   Neurological: She is alert and oriented to person, place, and time.  Psychiatric: She has a normal mood and affect. Her behavior is normal.          Assessment & Plan:  Marland KitchenMarland KitchenDiagnoses and all orders for this visit:  Cervical disc disorder with radiculopathy of cervical region -     tizanidine (ZANAFLEX) 6 MG capsule; Take 1 capsule (6 mg total) by mouth 3 (three) times daily. -  celecoxib (CELEBREX) 400 MG capsule; Take 1 capsule (400 mg total) by mouth 2 (two) times daily. -     DULoxetine (CYMBALTA) 60 MG capsule; Take 1 capsule (60 mg total) by mouth daily. -     pregabalin (LYRICA) 150 MG capsule; Take 1 capsule (150 mg total) by mouth 2 (two) times daily.  GAD (generalized anxiety disorder) -     clonazePAM (KLONOPIN) 1 MG tablet; Take 1 tablet (1 mg total) by mouth 2  (two) times daily.  Moderate episode of recurrent major depressive disorder (HCC) -     DULoxetine (CYMBALTA) 60 MG capsule; Take 1 capsule (60 mg total) by mouth daily. -     lamoTRIgine (LAMICTAL) 100 MG tablet; Take one tablet for one week then increase by one tablet every week until get to 4 tablets daily.  Anxiety state -     clonazePAM (KLONOPIN) 1 MG tablet; Take 1 tablet (1 mg total) by mouth 2 (two) times daily.  Palpitations -     metoprolol succinate (TOPROL-XL) 100 MG 24 hr tablet; Take 1 tablet (100 mg total) by mouth daily. Take with or immediately following a meal.  Other orders -     zolpidem (AMBIEN) 10 MG tablet; Take 1 tablet (10 mg total) by mouth at bedtime as needed for sleep.   Medications refilled today.  Increased zanaflex to 6mg  to see if helped a little more.   Follow up in 6 months.

## 2016-04-14 ENCOUNTER — Encounter: Payer: Self-pay | Admitting: Physician Assistant

## 2016-04-14 DIAGNOSIS — R002 Palpitations: Secondary | ICD-10-CM | POA: Insufficient documentation

## 2016-04-18 ENCOUNTER — Ambulatory Visit: Payer: Self-pay | Admitting: Sports Medicine

## 2016-04-28 ENCOUNTER — Other Ambulatory Visit: Payer: Self-pay | Admitting: *Deleted

## 2016-04-28 ENCOUNTER — Other Ambulatory Visit: Payer: Self-pay | Admitting: Physician Assistant

## 2016-04-28 DIAGNOSIS — J069 Acute upper respiratory infection, unspecified: Secondary | ICD-10-CM

## 2016-04-28 MED ORDER — PROMETHAZINE HCL 25 MG PO TABS: 12.5000 mg | ORAL_TABLET | Freq: Four times a day (QID) | ORAL | 0 refills | Status: DC | PRN

## 2016-05-10 ENCOUNTER — Telehealth: Payer: Self-pay

## 2016-05-10 MED ORDER — ONDANSETRON HCL 4 MG PO TABS: 4.0000 mg | ORAL_TABLET | Freq: Three times a day (TID) | ORAL | 0 refills | Status: DC | PRN

## 2016-05-10 NOTE — Telephone Encounter (Signed)
Pt called stating that she called on last week advising that she had the flu and was given phenergan for nausea. Pt states that she felt better for 3 days and the symptoms including nausea returned. She took phenergan for nausea last night and vomited after eating. She would like an alternate nausea medication. Please advise.

## 2016-05-10 NOTE — Telephone Encounter (Signed)
rx sent  Pt notified. 

## 2016-05-10 NOTE — Telephone Encounter (Signed)
Ok to send zofran 4mg  I po q 4-6hrs as needed PRN. #20

## 2016-05-13 ENCOUNTER — Other Ambulatory Visit: Payer: Self-pay | Admitting: Physician Assistant

## 2016-05-13 ENCOUNTER — Other Ambulatory Visit: Payer: Self-pay

## 2016-05-13 MED ORDER — ONDANSETRON HCL 4 MG PO TABS: 4.0000 mg | ORAL_TABLET | Freq: Three times a day (TID) | ORAL | 0 refills | Status: DC | PRN

## 2016-05-23 ENCOUNTER — Other Ambulatory Visit: Payer: Self-pay

## 2016-05-23 DIAGNOSIS — J069 Acute upper respiratory infection, unspecified: Secondary | ICD-10-CM

## 2016-05-23 MED ORDER — PROMETHAZINE HCL 25 MG PO TABS: 12.5000 mg | ORAL_TABLET | Freq: Four times a day (QID) | ORAL | 0 refills | Status: DC | PRN

## 2016-05-24 ENCOUNTER — Ambulatory Visit: Payer: Self-pay | Admitting: Physician Assistant

## 2016-06-01 ENCOUNTER — Ambulatory Visit (INDEPENDENT_AMBULATORY_CARE_PROVIDER_SITE_OTHER): Payer: Medicaid Other | Admitting: Physician Assistant

## 2016-06-01 VITALS — BP 115/80 | HR 75 | Temp 98.4°F | Wt 138.0 lb

## 2016-06-01 DIAGNOSIS — J069 Acute upper respiratory infection, unspecified: Secondary | ICD-10-CM

## 2016-06-01 MED ORDER — BENZONATATE 200 MG PO CAPS: 200.0000 mg | ORAL_CAPSULE | Freq: Three times a day (TID) | ORAL | 0 refills | Status: DC | PRN

## 2016-06-01 MED ORDER — IPRATROPIUM BROMIDE 0.06 % NA SOLN: 1.0000 | Freq: Four times a day (QID) | NASAL | 0 refills | Status: DC | PRN

## 2016-06-01 NOTE — Patient Instructions (Signed)
A FEW NOTES ON OVER-THE-COUNTER COLD MEDICATIONS  USE CAUTION - many OTC medications come in combinations of multiple generic medicines. A clue is anything that has the name "cold" "flu" or "multi-symptom" in the name. ALWAYS READ THE LABELS to make sure you are not taking a cold medicine, which contains medications you are also taking separately or contains medications that you may be allergic to  Remember you can always ask the pharmacist for help in choosing over-the-counter medications  Please continue to take your prescription medications. If you are concerned about a drug interaction or side effect, please call our office  If your symptoms are getting worse despite treatment, please return. For after-hours care, there is someone on-call who can provide advice. For severe illness, go to the emergency room  NASAL SPRAYS: Helps with nasal congestion and ear pressure PRESCRTIPTION ATROVENT - up to 4 times daily as directed on prescription bottle  ANTIHISTAMINES: Helps dry out runny nose and decreases postnasal drip.  ALLEGRA (FEXOFENADINE) - 180 mg daily or 60 mg twice per day  DECONGESTANT: Helps dry out runny nose and helps with sinus pain. May cause insomnia, or sometimes elevated heart rate. Can cause problems if used often in people with high blood pressure.    SUDAFED PE (PHENYLEPHRINE) - 10 mg every 4 - 6 hours, maximum 60 mg per day  COUGH: MUCINEX: scheduled every 4 hours, take with at least 8 ounces of water TESSALON: three times daily as directed on prescription bottle

## 2016-06-01 NOTE — Progress Notes (Signed)
HPI:                                                                [name] is a 41 y.o. female who presents to [facility] [place] today for   Cough  This is a new problem. The current episode started yesterday ("last night"). The problem has been gradually worsening. The cough is productive of sputum. Associated symptoms include chills, a fever (tactile), nasal congestion, postnasal drip, rhinorrhea, a sore throat and sweats. Pertinent negatives include no ear pain, hemoptysis, myalgias, rash, shortness of breath or wheezing. Associated symptoms comments: "itchy ears". The symptoms are aggravated by lying down. Risk factors for lung disease include smoking/tobacco exposure. Treatments tried: Nyquil, Mucinex. Her past medical history is significant for asthma.  Current everyday smoker, 8 cigarettes per day   Past Medical History:  Diagnosis Date  . Anxiety   . Asthma   . Complication of anesthesia   . Dysrhythmia    tachycardia  . Elevated heart rate and blood pressure    takes toprol for increased heart rate  . Fibromyalgia   . Headache(784.0)   . Hypertension    no longer takes meds for HTN  . IBS (irritable bowel syndrome)    questionable, per pt; saw Dr. Collene Mares  . IC (interstitial cystitis)   . Insomnia   . PONV (postoperative nausea and vomiting)    Past Surgical History:  Procedure Laterality Date  . CARPAL TUNNEL RELEASE Left 06/05/2014   Procedure: LEFT CARPAL TUNNEL RELEASE;  Surgeon: Daryll Brod, MD;  Location: [facility];  Service: Orthopedics;  Laterality: Left;  . CARPAL TUNNEL RELEASE Right 08/25/2014   Procedure: RIGHT CARPAL TUNNEL RELEASE;  Surgeon: Daryll Brod, MD;  Location: [facility];  Service: Orthopedics;  Laterality: Right;  . CARPAL TUNNEL RELEASE Left 07/14/2015   Procedure: LEFT CARPAL TUNNEL RELEASE;  Surgeon: Daryll Brod, MD;  Location: [facility] [place];   Service: Orthopedics;  Laterality: Left;  . CERVICAL SPINE SURGERY  8/15  . CESAREAN SECTION    . DILATION AND EVACUATION N/A 05/07/2013   Procedure: DILATATION AND EVACUATION with tissue sent for chromosome analysis;  Surgeon: Lovenia Kim, MD;  Location: [facility];  Service: Gynecology;  Laterality: N/A;  . EPIDURAL STEROID INJECTION  03/20/15  . OVARIAN CYST REMOVAL    . PELVIC LAPAROSCOPY  2003   endometriosis  . POSTERIOR CERVICAL FUSION/FORAMINOTOMY N/A 10/28/2014   Procedure: Cervical Four-Cervical Seven Posterior cervical fusion with lateral mass fixation;  Surgeon: Eustace Moore, MD;  Location: [facility];  Service: Neurosurgery;  Laterality: N/A;  posterior  . ROBOTIC ASSISTED LAPAROSCOPIC LYSIS OF ADHESION N/A 07/18/2014   Procedure: ROBOTIC ASSISTED LAPAROSCOPIC  EXCISION POSTERIOR  UTERINE WALL MASS; EXCISION RIGHT MESSALEINGEAL MASS, EXCISION AND ABLATION CULDASAC ENDOMETRIOSIS;  Surgeon: Brien Few, MD;  Location: [facility];  Service: Gynecology;  Laterality: N/A;  . ROBOTIC ASSISTED TOTAL HYSTERECTOMY WITH SALPINGECTOMY Bilateral 05/11/2015   Procedure: ROBOTIC ASSISTED TOTAL HYSTERECTOMY WITH BILATERAL SALPINGECTOMY;  Surgeon: Brien Few, MD;  Location: [facility];  Service: Gynecology;  Laterality: Bilateral;   Social History  Substance Use Topics  . Smoking status: Current Some Day Smoker    Packs/day: 0.50  Years: 18.00    Types: Cigarettes  . Smokeless tobacco: Never Used  . Alcohol use No   family history includes ADD / ADHD in her brother; Depression in her brother and cousin; Heart attack in her father.  ROS: negative except as noted in the HPI  Medications: Current Outpatient Prescriptions  Medication Sig Dispense Refill  . albuterol (PROVENTIL HFA;VENTOLIN HFA) 108 (90 Base) MCG/ACT inhaler Inhale 1 puff into the lungs every 6 (six) hours as needed for wheezing. 1 Inhaler 0  . busPIRone (BUSPAR) 10 MG tablet TAKE 1 TABLET (10 MG TOTAL) BY MOUTH 3 (THREE)  TIMES DAILY. 90 tablet 2  . celecoxib (CELEBREX) 400 MG capsule Take 1 capsule (400 mg total) by mouth 2 (two) times daily. 180 capsule 1  . clonazePAM (KLONOPIN) 1 MG tablet Take 1 tablet (1 mg total) by mouth 2 (two) times daily. 60 tablet 5  . DULoxetine (CYMBALTA) 60 MG capsule Take 1 capsule (60 mg total) by mouth daily. 90 capsule 1  . lamoTRIgine (LAMICTAL) 100 MG tablet Take one tablet for one week then increase by one tablet every week until get to 4 tablets daily. 120 tablet 1  . metoprolol succinate (TOPROL-XL) 100 MG 24 hr tablet Take 1 tablet (100 mg total) by mouth daily. Take with or immediately following a meal. 90 tablet 1  . ondansetron (ZOFRAN) 4 MG tablet Take 1 tablet (4 mg total) by mouth every 8 (eight) hours as needed for nausea or vomiting. 20 tablet 0  . pregabalin (LYRICA) 150 MG capsule Take 1 capsule (150 mg total) by mouth 2 (two) times daily. 180 capsule 1  . promethazine (PHENERGAN) 25 MG tablet Take 0.5-1 tablets (12.5-25 mg total) by mouth every 6 (six) hours as needed for nausea. 30 tablet 0  . tizanidine (ZANAFLEX) 6 MG capsule Take 1 capsule (6 mg total) by mouth 3 (three) times daily. 90 capsule 5  . zolpidem (AMBIEN) 10 MG tablet TAKE 1 TABLET BY MOUTH AT BEDTIME AS NEEDED FOR SLEEP 30 tablet 0   No current facility-administered medications for this visit.    Allergies  Allergen Reactions  . Prednisone Other (See Comments)    Pain all over, "it triggers my myofascial pain syndrome"   . Gadolinium Derivatives Hives, Itching and Other (See Comments)    After MRI with Multihance finished, patient had redness and itching on chest, tightness in throat.  Patient went to ED to be monitored (04/23/14).  . Amitriptyline Other (See Comments)    Gained weight and does not want to be on.   . Eggs Or Egg-Derived Products Rash  . Hydrocodone-Acetaminophen Itching    Tolerates with benadryl (no reaction to oxycodone)  . Lunesta [Eszopiclone] Other (See Comments)     Metallic taste in mouth  . Tramadol Itching       Objective:  BP 115/80   Pulse 75   Temp 98.4 F (36.9 C) (Oral)   Wt 138 lb (62.6 kg)   LMP 05/10/2015   SpO2 98%   BMI 26.95 kg/m  Gen: well-groomed, cooperative, not ill-appearing, no distress HEENT: normal conjunctiva, TM's clear, nasal mucosa edematous, oropharynx clear, moist mucus membranes, no frontal or maxillary sinus tenderness Pulm: Normal work of breathing, normal phonation, breath sounds diminished on expiration, clear to auscultation bilaterally, no wheezes, rales or rhonchi CV: Normal rate, regular rhythm, s1 and s2 distinct, no murmurs, clicks or rubs  GI: soft, nondistended, nontender Neuro: alert and oriented x 3, EOM's intact Lymph: no  cervical or tonsillar adenopathy Skin: warm and dry, no rashes or lesions on exposed skin, no cyanosis  CLINICAL DATA:  Fever, cough and body aches for 2 weeks  EXAM: CHEST  2 VIEW  COMPARISON:  Sep 19, 2014  FINDINGS: The heart size and mediastinal contours are within normal limits. There is no focal infiltrate, pulmonary edema, or pleural effusion. Patient status post prior fixation of the cervical spine. The visualized skeletal structures are otherwise unremarkable.  IMPRESSION: No active cardiopulmonary disease.   Electronically Signed   By: Abelardo Diesel M.D.   On: 04/05/2016 07:11     Assessment and Plan: 41 y.o. female with  1. Acute upper respiratory infection - symptoms have been present for less than 24 hours. symptomatic management with oral decongestant, nasal spray, and tessalon - no signs of acute asthma/COPD exacerbation. Reviewed most recent CXR from 04/04/16 which showed no active cardiopulmonary disease.  - encouraged smoking cessation and provided information for the [place] Quit Line - ipratropium (ATROVENT) 0.06 % nasal spray; Place 1 spray into both nostrils 4 (four) times daily as needed.  Dispense: 15 mL; Refill: 0 - benzonatate  (TESSALON) 200 MG capsule; Take 1 capsule (200 mg total) by mouth 3 (three) times daily as needed for cough.  Dispense: 45 capsule; Refill: 0    Patient education and anticipatory guidance given Patient agrees with treatment plan Follow-up as needed if symptoms worsen or fail to improve  Darlyne Russian PA-C

## 2016-06-02 ENCOUNTER — Encounter: Payer: Self-pay | Admitting: Physician Assistant

## 2016-06-13 ENCOUNTER — Telehealth: Payer: Self-pay

## 2016-06-13 NOTE — Telephone Encounter (Signed)
Patient advised.

## 2016-06-13 NOTE — Telephone Encounter (Signed)
It needs to be early March

## 2016-06-13 NOTE — Telephone Encounter (Signed)
Donesia called and wanted to know if it is too early for a steroid injection for her left hand. Please advise.

## 2016-06-14 ENCOUNTER — Ambulatory Visit: Payer: Self-pay | Admitting: Physician Assistant

## 2016-06-16 ENCOUNTER — Other Ambulatory Visit: Payer: Self-pay | Admitting: Neurological Surgery

## 2016-06-16 DIAGNOSIS — M542 Cervicalgia: Secondary | ICD-10-CM

## 2016-06-20 DIAGNOSIS — R0602 Shortness of breath: Secondary | ICD-10-CM | POA: Diagnosis not present

## 2016-06-24 ENCOUNTER — Telehealth: Payer: Self-pay

## 2016-06-24 ENCOUNTER — Telehealth: Payer: Self-pay | Admitting: Physician Assistant

## 2016-06-24 ENCOUNTER — Other Ambulatory Visit: Payer: Self-pay | Admitting: *Deleted

## 2016-06-24 DIAGNOSIS — J209 Acute bronchitis, unspecified: Secondary | ICD-10-CM

## 2016-06-24 MED ORDER — HYDROCOD POLST-CPM POLST ER 10-8 MG/5ML PO SUER: 5.0000 mL | Freq: Two times a day (BID) | ORAL | 0 refills | Status: DC | PRN

## 2016-06-24 NOTE — Telephone Encounter (Signed)
Pt has picked up Rx

## 2016-06-24 NOTE — Telephone Encounter (Signed)
Pt called stating that she was seen at the ED for the flu and they gave her Tussionex for her cough. Pt states she ran out of Tussionex and wants to know if she can get a prescription for more. Pt states she is coughing so much that it is causing her to leak urine Pt states that Tussionex was helping her.

## 2016-06-24 NOTE — Telephone Encounter (Signed)
We looked her up in the [place] says his registry. There was absolutely nothing there.  So we called her pharmacy to verify prescriptions that have been recently filled including her clonazepam, Ambien and Lyrica. She did have Tussionex filled on February 19, approximately 4 days ago. We will go ahead and refill the medication for twice a day dosing only and if she is not better by that point and she needs an office visit with her PCP.

## 2016-06-24 NOTE — Telephone Encounter (Signed)
Pt came in to pick up prescription and stated that she wanted to let you know she has started throwing up again and coughing a lot . Thanks

## 2016-06-24 NOTE — Telephone Encounter (Signed)
Please look her up on the [facility] and print her last 6 months to see if we can prescribe the medication since has a narcotic in it.    Beatrice Lecher, MD

## 2016-06-27 ENCOUNTER — Other Ambulatory Visit: Payer: Self-pay | Admitting: Physician Assistant

## 2016-06-27 DIAGNOSIS — J069 Acute upper respiratory infection, unspecified: Secondary | ICD-10-CM

## 2016-06-27 NOTE — Telephone Encounter (Signed)
Call pt: has she had CXR?

## 2016-06-28 ENCOUNTER — Ambulatory Visit: Payer: Self-pay | Admitting: Physician Assistant

## 2016-06-29 ENCOUNTER — Encounter: Payer: Self-pay | Admitting: Physician Assistant

## 2016-06-29 ENCOUNTER — Ambulatory Visit (INDEPENDENT_AMBULATORY_CARE_PROVIDER_SITE_OTHER): Payer: Medicaid Other | Admitting: Physician Assistant

## 2016-06-29 VITALS — BP 109/72 | HR 76 | Ht 60.0 in | Wt 135.0 lb

## 2016-06-29 DIAGNOSIS — R059 Cough, unspecified: Secondary | ICD-10-CM

## 2016-06-29 DIAGNOSIS — F17200 Nicotine dependence, unspecified, uncomplicated: Secondary | ICD-10-CM

## 2016-06-29 DIAGNOSIS — M5010 Cervical disc disorder with radiculopathy, unspecified cervical region: Secondary | ICD-10-CM | POA: Diagnosis not present

## 2016-06-29 DIAGNOSIS — R413 Other amnesia: Secondary | ICD-10-CM | POA: Diagnosis not present

## 2016-06-29 DIAGNOSIS — F339 Major depressive disorder, recurrent, unspecified: Secondary | ICD-10-CM | POA: Diagnosis not present

## 2016-06-29 DIAGNOSIS — R05 Cough: Secondary | ICD-10-CM

## 2016-06-29 LAB — CBC
HEMATOCRIT: 43.8 % (ref 35.0–45.0)
HEMOGLOBIN: 14.3 g/dL (ref 11.7–15.5)
MCH: 32.1 pg (ref 27.0–33.0)
MCHC: 32.6 g/dL (ref 32.0–36.0)
MCV: 98.2 fL (ref 80.0–100.0)
MPV: 9.5 fL (ref 7.5–12.5)
Platelets: 416 10*3/uL — ABNORMAL HIGH (ref 140–400)
RBC: 4.46 MIL/uL (ref 3.80–5.10)
RDW: 12.2 % (ref 11.0–15.0)
WBC: 12.9 10*3/uL — AB (ref 3.8–10.8)

## 2016-06-29 MED ORDER — BUDESONIDE-FORMOTEROL FUMARATE 160-4.5 MCG/ACT IN AERO
2.0000 | INHALATION_SPRAY | Freq: Two times a day (BID) | RESPIRATORY_TRACT | 0 refills | Status: DC
Start: 2016-06-29 — End: 2016-08-06

## 2016-06-29 MED ORDER — KETOROLAC TROMETHAMINE 10 MG PO TABS: 10.0000 mg | ORAL_TABLET | Freq: Four times a day (QID) | ORAL | 1 refills | Status: DC | PRN

## 2016-06-29 MED ORDER — LAMOTRIGINE 100 MG PO TABS: 100.0000 mg | ORAL_TABLET | Freq: Every day | ORAL | 5 refills | Status: DC

## 2016-06-29 MED ORDER — KETOROLAC TROMETHAMINE 60 MG/2ML IM SOLN
60.0000 mg | Freq: Once | INTRAMUSCULAR | Status: AC
  Administered 2016-06-29: 60 mg via INTRAMUSCULAR

## 2016-06-29 MED ORDER — METHYLPREDNISOLONE SODIUM SUCC 125 MG IJ SOLR
125.0000 mg | Freq: Once | INTRAMUSCULAR | Status: AC
  Administered 2016-06-29: 125 mg via INTRAMUSCULAR

## 2016-06-29 NOTE — Patient Instructions (Signed)
Start symbicort 2 puffs twice a day.  Follow up in 1 month for COPd is cough persist.  Stop smoking.

## 2016-06-29 NOTE — Progress Notes (Addendum)
Subjective:     Patient ID: [name], female   DOB: Sep 26, 1975, 41 y.o.   MRN: [id number]  HPI  This is a 41 year old female who presents for a non-productive cough x 1 month. Cough is worst at night and causes her to gag. She has taken Hycodon which provided temporary relief and tessalon pearls which did not work at all. Smokes 1/2 ppd but has decreased since being sick. CXR on 2/19 normal. She states she had the flu on the 18th and was seen by the emergency department. Uses albuterol twice a day and once at night time. Cannot use oral prednisone because this will flare up her chronic pain syndrome.  Also complains of neck pain. History of cervical spinal fusion. Requests refill on Toradol. States she occasionally takes her Celebrex.  Relays decrease in short term memory x 2 months. Says she frequently will repeat the same questions and finds herself unable to remember what she did the day before. Denies any recent change in medication aside from increasing Lamictal to 100 mg in December.   Review of Systems All other ROS negative except those noted in the HPI.     Objective:   Physical Exam  Constitutional: She is oriented to person, place, and time. She appears well-developed and well-nourished. No distress.  HENT:  Head: Normocephalic and atraumatic.  Right Ear: External ear normal.  Left Ear: External ear normal.  Mouth/Throat: Oropharynx is clear and moist.  Eyes: Conjunctivae are normal. Right eye exhibits no discharge. Left eye exhibits no discharge.  Neck: Normal range of motion. Neck supple. No thyromegaly present.  Tenderness over paraspinal muscles.  Cardiovascular: Normal rate, regular rhythm and normal heart sounds.   Pulmonary/Chest: Effort normal. She has no wheezes. She has no rales.  Rhonchi auscultated over left upper lobe.  Abdominal: Soft. Bowel sounds are normal.  Musculoskeletal: Normal range of motion.  Lymphadenopathy:    She has no cervical  adenopathy.  Neurological: She is alert and oriented to person, place, and time.  Skin: Skin is warm and dry.  Psychiatric: She has a normal mood and affect. Her behavior is normal.   Blood pressure 109/72, pulse 76, height 5' (1.524 m), weight 135 lb (61.2 kg), last menstrual period 05/10/2015, SpO2 100 %.  6CIT: score of 6 today placing her in normal range for cognitive function.      Assessment/Plan:  Diagnoses and all orders for this visit:  Cough -     budesonide-formoterol (SYMBICORT) 160-4.5 MCG/ACT inhaler; Inhale 2 puffs into the lungs 2 (two) times daily. -     methylPREDNISolone sodium succinate (SOLU-MEDROL) 125 mg/2 mL injection 125 mg; Inject 2 mLs (125 mg total) into the muscle once.  Cervical disc disorder with radiculopathy of cervical region -     ketorolac (TORADOL) 10 MG tablet; Take 1 tablet (10 mg total) by mouth every 6 (six) hours as needed. -     ketorolac (TORADOL) injection 60 mg; Inject 2 mLs (60 mg total) into the muscle once.  Memory changes -     RPR -     Vitamin B12 -     Sedimentation rate -     CBC -     TSH -     Folate -     HIV antibody (with reflex)  Recurrent major depressive disorder, remission status unspecified (HCC) -     lamoTRIgine (LAMICTAL) 100 MG tablet; Take 1 tablet (100 mg total) by mouth daily.  Smoker  -  Given smoking history as well as chronicity of cough and several episodes through out the past 6 months, patient is most likely developing some chronic bronchitis. CXR was negative on 2/19 so unlikely pneumonia. Patient given IM Solumedrol today for airway inflammation given intolerance to oral prednisone. Also prescribed inhaled corticosteroids which she is instructed to use daily in addition to the albuterol. Told patient to rinse her month after use to avoid thrush. Consider spirometry if cough not resolving in one month.  - Patient given IM toradol and PO toradol refilled for cervical radiculopathy. Discontinued Celebrex  use. - 6CIT score of 6 today indicating normal cognitive function. It is possible this is due to increased Lamictal however this is unlikely. Ordered several labs to assess for cause of memory loss and will follow up with results.  - Lamictal refilled.

## 2016-06-30 LAB — RPR

## 2016-06-30 LAB — TSH: TSH: 2.22 m[IU]/L

## 2016-06-30 LAB — FOLATE: FOLATE: 14 ng/mL (ref >5.4)

## 2016-06-30 LAB — VITAMIN B12: Vitamin B-12: 340 pg/mL (ref 200–1100)

## 2016-06-30 LAB — SEDIMENTATION RATE: SED RATE: 4 mm/h (ref 0–20)

## 2016-06-30 LAB — HIV ANTIBODY (ROUTINE TESTING W REFLEX): HIV 1&2 Ab, 4th Generation: NONREACTIVE

## 2016-07-01 ENCOUNTER — Ambulatory Visit
Admission: RE | Admit: 2016-07-01 | Discharge: 2016-07-01 | Disposition: A | Discharge status: Home / Self Care | Payer: Medicaid Other | Source: Ambulatory Visit | Attending: Neurological Surgery | Admitting: Neurological Surgery

## 2016-07-01 DIAGNOSIS — M542 Cervicalgia: Secondary | ICD-10-CM

## 2016-07-01 MED ORDER — TRIAMCINOLONE ACETONIDE 40 MG/ML IJ SUSP (RADIOLOGY)
60.0000 mg | Freq: Once | INTRAMUSCULAR | Status: AC
  Administered 2016-07-01: 60 mg via EPIDURAL

## 2016-07-01 MED ORDER — IOPAMIDOL (ISOVUE-M 300) INJECTION 61%
1.0000 mL | Freq: Once | INTRAMUSCULAR | Status: AC | PRN
  Administered 2016-07-01: 1 mL via EPIDURAL

## 2016-07-08 ENCOUNTER — Telehealth: Payer: Self-pay | Admitting: *Deleted

## 2016-07-08 NOTE — Telephone Encounter (Signed)
Submitted a PA  Through [facility] over the phone. PT (ID # is [id number])  Approval # is [phone]. Call ref ID # is [id number]  Patient and pharm notified

## 2016-07-18 ENCOUNTER — Ambulatory Visit (INDEPENDENT_AMBULATORY_CARE_PROVIDER_SITE_OTHER): Payer: Medicaid Other | Admitting: Physician Assistant

## 2016-07-18 ENCOUNTER — Encounter: Payer: Self-pay | Admitting: Physician Assistant

## 2016-07-18 VITALS — BP 109/70 | HR 80 | Ht 60.0 in | Wt 136.0 lb

## 2016-07-18 DIAGNOSIS — R2689 Other abnormalities of gait and mobility: Secondary | ICD-10-CM | POA: Diagnosis not present

## 2016-07-18 DIAGNOSIS — R42 Dizziness and giddiness: Secondary | ICD-10-CM

## 2016-07-18 DIAGNOSIS — F5101 Primary insomnia: Secondary | ICD-10-CM | POA: Diagnosis not present

## 2016-07-18 MED ORDER — ZOLPIDEM TARTRATE 10 MG PO TABS: 10.0000 mg | ORAL_TABLET | Freq: Every evening | ORAL | 5 refills | Status: DC | PRN

## 2016-07-18 NOTE — Patient Instructions (Addendum)
Cut metoprolol in half. Recheck in one month.  Stay hydrated.  Get dizzy diary.

## 2016-07-18 NOTE — Progress Notes (Signed)
   Subjective:    Patient ID: [name], female    DOB: 24-May-1975, 41 y.o.   MRN: [id number]  HPI  Pt is a 41 yo female who presents to the clinic with ongoing dizziness for the last few months. Yesterday she was in bath tub and got dizzy when she stood to get out. At times when she stands she gets dizzy. She was in the kitchen and lost her balance and hit her nose on the stove and then fell backwards. She often just feels "off balance". She missed a few steps last week and fell on her knees.   Denies alcohol use. Denies any changes to medication.     Review of Systems  All other systems reviewed and are negative.      Objective:   Physical Exam  Constitutional: She is oriented to person, place, and time. She appears well-developed and well-nourished.  HENT:  Head: Normocephalic and atraumatic.  Right Ear: External ear normal.  Left Ear: External ear normal.  Nose: Nose normal.  Mouth/Throat: Oropharynx is clear and moist. No oropharyngeal exudate.  Eyes: Conjunctivae and EOM are normal. Pupils are equal, round, and reactive to light. Right eye exhibits no discharge. Left eye exhibits no discharge.  Neck: Normal range of motion. Neck supple.  Cardiovascular: Normal rate, regular rhythm and normal heart sounds.   Pulmonary/Chest: Effort normal and breath sounds normal.  Lymphadenopathy:    She has no cervical adenopathy.  Neurological: She is alert and oriented to person, place, and time. She has normal reflexes. No cranial nerve deficit. Coordination normal.  Psychiatric: She has a normal mood and affect. Her behavior is normal.          Assessment & Plan:  Marland KitchenMarland KitchenDiagnoses and all orders for this visit:  Dizziness  Balance problems  Primary insomnia -     zolpidem (AMBIEN) 10 MG tablet; Take 1 tablet (10 mg total) by mouth at bedtime as needed. for sleep   Unclear etiology. Neuro exam intact.  I do think there could be some drop in BP when standing and  with BP low cause some dizziness. Orthostatic BP within normal range today. Discussed to be carefull with getting up fast. Make sure to stay hydrated. When feeling weak eat something salty.  Cut metoprolol in half since BP staying so low.  Pt is also on quite a few medications that can cause dizziness. Discussed medications and how she can start limiting them.  Keep dizzy diary and lets try to look for triggers. Follow up in 4 weeks. Discussed PT for balance evaluation. Pt declined today.

## 2016-07-19 DIAGNOSIS — R2689 Other abnormalities of gait and mobility: Secondary | ICD-10-CM | POA: Insufficient documentation

## 2016-07-19 DIAGNOSIS — R42 Dizziness and giddiness: Secondary | ICD-10-CM | POA: Insufficient documentation

## 2016-08-01 ENCOUNTER — Other Ambulatory Visit: Payer: Self-pay | Admitting: Physician Assistant

## 2016-08-01 DIAGNOSIS — J069 Acute upper respiratory infection, unspecified: Secondary | ICD-10-CM

## 2016-08-06 ENCOUNTER — Other Ambulatory Visit: Payer: Self-pay | Admitting: Physician Assistant

## 2016-08-06 DIAGNOSIS — R059 Cough, unspecified: Secondary | ICD-10-CM

## 2016-08-06 DIAGNOSIS — R05 Cough: Secondary | ICD-10-CM

## 2016-08-16 ENCOUNTER — Other Ambulatory Visit: Payer: Self-pay | Admitting: Physician Assistant

## 2016-08-16 DIAGNOSIS — J069 Acute upper respiratory infection, unspecified: Secondary | ICD-10-CM

## 2016-08-19 ENCOUNTER — Telehealth: Payer: Self-pay

## 2016-08-19 NOTE — Telephone Encounter (Signed)
Ok with plan

## 2016-08-19 NOTE — Telephone Encounter (Signed)
[name] called and left a message stating she needs something "her vomit". I refilled her Zofran.

## 2016-08-22 ENCOUNTER — Ambulatory Visit: Payer: Medicaid Other | Admitting: Physician Assistant

## 2016-08-23 ENCOUNTER — Ambulatory Visit: Payer: Medicaid Other | Admitting: Physician Assistant

## 2016-08-29 ENCOUNTER — Other Ambulatory Visit: Payer: Self-pay | Admitting: Physician Assistant

## 2016-08-29 DIAGNOSIS — R002 Palpitations: Secondary | ICD-10-CM

## 2016-09-05 ENCOUNTER — Ambulatory Visit (INDEPENDENT_AMBULATORY_CARE_PROVIDER_SITE_OTHER): Payer: Medicaid Other

## 2016-09-05 ENCOUNTER — Encounter: Payer: Self-pay | Admitting: Physician Assistant

## 2016-09-05 ENCOUNTER — Ambulatory Visit (INDEPENDENT_AMBULATORY_CARE_PROVIDER_SITE_OTHER): Payer: Medicaid Other | Admitting: Sports Medicine

## 2016-09-05 ENCOUNTER — Ambulatory Visit (INDEPENDENT_AMBULATORY_CARE_PROVIDER_SITE_OTHER): Payer: Medicaid Other | Admitting: Physician Assistant

## 2016-09-05 ENCOUNTER — Encounter: Payer: Self-pay | Admitting: Sports Medicine

## 2016-09-05 VITALS — BP 112/75 | HR 92 | Ht 60.0 in | Wt 139.0 lb

## 2016-09-05 DIAGNOSIS — M5416 Radiculopathy, lumbar region: Secondary | ICD-10-CM | POA: Diagnosis not present

## 2016-09-05 DIAGNOSIS — W19XXXA Unspecified fall, initial encounter: Secondary | ICD-10-CM

## 2016-09-05 DIAGNOSIS — M7051 Other bursitis of knee, right knee: Secondary | ICD-10-CM | POA: Diagnosis not present

## 2016-09-05 DIAGNOSIS — M1812 Unilateral primary osteoarthritis of first carpometacarpal joint, left hand: Secondary | ICD-10-CM

## 2016-09-05 DIAGNOSIS — M25461 Effusion, right knee: Secondary | ICD-10-CM | POA: Diagnosis not present

## 2016-09-05 DIAGNOSIS — M25561 Pain in right knee: Secondary | ICD-10-CM

## 2016-09-05 DIAGNOSIS — R200 Anesthesia of skin: Secondary | ICD-10-CM

## 2016-09-05 DIAGNOSIS — M5010 Cervical disc disorder with radiculopathy, unspecified cervical region: Secondary | ICD-10-CM

## 2016-09-05 DIAGNOSIS — R202 Paresthesia of skin: Secondary | ICD-10-CM | POA: Diagnosis not present

## 2016-09-05 DIAGNOSIS — F331 Major depressive disorder, recurrent, moderate: Secondary | ICD-10-CM | POA: Diagnosis not present

## 2016-09-05 DIAGNOSIS — M5440 Lumbago with sciatica, unspecified side: Secondary | ICD-10-CM

## 2016-09-05 DIAGNOSIS — M1711 Unilateral primary osteoarthritis, right knee: Secondary | ICD-10-CM | POA: Insufficient documentation

## 2016-09-05 MED ORDER — TIZANIDINE HCL 6 MG PO CAPS: 6.0000 mg | ORAL_CAPSULE | Freq: Three times a day (TID) | ORAL | 5 refills | Status: DC

## 2016-09-05 MED ORDER — BUSPIRONE HCL 10 MG PO TABS: 10.0000 mg | ORAL_TABLET | Freq: Three times a day (TID) | ORAL | 1 refills | Status: DC

## 2016-09-05 MED ORDER — PREGABALIN 150 MG PO CAPS: 150.0000 mg | ORAL_CAPSULE | Freq: Two times a day (BID) | ORAL | 1 refills | Status: DC

## 2016-09-05 MED ORDER — KETOROLAC TROMETHAMINE 10 MG PO TABS: 10.0000 mg | ORAL_TABLET | Freq: Four times a day (QID) | ORAL | 1 refills | Status: DC | PRN

## 2016-09-05 MED ORDER — DULOXETINE HCL 60 MG PO CPEP: 60.0000 mg | ORAL_CAPSULE | Freq: Every day | ORAL | 1 refills | Status: DC

## 2016-09-05 NOTE — Progress Notes (Signed)
Call pt: OA changes in medial knee but no acute changes. Continue with conservative treatment plan ice, compression, rest, elevation, NSAIDs

## 2016-09-05 NOTE — Progress Notes (Signed)
Subjective:    Patient ID: [name], female    DOB: 1975/12/24, 41 y.o.   MRN: [id number]  HPI  Pt is a 41 yo female who presents to the clinic to for fall evaluation. She was last seen for dizziness on 07/18/16 and metoprolol was cut in half due to low BP. She has not had any more dizziness but she has fallen 4 times. She feels like her legs "just don't work". The most recent fall was 2 nights ago down the stairs. She missed the last 2 stairs and landed on her knees. More right than left. Her right knee is very tender to touch. She is still able to walk. 2 weeks ago she stated "her legs started marching uncontrollably". They stopped after a few minutes. Her low back does hurt with numbness and tingling that radiates down complete left leg and into feet. She continues to have problems with numbness and tingling into left hand. She denies any bowel or bladder dysfunction.   Pt also request medication refills. She is doing ok on all medications currently.   Review of Systems  All other systems reviewed and are negative.      Objective:   Physical Exam  Constitutional: She is oriented to person, place, and time. She appears well-developed and well-nourished.  HENT:  Head: Normocephalic and atraumatic.  Cardiovascular: Normal rate, regular rhythm and normal heart sounds.   Pulmonary/Chest: Effort normal and breath sounds normal.  Musculoskeletal:  Left leg- continuous numbness and tingling.  NROM.  Strength on left foot shaky and decreased 3/5.   Right knee-superficial abrasion. No drainage, erythema, some minimal swelling noted.  NROM. Tenderness to palpation over medical knee joint anterior to joint space over medial tibial tuberosity.   Neurological: She is alert and oriented to person, place, and time.  Psychiatric: She has a normal mood and affect. Her behavior is normal.          Assessment & Plan:  Marland KitchenMarland KitchenDiagnoses and all orders for this visit:  Numbness and  tingling of left leg -     DG Lumbar Spine Complete; Future  Fall, initial encounter -     DG Lumbar Spine Complete; Future -     DG Knee Complete 4 Views Right; Future -     DG Knee 1-2 Views Left; Future  Acute pain of right knee -     DG Knee Complete 4 Views Right; Future  Cervical disc disorder with radiculopathy of cervical region -     DULoxetine (CYMBALTA) 60 MG capsule; Take 1 capsule (60 mg total) by mouth daily. -     ketorolac (TORADOL) 10 MG tablet; Take 1 tablet (10 mg total) by mouth every 6 (six) hours as needed. -     pregabalin (LYRICA) 150 MG capsule; Take 1 capsule (150 mg total) by mouth 2 (two) times daily. -     tizanidine (ZANAFLEX) 6 MG capsule; Take 1 capsule (6 mg total) by mouth 3 (three) times daily.  Moderate episode of recurrent major depressive disorder (HCC) -     DULoxetine (CYMBALTA) 60 MG capsule; Take 1 capsule (60 mg total) by mouth daily.  Low back pain with sciatica, sciatica laterality unspecified, unspecified back pain laterality, unspecified chronicity  Other orders -     busPIRone (BUSPAR) 10 MG tablet; Take 1 tablet (10 mg total) by mouth 3 (three) times daily.   Will get imaging to evaluate.  Right knee-mild OA no acute changes.  Lumbar xray-  no appreciable arthropathic changes.   Concerned with left leg numbness. Pt has appt with Dr. Darene Lamer and encouraged patient to have him evaluate.  Discussed rest, ice, elevate right leg, NSAIDs as needed.  Strongly encouraged follow up with orthopedist that is following CDD and left radiculopathy.

## 2016-09-05 NOTE — Assessment & Plan Note (Signed)
Previous injection was 5 months ago, repeat first carpometacarpal joint injection as above.

## 2016-09-05 NOTE — Assessment & Plan Note (Signed)
Rehabilitation exercises given, return in one month, injection if no better.

## 2016-09-05 NOTE — Progress Notes (Signed)
Call pt: no appreciable arthropathic changes.

## 2016-09-05 NOTE — Progress Notes (Signed)
   Subjective:    I'm seeing this patient as a consultation for:  Iran Planas, PA-C  CC: Right knee pain  HPI: [name] fell not too long ago, she has pain that she localizes on the anteromedial aspect of her right knee just distal to the joint line. Moderate, persistent without radiation.  Left first CMC arthritis: Previous injection was in year ago, desires repeat injection today, pain is moderate, persistent, localized without radiation.  Past medical history:  Negative.  See flowsheet/record as well for more information.  Surgical history: Negative.  See flowsheet/record as well for more information.  Family history: Negative.  See flowsheet/record as well for more information.  Social history: Negative.  See flowsheet/record as well for more information.  Allergies, and medications have been entered into the medical record, reviewed, and no changes needed.   Review of Systems: No headache, visual changes, nausea, vomiting, diarrhea, constipation, dizziness, abdominal pain, skin rash, fevers, chills, night sweats, weight loss, swollen lymph nodes, body aches, joint swelling, muscle aches, chest pain, shortness of breath, mood changes, visual or auditory hallucinations.   Objective:   General: Well Developed, well nourished, and in no acute distress.  Neuro/Psych: Alert and oriented x3, extra-ocular muscles intact, able to move all 4 extremities, sensation grossly intact. Skin: Warm and dry, no rashes noted.  Respiratory: Not using accessory muscles, speaking in full sentences, trachea midline.  Cardiovascular: Pulses palpable, no extremity edema. Abdomen: Does not appear distended. Right Knee: Normal to inspection with no erythema or effusion or obvious bony abnormalities. Tender to palpation over the pes anserine bursa. ROM normal in flexion and extension and lower leg rotation. Ligaments with solid consistent endpoints including ACL, PCL, LCL, MCL. Negative Mcmurray's and  provocative meniscal tests. Non painful patellar compression. Patellar and quadriceps tendons unremarkable. Hamstring and quadriceps strength is normal.  Procedure: Real-time Ultrasound Guided Injection of left first carpometacarpal joint Device: GE Logiq E  Verbal informed consent obtained.  Time-out conducted.  Noted no overlying erythema, induration, or other signs of local infection.  Skin prepped in a sterile fashion.  Local anesthesia: Topical Ethyl chloride.  With sterile technique and under real time ultrasound guidance:  25-gauge needle advanced into the joint and 1/2 mL kenalog 40, 1/2 mL lidocaine injected easily. Completed without difficulty  Pain immediately resolved suggesting accurate placement of the medication.  Advised to call if fevers/chills, erythema, induration, drainage, or persistent bleeding.  Images permanently stored and available for review in the ultrasound unit.  Impression: Technically successful ultrasound guided injection.  Impression and Recommendations:   This case required medical decision making of moderate complexity.  Primary osteoarthritis of first carpometacarpal joint of left hand Previous injection was 5 months ago, repeat first carpometacarpal joint injection as above.  Pes anserinus bursitis of right knee Rehabilitation exercises given, return in one month, injection if no better.

## 2016-09-14 ENCOUNTER — Other Ambulatory Visit: Payer: Self-pay | Admitting: Neurological Surgery

## 2016-09-14 DIAGNOSIS — M542 Cervicalgia: Secondary | ICD-10-CM

## 2016-09-19 ENCOUNTER — Ambulatory Visit (INDEPENDENT_AMBULATORY_CARE_PROVIDER_SITE_OTHER): Payer: Medicaid Other

## 2016-09-19 ENCOUNTER — Other Ambulatory Visit: Payer: Self-pay | Admitting: Neurological Surgery

## 2016-09-19 DIAGNOSIS — M4322 Fusion of spine, cervical region: Secondary | ICD-10-CM

## 2016-09-19 DIAGNOSIS — M542 Cervicalgia: Secondary | ICD-10-CM

## 2016-09-20 ENCOUNTER — Telehealth: Payer: Self-pay

## 2016-09-20 ENCOUNTER — Other Ambulatory Visit: Payer: Self-pay | Admitting: *Deleted

## 2016-09-20 DIAGNOSIS — M5010 Cervical disc disorder with radiculopathy, unspecified cervical region: Secondary | ICD-10-CM

## 2016-09-20 MED ORDER — TIZANIDINE HCL 4 MG PO CAPS: 6.0000 mg | ORAL_CAPSULE | Freq: Three times a day (TID) | ORAL | 3 refills | Status: DC

## 2016-09-20 MED ORDER — TIZANIDINE HCL 4 MG PO CAPS: 4.0000 mg | ORAL_CAPSULE | Freq: Three times a day (TID) | ORAL | 3 refills | Status: DC | PRN

## 2016-09-20 NOTE — Telephone Encounter (Signed)
[name] called about getting a PA for tizanidine. I called the pharmacy and they state medicaid will not pay for tizanidine 6 mg capsules but will pay for tizanidine 4 mg tablets. Do you want to proceed with a PA? Please send response to [facility].

## 2016-09-20 NOTE — Telephone Encounter (Signed)
Done

## 2016-09-20 NOTE — Telephone Encounter (Signed)
Ok to send over 4mg  tablets and not do PA.

## 2016-09-21 NOTE — Telephone Encounter (Signed)
Patient advised.

## 2016-09-25 ENCOUNTER — Other Ambulatory Visit: Payer: Self-pay | Admitting: Physician Assistant

## 2016-09-25 DIAGNOSIS — F331 Major depressive disorder, recurrent, moderate: Secondary | ICD-10-CM

## 2016-10-05 ENCOUNTER — Telehealth: Payer: Self-pay | Admitting: Physician Assistant

## 2016-10-05 MED ORDER — SCOPOLAMINE 1 MG/3DAYS TD PT72
1.0000 | MEDICATED_PATCH | TRANSDERMAL | 0 refills | Status: DC
Start: 2016-10-05 — End: 2016-12-20

## 2016-10-05 NOTE — Telephone Encounter (Signed)
Pt is going on cruise for 5 days and would like  a patch for her and her daughter. Thanks!

## 2016-10-05 NOTE — Telephone Encounter (Signed)
I sent patches to start 1-2 days before leave.

## 2016-10-06 ENCOUNTER — Ambulatory Visit (INDEPENDENT_AMBULATORY_CARE_PROVIDER_SITE_OTHER): Payer: Medicaid Other | Admitting: Sports Medicine

## 2016-10-06 DIAGNOSIS — M65312 Trigger thumb, left thumb: Secondary | ICD-10-CM | POA: Diagnosis not present

## 2016-10-06 DIAGNOSIS — M7051 Other bursitis of knee, right knee: Secondary | ICD-10-CM | POA: Diagnosis not present

## 2016-10-06 DIAGNOSIS — M1812 Unilateral primary osteoarthritis of first carpometacarpal joint, left hand: Secondary | ICD-10-CM | POA: Diagnosis not present

## 2016-10-06 NOTE — Assessment & Plan Note (Signed)
Resolved with conservative measures. 

## 2016-10-06 NOTE — Assessment & Plan Note (Signed)
Per patient request flexor pollicis longus tendon sheath injection today. Per my request hand therapy. Return in one month.

## 2016-10-06 NOTE — Progress Notes (Signed)
   Subjective:    I'm seeing this patient as a consultation for:  Michele Planas, PA-C  CC: Left thumb cramp  HPI: Right knee pain: Diagnosed pes bursitis after a fall, this has resolved.  Left trapeziometacarpal joint arthritis: Pain resolved after injection last month.  Left thumb cramping: Describes episodes of the thumb locking in a flexed position where she has to pull it out. Creates some volar sided pain. No trauma. Moderate, persistent. Desires interventional treatment today.  Past medical history:  Negative.  See flowsheet/record as well for more information.  Surgical history: Negative.  See flowsheet/record as well for more information.  Family history: Negative.  See flowsheet/record as well for more information.  Social history: Negative.  See flowsheet/record as well for more information.  Allergies, and medications have been entered into the medical record, reviewed, and no changes needed.   Review of Systems: No headache, visual changes, nausea, vomiting, diarrhea, constipation, dizziness, abdominal pain, skin rash, fevers, chills, night sweats, weight loss, swollen lymph nodes, body aches, joint swelling, muscle aches, chest pain, shortness of breath, mood changes, visual or auditory hallucinations.   Objective:   General: Well Developed, well nourished, and in no acute distress.  Neuro/Psych: Alert and oriented x3, extra-ocular muscles intact, able to move all 4 extremities, sensation grossly intact. Skin: Warm and dry, no rashes noted.  Respiratory: Not using accessory muscles, speaking in full sentences, trachea midline.  Cardiovascular: Pulses palpable, no extremity edema. Abdomen: Does not appear distended. Left hand: Palpable tenosynovitis, no palpable nodule, no visible triggering today in the thumb.  Procedure: Real-time Ultrasound Guided Injection of left flexor pollicis longus tendon sheath Device: GE Logiq E  Verbal informed consent obtained.  Time-out  conducted.  Noted no overlying erythema, induration, or other signs of local infection.  Skin prepped in a sterile fashion.  Local anesthesia: Topical Ethyl chloride.  With sterile technique and under real time ultrasound guidance:  25-gauge needle advanced into the tendon sheath and 1/2 mL Kenalog 40, 1/2 mL lidocaine injected easily. Completed without difficulty  Pain immediately resolved suggesting accurate placement of the medication.  Advised to call if fevers/chills, erythema, induration, drainage, or persistent bleeding.  Images permanently stored and available for review in the ultrasound unit.  Impression: Technically successful ultrasound guided injection.  Impression and Recommendations:   This case required medical decision making of moderate complexity.  Trigger thumb, left thumb Per patient request flexor pollicis longus tendon sheath injection today. Per my request hand therapy. Return in one month.  Primary osteoarthritis of first carpometacarpal joint of left hand Resolved with injection at the last visit  Pes anserinus bursitis of right knee Resolved with conservative measures.

## 2016-10-06 NOTE — Assessment & Plan Note (Signed)
Resolved with injection at the last visit

## 2016-10-10 ENCOUNTER — Ambulatory Visit (INDEPENDENT_AMBULATORY_CARE_PROVIDER_SITE_OTHER): Payer: Medicaid Other | Admitting: Family Medicine

## 2016-10-10 ENCOUNTER — Ambulatory Visit: Payer: Self-pay | Admitting: Physician Assistant

## 2016-10-10 ENCOUNTER — Encounter: Payer: Self-pay | Admitting: Family Medicine

## 2016-10-10 VITALS — BP 112/51 | HR 91 | Ht 63.39 in | Wt 139.0 lb

## 2016-10-10 DIAGNOSIS — N3001 Acute cystitis with hematuria: Secondary | ICD-10-CM

## 2016-10-10 DIAGNOSIS — Z7189 Other specified counseling: Secondary | ICD-10-CM | POA: Diagnosis not present

## 2016-10-10 DIAGNOSIS — R319 Hematuria, unspecified: Secondary | ICD-10-CM | POA: Diagnosis not present

## 2016-10-10 DIAGNOSIS — R300 Dysuria: Secondary | ICD-10-CM | POA: Diagnosis not present

## 2016-10-10 DIAGNOSIS — Z7184 Encounter for health counseling related to travel: Secondary | ICD-10-CM

## 2016-10-10 DIAGNOSIS — N3010 Interstitial cystitis (chronic) without hematuria: Secondary | ICD-10-CM

## 2016-10-10 LAB — POCT URINALYSIS DIPSTICK
Bilirubin, UA: NEGATIVE
Glucose, UA: NEGATIVE
KETONES UA: NEGATIVE
Nitrite, UA: NEGATIVE
PH UA: 6.5 (ref 5.0–8.0)
Protein, UA: NEGATIVE
Spec Grav, UA: 1.02 (ref 1.010–1.025)
Urobilinogen, UA: 0.2 E.U./dL

## 2016-10-10 MED ORDER — CIPROFLOXACIN HCL 500 MG PO TABS
500.0000 mg | ORAL_TABLET | Freq: Two times a day (BID) | ORAL | 0 refills | Status: AC
Start: 2016-10-10 — End: 2016-10-13

## 2016-10-10 MED ORDER — PENTOSAN POLYSULFATE SODIUM 100 MG PO CAPS: 100.0000 mg | ORAL_CAPSULE | Freq: Three times a day (TID) | ORAL | 3 refills | Status: DC | PRN

## 2016-10-10 NOTE — Patient Instructions (Signed)
Cut your tizanidine in half if you have to take it while on the antibiotic. If your symptoms do not improve after 3 days then please let us know. Avoid caffeine and things that irritate the bladder. Make sure drinking plenty of water.

## 2016-10-10 NOTE — Progress Notes (Signed)
Subjective:    Patient ID: [name], female    DOB: Jul 12, 1975, 41 y.o.   MRN: [id number]  HPI She is traveling on cruise and will need a scopolamine.  Rx was sent last week.   She is having 4 days of Urinary sxs.  She did take an old diflucan as she had some itching.  No fever, chills or sweats.   IC - she has been followed by Urology.  She was previously on Elmiron with Urology but they won't take her insurance anymore so wants to know if we can Rx it.    Review of Systems   BP (!) 112/51   Pulse 91   Ht 5' 3.39" (1.61 m)   Wt 139 lb (63 kg)   LMP 05/10/2015   SpO2 100%   BMI 24.32 kg/m     Allergies  Allergen Reactions  . Gadolinium Derivatives Hives, Itching and Other (See Comments)    After MRI with Multihance finished, patient had redness and itching on chest, tightness in throat.  Patient went to ED to be monitored (04/23/14).  . Prednisone Other (See Comments)    Pain all over, "it triggers my myofascial pain syndrome"   . Amitriptyline Other (See Comments)    Gained weight and does not want to be on.   . Eggs Or Egg-Derived Products Rash  . Hydrocodone-Acetaminophen Itching    Tolerates with benadryl (no reaction to oxycodone)  . Lunesta [Eszopiclone] Other (See Comments)    Metallic taste in mouth  . Tramadol Itching    Past Medical History:  Diagnosis Date  . Anxiety   . Asthma   . Complication of anesthesia   . Dysrhythmia    tachycardia  . Elevated heart rate and blood pressure    takes toprol for increased heart rate  . Fibromyalgia   . Headache(784.0)   . Hypertension    no longer takes meds for HTN  . IBS (irritable bowel syndrome)    questionable, per pt; saw Dr. Collene Mares  . IC (interstitial cystitis)   . Insomnia   . PONV (postoperative nausea and vomiting)     Past Surgical History:  Procedure Laterality Date  . CARPAL TUNNEL RELEASE Left 06/05/2014   Procedure: LEFT CARPAL TUNNEL RELEASE;  Surgeon: Daryll Brod, MD;  Location:  [place];  Service: Orthopedics;  Laterality: Left;  . CARPAL TUNNEL RELEASE Right 08/25/2014   Procedure: RIGHT CARPAL TUNNEL RELEASE;  Surgeon: Daryll Brod, MD;  Location: [place];  Service: Orthopedics;  Laterality: Right;  . CARPAL TUNNEL RELEASE Left 07/14/2015   Procedure: LEFT CARPAL TUNNEL RELEASE;  Surgeon: Daryll Brod, MD;  Location: [place];  Service: Orthopedics;  Laterality: Left;  . CERVICAL SPINE SURGERY  8/15  . CESAREAN SECTION    . DILATION AND EVACUATION N/A 05/07/2013   Procedure: DILATATION AND EVACUATION with tissue sent for chromosome analysis;  Surgeon: Lovenia Kim, MD;  Location: [facility];  Service: Gynecology;  Laterality: N/A;  . EPIDURAL STEROID INJECTION  03/20/15  . OVARIAN CYST REMOVAL    . PELVIC LAPAROSCOPY  2003   endometriosis  . POSTERIOR CERVICAL FUSION/FORAMINOTOMY N/A 10/28/2014   Procedure: Cervical Four-Cervical Seven Posterior cervical fusion with lateral mass fixation;  Surgeon: Eustace Moore, MD;  Location: [facility];  Service: Neurosurgery;  Laterality: N/A;  posterior  . ROBOTIC ASSISTED LAPAROSCOPIC LYSIS OF ADHESION N/A 07/18/2014   Procedure: ROBOTIC ASSISTED LAPAROSCOPIC  EXCISION POSTERIOR  UTERINE WALL MASS; EXCISION RIGHT MESSALEINGEAL MASS, EXCISION AND ABLATION CULDASAC ENDOMETRIOSIS;  Surgeon: Brien Few, MD;  Location: [facility];  Service: Gynecology;  Laterality: N/A;  . ROBOTIC ASSISTED TOTAL HYSTERECTOMY WITH SALPINGECTOMY Bilateral 05/11/2015   Procedure: ROBOTIC ASSISTED TOTAL HYSTERECTOMY WITH BILATERAL SALPINGECTOMY;  Surgeon: Brien Few, MD;  Location: [facility];  Service: Gynecology;  Laterality: Bilateral;    Social History   Social History  . Marital status: Divorced    Spouse name: N/A  . Number of children: 2  . Years of education: N/A   Occupational History  . 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 Smoking status: Current Some Day  Smoker    Packs/day: 0.50    Years: 18.00    Types: Cigarettes  . Smokeless tobacco: Never Used  . Alcohol use No  . Drug use: No  . Sexual activity: Yes    Birth control/ protection: Surgical   Other Topics Concern  . Not on file   Social History Narrative   Going through separation.  Children Sage and [place]    Family History  Problem Relation Age of Onset  . Heart attack Father   . ADD / ADHD Brother   . Depression Brother   . Depression Cousin     Outpatient Encounter Prescriptions as of 10/10/2016  Medication Sig  . busPIRone (BUSPAR) 10 MG tablet Take 1 tablet (10 mg total) by mouth 3 (three) times daily.  . clonazePAM (KLONOPIN) 1 MG tablet Take 1 tablet (1 mg total) by mouth 2 (two) times daily.  . DULoxetine (CYMBALTA) 60 MG capsule Take 1 capsule (60 mg total) by mouth daily.  [name] ketorolac (TORADOL) 10 MG tablet Take 1 tablet (10 mg total) by mouth every 6 (six) hours as needed.  . lamoTRIgine (LAMICTAL) 100 MG tablet Take 1 tablet (100 mg total) by mouth daily.  . metoprolol succinate (TOPROL-XL) 100 MG 24 hr tablet TAKE 1 TABLET (100 MG TOTAL) BY MOUTH DAILY. TAKE WITH OR IMMEDIATELY FOLLOWING A MEAL. (Patient taking differently: Take 50 mg by mouth daily. Take with or immediately following a meal.)  . pentosan polysulfate (ELMIRON) 100 MG capsule Take 1 capsule (100 mg total) by mouth 3 (three) times daily as needed.  . pregabalin (LYRICA) 150 MG capsule Take 1 capsule (150 mg total) by mouth 2 (two) times daily.  [name] scopolamine (TRANSDERM-SCOP, 1.5 MG,) 1 MG/3DAYS Place 1 patch (1.5 mg total) onto the skin every 3 (three) days.  . SYMBICORT 160-4.5 MCG/ACT inhaler INHALE 2 PUFFS INTO THE LUNGS 2 (TWO) TIMES DAILY.  [name] tiZANidine (ZANAFLEX) 4 MG capsule Take 1 capsule (4 mg total) by mouth 3 (three) times daily as needed for muscle spasms.  [name] zolpidem (AMBIEN) 10 MG tablet Take 1 tablet (10 mg total) by mouth at bedtime as needed. for sleep  . [DISCONTINUED] pentosan  polysulfate (ELMIRON) 100 MG capsule Take by mouth.  . ciprofloxacin (CIPRO) 500 MG tablet Take 1 tablet (500 mg total) by mouth 2 (two) times daily.   No facility-administered encounter medications on file as of 10/10/2016.           Objective:   Physical Exam  Constitutional: She is oriented to person, place, and time. She appears well-developed and well-nourished.  HENT:  Head: Normocephalic and atraumatic.  Cardiovascular: Normal rate, regular rhythm and normal heart sounds.   Pulmonary/Chest: Effort normal and breath sounds normal.  Neurological: She is alert and oriented to person,  place, and time.  Skin: Skin is warm and dry.  Psychiatric: She has a normal mood and affect. Her behavior is normal.          Assessment & Plan:  Travel- scopolamine patch will change every 3 days. She'll be gone for about 8 days.  Urinary tract infection-urinalysis is positive for leukocytes and blood. We'll go ahead and treat. Also consider that it could be an IC flare. We'll send for culture but since she's leaving town on Thursday and rather error on the side of treating then her be out of the country and not be able to get an antibiotic.  Interstitial cystitis-I will refill her Elmiron on for now but encouraged her to try to work on finding a new urologist that will take her new insurance.

## 2016-10-10 NOTE — Addendum Note (Signed)
Addended by: Teddy Spike on: 10/10/2016 03:49 PM   Modules accepted: Orders

## 2016-10-14 LAB — URINE CULTURE

## 2016-10-25 ENCOUNTER — Telehealth: Payer: Self-pay | Admitting: Physician Assistant

## 2016-10-25 NOTE — Telephone Encounter (Signed)
Patient called and stated she needs a new referral to Dr. Fredna Dow for her hand - CF

## 2016-10-26 ENCOUNTER — Telehealth: Payer: Self-pay | Admitting: *Deleted

## 2016-10-26 DIAGNOSIS — M65312 Trigger thumb, left thumb: Secondary | ICD-10-CM

## 2016-10-26 DIAGNOSIS — M1812 Unilateral primary osteoarthritis of first carpometacarpal joint, left hand: Secondary | ICD-10-CM

## 2016-10-26 NOTE — Telephone Encounter (Signed)
Done

## 2016-10-27 ENCOUNTER — Other Ambulatory Visit: Payer: Self-pay | Admitting: Physician Assistant

## 2016-10-27 DIAGNOSIS — F411 Generalized anxiety disorder: Secondary | ICD-10-CM

## 2016-10-31 NOTE — Telephone Encounter (Signed)
Amber pt states that Medicaid needing Jade's NPI number to put on  Their form and we supposedly have this form here?

## 2016-11-01 ENCOUNTER — Ambulatory Visit (INDEPENDENT_AMBULATORY_CARE_PROVIDER_SITE_OTHER): Payer: Medicaid Other

## 2016-11-01 ENCOUNTER — Ambulatory Visit (INDEPENDENT_AMBULATORY_CARE_PROVIDER_SITE_OTHER): Payer: Medicaid Other | Admitting: Family Medicine

## 2016-11-01 VITALS — BP 128/47 | HR 92 | Temp 98.1°F | Wt 142.0 lb

## 2016-11-01 DIAGNOSIS — M5010 Cervical disc disorder with radiculopathy, unspecified cervical region: Secondary | ICD-10-CM

## 2016-11-01 DIAGNOSIS — L03818 Cellulitis of other sites: Secondary | ICD-10-CM

## 2016-11-01 DIAGNOSIS — M79671 Pain in right foot: Secondary | ICD-10-CM

## 2016-11-01 MED ORDER — MUPIROCIN 2 % EX OINT: TOPICAL_OINTMENT | CUTANEOUS | 3 refills | Status: DC

## 2016-11-01 MED ORDER — CEPHALEXIN 500 MG PO CAPS: 500.0000 mg | ORAL_CAPSULE | Freq: Three times a day (TID) | ORAL | 0 refills | Status: DC

## 2016-11-01 MED ORDER — KETOROLAC TROMETHAMINE 10 MG PO TABS: 10.0000 mg | ORAL_TABLET | Freq: Four times a day (QID) | ORAL | 1 refills | Status: DC | PRN

## 2016-11-01 NOTE — Progress Notes (Signed)
[name] is a 41 y.o. female who presents to [facility] today for right fifth toe pain. Patient notes a one-day history of right fifth toe pain. Pain occurred after doing some exercise. She may have stubbed it although she is not quite sure. She notes redness and pain at the right fifth toe worse with walking better with rest. She denies any radiating pain weakness or numbness fevers or chills. She has not tried treatment yet.   Past Medical History:  Diagnosis Date  . Anxiety   . Asthma   . Complication of anesthesia   . Dysrhythmia    tachycardia  . Elevated heart rate and blood pressure    takes toprol for increased heart rate  . Fibromyalgia   . Headache(784.0)   . Hypertension    no longer takes meds for HTN  . IBS (irritable bowel syndrome)    questionable, per pt; saw Dr. Collene Mares  . IC (interstitial cystitis)   . Insomnia   . PONV (postoperative nausea and vomiting)    Past Surgical History:  Procedure Laterality Date  . CARPAL TUNNEL RELEASE Left 06/05/2014   Procedure: LEFT CARPAL TUNNEL RELEASE;  Surgeon: Daryll Brod, MD;  Location: [facility];  Service: Orthopedics;  Laterality: Left;  . CARPAL TUNNEL RELEASE Right 08/25/2014   Procedure: RIGHT CARPAL TUNNEL RELEASE;  Surgeon: Daryll Brod, MD;  Location: [place];  Service: Orthopedics;  Laterality: Right;  . CARPAL TUNNEL RELEASE Left 07/14/2015   Procedure: LEFT CARPAL TUNNEL RELEASE;  Surgeon: Daryll Brod, MD;  Location: [facility];  Service: Orthopedics;  Laterality: Left;  . CERVICAL SPINE SURGERY  8/15  . CESAREAN SECTION    . DILATION AND EVACUATION N/A 05/07/2013   Procedure: DILATATION AND EVACUATION with tissue sent for chromosome analysis;  Surgeon: Lovenia Kim, MD;  Location: [facility];  Service: Gynecology;  Laterality: N/A;  . EPIDURAL STEROID INJECTION  03/20/15  . OVARIAN CYST REMOVAL    . PELVIC  LAPAROSCOPY  2003   endometriosis  . POSTERIOR CERVICAL FUSION/FORAMINOTOMY N/A 10/28/2014   Procedure: Cervical Four-Cervical Seven Posterior cervical fusion with lateral mass fixation;  Surgeon: Eustace Moore, MD;  Location: [facility];  Service: Neurosurgery;  Laterality: N/A;  posterior  . ROBOTIC ASSISTED LAPAROSCOPIC LYSIS OF ADHESION N/A 07/18/2014   Procedure: ROBOTIC ASSISTED LAPAROSCOPIC  EXCISION POSTERIOR  UTERINE WALL MASS; EXCISION RIGHT MESSALEINGEAL MASS, EXCISION AND ABLATION CULDASAC ENDOMETRIOSIS;  Surgeon: Brien Few, MD;  Location: [facility];  Service: Gynecology;  Laterality: N/A;  . ROBOTIC ASSISTED TOTAL HYSTERECTOMY WITH SALPINGECTOMY Bilateral 05/11/2015   Procedure: ROBOTIC ASSISTED TOTAL HYSTERECTOMY WITH BILATERAL SALPINGECTOMY;  Surgeon: Brien Few, MD;  Location: [facility];  Service: Gynecology;  Laterality: Bilateral;   Social History  Substance Use Topics  . Smoking status: Current Some Day Smoker    Packs/day: 0.50    Years: 18.00    Types: Cigarettes  . Smokeless tobacco: Never Used  . Alcohol use No     ROS:  As above   Medications: Current Outpatient Prescriptions  Medication Sig Dispense Refill  . busPIRone (BUSPAR) 10 MG tablet Take 1 tablet (10 mg total) by mouth 3 (three) times daily. 90 tablet 1  . cephALEXin (KEFLEX) 500 MG capsule Take 1 capsule (500 mg total) by mouth 3 (three) times daily. 21 capsule 0  . clonazePAM (KLONOPIN) 1 MG tablet Take 1 tablet (1 mg total) by mouth 2 (two)  times daily. Due for follow up on mood. 60 tablet 0  . DULoxetine (CYMBALTA) 60 MG capsule Take 1 capsule (60 mg total) by mouth daily. 90 capsule 1  . ketorolac (TORADOL) 10 MG tablet Take 1 tablet (10 mg total) by mouth every 6 (six) hours as needed. 20 tablet 1  . lamoTRIgine (LAMICTAL) 100 MG tablet Take 1 tablet (100 mg total) by mouth daily. 30 tablet 5  . metoprolol succinate (TOPROL-XL) 100 MG 24 hr tablet TAKE 1 TABLET (100 MG TOTAL) BY MOUTH DAILY.  TAKE WITH OR IMMEDIATELY FOLLOWING A MEAL. (Patient taking differently: Take 50 mg by mouth daily. Take with or immediately following a meal.) 90 tablet 0  . mupirocin ointment (BACTROBAN) 2 % Apply to affected area BID for 7 days. 30 g 3  . pentosan polysulfate (ELMIRON) 100 MG capsule Take 1 capsule (100 mg total) by mouth 3 (three) times daily as needed. 90 capsule 3  . pregabalin (LYRICA) 150 MG capsule Take 1 capsule (150 mg total) by mouth 2 (two) times daily. 180 capsule 1  . scopolamine (TRANSDERM-SCOP, 1.5 MG,) 1 MG/3DAYS Place 1 patch (1.5 mg total) onto the skin every 3 (three) days. 10 patch 0  . SYMBICORT 160-4.5 MCG/ACT inhaler INHALE 2 PUFFS INTO THE LUNGS 2 (TWO) TIMES DAILY. 10.2 Inhaler 2  . tiZANidine (ZANAFLEX) 4 MG capsule Take 1 capsule (4 mg total) by mouth 3 (three) times daily as needed for muscle spasms. 60 capsule 3  . zolpidem (AMBIEN) 10 MG tablet Take 1 tablet (10 mg total) by mouth at bedtime as needed. for sleep 30 tablet 5   No current facility-administered medications for this visit.    Allergies  Allergen Reactions  . Gadolinium Derivatives Hives, Itching and Other (See Comments)    After MRI with Multihance finished, patient had redness and itching on chest, tightness in throat.  Patient went to ED to be monitored (04/23/14).  . Prednisone Other (See Comments)    Pain all over, "it triggers my myofascial pain syndrome"   . Amitriptyline Other (See Comments)    Gained weight and does not want to be on.   . Eggs Or Egg-Derived Products Rash  . Hydrocodone-Acetaminophen Itching    Tolerates with benadryl (no reaction to oxycodone)  . Lunesta [Eszopiclone] Other (See Comments)    Metallic taste in mouth  . Tramadol Itching     Exam:  BP (!) 128/47 (BP Location: Right Arm, Patient Position: Sitting, Cuff Size: Normal)   Pulse 92   Temp 98.1 F (36.7 C) (Oral)   Wt 142 lb (64.4 kg)   LMP 05/10/2015   SpO2 100%   BMI 24.85 kg/m  General: Well  Developed, well nourished, and in no acute distress.  Neuro/Psych: Alert and oriented x3, extra-ocular muscles intact, able to move all 4 extremities, sensation grossly intact. Skin: Warm and dry, no rashes noted.  Respiratory: Not using accessory muscles, speaking in full sentences, trachea midline.  Cardiovascular: Pulses palpable, no extremity edema. Abdomen: Does not appear distended. MSK:  Right toe erythematous with a small blister at the dorsal lateral aspect. Tender to touch. Pain with motion. Capillary refill and sensation are intact.    No results found for this or any previous visit (from the past 48 hour(s)). Dg Foot Complete Right  Result Date: 11/01/2016 CLINICAL DATA:  Right foot pain, no recent injury EXAM: RIGHT FOOT COMPLETE - 3+ VIEW COMPARISON:  None. FINDINGS: Tarsal-metatarsal alignment is normal. Joint spaces appear normal. No  fracture is seen. No erosion is noted. No opaque foreign body is seen IMPRESSION: Negative. Electronically Signed   By: Ivar Drape M.D.   On: 11/01/2016 14:01      Assessment and Plan: 41 y.o. female with blister with possible secondary cellulitis right toe. X-ray normal. Treat with Keflex antibiotics for cellulitis. Additionally we'll use pre-existing condition of Toradol for pain control as she has tolerated this well and the past. Additionally we'll use mupirocin antibiotic ointment.    Orders Placed This Encounter  Procedures  . DG Foot Complete Right    Standing Status:   Future    Number of Occurrences:   1    Standing Expiration Date:   01/02/2018    Order Specific Question:   Reason for Exam (SYMPTOM  OR DIAGNOSIS REQUIRED)    Answer:   eval lateral foot pain    Order Specific Question:   Is patient pregnant?    Answer:   No    Order Specific Question:   Preferred imaging location?    Answer:   [facility]    Order Specific Question:   Radiology Contrast Protocol - do NOT remove file path    Answer:    [email or web address]   Meds ordered this encounter  Medications  . cephALEXin (KEFLEX) 500 MG capsule    Sig: Take 1 capsule (500 mg total) by mouth 3 (three) times daily.    Dispense:  21 capsule    Refill:  0  . mupirocin ointment (BACTROBAN) 2 %    Sig: Apply to affected area BID for 7 days.    Dispense:  30 g    Refill:  3  . ketorolac (TORADOL) 10 MG tablet    Sig: Take 1 tablet (10 mg total) by mouth every 6 (six) hours as needed.    Dispense:  20 tablet    Refill:  1    Discussed warning signs or symptoms. Please see discharge instructions. Patient expresses understanding.

## 2016-11-01 NOTE — Patient Instructions (Addendum)
Thank you for coming in today. Take keflex three times daily.  Apply the ointment daily or twice daily.  Use the shoe as needed.  Follow up as scheduled with Dr T.    Cellulitis, Adult Cellulitis is a skin infection. The infected area is usually red and tender. This condition occurs most often in the arms and lower legs. The infection can travel to the muscles, blood, and underlying tissue and become serious. It is very important to get treated for this condition. What are the causes? Cellulitis is caused by bacteria. The bacteria enter through a break in the skin, such as a cut, burn, insect bite, open sore, or crack. What increases the risk? This condition is more likely to occur in people who:  Have a weak defense system (immune system).  Have open wounds on the skin such as cuts, burns, bites, and scrapes. Bacteria can enter the body through these open wounds.  Are older.  Have diabetes.  Have a type of long-lasting (chronic) liver disease (cirrhosis) or kidney disease.  Use IV drugs.  What are the signs or symptoms? Symptoms of this condition include:  Redness, streaking, or spotting on the skin.  Swollen area of the skin.  Tenderness or pain when an area of the skin is touched.  Warm skin.  Fever.  Chills.  Blisters.  How is this diagnosed? This condition is diagnosed based on a medical history and physical exam. You may also have tests, including:  Blood tests.  Lab tests.  Imaging tests.  How is this treated? Treatment for this condition may include:  Medicines, such as antibiotic medicines or antihistamines.  Supportive care, such as rest and application of cold or warm cloths (cold or warm compresses) to the skin.  Hospital care, if the condition is severe.  The infection usually gets better within 1-2 days of treatment. Follow these instructions at home:  Take over-the-counter and prescription medicines only as told by your health care  provider.  If you were prescribed an antibiotic medicine, take it as told by your health care provider. Do not stop taking the antibiotic even if you start to feel better.  Drink enough fluid to keep your urine clear or pale yellow.  Do not touch or rub the infected area.  Raise (elevate) the infected area above the level of your heart while you are sitting or lying down.  Apply warm or cold compresses to the affected area as told by your health care provider.  Keep all follow-up visits as told by your health care provider. This is important. These visits let your health care provider make sure a more serious infection is not developing. Contact a health care provider if:  You have a fever.  Your symptoms do not improve within 1-2 days of starting treatment.  Your bone or joint underneath the infected area becomes painful after the skin has healed.  Your infection returns in the same area or another area.  You notice a swollen bump in the infected area.  You develop new symptoms.  You have a general ill feeling (malaise) with muscle aches and pains. Get help right away if:  Your symptoms get worse.  You feel very sleepy.  You develop vomiting or diarrhea that persists.  You notice red streaks coming from the infected area.  Your red area gets larger or turns dark in color. This information is not intended to replace advice given to you by your health care provider. Make sure you discuss  any questions you have with your health care provider. Document Released: 01/26/2005 Document Revised: 08/27/2015 Document Reviewed: 02/25/2015 Elsevier Interactive Patient Education  2017 [employer].

## 2016-11-03 ENCOUNTER — Ambulatory Visit: Payer: Self-pay | Admitting: Sports Medicine

## 2016-11-04 ENCOUNTER — Telehealth: Payer: Self-pay | Admitting: *Deleted

## 2016-11-04 NOTE — Telephone Encounter (Signed)
Pt stated that she was seen at [facility] for cellulitis at [facility] and f/u with Dr. Georgina Snell also the same day 11/01/16. She reports that she has been in pain. I asked her if she has been keeping her foot elevated? Asked if the foot has any streaking or if it is swelling anymore, is she running a fever, and if she is taking her medication as prescribed. She stated that she has been keeping her foot elevated and that she has not noticed any streaking or swelling, no fever and has been taking her meds. She stated that she has been taking tylenol because this is the only thing that she can take. I asked if her bandage was to tight she stated that she has checked this and it is not. Will fwd to Dr. Madilyn Fireman for advice.[name]

## 2016-11-05 NOTE — Telephone Encounter (Signed)
This need to go to Dr. Georgina Snell. I haven't assessed her ankle.  She needs to see Dr. Georgina Snell or Dr. Darene Lamer. She was supposed to follow up with T on 7/5.

## 2016-11-06 ENCOUNTER — Other Ambulatory Visit: Payer: Self-pay | Admitting: Physician Assistant

## 2016-11-06 DIAGNOSIS — R059 Cough, unspecified: Secondary | ICD-10-CM

## 2016-11-06 DIAGNOSIS — R05 Cough: Secondary | ICD-10-CM

## 2016-11-07 ENCOUNTER — Ambulatory Visit: Payer: Self-pay | Admitting: Family Medicine

## 2016-11-07 NOTE — Telephone Encounter (Signed)
Called pt and informed her that she will need to f/u with either Dr. Darene Lamer or Dr. Georgina Snell for her ankle. Pt reports that she doesn't remember making this appt. I read to her what the appt was made for. She asked that I reschedule her for when Luvenia Starch returns. I cancelled her appt for Dr. Madilyn Fireman today and rescheduled her w/pcp.Audelia Hives [place]

## 2016-11-09 NOTE — Telephone Encounter (Signed)
We do not have any Medicaid forms here.

## 2016-11-21 ENCOUNTER — Telehealth: Payer: Self-pay

## 2016-11-21 NOTE — Telephone Encounter (Signed)
Pt is requesting some zofran. Nausea, vomiting, low grade fever, and no appetite since Friday. I did let pt know that this request is not appropriate without being seen. She does not want to drive to office for visit. Please advise. -EH/RMA

## 2016-11-21 NOTE — Telephone Encounter (Signed)
Please let patient know we can't write prescription medication for an acute problem without evaluating the patient.

## 2016-11-21 NOTE — Telephone Encounter (Signed)
Recommendations left on vm -EH/RMA  

## 2016-11-23 ENCOUNTER — Encounter: Payer: Self-pay | Admitting: Physician Assistant

## 2016-11-23 ENCOUNTER — Ambulatory Visit (INDEPENDENT_AMBULATORY_CARE_PROVIDER_SITE_OTHER): Payer: Medicaid Other | Admitting: Physician Assistant

## 2016-11-23 ENCOUNTER — Other Ambulatory Visit: Payer: Self-pay | Admitting: Family Medicine

## 2016-11-23 VITALS — BP 104/70 | HR 72 | Temp 98.0°F | Resp 18 | Wt 140.0 lb

## 2016-11-23 DIAGNOSIS — R112 Nausea with vomiting, unspecified: Secondary | ICD-10-CM

## 2016-11-23 DIAGNOSIS — F411 Generalized anxiety disorder: Secondary | ICD-10-CM

## 2016-11-23 MED ORDER — PROMETHAZINE HCL 12.5 MG PO TABS
12.5000 mg | ORAL_TABLET | Freq: Three times a day (TID) | ORAL | 0 refills | Status: DC | PRN
Start: 2016-11-23 — End: 2016-12-20

## 2016-11-23 NOTE — Progress Notes (Signed)
HPI:                                                                [name] is a 41 y.o. female who presents to [place]: [place] today for nausea and vomiting  Patient reports nausea and vomiting x 5 days.   Emesis   This is a new problem. The current episode started in the past 7 days. The problem occurs 2 to 4 times per day. The problem has been gradually improving. The emesis has an appearance of stomach contents and bile. There has been no fever ("tactile fevers"). Associated symptoms include chills, dizziness, headaches and sweats. Pertinent negatives include no abdominal pain, diarrhea or fever. Risk factors include ill contacts. She has tried bed rest, diet change and increased fluids for the symptoms.     Past Medical History:  Diagnosis Date  . Anxiety   . Asthma   . Complication of anesthesia   . Dysrhythmia    tachycardia  . Elevated heart rate and blood pressure    takes toprol for increased heart rate  . Fibromyalgia   . Headache(784.0)   . Hypertension    no longer takes meds for HTN  . IBS (irritable bowel syndrome)    questionable, per pt; saw Dr. Collene Mares  . IC (interstitial cystitis)   . Insomnia   . PONV (postoperative nausea and vomiting)    Past Surgical History:  Procedure Laterality Date  . CARPAL TUNNEL RELEASE Left 06/05/2014   Procedure: LEFT CARPAL TUNNEL RELEASE;  Surgeon: Daryll Brod, MD;  Location: [place];  Service: Orthopedics;  Laterality: Left;  . CARPAL TUNNEL RELEASE Right 08/25/2014   Procedure: RIGHT CARPAL TUNNEL RELEASE;  Surgeon: Daryll Brod, MD;  Location: [place];  Service: Orthopedics;  Laterality: Right;  . CARPAL TUNNEL RELEASE Left 07/14/2015   Procedure: LEFT CARPAL TUNNEL RELEASE;  Surgeon: Daryll Brod, MD;  Location: [place];  Service: Orthopedics;  Laterality: Left;  . CERVICAL SPINE SURGERY  8/15  . CESAREAN SECTION    .  DILATION AND EVACUATION N/A 05/07/2013   Procedure: DILATATION AND EVACUATION with tissue sent for chromosome analysis;  Surgeon: Lovenia Kim, MD;  Location: [facility];  Service: Gynecology;  Laterality: N/A;  . EPIDURAL STEROID INJECTION  03/20/15  . OVARIAN CYST REMOVAL    . PELVIC LAPAROSCOPY  2003   endometriosis  . POSTERIOR CERVICAL FUSION/FORAMINOTOMY N/A 10/28/2014   Procedure: Cervical Four-Cervical Seven Posterior cervical fusion with lateral mass fixation;  Surgeon: Eustace Moore, MD;  Location: [facility];  Service: Neurosurgery;  Laterality: N/A;  posterior  . ROBOTIC ASSISTED LAPAROSCOPIC LYSIS OF ADHESION N/A 07/18/2014   Procedure: ROBOTIC ASSISTED LAPAROSCOPIC  EXCISION POSTERIOR  UTERINE WALL MASS; EXCISION RIGHT MESSALEINGEAL MASS, EXCISION AND ABLATION CULDASAC ENDOMETRIOSIS;  Surgeon: Brien Few, MD;  Location: [facility];  Service: Gynecology;  Laterality: N/A;  . ROBOTIC ASSISTED TOTAL HYSTERECTOMY WITH SALPINGECTOMY Bilateral 05/11/2015   Procedure: ROBOTIC ASSISTED TOTAL HYSTERECTOMY WITH BILATERAL SALPINGECTOMY;  Surgeon: Brien Few, MD;  Location: [facility];  Service: Gynecology;  Laterality: Bilateral;   Social History  Substance Use Topics  . Smoking status: Current Some Day Smoker    Packs/day: 0.50  Years: 18.00    Types: Cigarettes  . Smokeless tobacco: Never Used  . Alcohol use No   family history includes ADD / ADHD in her brother; Depression in her brother and cousin; Heart attack in her father.  ROS: negative except as noted in the HPI  Medications: Current Outpatient Prescriptions  Medication Sig Dispense Refill  . busPIRone (BUSPAR) 10 MG tablet Take 1 tablet (10 mg total) by mouth 3 (three) times daily. 90 tablet 1  . cephALEXin (KEFLEX) 500 MG capsule Take 1 capsule (500 mg total) by mouth 3 (three) times daily. 21 capsule 0  . clonazePAM (KLONOPIN) 1 MG tablet TAKE 1 TABLET BY MOUTH TWICE A DAY 60 tablet 0  . DULoxetine (CYMBALTA) 60 MG  capsule Take 1 capsule (60 mg total) by mouth daily. 90 capsule 1  . ketorolac (TORADOL) 10 MG tablet Take 1 tablet (10 mg total) by mouth every 6 (six) hours as needed. 20 tablet 1  . lamoTRIgine (LAMICTAL) 100 MG tablet Take 1 tablet (100 mg total) by mouth daily. 30 tablet 5  . metoprolol succinate (TOPROL-XL) 100 MG 24 hr tablet TAKE 1 TABLET (100 MG TOTAL) BY MOUTH DAILY. TAKE WITH OR IMMEDIATELY FOLLOWING A MEAL. (Patient taking differently: Take 50 mg by mouth daily. Take with or immediately following a meal.) 90 tablet 0  . mupirocin ointment (BACTROBAN) 2 % Apply to affected area BID for 7 days. 30 g 3  . pentosan polysulfate (ELMIRON) 100 MG capsule Take 1 capsule (100 mg total) by mouth 3 (three) times daily as needed. 90 capsule 3  . pregabalin (LYRICA) 150 MG capsule Take 1 capsule (150 mg total) by mouth 2 (two) times daily. 180 capsule 1  . scopolamine (TRANSDERM-SCOP, 1.5 MG,) 1 MG/3DAYS Place 1 patch (1.5 mg total) onto the skin every 3 (three) days. 10 patch 0  . SYMBICORT 160-4.5 MCG/ACT inhaler INHALE 2 PUFFS INTO THE LUNGS 2 (TWO) TIMES DAILY. 10.2 Inhaler 2  . tiZANidine (ZANAFLEX) 4 MG capsule Take 1 capsule (4 mg total) by mouth 3 (three) times daily as needed for muscle spasms. 60 capsule 3  . zolpidem (AMBIEN) 10 MG tablet Take 1 tablet (10 mg total) by mouth at bedtime as needed. for sleep 30 tablet 5  . promethazine (PHENERGAN) 12.5 MG tablet Take 1 tablet (12.5 mg total) by mouth every 8 (eight) hours as needed for nausea or vomiting. 10 tablet 0   No current facility-administered medications for this visit.    Allergies  Allergen Reactions  . Gadolinium Derivatives Hives, Itching and Other (See Comments)    After MRI with Multihance finished, patient had redness and itching on chest, tightness in throat.  Patient went to ED to be monitored (04/23/14).  . Prednisone Other (See Comments)    Pain all over, "it triggers my myofascial pain syndrome"   . Amitriptyline  Other (See Comments)    Gained weight and does not want to be on.   . Eggs Or Egg-Derived Products Rash  . Hydrocodone-Acetaminophen Itching    Tolerates with benadryl (no reaction to oxycodone)  . Lunesta [Eszopiclone] Other (See Comments)    Metallic taste in mouth  . Tramadol Itching     Objective:  BP 104/70   Pulse 72   Temp 98 F (36.7 C)   Resp 18   Wt 140 lb (63.5 kg)   LMP 05/10/2015   BMI 24.50 kg/m  Gen: well-groomed, cooperative, not ill-appearing, no distress HEENT: normal conjunctiva, oropharynx clear, moist  mucus membranes, neck supple, trachea midline Pulm: Normal work of breathing, normal phonation, clear to auscultation bilaterally, no wheezes, rales or rhonchi CV: Normal rate, regular rhythm, s1 and s2 distinct, no murmurs, clicks or rubs  GI: bowel sounds active, abdomen soft, nondistended, nontender Neuro: alert and oriented x 3, EOM's intact, no tremor MSK: extremities atraumatic, normal gait and station, no peripheral edema Lymph: no cervical or tonsillar adenopathy Skin: warm, dry, intact; no rashes on exposed skin   No results found for this or any previous visit (from the past 72 hour(s)). No results found.    Assessment and Plan: 41 y.o. female with   1. Nausea and vomiting in adult - vital signs normal, abdomen benign - symptomatic management - Phenergan 1 tablet every 6-8 hours as needed for nausea/vomiting - Tylenol 1000mg  every 8 hours as needed for headache - Bland diet - Continue pushing fluids - Return if no better in 3 days - promethazine (PHENERGAN) 12.5 MG tablet; Take 1 tablet (12.5 mg total) by mouth every 8 (eight) hours as needed for nausea or vomiting.  Dispense: 10 tablet; Refill: 0   Patient education and anticipatory guidance given Patient agrees with treatment plan Follow-up as needed if symptoms worsen or fail to improve  Darlyne Russian PA-C

## 2016-11-23 NOTE — Patient Instructions (Addendum)
- Phenergan 1 tablet every 6-8 hours as needed for nausea/vomiting - Tylenol 1000mg  every 8 hours as needed for headache - Bland diet - Continue pushing fluids - Return if no better in 3 days   Nausea and Vomiting, Adult Nausea is the feeling that you have an upset stomach or have to vomit. As nausea gets worse, it can lead to vomiting. Vomiting occurs when stomach contents are thrown up and out of the mouth. Vomiting can make you feel weak and cause you to become dehydrated. Dehydration can make you tired and thirsty, cause you to have a dry mouth, and decrease how often you urinate. Older adults and people with other diseases or a weak immune system are at higher risk for dehydration. It is important to treat your nausea and vomiting as told by your health care provider. Follow these instructions at home: Follow instructions from your health care provider about how to care for yourself at home. Eating and drinking Follow these recommendations as told by your health care provider:  Take an oral rehydration solution (ORS). This is a drink that is sold at pharmacies and retail stores.  Drink clear fluids in small amounts as you are able. Clear fluids include water, ice chips, diluted fruit juice, and low-calorie sports drinks.  Eat bland, easy-to-digest foods in small amounts as you are able. These foods include bananas, applesauce, rice, lean meats, toast, and crackers.  Avoid fluids that contain a lot of sugar or caffeine, such as energy drinks, sports drinks, and soda.  Avoid alcohol.  Avoid spicy or fatty foods.  General instructions  Drink enough fluid to keep your urine clear or pale yellow.  Wash your hands often. If soap and water are not available, use hand sanitizer.  Make sure that all people in your household wash their hands well and often.  Take over-the-counter and prescription medicines only as told by your health care provider.  Rest at home while you  recover.  Watch your condition for any changes.  Breathe slowly and deeply when you feel nauseated.  Keep all follow-up visits as told by your health care provider. This is important. Contact a health care provider if:  You have a fever.  You cannot keep fluids down.  Your symptoms get worse.  You have new symptoms.  Your nausea does not go away after two days.  You feel light-headed or dizzy.  You have a headache.  You have muscle cramps. Get help right away if:  You have pain in your chest, neck, arm, or jaw.  You feel extremely weak or you faint.  You have persistent vomiting.  You see blood in your vomit.  Your vomit looks like black coffee grounds.  You have bloody or black stools or stools that look like tar.  You have a severe headache, a stiff neck, or both.  You have a rash.  You have severe pain, cramping, or bloating in your abdomen.  You have trouble breathing or you are breathing very quickly.  Your heart is beating very quickly.  Your skin feels cold and clammy.  You feel confused.  You have pain when you urinate.  You have signs of dehydration, such as: ? Dark urine, very little urine, or no urine. ? Cracked lips. ? Dry mouth. ? Sunken eyes. ? Sleepiness. ? Weakness. These symptoms may represent a serious problem that is an emergency. Do not wait to see if the symptoms will go away. Get medical help right away. Call  your local emergency services (911 in the [place] Do not drive yourself to the hospital. This information is not intended to replace advice given to you by your health care provider. Make sure you discuss any questions you have with your health care provider. Document Released: 04/18/2005 Document Revised: 09/21/2015 Document Reviewed: 12/23/2014 Elsevier Interactive Patient Education  2017 [employer].

## 2016-11-25 ENCOUNTER — Other Ambulatory Visit: Payer: Self-pay | Admitting: Physician Assistant

## 2016-11-25 ENCOUNTER — Telehealth: Payer: Self-pay

## 2016-11-25 DIAGNOSIS — M5010 Cervical disc disorder with radiculopathy, unspecified cervical region: Secondary | ICD-10-CM

## 2016-11-25 DIAGNOSIS — R112 Nausea with vomiting, unspecified: Secondary | ICD-10-CM

## 2016-11-25 MED ORDER — ONDANSETRON HCL 4 MG PO TABS: 4.0000 mg | ORAL_TABLET | Freq: Three times a day (TID) | ORAL | 0 refills | Status: DC | PRN

## 2016-11-25 NOTE — Telephone Encounter (Signed)
Pt c/o continued nausea while taking phenergan. She is dry heaving as well. Please advise. -EH/RMA

## 2016-11-25 NOTE — Telephone Encounter (Signed)
Prescription sent for zofran Go to the emergency department if nausea and vomiting is intractable

## 2016-11-29 ENCOUNTER — Other Ambulatory Visit: Payer: Self-pay | Admitting: Physician Assistant

## 2016-12-02 ENCOUNTER — Other Ambulatory Visit: Payer: Self-pay | Admitting: Physician Assistant

## 2016-12-17 ENCOUNTER — Other Ambulatory Visit: Payer: Self-pay | Admitting: Physician Assistant

## 2016-12-17 ENCOUNTER — Other Ambulatory Visit: Payer: Self-pay | Admitting: Family Medicine

## 2016-12-17 DIAGNOSIS — M5010 Cervical disc disorder with radiculopathy, unspecified cervical region: Secondary | ICD-10-CM

## 2016-12-17 DIAGNOSIS — F411 Generalized anxiety disorder: Secondary | ICD-10-CM

## 2016-12-20 ENCOUNTER — Encounter: Payer: Self-pay | Admitting: Physician Assistant

## 2016-12-20 ENCOUNTER — Ambulatory Visit (INDEPENDENT_AMBULATORY_CARE_PROVIDER_SITE_OTHER): Payer: Medicaid Other | Admitting: Physician Assistant

## 2016-12-20 VITALS — BP 127/85 | HR 72 | Ht 63.0 in | Wt 139.0 lb

## 2016-12-20 DIAGNOSIS — F411 Generalized anxiety disorder: Secondary | ICD-10-CM

## 2016-12-20 DIAGNOSIS — M7918 Myalgia, other site: Secondary | ICD-10-CM

## 2016-12-20 DIAGNOSIS — F331 Major depressive disorder, recurrent, moderate: Secondary | ICD-10-CM | POA: Diagnosis not present

## 2016-12-20 DIAGNOSIS — M791 Myalgia: Secondary | ICD-10-CM

## 2016-12-20 DIAGNOSIS — M5010 Cervical disc disorder with radiculopathy, unspecified cervical region: Secondary | ICD-10-CM

## 2016-12-20 DIAGNOSIS — Z981 Arthrodesis status: Secondary | ICD-10-CM | POA: Diagnosis not present

## 2016-12-20 MED ORDER — CARISOPRODOL 250 MG PO TABS: 250.0000 mg | ORAL_TABLET | Freq: Three times a day (TID) | ORAL | 0 refills | Status: DC

## 2016-12-20 MED ORDER — DULOXETINE HCL 60 MG PO CPEP: 60.0000 mg | ORAL_CAPSULE | Freq: Every day | ORAL | 1 refills | Status: DC

## 2016-12-20 MED ORDER — PREGABALIN 150 MG PO CAPS: 150.0000 mg | ORAL_CAPSULE | Freq: Two times a day (BID) | ORAL | 1 refills | Status: DC

## 2016-12-20 MED ORDER — LAMOTRIGINE 100 MG PO TABS: 100.0000 mg | ORAL_TABLET | Freq: Every day | ORAL | 5 refills | Status: DC

## 2016-12-20 MED ORDER — BUSPIRONE HCL 10 MG PO TABS: 10.0000 mg | ORAL_TABLET | Freq: Three times a day (TID) | ORAL | 1 refills | Status: DC

## 2016-12-20 MED ORDER — DICLOFENAC SODIUM 1 % TD GEL
4.0000 g | Freq: Four times a day (QID) | TRANSDERMAL | 5 refills | Status: DC
Start: 2016-12-20 — End: 2017-03-08

## 2016-12-20 MED ORDER — CLONAZEPAM 1 MG PO TABS: 1.0000 mg | ORAL_TABLET | Freq: Two times a day (BID) | ORAL | 4 refills | Status: DC

## 2016-12-20 MED ORDER — KETOROLAC TROMETHAMINE 10 MG PO TABS: 10.0000 mg | ORAL_TABLET | Freq: Four times a day (QID) | ORAL | 1 refills | Status: DC | PRN

## 2016-12-20 NOTE — Progress Notes (Signed)
Subjective:    Patient ID: [name], female    DOB: 12-27-1975, 41 y.o.   MRN: [id number]  HPI  Pt is a 41 yo female with cervical disc pain and MDD she is really frustrated today because her pain is bad and nothing is helping it. She has appt today with Dr. Orpah Melter for ESI injection. She tried PT but just made pain worse. She would like to replace zanaflex with another muscle relaxer.   She needs refill on cymbalta and lamictal.   .. Active Ambulatory Problems    Diagnosis Date Noted  . Anxiety state 07/06/2007  . Smoker 02/05/2010  . Depressed 06/26/2008  . INTERSTITIAL CYSTITIS 02/05/2010  . Insomnia 07/06/2007  . FATIGUE 07/02/2009  . HEADACHE 07/06/2007  . TROCHANTERIC BURSITIS 06/21/2010  . Tick-borne disease 08/09/2010  . PTSD (post-traumatic stress disorder) 12/14/2012  . Abnormal weight gain 11/29/2013  . Greater trochanteric bursitis 11/29/2013  . Constipation due to opioid therapy 01/17/2014  . Left shoulder pain 04/02/2014  . Carpal tunnel syndrome of left wrist 05/05/2014  . Myofascial pain syndrome 05/29/2014  . Paresthesia 06/03/2014  . Endometriosis 07/20/2014  . Tachycardia 08/13/2014  . S/P cervical spinal fusion 10/28/2014  . Therapeutic opioid induced constipation 12/16/2014  . Vasomotor flushing 06/15/2015  . GAD (generalized anxiety disorder) 06/15/2015  . Fixed pupils 08/31/2015  . Depression 08/31/2015  . Cervical disc disorder with radiculopathy of cervical region 11/11/2015  . Primary osteoarthritis of first carpometacarpal joint of left hand 04/04/2016  . Localized primary carpometacarpal osteoarthritis, left 04/04/2016  . Palpitations 04/14/2016  . Balance problems 07/19/2016  . Dizziness 07/19/2016  . Pes anserinus bursitis of right knee 09/05/2016  . Trigger thumb, left thumb 10/06/2016   Resolved Ambulatory Problems    Diagnosis Date Noted  . RHINITIS 10/13/2009  . Polyuria 11/04/2010  . Degenerative disc disease,  cervical 10/31/2013  . Cervical disc disorder with radiculopathy of cervical region 11/29/2013  . Itching 11/29/2013  . Radiculitis of left cervical region 01/17/2014  . Nausea with vomiting 11/25/2014  . Dysmenorrhea 05/11/2015  . Dysuria 08/31/2015  . Acute upper respiratory infection 04/04/2016   Past Medical History:  Diagnosis Date  . Anxiety   . Asthma   . Complication of anesthesia   . Dysrhythmia   . Elevated heart rate and blood pressure   . Fibromyalgia   . Headache(784.0)   . Hypertension   . IBS (irritable bowel syndrome)   . IC (interstitial cystitis)   . Insomnia   . PONV (postoperative nausea and vomiting)       Review of Systems  All other systems reviewed and are negative.      Objective:   Physical Exam  Constitutional: She is oriented to person, place, and time. She appears well-developed and well-nourished.  HENT:  Head: Normocephalic and atraumatic.  Eyes: Conjunctivae are normal.  Neck:  Pt is not able to have full ROM due to pain in her neck.   Cardiovascular: Normal rate, regular rhythm and normal heart sounds.   Pulmonary/Chest: Effort normal and breath sounds normal.  Neurological: She is alert and oriented to person, place, and time.  Psychiatric: Her behavior is normal.  Very tearful and frustrated.           Assessment & Plan:  .Marland KitchenPrinces was seen today for medication management.  Diagnoses and all orders for this visit:  Cervical disc disorder with radiculopathy of cervical region -     ketorolac (TORADOL) 10 MG tablet;  Take 1 tablet (10 mg total) by mouth every 6 (six) hours as needed. -     pregabalin (LYRICA) 150 MG capsule; Take 1 capsule (150 mg total) by mouth 2 (two) times daily. -     carisoprodol (SOMA) 250 MG tablet; Take 1 tablet (250 mg total) by mouth 3 (three) times daily. As needed. -     diclofenac sodium (VOLTAREN) 1 % GEL; Apply 4 g topically 4 (four) times daily.  GAD (generalized anxiety disorder) -      clonazePAM (KLONOPIN) 1 MG tablet; Take 1 tablet (1 mg total) by mouth 2 (two) times daily. -     busPIRone (BUSPAR) 10 MG tablet; Take 1 tablet (10 mg total) by mouth 3 (three) times daily.  Anxiety state -     clonazePAM (KLONOPIN) 1 MG tablet; Take 1 tablet (1 mg total) by mouth 2 (two) times daily. -     busPIRone (BUSPAR) 10 MG tablet; Take 1 tablet (10 mg total) by mouth 3 (three) times daily.  Moderate episode of recurrent major depressive disorder (HCC) -     DULoxetine (CYMBALTA) 60 MG capsule; Take 1 capsule (60 mg total) by mouth daily. -     lamoTRIgine (LAMICTAL) 100 MG tablet; Take 1 tablet (100 mg total) by mouth daily.  Myofascial pain syndrome -     DULoxetine (CYMBALTA) 60 MG capsule; Take 1 capsule (60 mg total) by mouth daily. -     ketorolac (TORADOL) 10 MG tablet; Take 1 tablet (10 mg total) by mouth every 6 (six) hours as needed. -     pregabalin (LYRICA) 150 MG capsule; Take 1 capsule (150 mg total) by mouth 2 (two) times daily. -     carisoprodol (SOMA) 250 MG tablet; Take 1 tablet (250 mg total) by mouth 3 (three) times daily. As needed. -     diclofenac sodium (VOLTAREN) 1 % GEL; Apply 4 g topically 4 (four) times daily.  S/P cervical spinal fusion -     ketorolac (TORADOL) 10 MG tablet; Take 1 tablet (10 mg total) by mouth every 6 (six) hours as needed. -     pregabalin (LYRICA) 150 MG capsule; Take 1 capsule (150 mg total) by mouth 2 (two) times daily. -     carisoprodol (SOMA) 250 MG tablet; Take 1 tablet (250 mg total) by mouth 3 (three) times daily. As needed. -     diclofenac sodium (VOLTAREN) 1 % GEL; Apply 4 g topically 4 (four) times daily.  Recurrent major depressive disorder, remission status unspecified ([place])   Pt continues to complain of pain. I suggested she needed to consider pain clinic. She has tried numerous drugs, surgeries and injections. I think she would be a great candidate.

## 2016-12-22 ENCOUNTER — Telehealth: Payer: Self-pay

## 2016-12-22 NOTE — Telephone Encounter (Signed)
Pre Authorization was Approved by medicaid on 12/22/2016 for Lyrica. PA# [id number] Ref # [id number]. Pt informed. Pt stated that she wanted to let Iran Planas PA-C know that she has an appt with pain management and that they may be calling her.

## 2016-12-23 ENCOUNTER — Telehealth: Payer: Self-pay

## 2016-12-23 ENCOUNTER — Encounter: Payer: Self-pay | Admitting: Physician Assistant

## 2016-12-23 NOTE — Telephone Encounter (Signed)
Pre Authorization was done today for Carisoprodol. Insurance stated for the office to call back after 24 hours for decision. PA # [id number]. Ref # [id number]

## 2016-12-30 ENCOUNTER — Other Ambulatory Visit: Payer: Self-pay | Admitting: Physician Assistant

## 2017-01-04 ENCOUNTER — Ambulatory Visit (INDEPENDENT_AMBULATORY_CARE_PROVIDER_SITE_OTHER): Payer: Self-pay | Admitting: Family Medicine

## 2017-01-04 ENCOUNTER — Encounter: Payer: Self-pay | Admitting: Family Medicine

## 2017-01-04 ENCOUNTER — Telehealth: Payer: Self-pay | Admitting: *Deleted

## 2017-01-04 ENCOUNTER — Encounter: Payer: Self-pay | Admitting: Physician Assistant

## 2017-01-04 VITALS — BP 134/85 | HR 107 | Wt 136.0 lb

## 2017-01-04 DIAGNOSIS — N760 Acute vaginitis: Secondary | ICD-10-CM

## 2017-01-04 DIAGNOSIS — J069 Acute upper respiratory infection, unspecified: Secondary | ICD-10-CM

## 2017-01-04 LAB — WET PREP FOR TRICH, YEAST, CLUE
MICRO NUMBER:: 80972759
SPECIMEN QUALITY 3963: ADEQUATE

## 2017-01-04 MED ORDER — LIDOCAINE 2 % EX GEL: 1.0000 "application " | Freq: Three times a day (TID) | CUTANEOUS | 1 refills | Status: DC | PRN

## 2017-01-04 NOTE — Telephone Encounter (Signed)
Pt was seen today and informed me that her Manuela Neptune is not covered by her insurance. She stated that she called to inform our office of this last week. Also she has had issue with her voltaran. I informed her that I will fwd this to her pcp.Audelia Hives [place]

## 2017-01-04 NOTE — Addendum Note (Signed)
Addended by: Teddy Spike on: 01/04/2017 01:50 PM   Modules accepted: Orders

## 2017-01-04 NOTE — Progress Notes (Signed)
Subjective:    Patient ID: [name], female    DOB: 05-12-1975, 41 y.o.   MRN: [id number]  HPI   She is worried she has some new genital lesions. Pt reports that she has been tested 2x in the past and both tests were negative she stated that this past weekend she had relations with her new BF and he was very large and now she is in a lot of pain and she has some ulcerations in her vaginal area. she sees Dr. Ronita Hipps last pap was 2016 it was neg. She says pain is now constant.  Says it is itchy and burns at the same time.  She did use a condom.    She also completed a headache Raynaud's and cough or proximally 2 days. She's been mostly taking Mucinex. She says the cough is causing her chest hurt a little bit. To the point where she's a most dry heaving. No fevers or chills. Her son has been sick as well.  Review of Systems   BP 134/85   Pulse (!) 107   Wt 136 lb (61.7 kg)   LMP 05/10/2015   SpO2 100%   BMI 24.09 kg/m     Allergies  Allergen Reactions  . Gadolinium Derivatives Hives, Itching and Other (See Comments)    After MRI with Multihance finished, patient had redness and itching on chest, tightness in throat.  Patient went to ED to be monitored (04/23/14).  . Prednisone Other (See Comments)    Pain all over, "it triggers my myofascial pain syndrome"   . Amitriptyline Other (See Comments)    Gained weight and does not want to be on.   . Eggs Or Egg-Derived Products Rash  . Hydrocodone-Acetaminophen Itching    Tolerates with benadryl (no reaction to oxycodone)  . Lunesta [Eszopiclone] Other (See Comments)    Metallic taste in mouth  . Tramadol Itching    Past Medical History:  Diagnosis Date  . Anxiety   . Asthma   . Complication of anesthesia   . Dysrhythmia    tachycardia  . Elevated heart rate and blood pressure    takes toprol for increased heart rate  . Fibromyalgia   . Headache(784.0)   . Hypertension    no longer takes meds for HTN  . IBS  (irritable bowel syndrome)    questionable, per pt; saw Dr. Collene Mares  . IC (interstitial cystitis)   . Insomnia   . PONV (postoperative nausea and vomiting)     Past Surgical History:  Procedure Laterality Date  . CARPAL TUNNEL RELEASE Left 06/05/2014   Procedure: LEFT CARPAL TUNNEL RELEASE;  Surgeon: Daryll Brod, MD;  Location: [place];  Service: Orthopedics;  Laterality: Left;  . CARPAL TUNNEL RELEASE Right 08/25/2014   Procedure: RIGHT CARPAL TUNNEL RELEASE;  Surgeon: Daryll Brod, MD;  Location: [place];  Service: Orthopedics;  Laterality: Right;  . CARPAL TUNNEL RELEASE Left 07/14/2015   Procedure: LEFT CARPAL TUNNEL RELEASE;  Surgeon: Daryll Brod, MD;  Location: [place];  Service: Orthopedics;  Laterality: Left;  . CERVICAL SPINE SURGERY  8/15  . CESAREAN SECTION    . DILATION AND EVACUATION N/A 05/07/2013   Procedure: DILATATION AND EVACUATION with tissue sent for chromosome analysis;  Surgeon: Lovenia Kim, MD;  Location: [facility];  Service: Gynecology;  Laterality: N/A;  . EPIDURAL STEROID INJECTION  03/20/15  . OVARIAN CYST REMOVAL    . PELVIC LAPAROSCOPY  2003  endometriosis  . POSTERIOR CERVICAL FUSION/FORAMINOTOMY N/A 10/28/2014   Procedure: Cervical Four-Cervical Seven Posterior cervical fusion with lateral mass fixation;  Surgeon: Eustace Moore, MD;  Location: [place] [facility];  Service: Neurosurgery;  Laterality: N/A;  posterior  . ROBOTIC ASSISTED LAPAROSCOPIC LYSIS OF ADHESION N/A 07/18/2014   Procedure: ROBOTIC ASSISTED LAPAROSCOPIC  EXCISION POSTERIOR  UTERINE WALL MASS; EXCISION RIGHT MESSALEINGEAL MASS, EXCISION AND ABLATION CULDASAC ENDOMETRIOSIS;  Surgeon: Brien Few, MD;  Location: [facility];  Service: Gynecology;  Laterality: N/A;  . ROBOTIC ASSISTED TOTAL HYSTERECTOMY WITH SALPINGECTOMY Bilateral 05/11/2015   Procedure: ROBOTIC ASSISTED TOTAL HYSTERECTOMY WITH BILATERAL SALPINGECTOMY;  Surgeon: Brien Few, MD;  Location:  [facility];  Service: Gynecology;  Laterality: Bilateral;    Social History   Social History  . Marital status: Divorced    Spouse name: N/A  . Number of children: 2  . Years of education: N/A   Occupational History  . 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 Smoking status: Current Some Day Smoker    Packs/day: 0.50    Years: 18.00    Types: Cigarettes  . Smokeless tobacco: Never Used  . Alcohol use No  . Drug use: No  . Sexual activity: Yes    Birth control/ protection: Surgical   Other Topics Concern  . Not on file   Social History Narrative   Going through separation.  Children Sage and McClure    Family History  Problem Relation Age of Onset  . Heart attack Father   . ADD / ADHD Brother   . Depression Brother   . Depression Cousin     Outpatient Encounter Prescriptions as of 01/04/2017  Medication Sig  . busPIRone (BUSPAR) 10 MG tablet Take 1 tablet (10 mg total) by mouth 3 (three) times daily.  . clonazePAM (KLONOPIN) 1 MG tablet Take 1 tablet (1 mg total) by mouth 2 (two) times daily.  . DULoxetine (CYMBALTA) 60 MG capsule Take 1 capsule (60 mg total) by mouth daily.  [name] ketorolac (TORADOL) 10 MG tablet Take 1 tablet (10 mg total) by mouth every 6 (six) hours as needed.  . lamoTRIgine (LAMICTAL) 100 MG tablet Take 1 tablet (100 mg total) by mouth daily.  . metoprolol succinate (TOPROL-XL) 100 MG 24 hr tablet TAKE 1 TABLET (100 MG TOTAL) BY MOUTH DAILY. TAKE WITH OR IMMEDIATELY FOLLOWING A MEAL. (Patient taking differently: Take 50 mg by mouth daily. Take with or immediately following a meal.)  . pentosan polysulfate (ELMIRON) 100 MG capsule Take 1 capsule (100 mg total) by mouth 3 (three) times daily as needed.  . pregabalin (LYRICA) 150 MG capsule Take 1 capsule (150 mg total) by mouth 2 (two) times daily.  . SYMBICORT 160-4.5 MCG/ACT inhaler INHALE 2 PUFFS INTO THE LUNGS 2 (TWO) TIMES DAILY.  [name] zolpidem (AMBIEN) 10 MG tablet Take 1  tablet (10 mg total) by mouth at bedtime as needed. for sleep  . carisoprodol (SOMA) 250 MG tablet Take 1 tablet (250 mg total) by mouth 3 (three) times daily. As needed. (Patient not taking: Reported on 01/04/2017)  . diclofenac sodium (VOLTAREN) 1 % GEL Apply 4 g topically 4 (four) times daily. (Patient not taking: Reported on 01/04/2017)  . Lidocaine 2 % GEL Apply 1 application topically 3 (three) times daily as needed.   No facility-administered encounter medications on file as of 01/04/2017.           Objective:   Physical Exam  Constitutional: She is oriented to person, place, and time. She appears well-developed and well-nourished.  HENT:  Head: Normocephalic and atraumatic.  Eyes: Conjunctivae and EOM are normal.  Cardiovascular: Normal rate.   Pulmonary/Chest: Effort normal.  Genitourinary:    There is no rash on the right labia. There is tenderness in the vagina. No erythema or bleeding in the vagina. Vaginal discharge found.  Genitourinary Comments: External vagina with some mild swelling and white discharge consistent with lochia. Tender in that area as well. No ulcerations noted. No speculum exam performed as patient was extremely tender.  Neurological: She is alert and oriented to person, place, and time.  Skin: Skin is dry. No pallor.  Psychiatric: She has a normal mood and affect. Her behavior is normal.  Vitals reviewed.       Assessment & Plan:  Perineal tear - recommend trial of lidocaine.   Vaginitis. Will check for wet prep and vaginitis.  Will call with results once available. I wonder she could have a yeast infection which should be, could be causing some of her discomfort as well as swelling.  URI- likely viral. Call if not significantly better after the weekend. Recommend symptomatic care. Recommend a trial of zinc lozenges to reduce the duration of the illness.

## 2017-01-05 ENCOUNTER — Telehealth: Payer: Self-pay | Admitting: Physician Assistant

## 2017-01-05 NOTE — Telephone Encounter (Signed)
Received fax from [facility] and Carisoprodol 250 was denied due to patient has to try and fail 2 preferred agents from the class. I am placing in providers box. - CF

## 2017-01-06 ENCOUNTER — Encounter: Payer: Self-pay | Admitting: *Deleted

## 2017-01-06 ENCOUNTER — Encounter: Payer: Self-pay | Admitting: Physician Assistant

## 2017-01-06 ENCOUNTER — Ambulatory Visit (INDEPENDENT_AMBULATORY_CARE_PROVIDER_SITE_OTHER): Payer: Medicaid Other | Admitting: Physician Assistant

## 2017-01-06 VITALS — BP 126/84 | HR 98 | Temp 98.0°F | Wt 136.0 lb

## 2017-01-06 DIAGNOSIS — H6502 Acute serous otitis media, left ear: Secondary | ICD-10-CM

## 2017-01-06 DIAGNOSIS — N760 Acute vaginitis: Secondary | ICD-10-CM

## 2017-01-06 DIAGNOSIS — Z79899 Other long term (current) drug therapy: Secondary | ICD-10-CM

## 2017-01-06 DIAGNOSIS — M7918 Myalgia, other site: Secondary | ICD-10-CM

## 2017-01-06 DIAGNOSIS — M791 Myalgia: Secondary | ICD-10-CM | POA: Diagnosis not present

## 2017-01-06 DIAGNOSIS — G894 Chronic pain syndrome: Secondary | ICD-10-CM | POA: Diagnosis not present

## 2017-01-06 DIAGNOSIS — M5010 Cervical disc disorder with radiculopathy, unspecified cervical region: Secondary | ICD-10-CM

## 2017-01-06 LAB — HERPES SIMPLEX VIRUS CULTURE
MICRO NUMBER:: 80973044
SPECIMEN QUALITY: ADEQUATE

## 2017-01-06 MED ORDER — FLUCONAZOLE 150 MG PO TABS: 150.0000 mg | ORAL_TABLET | Freq: Once | ORAL | 0 refills | Status: AC

## 2017-01-06 MED ORDER — OXYCODONE-ACETAMINOPHEN 5-325 MG PO TABS: ORAL_TABLET | ORAL | 0 refills | Status: DC

## 2017-01-06 MED ORDER — AZITHROMYCIN 250 MG PO TABS: ORAL_TABLET | ORAL | 0 refills | Status: DC

## 2017-01-06 MED ORDER — CARISOPRODOL 350 MG PO TABS: 350.0000 mg | ORAL_TABLET | Freq: Three times a day (TID) | ORAL | 1 refills | Status: DC | PRN

## 2017-01-06 NOTE — Progress Notes (Signed)
Subjective:    Patient ID: [name], female    DOB: [birth year], 41 y.o.   MRN: [id number]  HPI  Pt is a 41 yo female who presents to the clinic for multiple concerns.   She was seen on Monday this week by Dr. Madilyn Fireman and her URI symptoms have worsened. She continues to have sinus pressure, cough, ST but now she has a lot of left ear pain. She tried the zinc, mucinex, and nyquil. Cough is really bad at night.   She continues to have some vaginal itching and "feeling swollen". Her wet prep results are not back due to lab error and changes.   Pt continues to have cervical radiculopathy. Her pain management doctor will not prescribe as needed narcotic for pain.  Pt has maximized cymbalta. If we increase she starts to have side effects.  Gabapentin did not seem to work she was at 900mg .  lyrica she stayed on for a while but started gaining weight and did not feel any better.  TCA caused dry mouth and weight gain. NSAId not helping much. On toradol. PT twice and failed response. She cannot tolerate massages due to pain.  somo does help.  She has had 2 surgeries and multiple spinal injections with some benefit.  Pt pleads with something as needed to help with pain.         Review of Systems    see HPI.  Objective:   Physical Exam  Constitutional: She is oriented to person, place, and time. She appears well-developed and well-nourished.  HENT:  Head: Normocephalic and atraumatic.  Right Ear: External ear normal.  Nose: Nose normal.  Mouth/Throat: No oropharyngeal exudate.  Left TM bulging with erythema. Ossicles not able to be seen.  Oropharynx erythematous without exudate.   Eyes: Conjunctivae are normal. Right eye exhibits no discharge. Left eye exhibits no discharge.  Neck: Normal range of motion. Neck supple.  Cardiovascular: Normal rate, regular rhythm and normal heart sounds.   Pulmonary/Chest: Effort normal and breath sounds normal. She has no wheezes.   Lymphadenopathy:    She has cervical adenopathy.  Neurological: She is alert and oriented to person, place, and time.  Psychiatric: She has a normal mood and affect. Her behavior is normal.          Assessment & Plan:  .[name] was seen today for left ear pain.  Diagnoses and all orders for this visit:  Acute serous otitis media of left ear, recurrence not specified -     azithromycin (ZITHROMAX) 250 MG tablet; Take 2 tablets now and the one tablet for 4 days.  Controlled substance agreement signed -     Pain Mgmt, Profile 6 Conf w/o mM, U  Cervical disc disorder with radiculopathy of cervical region -     carisoprodol (SOMA) 350 MG tablet; Take 1 tablet (350 mg total) by mouth 3 (three) times daily as needed for muscle spasms. -     oxyCODONE-acetaminophen (PERCOCET/ROXICET) 5-325 MG tablet; One tablet every 12 hours as needed for moderate/severe pain.  Myofascial pain syndrome -     carisoprodol (SOMA) 350 MG tablet; Take 1 tablet (350 mg total) by mouth 3 (three) times daily as needed for muscle spasms.  Acute vaginitis -     fluconazole (DIFLUCAN) 150 MG tablet; Take 1 tablet (150 mg total) by mouth once. Repeat in 48-72 hours.   Sent zpak for ear infection. Other symptomatic care discussed.   Diflucan sent since wet prep has not  resulted yet and symptoms could be consisent with yeast. Will follow up after receive results.   Insurance will not pay for somo but good rx will. Printed rx.   Chronic pain:  Pt has maximized cymbalta. If we increase she starts to have side effects.  Gabapentin did not seem to work she was at 900mg .  lyrica she stayed on for a while but started gaining weight and did not feel any better.  TCA caused dry mouth and weight gain. NSAId not helping much. On toradol. PT twice and failed response. She cannot tolerate massages due to pain.  somo does help.  She has had 2 surgeries and multiple spinal injections with some benefit.  Pt pleads  with something as needed to help with pain.   I agreed to give her 30 tablets to last AT least 2 months. She is only to use as needed.  Pain contract signed. [place] controlled substance database reviewed and has not filled any opioids in over a year.  Discussed NOT to use with klonapin due to risk of sudden death increase.  UDS done today.  ..     Opioid Risk Tool - 01/08/17 2148      Family History of Substance Abuse   Alcohol Negative   Illegal Drugs Negative   Rx Drugs Negative     Personal History of Substance Abuse   Alcohol Negative   Illegal Drugs Negative   Rx Drugs Negative     Age   Age between 68-45 years  Yes     History of Preadolescent Sexual Abuse   History of Preadolescent Sexual Abuse Negative or Female     Psychological Disease   Depression Positive     Total Score   Opioid Risk Tool Scoring 2   Opioid Risk Interpretation Low Risk      .. follow up in 2 months.

## 2017-01-06 NOTE — Patient Instructions (Signed)

## 2017-01-06 NOTE — Telephone Encounter (Signed)
Managed via office visit.

## 2017-01-08 ENCOUNTER — Encounter: Payer: Self-pay | Admitting: Physician Assistant

## 2017-01-08 DIAGNOSIS — G894 Chronic pain syndrome: Secondary | ICD-10-CM | POA: Insufficient documentation

## 2017-01-10 LAB — PAIN MGMT, PROFILE 6 CONF W/O MM, U
6 ACETYLMORPHINE: NEGATIVE ng/mL (ref <10)
ALPHAHYDROXYALPRAZOLAM: NEGATIVE ng/mL (ref <25)
ALPHAHYDROXYMIDAZOLAM: NEGATIVE ng/mL (ref <50)
AMPHETAMINES: NEGATIVE ng/mL (ref <500)
Alcohol Metabolites: NEGATIVE ng/mL (ref <500)
Alphahydroxytriazolam: NEGATIVE ng/mL (ref <50)
Aminoclonazepam: 436 ng/mL — ABNORMAL HIGH (ref <25)
BARBITURATES: NEGATIVE ng/mL (ref <300)
BENZODIAZEPINES: POSITIVE ng/mL — AB (ref <100)
CREATININE: 136.7 mg/dL
Cocaine Metabolite: NEGATIVE ng/mL (ref <150)
Hydroxyethylflurazepam: NEGATIVE ng/mL (ref <50)
Lorazepam: NEGATIVE ng/mL (ref <50)
MARIJUANA METABOLITE: NEGATIVE ng/mL (ref <20)
METHADONE METABOLITE: NEGATIVE ng/mL (ref <100)
Nordiazepam: NEGATIVE ng/mL (ref <50)
OXAZEPAM: NEGATIVE ng/mL (ref <50)
OXIDANT: NEGATIVE ug/mL (ref <200)
Opiates: NEGATIVE ng/mL (ref <100)
Oxycodone: NEGATIVE ng/mL (ref <100)
PHENCYCLIDINE: NEGATIVE ng/mL (ref <25)
TEMAZEPAM: NEGATIVE ng/mL (ref <50)
pH: 7.18 (ref 4.5–9.0)

## 2017-01-11 ENCOUNTER — Ambulatory Visit: Payer: Self-pay | Admitting: Sports Medicine

## 2017-01-11 DIAGNOSIS — Z0189 Encounter for other specified special examinations: Secondary | ICD-10-CM

## 2017-01-22 ENCOUNTER — Encounter: Payer: Self-pay | Admitting: Family Medicine

## 2017-01-22 DIAGNOSIS — A6000 Herpesviral infection of urogenital system, unspecified: Secondary | ICD-10-CM | POA: Insufficient documentation

## 2017-01-23 ENCOUNTER — Other Ambulatory Visit: Payer: Self-pay | Admitting: Family Medicine

## 2017-01-23 DIAGNOSIS — B009 Herpesviral infection, unspecified: Secondary | ICD-10-CM

## 2017-01-23 MED ORDER — VALACYCLOVIR HCL 1 G PO TABS: 1000.0000 mg | ORAL_TABLET | Freq: Every day | ORAL | 3 refills | Status: DC

## 2017-01-23 NOTE — Progress Notes (Signed)
Rx sent to pharmacy   

## 2017-01-29 ENCOUNTER — Other Ambulatory Visit: Payer: Self-pay | Admitting: Physician Assistant

## 2017-01-29 DIAGNOSIS — R05 Cough: Secondary | ICD-10-CM

## 2017-01-29 DIAGNOSIS — R059 Cough, unspecified: Secondary | ICD-10-CM

## 2017-02-07 ENCOUNTER — Other Ambulatory Visit: Payer: Self-pay | Admitting: Physician Assistant

## 2017-02-07 DIAGNOSIS — R002 Palpitations: Secondary | ICD-10-CM

## 2017-03-08 ENCOUNTER — Ambulatory Visit (INDEPENDENT_AMBULATORY_CARE_PROVIDER_SITE_OTHER): Payer: Medicaid Other | Admitting: Physician Assistant

## 2017-03-08 ENCOUNTER — Encounter: Payer: Self-pay | Admitting: Physician Assistant

## 2017-03-08 VITALS — BP 130/67 | HR 84 | Ht 63.0 in | Wt 142.0 lb

## 2017-03-08 DIAGNOSIS — F331 Major depressive disorder, recurrent, moderate: Secondary | ICD-10-CM

## 2017-03-08 DIAGNOSIS — M7918 Myalgia, other site: Secondary | ICD-10-CM | POA: Diagnosis not present

## 2017-03-08 DIAGNOSIS — G894 Chronic pain syndrome: Secondary | ICD-10-CM | POA: Diagnosis not present

## 2017-03-08 DIAGNOSIS — M5010 Cervical disc disorder with radiculopathy, unspecified cervical region: Secondary | ICD-10-CM | POA: Diagnosis not present

## 2017-03-08 DIAGNOSIS — R002 Palpitations: Secondary | ICD-10-CM | POA: Diagnosis not present

## 2017-03-08 DIAGNOSIS — Z981 Arthrodesis status: Secondary | ICD-10-CM | POA: Diagnosis not present

## 2017-03-08 MED ORDER — LAMOTRIGINE 100 MG PO TABS: 100.0000 mg | ORAL_TABLET | Freq: Every day | ORAL | 5 refills | Status: DC

## 2017-03-08 MED ORDER — METOPROLOL SUCCINATE ER 100 MG PO TB24: 100.0000 mg | ORAL_TABLET | Freq: Every day | ORAL | 3 refills | Status: DC

## 2017-03-08 MED ORDER — CARISOPRODOL 350 MG PO TABS: 350.0000 mg | ORAL_TABLET | Freq: Two times a day (BID) | ORAL | 5 refills | Status: DC | PRN

## 2017-03-08 MED ORDER — DULOXETINE HCL 60 MG PO CPEP: 60.0000 mg | ORAL_CAPSULE | Freq: Every day | ORAL | 3 refills | Status: DC

## 2017-03-08 MED ORDER — OXYCODONE-ACETAMINOPHEN 5-325 MG PO TABS: ORAL_TABLET | ORAL | 0 refills | Status: DC

## 2017-03-08 MED ORDER — DICLOFENAC SODIUM 1 % TD GEL
4.0000 g | Freq: Four times a day (QID) | TRANSDERMAL | 5 refills | Status: DC
Start: 2017-03-08 — End: 2017-04-07

## 2017-03-08 NOTE — Progress Notes (Addendum)
Subjective:    Patient ID: [name], female    DOB: 24-Nov-1975, 41 y.o.   MRN: [id number]  HPI  Pt is a 54 you female who presents to the clinic for chronic pain management. I give her 30 tablets of oxycodone to use as needed for moderate to severe pain in combination with current treatment. She uses 30 tabs in about 2 months.   Pt has maximized cymbalta. If we increase she starts to have side effects.  Gabapentin did not seem to work she was at 900mg .  lyrica she stayed on for a while but started gaining weight and did not feel any better.  TCA caused dry mouth and weight gain. NSAId not helping much. On toradol for breakthrough pain. voltaren gel does help some.  PT twice and failed response. She cannot tolerate massages due to pain.  somo bid does help.  She has had 2 surgeries and multiple spinal injections with some benefit.  She continues to go to get ESI injections to see more pain relief.   She is using klonapin very sparingly and never the same day as oxycodone.   Depression- managed well. Taking medication. No concerns.   Palpitations- managed with metoprolol.   .. Active Ambulatory Problems    Diagnosis Date Noted  . Anxiety state 07/06/2007  . Smoker 02/05/2010  . Depressed 06/26/2008  . INTERSTITIAL CYSTITIS 02/05/2010  . Insomnia 07/06/2007  . FATIGUE 07/02/2009  . HEADACHE 07/06/2007  . TROCHANTERIC BURSITIS 06/21/2010  . Tick-borne disease 08/09/2010  . PTSD (post-traumatic stress disorder) 12/14/2012  . Abnormal weight gain 11/29/2013  . Greater trochanteric bursitis 11/29/2013  . Constipation due to opioid therapy 01/17/2014  . Left shoulder pain 04/02/2014  . Carpal tunnel syndrome of left wrist 05/05/2014  . Myofascial pain syndrome 05/29/2014  . Paresthesia 06/03/2014  . Endometriosis 07/20/2014  . Tachycardia 08/13/2014  . S/P cervical spinal fusion 10/28/2014  . Therapeutic opioid induced constipation 12/16/2014  . Vasomotor  flushing 06/15/2015  . GAD (generalized anxiety disorder) 06/15/2015  . Fixed pupils 08/31/2015  . Depression 08/31/2015  . Cervical disc disorder with radiculopathy of cervical region 11/11/2015  . Primary osteoarthritis of first carpometacarpal joint of left hand 04/04/2016  . Localized primary carpometacarpal osteoarthritis, left 04/04/2016  . Palpitations 04/14/2016  . Balance problems 07/19/2016  . Dizziness 07/19/2016  . Pes anserinus bursitis of right knee 09/05/2016  . Trigger thumb, left thumb 10/06/2016  . Chronic pain syndrome 01/08/2017  . 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   Resolved Ambulatory Problems    Diagnosis Date Noted  . RHINITIS 10/13/2009  . Polyuria 11/04/2010  . Degenerative disc disease, cervical 10/31/2013  . Cervical disc disorder with radiculopathy of cervical region 11/29/2013  . Itching 11/29/2013  . Radiculitis of left cervical region 01/17/2014  . Nausea with vomiting 11/25/2014  . Dysmenorrhea 05/11/2015  . Dysuria 08/31/2015  . Acute upper respiratory infection 04/04/2016   Past Medical History:  Diagnosis Date  . Anxiety   . Asthma   . Complication of anesthesia   . Dysrhythmia   . Elevated heart rate and blood pressure   . Fibromyalgia   . Headache(784.0)   . Hypertension   . IBS (irritable bowel syndrome)   . IC (interstitial cystitis)   . Insomnia   . PONV (postoperative nausea and vomiting)       Review of Systems  All other systems reviewed and are negative.      Objective:   Physical Exam  Constitutional: She  is oriented to person, place, and time. She appears well-developed and well-nourished.  HENT:  Head: Normocephalic and atraumatic.  Cardiovascular: Normal rate, regular rhythm and normal heart sounds.  Pulmonary/Chest: Effort normal and breath sounds normal.  Neurological: She is alert and oriented to person, place, and time.  Psychiatric: She has a normal mood and affect. Her behavior is normal.          Assessment  & Plan:  Marland KitchenMarland KitchenDiagnoses and all orders for this visit:  Chronic pain syndrome -     oxyCODONE-acetaminophen (PERCOCET/ROXICET) 5-325 MG tablet; One tablet every 12 hours as needed for moderate/severe pain. -     carisoprodol (SOMA) 350 MG tablet; Take 1 tablet (350 mg total) 2 (two) times daily as needed by mouth for muscle spasms. -     DULoxetine (CYMBALTA) 60 MG capsule; Take 1 capsule (60 mg total) daily by mouth. -     diclofenac sodium (VOLTAREN) 1 % GEL; Apply 4 g 4 (four) times daily topically.  Cervical disc disorder with radiculopathy of cervical region -     oxyCODONE-acetaminophen (PERCOCET/ROXICET) 5-325 MG tablet; One tablet every 12 hours as needed for moderate/severe pain. -     carisoprodol (SOMA) 350 MG tablet; Take 1 tablet (350 mg total) 2 (two) times daily as needed by mouth for muscle spasms. -     diclofenac sodium (VOLTAREN) 1 % GEL; Apply 4 g 4 (four) times daily topically.  Myofascial pain syndrome -     carisoprodol (SOMA) 350 MG tablet; Take 1 tablet (350 mg total) 2 (two) times daily as needed by mouth for muscle spasms. -     DULoxetine (CYMBALTA) 60 MG capsule; Take 1 capsule (60 mg total) daily by mouth. -     diclofenac sodium (VOLTAREN) 1 % GEL; Apply 4 g 4 (four) times daily topically.  Moderate episode of recurrent major depressive disorder (HCC) -     DULoxetine (CYMBALTA) 60 MG capsule; Take 1 capsule (60 mg total) daily by mouth. -     lamoTRIgine (LAMICTAL) 100 MG tablet; Take 1 tablet (100 mg total) daily by mouth.  Palpitations -     metoprolol succinate (TOPROL-XL) 100 MG 24 hr tablet; Take 1 tablet (100 mg total) daily by mouth. Take with or immediately following a meal.  S/P cervical spinal fusion -     diclofenac sodium (VOLTAREN) 1 % GEL; Apply 4 g 4 (four) times daily topically.   .. Depression screen [facility] 2/9 03/08/2017 07/18/2016  Decreased Interest 2 3  Down, Depressed, Hopeless 1 2  PHQ - 2 Score 3 5  Altered sleeping 3 2  Tired,  decreased energy 1 2  Change in appetite 1 2  Feeling bad or failure about yourself  1 2  Trouble concentrating 1 3  Moving slowly or fidgety/restless 0 3  Suicidal thoughts 0 0  PHQ-9 Score 10 19  Some recent data might be hidden    [place] controlled substance database reviewed with last refill of opiate on 01/06/17. Everything appears appropriate. Given script for #30  Continue with treatment plan as above for pain. Follow up in 3 months.   Refilled other medication for controlled conditions.   Pt comes in next day to see another provider but remembers she wanted to tell me about hand swelling and ankle swelling off and on. Discussed decreasing salt in diet. Will give HCTZ as needed.

## 2017-03-09 ENCOUNTER — Encounter: Payer: Self-pay | Admitting: Physician Assistant

## 2017-03-09 ENCOUNTER — Ambulatory Visit (INDEPENDENT_AMBULATORY_CARE_PROVIDER_SITE_OTHER): Payer: Medicaid Other | Admitting: Sports Medicine

## 2017-03-09 ENCOUNTER — Telehealth: Payer: Self-pay

## 2017-03-09 DIAGNOSIS — M19042 Primary osteoarthritis, left hand: Secondary | ICD-10-CM

## 2017-03-09 MED ORDER — HYDROCHLOROTHIAZIDE 12.5 MG PO TABS: 12.5000 mg | ORAL_TABLET | Freq: Every day | ORAL | 2 refills | Status: DC

## 2017-03-09 NOTE — Addendum Note (Signed)
Addended by: Donella Stade on: 03/09/2017 04:34 PM   Modules accepted: Orders

## 2017-03-09 NOTE — Assessment & Plan Note (Signed)
Injection as above, return as needed

## 2017-03-09 NOTE — Telephone Encounter (Signed)
Recv'd PA from [facility] for Oxycodone-Acetaminophen 5-325mg . Put in Andrea's box.

## 2017-03-09 NOTE — Progress Notes (Signed)
  Subjective:    CC: Left hand pain  HPI: This is a pleasant 41 year old female with known hand osteoarthritis, she had an injection into her left first metacarpophalangeal joint many many months ago, having recurrence of pain, moderate, persistent and localized at the first MCP, desires repeat injection.  Past medical history:  Negative.  See flowsheet/record as well for more information.  Surgical history: Negative.  See flowsheet/record as well for more information.  Family history: Negative.  See flowsheet/record as well for more information.  Social history: Negative.  See flowsheet/record as well for more information.  Allergies, and medications have been entered into the medical record, reviewed, and no changes needed.   Review of Systems: No fevers, chills, night sweats, weight loss, chest pain, or shortness of breath.   Objective:    General: Well Developed, well nourished, and in no acute distress.  Neuro: Alert and oriented x3, extra-ocular muscles intact, sensation grossly intact.  HEENT: Normocephalic, atraumatic, pupils equal round reactive to light, neck supple, no masses, no lymphadenopathy, thyroid nonpalpable.  Skin: Warm and dry, no rashes. Cardiac: Regular rate and rhythm, no murmurs rubs or gallops, no lower extremity edema.  Respiratory: Clear to auscultation bilaterally. Not using accessory muscles, speaking in full sentences. Left hand: Tender to palpation at the left first MCP with palpable synovitis, only minimal tenderness at the first IP joint and the first [facility] joint.  Procedure: Real-time Ultrasound Guided Injection of left first MCP Device: GE Logiq E  Verbal informed consent obtained.  Time-out conducted.  Noted no overlying erythema, induration, or other signs of local infection.  Skin prepped in a sterile fashion.  Local anesthesia: Topical Ethyl chloride.  With sterile technique and under real time ultrasound guidance: Noted synovitis, 1/2 cc Kenalog  40, 1/2 cc lidocaine injected easily Completed without difficulty  Pain immediately resolved suggesting accurate placement of the medication.  Advised to call if fevers/chills, erythema, induration, drainage, or persistent bleeding.  Images permanently stored and available for review in the ultrasound unit.  Impression: Technically successful ultrasound guided injection.  Impression and Recommendations:    Primary osteoarthritis of left first metacarpophalangeal joint Injection as above, return as needed  ___________________________________________ Gwen Her. Dianah Field, M.D., ABFM., CAQSM. Primary Care and [place] Instructor of [employer] [facility] [employer]

## 2017-04-03 ENCOUNTER — Other Ambulatory Visit: Payer: Self-pay | Admitting: Physician Assistant

## 2017-04-03 DIAGNOSIS — F5101 Primary insomnia: Secondary | ICD-10-CM

## 2017-04-05 ENCOUNTER — Encounter: Payer: Self-pay | Admitting: Sports Medicine

## 2017-04-05 ENCOUNTER — Ambulatory Visit (INDEPENDENT_AMBULATORY_CARE_PROVIDER_SITE_OTHER): Payer: Medicaid Other | Admitting: Sports Medicine

## 2017-04-05 DIAGNOSIS — F331 Major depressive disorder, recurrent, moderate: Secondary | ICD-10-CM | POA: Diagnosis not present

## 2017-04-05 DIAGNOSIS — M7918 Myalgia, other site: Secondary | ICD-10-CM

## 2017-04-05 DIAGNOSIS — G894 Chronic pain syndrome: Secondary | ICD-10-CM | POA: Diagnosis not present

## 2017-04-05 MED ORDER — PREGABALIN 150 MG PO CAPS: 150.0000 mg | ORAL_CAPSULE | Freq: Two times a day (BID) | ORAL | 3 refills | Status: DC | PRN

## 2017-04-05 MED ORDER — DULOXETINE HCL 60 MG PO CPEP: 120.0000 mg | ORAL_CAPSULE | Freq: Every day | ORAL | 3 refills | Status: DC

## 2017-04-05 NOTE — Assessment & Plan Note (Signed)
Currently on chronic opiate therapy. Having a flare in pain, she does have good reason to have pain, she does have a C4-C7 fusion with a combination of anterior and posterior instrumentation. Increasing Cymbalta to 120 mg, adding topical Pennsaid (diclofenac 2% topical) for her neck, adding Lyrica to use as needed. Previously she was on 150 mg but stopped her daily dosing due to weight gain. Think she will be okay with intermittent dosing. Return to see me in 1 month, as she does have an epidural steroid injection for the cervical spine scheduled for a week and a half from now.

## 2017-04-05 NOTE — Progress Notes (Signed)
  Subjective:    CC: Neck pain  HPI: [name] is a 41 year old female, she has had extensive cervical fusion surgery with post anterior and posterior instrumentation, she gets occasional epidural injections from her pain provider as well as with the neurosurgeon, she is currently on 60 mg of Cymbalta, as well as oxycodone.  More recently she has been having increasing pain in her neck, right-sided with radiation to the arm with burning down to the elbow but not to the fingers.  Severe, persistent.  No weakness, no constitutional symptoms, no trauma.  Past medical history:  Negative.  See flowsheet/record as well for more information.  Surgical history: Negative.  See flowsheet/record as well for more information.  Family history: Negative.  See flowsheet/record as well for more information.  Social history: Negative.  See flowsheet/record as well for more information.  Allergies, and medications have been entered into the medical record, reviewed, and no changes needed.   Review of Systems: No fevers, chills, night sweats, weight loss, chest pain, or shortness of breath.   Objective:    General: Well Developed, well nourished, and in no acute distress.  Neuro: Alert and oriented x3, extra-ocular muscles intact, sensation grossly intact.  HEENT: Normocephalic, atraumatic, pupils equal round reactive to light, neck supple, no masses, no lymphadenopathy, thyroid nonpalpable.  Skin: Warm and dry, no rashes. Cardiac: Regular rate and rhythm, no murmurs rubs or gallops, no lower extremity edema.  Respiratory: Clear to auscultation bilaterally. Not using accessory muscles, speaking in full sentences. Neck: Negative spurling's Expected limitations in range of motion from her fusions Grip strength and sensation normal in bilateral hands Strength good C4 to T1 distribution No sensory change to C4 to T1 Reflexes normal  Impression and Recommendations:    Chronic pain syndrome Currently on  chronic opiate therapy. Having a flare in pain, she does have good reason to have pain, she does have a C4-C7 fusion with a combination of anterior and posterior instrumentation. Increasing Cymbalta to 120 mg, adding topical Pennsaid (diclofenac 2% topical) for her neck, adding Lyrica to use as needed. Previously she was on 150 mg but stopped her daily dosing due to weight gain. Think she will be okay with intermittent dosing. Return to see me in 1 month, as she does have an epidural steroid injection for the cervical spine scheduled for a week and a half from now.  I spent 25 minutes with this patient, greater than 50% was face-to-face time counseling regarding the above diagnoses ___________________________________________ Gwen Her. Dianah Field, M.D., ABFM., CAQSM. Primary Care and [place] Instructor of [place] of [facility] of Medicine

## 2017-04-06 ENCOUNTER — Telehealth: Payer: Self-pay

## 2017-04-06 MED ORDER — ONDANSETRON 8 MG PO TBDP: 8.0000 mg | ORAL_TABLET | Freq: Three times a day (TID) | ORAL | 3 refills | Status: DC | PRN

## 2017-04-06 NOTE — Telephone Encounter (Signed)
Zofran added, the nausea should pass.

## 2017-04-06 NOTE — Telephone Encounter (Signed)
Pt left VM stating she does feel better with medications but it does make her nauseous. Would like to know if she could get something for it. Please assist.

## 2017-04-07 MED ORDER — DICLOFENAC SODIUM 2 % TD SOLN: 2.0000 | Freq: Two times a day (BID) | TRANSDERMAL | 11 refills | Status: DC

## 2017-04-07 NOTE — Telephone Encounter (Signed)
Done

## 2017-04-07 NOTE — Telephone Encounter (Signed)
Pt would also like an rx for Pennsaid sent in. Please assist.

## 2017-04-07 NOTE — Addendum Note (Signed)
Addended by: Silverio Decamp on: 04/07/2017 05:16 PM   Modules accepted: Orders

## 2017-04-18 ENCOUNTER — Telehealth: Payer: Self-pay | Admitting: *Deleted

## 2017-04-18 DIAGNOSIS — M5010 Cervical disc disorder with radiculopathy, unspecified cervical region: Secondary | ICD-10-CM

## 2017-04-18 DIAGNOSIS — G894 Chronic pain syndrome: Secondary | ICD-10-CM

## 2017-04-18 MED ORDER — OXYCODONE-ACETAMINOPHEN 5-325 MG PO TABS: ORAL_TABLET | ORAL | 0 refills | Status: DC

## 2017-04-18 NOTE — Telephone Encounter (Signed)
[place] controlled substance database reviewed and last refilled 03/08/17. Refilled oxycodone #30.

## 2017-04-18 NOTE — Telephone Encounter (Signed)
Pt left vm requesting a refill of her pain medication.  Please advise.

## 2017-04-29 ENCOUNTER — Other Ambulatory Visit: Payer: Self-pay | Admitting: Physician Assistant

## 2017-04-29 DIAGNOSIS — F5101 Primary insomnia: Secondary | ICD-10-CM

## 2017-04-29 DIAGNOSIS — R059 Cough, unspecified: Secondary | ICD-10-CM

## 2017-04-29 DIAGNOSIS — R05 Cough: Secondary | ICD-10-CM

## 2017-05-03 ENCOUNTER — Ambulatory Visit: Payer: Self-pay | Admitting: Physician Assistant

## 2017-05-05 ENCOUNTER — Ambulatory Visit (INDEPENDENT_AMBULATORY_CARE_PROVIDER_SITE_OTHER): Payer: Medicaid Other

## 2017-05-05 ENCOUNTER — Ambulatory Visit (INDEPENDENT_AMBULATORY_CARE_PROVIDER_SITE_OTHER): Payer: Medicaid Other | Admitting: Physician Assistant

## 2017-05-05 VITALS — BP 131/58 | HR 100 | Ht 63.0 in | Wt 148.0 lb

## 2017-05-05 DIAGNOSIS — N898 Other specified noninflammatory disorders of vagina: Secondary | ICD-10-CM

## 2017-05-05 DIAGNOSIS — E663 Overweight: Secondary | ICD-10-CM

## 2017-05-05 DIAGNOSIS — N939 Abnormal uterine and vaginal bleeding, unspecified: Secondary | ICD-10-CM

## 2017-05-05 DIAGNOSIS — W19XXXA Unspecified fall, initial encounter: Secondary | ICD-10-CM

## 2017-05-05 DIAGNOSIS — M533 Sacrococcygeal disorders, not elsewhere classified: Secondary | ICD-10-CM | POA: Diagnosis not present

## 2017-05-05 DIAGNOSIS — B379 Candidiasis, unspecified: Secondary | ICD-10-CM | POA: Diagnosis not present

## 2017-05-05 DIAGNOSIS — G894 Chronic pain syndrome: Secondary | ICD-10-CM

## 2017-05-05 DIAGNOSIS — M5010 Cervical disc disorder with radiculopathy, unspecified cervical region: Secondary | ICD-10-CM

## 2017-05-05 DIAGNOSIS — S3992XA Unspecified injury of lower back, initial encounter: Secondary | ICD-10-CM

## 2017-05-05 MED ORDER — FLUCONAZOLE 150 MG PO TABS: 150.0000 mg | ORAL_TABLET | Freq: Once | ORAL | 0 refills | Status: AC

## 2017-05-05 MED ORDER — OXYCODONE-ACETAMINOPHEN 5-325 MG PO TABS: ORAL_TABLET | ORAL | 0 refills | Status: DC

## 2017-05-05 MED ORDER — PHENTERMINE HCL 37.5 MG PO TABS: 37.5000 mg | ORAL_TABLET | Freq: Every day | ORAL | 0 refills | Status: DC

## 2017-05-05 NOTE — Progress Notes (Signed)
Call pt: no fracture to coccyx just badly bruised. NSAIDs, ice, supportive pillow.

## 2017-05-05 NOTE — Progress Notes (Signed)
Subjective:    Patient ID: [name], female    DOB: May 06, 1975, 42 y.o.   MRN: [id number]  HPI  Pt is a 42 yo female with chronic pain  who presents to the clinic with vaginal spotting for one day and tailbone pain.   Last Thursday she noticed pink tinge when she wiped. No pain. She had hysterectomy so no reason for blood. She has not had sex. She has had vaginal discharge but thought it was just her normal since she has had for a while.   Pt feel and landed on her tailbone Saturday. It is very painful. It is hard for her to sit on it. She has used ice and taking oxycodone 1/2 tablet twice a day. She would like to take another 1/2 tablet to make it 3 times a day while tailbone heals.   Pt is very digusted with her weight. She feels like it is from [place]. She does not want to go off of it though because it helps so much. She is not currently exercising. She is trying to watch her portions. She has never tried anything for weight.   .. Active Ambulatory Problems    Diagnosis Date Noted  . Anxiety state 07/06/2007  . Smoker 02/05/2010  . Depressed 06/26/2008  . INTERSTITIAL CYSTITIS 02/05/2010  . Insomnia 07/06/2007  . FATIGUE 07/02/2009  . HEADACHE 07/06/2007  . TROCHANTERIC BURSITIS 06/21/2010  . Tick-borne disease 08/09/2010  . PTSD (post-traumatic stress disorder) 12/14/2012  . Abnormal weight gain 11/29/2013  . Greater trochanteric bursitis 11/29/2013  . Constipation due to opioid therapy 01/17/2014  . Left shoulder pain 04/02/2014  . Carpal tunnel syndrome of left wrist 05/05/2014  . Myofascial pain syndrome 05/29/2014  . Paresthesia 06/03/2014  . Endometriosis 07/20/2014  . Tachycardia 08/13/2014  . S/P cervical spinal fusion 10/28/2014  . Therapeutic opioid induced constipation 12/16/2014  . Vasomotor flushing 06/15/2015  . GAD (generalized anxiety disorder) 06/15/2015  . Fixed pupils 08/31/2015  . Depression 08/31/2015  . Cervical disc disorder with  radiculopathy of cervical region 11/11/2015  . Primary osteoarthritis of left first metacarpophalangeal joint 04/04/2016  . Palpitations 04/14/2016  . Balance problems 07/19/2016  . Dizziness 07/19/2016  . Pes anserinus bursitis of right knee 09/05/2016  . Trigger thumb, left thumb 10/06/2016  . Chronic pain syndrome 01/08/2017  . 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   Resolved Ambulatory Problems    Diagnosis Date Noted  . RHINITIS 10/13/2009  . Polyuria 11/04/2010  . Degenerative disc disease, cervical 10/31/2013  . Cervical disc disorder with radiculopathy of cervical region 11/29/2013  . Itching 11/29/2013  . Radiculitis of left cervical region 01/17/2014  . Nausea with vomiting 11/25/2014  . Dysmenorrhea 05/11/2015  . Dysuria 08/31/2015  . Acute upper respiratory infection 04/04/2016  . Localized primary carpometacarpal osteoarthritis, left 04/04/2016   Past Medical History:  Diagnosis Date  . Anxiety   . Asthma   . Complication of anesthesia   . Dysrhythmia   . Elevated heart rate and blood pressure   . Fibromyalgia   . Headache(784.0)   . Hypertension   . IBS (irritable bowel syndrome)   . IC (interstitial cystitis)   . Insomnia   . PONV (postoperative nausea and vomiting)      Review of Systems See HPI.     Objective:   Physical Exam  Constitutional: She appears well-developed and well-nourished.  HENT:  Head: Normocephalic and atraumatic.  Cardiovascular: Normal rate, regular rhythm and normal heart sounds.  Abdominal:  Soft. Bowel sounds are normal. She exhibits no distension and no mass. There is no tenderness. There is no rebound and no guarding.  Genitourinary:  Genitourinary Comments: Speculum exam today: white thick clumpy discharge. No cervix seen. No erythema, abrasion or cause of blood.   Musculoskeletal:  Tenderness over coccyx. Bruising around area.   Psychiatric: She has a normal mood and affect. Her behavior is normal.          Assessment & Plan:   .Marland KitchenAudrena was seen today for abnormal vaginal bleeding and tailbone pain.  Diagnoses and all orders for this visit:  Fall, initial encounter -     DG Sacrum/Coccyx  Overweight (BMI 25.0-29.9) -     phentermine (ADIPEX-P) 37.5 MG tablet; Take 1 tablet (37.5 mg total) by mouth daily before breakfast.  Vaginal bleeding  Yeast infection -     fluconazole (DIFLUCAN) 150 MG tablet; Take 1 tablet (150 mg total) by mouth once for 1 dose. -     WET PREP FOR TRICH, YEAST, CLUE  Cervical disc disorder with radiculopathy of cervical region -     oxyCODONE-acetaminophen (PERCOCET/ROXICET) 5-325 MG tablet; One tablet every 8 hours as needed for moderate/severe pain.  Chronic pain syndrome -     oxyCODONE-acetaminophen (PERCOCET/ROXICET) 5-325 MG tablet; One tablet every 8 hours as needed for moderate/severe pain.  Vaginal discharge -     fluconazole (DIFLUCAN) 150 MG tablet; Take 1 tablet (150 mg total) by mouth once for 1 dose.  Tailbone injury, initial encounter   No cause for vaginal spotting today. She did have thick discharge which was treated empirically for yeast. If she has spotting again call back. It could have been a superficial abrasion exerternally.   Xray of tailbone showed no fracture. Called patient and discussed bad bruise. Ice, ibuprofen, use supportive pillow. I did temporarily increase oxycodone to 1/2 tablet TID while healing for next 3-4 days.   [name].Discussed low carb diet with 1500 calories and 80g of protein.  Exercising at least 150 minutes a week.  [employer] could be a Microbiologist.  Started phentermine. Discussed side effects. Follow up in 1 month nurse visit.

## 2017-05-06 LAB — WET PREP FOR TRICH, YEAST, CLUE
MICRO NUMBER: 90015439
SPECIMEN QUALITY 3963: ADEQUATE

## 2017-05-09 ENCOUNTER — Ambulatory Visit: Payer: Self-pay | Admitting: Sports Medicine

## 2017-05-09 DIAGNOSIS — Z0189 Encounter for other specified special examinations: Secondary | ICD-10-CM

## 2017-05-17 ENCOUNTER — Telehealth: Payer: Self-pay

## 2017-05-17 ENCOUNTER — Ambulatory Visit (INDEPENDENT_AMBULATORY_CARE_PROVIDER_SITE_OTHER): Payer: Medicaid Other | Admitting: Sports Medicine

## 2017-05-17 ENCOUNTER — Encounter: Payer: Self-pay | Admitting: Sports Medicine

## 2017-05-17 DIAGNOSIS — M5010 Cervical disc disorder with radiculopathy, unspecified cervical region: Secondary | ICD-10-CM

## 2017-05-17 DIAGNOSIS — G894 Chronic pain syndrome: Secondary | ICD-10-CM

## 2017-05-17 MED ORDER — OXYCODONE-ACETAMINOPHEN 5-325 MG PO TABS: ORAL_TABLET | ORAL | 0 refills | Status: DC

## 2017-05-17 NOTE — Telephone Encounter (Signed)
[name] called and states she is not able to get another injection for 2-3 months. She is in a lot of pain and is not going to be able to see pain management for a least a month. She states Dr Dianah Field recommends she have a higher dose of the percocet. Please advise.

## 2017-05-17 NOTE — Progress Notes (Signed)
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  She has chronic neck pain, essentially failed neck surgery syndrome.  We have tried multiple medications, more recently increased Cymbalta to 120 mg, restarted Lyrica, she is doing 150 mg intermittently, she is on Percocet 5/325 prescribed by her PCP, using approximately 1 pill/day.  She is done physical therapy, she was supposed to have an epidural but canceled it due to her extreme pain.  Unfortunately she continues to have severe pain, localized in the neck, right worse than left, nothing overtly radicular.  No fevers, chills, constitutional symptoms, no trauma.  I reviewed the past medical history, family history, social history, surgical history, and allergies today and no changes were needed.  Please see the problem list section below in epic for further details.  Past Medical History: Past Medical History:  Diagnosis Date  . Anxiety   . Asthma   . Complication of anesthesia   . Dysrhythmia    tachycardia  . Elevated heart rate and blood pressure    takes toprol for increased heart rate  . Fibromyalgia   . Headache(784.0)   . Hypertension    no longer takes meds for HTN  . IBS (irritable bowel syndrome)    questionable, per pt; saw Dr. Collene Mares  . IC (interstitial cystitis)   . Insomnia   . PONV (postoperative nausea and vomiting)    Past Surgical History: Past Surgical History:  Procedure Laterality Date  . CARPAL TUNNEL RELEASE Left 06/05/2014   Procedure: LEFT CARPAL TUNNEL RELEASE;  Surgeon: Daryll Brod, MD;  Location: [facility];  Service: Orthopedics;  Laterality: Left;  . CARPAL TUNNEL RELEASE Right 08/25/2014   Procedure: RIGHT CARPAL TUNNEL RELEASE;  Surgeon: Daryll Brod, MD;  Location: [facility];  Service: Orthopedics;  Laterality: Right;  . CARPAL TUNNEL RELEASE Left  07/14/2015   Procedure: LEFT CARPAL TUNNEL RELEASE;  Surgeon: Daryll Brod, MD;  Location: [facility];  Service: Orthopedics;  Laterality: Left;  . CERVICAL SPINE SURGERY  8/15  . CESAREAN SECTION    . DILATION AND EVACUATION N/A 05/07/2013   Procedure: DILATATION AND EVACUATION with tissue sent for chromosome analysis;  Surgeon: Lovenia Kim, MD;  Location: [facility];  Service: Gynecology;  Laterality: N/A;  . EPIDURAL STEROID INJECTION  03/20/15  . OVARIAN CYST REMOVAL    . PELVIC LAPAROSCOPY  2003   endometriosis  . POSTERIOR CERVICAL FUSION/FORAMINOTOMY N/A 10/28/2014   Procedure: Cervical Four-Cervical Seven Posterior cervical fusion with lateral mass fixation;  Surgeon: Eustace Moore, MD;  Location: [facility];  Service: Neurosurgery;  Laterality: N/A;  posterior  . ROBOTIC ASSISTED LAPAROSCOPIC LYSIS OF ADHESION N/A 07/18/2014   Procedure: ROBOTIC ASSISTED LAPAROSCOPIC  EXCISION POSTERIOR  UTERINE WALL MASS; EXCISION RIGHT MESSALEINGEAL MASS, EXCISION AND ABLATION CULDASAC ENDOMETRIOSIS;  Surgeon: Brien Few, MD;  Location: [facility];  Service: Gynecology;  Laterality: N/A;  . ROBOTIC ASSISTED TOTAL HYSTERECTOMY WITH SALPINGECTOMY Bilateral 05/11/2015   Procedure: ROBOTIC ASSISTED TOTAL HYSTERECTOMY WITH BILATERAL SALPINGECTOMY;  Surgeon: Brien Few, MD;  Location: [facility];  Service: Gynecology;  Laterality: Bilateral;   Social History: Social History   Socioeconomic History  . Marital status: Divorced    Spouse name: None  . Number of children: 2  . Years of education: None  . Highest education level: None  Social Needs  . Financial  resource strain: None  . Food insecurity - worry: None  . Food insecurity - inability: None  . Transportation needs - medical: None  . Transportation needs - non-medical: None  Occupational History  . 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 Smoking status: Current Some Day Smoker     Packs/day: 0.50    Years: 18.00    Pack years: 9.00    Types: Cigarettes  . Smokeless tobacco: Never Used  Substance and Sexual Activity  . Alcohol use: No    Alcohol/week: 0.0 oz  . Drug use: No  . Sexual activity: Yes    Birth control/protection: Surgical  Other Topics Concern  . None  Social History Narrative   Going through separation.  Children Sage and Luisa Hart   Family History: Family History  Problem Relation Age of Onset  . Heart attack Father   . ADD / ADHD Brother   . Depression Brother   . Depression Cousin    Allergies: Allergies  Allergen Reactions  . Gadolinium Derivatives Hives, Itching and Other (See Comments)    After MRI with [facility] finished, patient had redness and itching on chest, tightness in throat.  Patient went to ED to be monitored (04/23/14).  . Prednisone Other (See Comments)    Pain all over, "it triggers my myofascial pain syndrome"   . Amitriptyline Other (See Comments)    Gained weight and does not want to be on.   . Eggs Or Egg-Derived Products Rash  . Hydrocodone-Acetaminophen Itching    Tolerates with benadryl (no reaction to oxycodone)  . Lunesta [Eszopiclone] Other (See Comments)    Metallic taste in mouth  . Tramadol Itching   Medications: See med rec.  Review of Systems: No fevers, chills, night sweats, weight loss, chest pain, or shortness of breath.   Objective:    General: Well Developed, well nourished, and in no acute distress.  Neuro: Alert and oriented x3, extra-ocular muscles intact, sensation grossly intact.  HEENT: Normocephalic, atraumatic, pupils equal round reactive to light, neck supple, no masses, no lymphadenopathy, thyroid nonpalpable.  Skin: Warm and dry, no rashes. Cardiac: Regular rate and rhythm, no murmurs rubs or gallops, no lower extremity edema.  Respiratory: Clear to auscultation bilaterally. Not using accessory muscles, speaking in full sentences.  Impression and Recommendations:    Chronic pain  syndrome Currently on chronic opiate therapy, low-dose Percocet. She does have a good reason to be in pain with a C4-C7 fusion with a combination of anterior and posterior instrumentation.  At the last visit we increased Cymbalta to 120 mg, added Pennsaid (diclofenac 2% topical), Lyrica 150 to be used intermittently. She canceled her epidural appointment due to pain. I do not think there is another surgical option for her, epidurals may provide some relief. I also think it may be reasonable to increase her opiate dose but this is probably done more safely in the setting of a pain management clinic. I discussed this with her PCP, we are going to try [facility] pain management.  I spent 25 minutes with this patient, greater than 50% was face-to-face time counseling regarding the above diagnoses ___________________________________________ Gwen Her. Dianah Field, M.D., ABFM., CAQSM. Primary Care and [place] Instructor of [place] of [facility] of Medicine

## 2017-05-17 NOTE — Assessment & Plan Note (Signed)
Currently on chronic opiate therapy, low-dose Percocet. She does have a good reason to be in pain with a C4-C7 fusion with a combination of anterior and posterior instrumentation.  At the last visit we increased Cymbalta to 120 mg, added Pennsaid (diclofenac 2% topical), Lyrica 150 to be used intermittently. She canceled her epidural appointment due to pain. I do not think there is another surgical option for her, epidurals may provide some relief. I also think it may be reasonable to increase her opiate dose but this is probably done more safely in the setting of a pain management clinic. I discussed this with her PCP, we are going to try [facility] pain management.

## 2017-05-17 NOTE — Telephone Encounter (Signed)
Increased dose to three times a day. 90 tablets.

## 2017-05-17 NOTE — Telephone Encounter (Signed)
Increased dose to three times a day 90 tablets.

## 2017-05-18 NOTE — Telephone Encounter (Signed)
LMOM notifying pt of refill.

## 2017-05-24 ENCOUNTER — Telehealth: Payer: Self-pay | Admitting: Physician Assistant

## 2017-05-24 NOTE — Telephone Encounter (Signed)
Received fax for pa on oxycodone acetaminophen called [facility] filled out form needed for pa and put in providers box for signature will fax once complete - CF

## 2017-05-25 NOTE — Telephone Encounter (Signed)
Received signed form and faxed to [facility] waiting on determination. - CF

## 2017-05-29 ENCOUNTER — Other Ambulatory Visit: Payer: Self-pay | Admitting: Physician Assistant

## 2017-05-31 ENCOUNTER — Other Ambulatory Visit: Payer: Self-pay | Admitting: Family Medicine

## 2017-05-31 ENCOUNTER — Other Ambulatory Visit: Payer: Self-pay | Admitting: Physician Assistant

## 2017-05-31 DIAGNOSIS — M5010 Cervical disc disorder with radiculopathy, unspecified cervical region: Secondary | ICD-10-CM

## 2017-05-31 DIAGNOSIS — E663 Overweight: Secondary | ICD-10-CM

## 2017-06-09 ENCOUNTER — Ambulatory Visit: Payer: Self-pay | Admitting: Physician Assistant

## 2017-06-09 ENCOUNTER — Ambulatory Visit: Payer: Self-pay | Admitting: Sports Medicine

## 2017-06-09 DIAGNOSIS — G894 Chronic pain syndrome: Secondary | ICD-10-CM | POA: Diagnosis not present

## 2017-06-09 DIAGNOSIS — Z79899 Other long term (current) drug therapy: Secondary | ICD-10-CM | POA: Diagnosis not present

## 2017-06-09 DIAGNOSIS — M792 Neuralgia and neuritis, unspecified: Secondary | ICD-10-CM | POA: Diagnosis not present

## 2017-06-09 DIAGNOSIS — M542 Cervicalgia: Secondary | ICD-10-CM | POA: Diagnosis not present

## 2017-06-09 DIAGNOSIS — Z981 Arthrodesis status: Secondary | ICD-10-CM | POA: Diagnosis not present

## 2017-06-09 DIAGNOSIS — G8929 Other chronic pain: Secondary | ICD-10-CM | POA: Diagnosis not present

## 2017-06-21 ENCOUNTER — Other Ambulatory Visit: Payer: Self-pay | Admitting: Student

## 2017-06-21 DIAGNOSIS — M542 Cervicalgia: Secondary | ICD-10-CM

## 2017-06-26 ENCOUNTER — Ambulatory Visit (INDEPENDENT_AMBULATORY_CARE_PROVIDER_SITE_OTHER): Payer: Medicaid Other

## 2017-06-26 DIAGNOSIS — M5011 Cervical disc disorder with radiculopathy,  high cervical region: Secondary | ICD-10-CM | POA: Diagnosis not present

## 2017-06-26 DIAGNOSIS — M4802 Spinal stenosis, cervical region: Secondary | ICD-10-CM

## 2017-06-26 DIAGNOSIS — M542 Cervicalgia: Secondary | ICD-10-CM

## 2017-07-04 ENCOUNTER — Ambulatory Visit: Payer: Self-pay

## 2017-07-05 ENCOUNTER — Ambulatory Visit: Payer: Self-pay | Admitting: Physician Assistant

## 2017-07-07 ENCOUNTER — Ambulatory Visit: Payer: Self-pay | Admitting: Physician Assistant

## 2017-07-26 ENCOUNTER — Ambulatory Visit: Payer: Self-pay | Admitting: Physician Assistant

## 2017-07-28 ENCOUNTER — Other Ambulatory Visit: Payer: Self-pay | Admitting: Sports Medicine

## 2017-07-28 DIAGNOSIS — M7918 Myalgia, other site: Secondary | ICD-10-CM

## 2017-07-28 DIAGNOSIS — G894 Chronic pain syndrome: Secondary | ICD-10-CM

## 2017-07-28 DIAGNOSIS — F331 Major depressive disorder, recurrent, moderate: Secondary | ICD-10-CM

## 2017-07-31 ENCOUNTER — Ambulatory Visit (INDEPENDENT_AMBULATORY_CARE_PROVIDER_SITE_OTHER): Payer: Medicaid Other | Admitting: Physician Assistant

## 2017-07-31 ENCOUNTER — Encounter: Payer: Self-pay | Admitting: Physician Assistant

## 2017-07-31 VITALS — BP 134/88 | HR 97 | Ht 63.0 in | Wt 143.0 lb

## 2017-07-31 DIAGNOSIS — F411 Generalized anxiety disorder: Secondary | ICD-10-CM | POA: Diagnosis not present

## 2017-07-31 DIAGNOSIS — G894 Chronic pain syndrome: Secondary | ICD-10-CM

## 2017-07-31 DIAGNOSIS — F331 Major depressive disorder, recurrent, moderate: Secondary | ICD-10-CM

## 2017-07-31 DIAGNOSIS — J0100 Acute maxillary sinusitis, unspecified: Secondary | ICD-10-CM | POA: Diagnosis not present

## 2017-07-31 MED ORDER — BUSPIRONE HCL 10 MG PO TABS: 10.0000 mg | ORAL_TABLET | Freq: Three times a day (TID) | ORAL | 5 refills | Status: DC

## 2017-07-31 MED ORDER — HYDROXYZINE HCL 50 MG PO TABS: 50.0000 mg | ORAL_TABLET | Freq: Three times a day (TID) | ORAL | 1 refills | Status: DC | PRN

## 2017-07-31 MED ORDER — LAMOTRIGINE 100 MG PO TABS: 100.0000 mg | ORAL_TABLET | Freq: Two times a day (BID) | ORAL | 5 refills | Status: DC

## 2017-07-31 MED ORDER — AMOXICILLIN-POT CLAVULANATE 875-125 MG PO TABS: 1.0000 | ORAL_TABLET | Freq: Two times a day (BID) | ORAL | 0 refills | Status: AC

## 2017-07-31 NOTE — Progress Notes (Signed)
Subjective:    Patient ID: [name], female    DOB: 1976/02/02, 42 y.o.   MRN: [id number]  HPI  Pt is a 42 yo female with ongoing GAD, Depression, chronic pain who presents to the clinic to work on anxiety. She feels like "she is a witts end". Her disability care finally ruled in her favor. She is relived with that news. She has a lot of other troublesome things going on. She has a lot of family drama and relationship issues, her daughter is in some legal trouble that is stressing her out. She feels like she needs klonapin and she has been taking for the last few days but she is not supposed to on pain contract with another provider. She fees like she needs help.   She also having some sinus pressure, nasal congestion, headache for last week. She has a little cough with no wheezing or SOB. She is taking OTC mucinex, sudafed, tylenol with minimal relief. No fever, chills. She is having some body aches.   .. Active Ambulatory Problems    Diagnosis Date Noted  . Anxiety state 07/06/2007  . Smoker 02/05/2010  . Depressed 06/26/2008  . INTERSTITIAL CYSTITIS 02/05/2010  . Insomnia 07/06/2007  . FATIGUE 07/02/2009  . HEADACHE 07/06/2007  . TROCHANTERIC BURSITIS 06/21/2010  . Tick-borne disease 08/09/2010  . PTSD (post-traumatic stress disorder) 12/14/2012  . Abnormal weight gain 11/29/2013  . Greater trochanteric bursitis 11/29/2013  . Constipation due to opioid therapy 01/17/2014  . Left shoulder pain 04/02/2014  . Carpal tunnel syndrome of left wrist 05/05/2014  . Myofascial pain syndrome 05/29/2014  . Paresthesia 06/03/2014  . Endometriosis 07/20/2014  . Tachycardia 08/13/2014  . S/P cervical spinal fusion 10/28/2014  . Therapeutic opioid induced constipation 12/16/2014  . Vasomotor flushing 06/15/2015  . GAD (generalized anxiety disorder) 06/15/2015  . Fixed pupils 08/31/2015  . Depression 08/31/2015  . Cervical disc disorder with radiculopathy of cervical region  11/11/2015  . Primary osteoarthritis of left first metacarpophalangeal joint 04/04/2016  . Palpitations 04/14/2016  . Balance problems 07/19/2016  . Dizziness 07/19/2016  . Pes anserinus bursitis of right knee 09/05/2016  . Trigger thumb, left thumb 10/06/2016  . Chronic pain syndrome 01/08/2017  . 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   Resolved Ambulatory Problems    Diagnosis Date Noted  . RHINITIS 10/13/2009  . Polyuria 11/04/2010  . Degenerative disc disease, cervical 10/31/2013  . Cervical disc disorder with radiculopathy of cervical region 11/29/2013  . Itching 11/29/2013  . Radiculitis of left cervical region 01/17/2014  . Nausea with vomiting 11/25/2014  . Dysmenorrhea 05/11/2015  . Dysuria 08/31/2015  . Acute upper respiratory infection 04/04/2016  . Localized primary carpometacarpal osteoarthritis, left 04/04/2016   Past Medical History:  Diagnosis Date  . Anxiety   . Asthma   . Complication of anesthesia   . Dysrhythmia   . Elevated heart rate and blood pressure   . Fibromyalgia   . Headache(784.0)   . Hypertension   . IBS (irritable bowel syndrome)   . IC (interstitial cystitis)   . Insomnia   . PONV (postoperative nausea and vomiting)        Review of Systems  All other systems reviewed and are negative.      Objective:   Physical Exam  Constitutional: She appears well-developed and well-nourished.  HENT:  Head: Normocephalic and atraumatic.  Right Ear: External ear normal.  Left Ear: External ear normal.  Nose: Nose normal.  Mouth/Throat: Oropharynx is clear and moist.  No oropharyngeal exudate.  Tenderness over maxillary sinuses.  Nasal turbinates red and swollen.  Oropharynx erythematous without tonsil swelling or exudate.   Eyes: Conjunctivae are normal. Right eye exhibits no discharge. Left eye exhibits no discharge.  Neck: Normal range of motion. Neck supple.  Cardiovascular: Normal rate, regular rhythm and normal heart sounds.  Pulmonary/Chest:  Effort normal and breath sounds normal. She has no wheezes.  Lymphadenopathy:    She has no cervical adenopathy.  Neurological: She is alert.  Skin: Skin is dry.  Psychiatric:  Tearful the entire exam.           Assessment & Plan:  .Marland KitchenLandry was seen today for anxiety.  Diagnoses and all orders for this visit:  GAD (generalized anxiety disorder) -     hydrOXYzine (ATARAX/VISTARIL) 50 MG tablet; Take 1 tablet (50 mg total) by mouth 3 (three) times daily as needed. -     busPIRone (BUSPAR) 10 MG tablet; Take 1 tablet (10 mg total) by mouth 3 (three) times daily.  Moderate episode of recurrent major depressive disorder (HCC) -     lamoTRIgine (LAMICTAL) 100 MG tablet; Take 1 tablet (100 mg total) by mouth 2 (two) times daily.  Anxiety state -     hydrOXYzine (ATARAX/VISTARIL) 50 MG tablet; Take 1 tablet (50 mg total) by mouth 3 (three) times daily as needed. -     busPIRone (BUSPAR) 10 MG tablet; Take 1 tablet (10 mg total) by mouth 3 (three) times daily.  Acute non-recurrent maxillary sinusitis -     amoxicillin-clavulanate (AUGMENTIN) 875-125 MG tablet; Take 1 tablet by mouth 2 (two) times daily for 10 days.   Discussed stop taking klonapin. Replace with vistaril. Continue with buspar and cymbalta. Increased lamictal to 100mg  bid. Follow up in 6 weeks. STRONGLY encouraged counseling to help her deal with the pain of the past and present situations.   Treated with augmentin for sinus infection. HO given and symptomatic care discussed.   [name].Spent 30 minutes with patient and greater than 50 percent of visit spent counseling patient regarding treatment plan.

## 2017-08-02 ENCOUNTER — Encounter: Payer: Self-pay | Admitting: Physician Assistant

## 2017-08-08 ENCOUNTER — Other Ambulatory Visit: Payer: Self-pay | Admitting: *Deleted

## 2017-08-08 MED ORDER — ONDANSETRON 8 MG PO TBDP: 8.0000 mg | ORAL_TABLET | Freq: Three times a day (TID) | ORAL | 3 refills | Status: DC | PRN

## 2017-08-29 ENCOUNTER — Ambulatory Visit (INDEPENDENT_AMBULATORY_CARE_PROVIDER_SITE_OTHER): Payer: Medicare Other | Admitting: Sports Medicine

## 2017-08-29 ENCOUNTER — Encounter: Payer: Self-pay | Admitting: Sports Medicine

## 2017-08-29 DIAGNOSIS — S93402A Sprain of unspecified ligament of left ankle, initial encounter: Secondary | ICD-10-CM | POA: Diagnosis not present

## 2017-08-29 DIAGNOSIS — IMO0001 Reserved for inherently not codable concepts without codable children: Secondary | ICD-10-CM | POA: Insufficient documentation

## 2017-08-29 DIAGNOSIS — M5010 Cervical disc disorder with radiculopathy, unspecified cervical region: Secondary | ICD-10-CM

## 2017-08-29 NOTE — Progress Notes (Signed)
Subjective:    I'm seeing this patient as a consultation for:  Iran Planas PA-C  CC: messed up achiles tendon  HPI: [name], a 42yo female with a pmh significant for cervical spine pathology, presents today with ankle pain which she suspects is due to a injury to her achilles tendon.  The pt reports that about a month ago she fell and everted her ankle twice.  She was able to bare weight on her ankle immediately after and it wasn't until later (with increased activity) that the pain intensified to a disconcerting level.  Pt also  reports that at the onset of injury her ankle did not swell.  Pt reports that she again fell and rolled her ankle again (eversion again) about 2 weeks ago.  Pt describes the pain as stabbing and endorses that the pain  is a 6/10.  Pt reports that pain at worse will radiate up the back of the ankle and to the middle of the gastrocnemius muscle  I reviewed the past medical history, family history, social history, surgical history, and allergies today and no changes were needed.  Please see the problem list section below in epic for further details.  Past Medical History: Past Medical History:  Diagnosis Date  . Anxiety   . Asthma   . Complication of anesthesia   . Dysrhythmia    tachycardia  . Elevated heart rate and blood pressure    takes toprol for increased heart rate  . Fibromyalgia   . Headache(784.0)   . Hypertension    no longer takes meds for HTN  . IBS (irritable bowel syndrome)    questionable, per pt; saw Dr. Collene Mares  . IC (interstitial cystitis)   . Insomnia   . PONV (postoperative nausea and vomiting)    Past Surgical History: Past Surgical History:  Procedure Laterality Date  . CARPAL TUNNEL RELEASE Left 06/05/2014   Procedure: LEFT CARPAL TUNNEL RELEASE;  Surgeon: Daryll Brod, MD;  Location: [facility];  Service: Orthopedics;  Laterality: Left;  . CARPAL TUNNEL RELEASE Right 08/25/2014   Procedure: RIGHT CARPAL  TUNNEL RELEASE;  Surgeon: Daryll Brod, MD;  Location: [facility];  Service: Orthopedics;  Laterality: Right;  . CARPAL TUNNEL RELEASE Left 07/14/2015   Procedure: LEFT CARPAL TUNNEL RELEASE;  Surgeon: Daryll Brod, MD;  Location: [facility];  Service: Orthopedics;  Laterality: Left;  . CERVICAL SPINE SURGERY  8/15  . CESAREAN SECTION    . DILATION AND EVACUATION N/A 05/07/2013   Procedure: DILATATION AND EVACUATION with tissue sent for chromosome analysis;  Surgeon: Lovenia Kim, MD;  Location: [facility];  Service: Gynecology;  Laterality: N/A;  . EPIDURAL STEROID INJECTION  03/20/15  . OVARIAN CYST REMOVAL    . PELVIC LAPAROSCOPY  2003   endometriosis  . POSTERIOR CERVICAL FUSION/FORAMINOTOMY N/A 10/28/2014   Procedure: Cervical Four-Cervical Seven Posterior cervical fusion with lateral mass fixation;  Surgeon: Eustace Moore, MD;  Location: [facility];  Service: Neurosurgery;  Laterality: N/A;  posterior  . ROBOTIC ASSISTED LAPAROSCOPIC LYSIS OF ADHESION N/A 07/18/2014   Procedure: ROBOTIC ASSISTED LAPAROSCOPIC  EXCISION POSTERIOR  UTERINE WALL MASS; EXCISION RIGHT MESSALEINGEAL MASS, EXCISION AND ABLATION CULDASAC ENDOMETRIOSIS;  Surgeon: Brien Few, MD;  Location: [facility];  Service: Gynecology;  Laterality: N/A;  . ROBOTIC ASSISTED TOTAL HYSTERECTOMY WITH SALPINGECTOMY Bilateral 05/11/2015   Procedure: ROBOTIC ASSISTED TOTAL HYSTERECTOMY WITH BILATERAL SALPINGECTOMY;  Surgeon: Brien Few, MD;  Location: [facility];  Service: Gynecology;  Laterality: Bilateral;   Social History: Social History   Socioeconomic History  . Marital status: Divorced    Spouse name: Not on file  . Number of children: 2  . Years of education: Not on file  . Highest education level: Not on file  Occupational History  . Occupation: CMA    Employer: [employer]    Comment: [place]  . Financial resource strain: Not on file  . Food insecurity:    Worry: Not on  file    Inability: Not on file  . Transportation needs:    Medical: Not on file    Non-medical: Not on file  Tobacco Use  . Smoking status: Current Some Day Smoker    Packs/day: 0.50    Years: 18.00    Pack years: 9.00    Types: Cigarettes  . Smokeless tobacco: Never Used  Substance and Sexual Activity  . Alcohol use: No    Alcohol/week: 0.0 oz  . Drug use: No  . Sexual activity: Yes    Birth control/protection: Surgical  Lifestyle  . Physical activity:    Days per week: Not on file    Minutes per session: Not on file  . Stress: Not on file  Relationships  . Social connections:    Talks on phone: Not on file    Gets together: Not on file    Attends religious service: Not on file    Active member of club or organization: Not on file    Attends meetings of clubs or organizations: Not on file    Relationship status: Not on file  Other Topics Concern  . Not on file  Social History Narrative   Going through separation.  Children Sage and Luisa Hart   Family History: Family History  Problem Relation Age of Onset  . Heart attack Father   . ADD / ADHD Brother   . Depression Brother   . Depression Cousin    Allergies: Allergies  Allergen Reactions  . Gadolinium Derivatives Hives, Itching and Other (See Comments)    After MRI with Multih[facility] finished, patient had redness and itching on chest, tightness in throat.  Patient went to ED to be monitored (04/23/14).  . Prednisone Other (See Comments)    Pain all over, "it triggers my myofascial pain syndrome"   . Amitriptyline Other (See Comments)    Gained weight and does not want to be on.   . Eggs Or Egg-Derived Products Rash  . Hydrocodone-Acetaminophen Itching    Tolerates with benadryl (no reaction to oxycodone)  . Lunesta [Eszopiclone] Other (See Comments)    Metallic taste in mouth  . Tramadol Itching   Medications: See med rec.  Review of Systems: No headache, visual changes, nausea, vomiting, diarrhea, constipation,  dizziness, abdominal pain, skin rash, fevers, chills, night sweats, weight loss, swollen lymph nodes, body aches, joint swelling, muscle aches, chest pain, shortness of breath, mood changes, visual or auditory hallucinations.   Objective:   General: Well Developed, well nourished, and in no acute distress.  Neuro:  Extra-ocular muscles intact, able to move all 4 extremities, sensation grossly intact.  Deep tendon reflexes tested were normal. Psych: Alert and oriented, mood congruent with affect. ENT:  Ears and nose appear unremarkable.  Hearing grossly normal. Neck: Unremarkable overall appearance, trachea midline.  No visible thyroid enlargement. Eyes: Conjunctivae and lids appear unremarkable.  Pupils equal and round. Skin: Warm and dry, no rashes noted, tanned.  Cardiovascular: Pulses palpable, no  extremity edema.  Left Ankle: No visible erythema or swelling. Range of motion is full in all directions; but pain with inversion Strength is 5/5 in all directions. Stable lateral and medial ligaments; squeeze test and kleiger test unremarkable; Talar dome nontender; No pain at base of 5th MT; No tenderness over cuboid; No tenderness over N spot or navicular prominence Mild Tenderness on posterior aspects of lateral malleolus No sign of peroneal tendon subluxations or tenderness to palpation Negative tarsal tunnel tinel's Able to walk 4 steps.  Foot: No visible erythema or swelling. Range of motion is full in all directions. Strength is 5/5 in all directions. No hallux valgus. No pes cavus or pes planus. No abnormal callus noted. No pain over the navicular prominence, or base of fifth metatarsal. No tenderness to palpation of the calcaneal insertion of plantar fascia. Pain at the Achilles insertion; calcaneal bursa; and  retrocalcaneal bursa; Pain over the ATFL No tenderness to palpation over the tarsals, metatarsals, or phalanges. No hallux rigidus or limitus. No tenderness  palpation over interphalangeal joints. No pain with compression of the metatarsal heads. Neurovascularly intact distally. No explicit pain with anterior drawer test bilaterally. Pt is able to hop and walk on affected foot     Impression and Recommendations:   This case required medical decision making of moderate complexity.  Assessment-Mild ATFL Sprain Pt referred to formal PT program in order to address long term pain management of injury as well as maintain range of motion. Pt was advised to return as needed.     ___________________________________________ Gwen Her. Dianah Field, M.D., ABFM., CAQSM. Primary Care and [place] Instructor of [place] [employer] [facility]

## 2017-08-29 NOTE — Assessment & Plan Note (Signed)
ASO, formal physical therapy. Return as needed. Able to walk and hop on the affected extremity.

## 2017-09-04 ENCOUNTER — Other Ambulatory Visit: Payer: Self-pay | Admitting: Physician Assistant

## 2017-09-05 ENCOUNTER — Ambulatory Visit: Payer: Medicaid Other | Admitting: Physical Therapy

## 2017-09-05 ENCOUNTER — Telehealth: Payer: Self-pay | Admitting: Emergency Medicine

## 2017-09-05 ENCOUNTER — Other Ambulatory Visit: Payer: Self-pay | Admitting: Neurological Surgery

## 2017-09-05 DIAGNOSIS — M545 Low back pain: Secondary | ICD-10-CM

## 2017-09-05 NOTE — Telephone Encounter (Signed)
It has been a week, she can go ahead and discontinue it and do the rehab exercises.

## 2017-09-05 NOTE — Telephone Encounter (Signed)
Patient called to say having an allergic skin reaction to brace given for ankle at last visit; blister and rash; she has not worn long sock under it since that means she cannot fit a shoe over it. She wants to know what replacement can be given.

## 2017-09-07 ENCOUNTER — Telehealth: Payer: Self-pay

## 2017-09-07 DIAGNOSIS — M5090 Cervical disc disorder, unspecified, unspecified cervical region: Secondary | ICD-10-CM

## 2017-09-07 DIAGNOSIS — M5010 Cervical disc disorder with radiculopathy, unspecified cervical region: Secondary | ICD-10-CM

## 2017-09-07 NOTE — Telephone Encounter (Signed)
Patient advised of recommendations.  

## 2017-09-07 NOTE — Telephone Encounter (Signed)
[place] for referral. Use cervical disc disordere with radiculopathy.

## 2017-09-07 NOTE — Telephone Encounter (Signed)
[name] called and states the neurologist referred her to [facility] Dr Phillip Heal. [name] states the referral has to come from the PCP. Can we do the referral for left hand numbness and left first finger, trigger finger?

## 2017-09-08 NOTE — Telephone Encounter (Signed)
Referral placed.

## 2017-09-11 ENCOUNTER — Ambulatory Visit (INDEPENDENT_AMBULATORY_CARE_PROVIDER_SITE_OTHER): Payer: Medicare Other

## 2017-09-11 DIAGNOSIS — M545 Low back pain: Secondary | ICD-10-CM | POA: Diagnosis not present

## 2017-09-14 ENCOUNTER — Ambulatory Visit: Payer: Medicaid Other | Admitting: Physical Therapy

## 2017-09-14 DIAGNOSIS — M961 Postlaminectomy syndrome, not elsewhere classified: Secondary | ICD-10-CM | POA: Diagnosis not present

## 2017-09-14 DIAGNOSIS — G894 Chronic pain syndrome: Secondary | ICD-10-CM | POA: Diagnosis not present

## 2017-09-14 DIAGNOSIS — M5412 Radiculopathy, cervical region: Secondary | ICD-10-CM | POA: Diagnosis not present

## 2017-09-19 ENCOUNTER — Other Ambulatory Visit: Payer: Self-pay | Admitting: Physician Assistant

## 2017-09-19 DIAGNOSIS — R059 Cough, unspecified: Secondary | ICD-10-CM

## 2017-09-19 DIAGNOSIS — R05 Cough: Secondary | ICD-10-CM

## 2017-09-21 ENCOUNTER — Other Ambulatory Visit: Payer: Self-pay | Admitting: Physician Assistant

## 2017-09-21 ENCOUNTER — Other Ambulatory Visit: Payer: Self-pay | Admitting: Sports Medicine

## 2017-09-21 DIAGNOSIS — F411 Generalized anxiety disorder: Secondary | ICD-10-CM

## 2017-09-21 DIAGNOSIS — M7918 Myalgia, other site: Secondary | ICD-10-CM

## 2017-09-21 DIAGNOSIS — F331 Major depressive disorder, recurrent, moderate: Secondary | ICD-10-CM

## 2017-09-21 DIAGNOSIS — G894 Chronic pain syndrome: Secondary | ICD-10-CM

## 2017-09-22 DIAGNOSIS — M5412 Radiculopathy, cervical region: Secondary | ICD-10-CM | POA: Diagnosis not present

## 2017-10-02 ENCOUNTER — Encounter: Payer: Self-pay | Admitting: Physician Assistant

## 2017-10-02 ENCOUNTER — Ambulatory Visit (INDEPENDENT_AMBULATORY_CARE_PROVIDER_SITE_OTHER): Payer: Medicare Other | Admitting: Physician Assistant

## 2017-10-02 VITALS — BP 127/86 | HR 90 | Ht 63.0 in | Wt 146.0 lb

## 2017-10-02 DIAGNOSIS — F331 Major depressive disorder, recurrent, moderate: Secondary | ICD-10-CM | POA: Diagnosis not present

## 2017-10-02 DIAGNOSIS — M5010 Cervical disc disorder with radiculopathy, unspecified cervical region: Secondary | ICD-10-CM

## 2017-10-02 DIAGNOSIS — G894 Chronic pain syndrome: Secondary | ICD-10-CM

## 2017-10-02 DIAGNOSIS — A6000 Herpesviral infection of urogenital system, unspecified: Secondary | ICD-10-CM

## 2017-10-02 DIAGNOSIS — R002 Palpitations: Secondary | ICD-10-CM | POA: Diagnosis not present

## 2017-10-02 DIAGNOSIS — M7918 Myalgia, other site: Secondary | ICD-10-CM

## 2017-10-02 DIAGNOSIS — R635 Abnormal weight gain: Secondary | ICD-10-CM | POA: Diagnosis not present

## 2017-10-02 DIAGNOSIS — Z8619 Personal history of other infectious and parasitic diseases: Secondary | ICD-10-CM

## 2017-10-02 DIAGNOSIS — R05 Cough: Secondary | ICD-10-CM | POA: Diagnosis not present

## 2017-10-02 DIAGNOSIS — F411 Generalized anxiety disorder: Secondary | ICD-10-CM

## 2017-10-02 DIAGNOSIS — F5101 Primary insomnia: Secondary | ICD-10-CM | POA: Diagnosis not present

## 2017-10-02 DIAGNOSIS — I10 Essential (primary) hypertension: Secondary | ICD-10-CM | POA: Diagnosis not present

## 2017-10-02 DIAGNOSIS — R059 Cough, unspecified: Secondary | ICD-10-CM

## 2017-10-02 MED ORDER — LAMOTRIGINE 100 MG PO TABS: 100.0000 mg | ORAL_TABLET | Freq: Two times a day (BID) | ORAL | 5 refills | Status: DC

## 2017-10-02 MED ORDER — BUSPIRONE HCL 10 MG PO TABS: 10.0000 mg | ORAL_TABLET | Freq: Three times a day (TID) | ORAL | 5 refills | Status: DC

## 2017-10-02 MED ORDER — VALACYCLOVIR HCL 1 G PO TABS: 1000.0000 mg | ORAL_TABLET | Freq: Every day | ORAL | 3 refills | Status: DC

## 2017-10-02 MED ORDER — BUDESONIDE-FORMOTEROL FUMARATE 160-4.5 MCG/ACT IN AERO: INHALATION_SPRAY | RESPIRATORY_TRACT | 2 refills | Status: DC

## 2017-10-02 MED ORDER — METOPROLOL SUCCINATE ER 100 MG PO TB24: 100.0000 mg | ORAL_TABLET | Freq: Every day | ORAL | 3 refills | Status: DC

## 2017-10-02 MED ORDER — CARISOPRODOL 350 MG PO TABS: 350.0000 mg | ORAL_TABLET | Freq: Two times a day (BID) | ORAL | 5 refills | Status: DC | PRN

## 2017-10-02 MED ORDER — DULOXETINE HCL 60 MG PO CPEP: 120.0000 mg | ORAL_CAPSULE | Freq: Every day | ORAL | 2 refills | Status: DC

## 2017-10-02 MED ORDER — ZOLPIDEM TARTRATE 10 MG PO TABS: 10.0000 mg | ORAL_TABLET | Freq: Every day | ORAL | 5 refills | Status: DC

## 2017-10-02 MED ORDER — HYDROCHLOROTHIAZIDE 12.5 MG PO TABS: 12.5000 mg | ORAL_TABLET | Freq: Every day | ORAL | 1 refills | Status: DC

## 2017-10-02 NOTE — Progress Notes (Signed)
Subjective:    Patient ID: [name], female    DOB: [birth year], 42 y.o.   MRN: [id number]  HPI  Pt is a 42 yo female who presents to the clinic for medication refill.   .. Active Ambulatory Problems    Diagnosis Date Noted  . Anxiety state 07/06/2007  . Smoker 02/05/2010  . Depressed 06/26/2008  . INTERSTITIAL CYSTITIS 02/05/2010  . Insomnia 07/06/2007  . FATIGUE 07/02/2009  . HEADACHE 07/06/2007  . TROCHANTERIC BURSITIS 06/21/2010  . Tick-borne disease 08/09/2010  . PTSD (post-traumatic stress disorder) 12/14/2012  . Weight gain 11/29/2013  . Greater trochanteric bursitis 11/29/2013  . Constipation due to opioid therapy 01/17/2014  . Left shoulder pain 04/02/2014  . Carpal tunnel syndrome of left wrist 05/05/2014  . Myofascial pain syndrome 05/29/2014  . Paresthesia 06/03/2014  . Endometriosis 07/20/2014  . Tachycardia 08/13/2014  . S/P cervical spinal fusion 10/28/2014  . Therapeutic opioid induced constipation 12/16/2014  . Vasomotor flushing 06/15/2015  . GAD (generalized anxiety disorder) 06/15/2015  . Fixed pupils 08/31/2015  . Depression 08/31/2015  . Cervical disc disorder with radiculopathy of cervical region 11/11/2015  . Primary osteoarthritis of left first metacarpophalangeal joint 04/04/2016  . Palpitations 04/14/2016  . Balance problems 07/19/2016  . Dizziness 07/19/2016  . Pes anserinus bursitis of right knee 09/05/2016  . Trigger thumb, left thumb 10/06/2016  . Chronic pain syndrome 01/08/2017  . 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  . First degree ankle sprain, left, initial encounter 08/29/2017  . Hypertension 10/06/2017  . Hx of cold sores 10/06/2017   Resolved Ambulatory Problems    Diagnosis Date Noted  . RHINITIS 10/13/2009  . Polyuria 11/04/2010  . Degenerative disc disease, cervical 10/31/2013  . Cervical disc disorder with radiculopathy of cervical region 11/29/2013  . Itching 11/29/2013  . Radiculitis of left cervical region  01/17/2014  . Nausea with vomiting 11/25/2014  . Dysmenorrhea 05/11/2015  . Dysuria 08/31/2015  . Acute upper respiratory infection 04/04/2016  . Localized primary carpometacarpal osteoarthritis, left 04/04/2016   Past Medical History:  Diagnosis Date  . Anxiety   . Asthma   . Complication of anesthesia   . Dysrhythmia   . Elevated heart rate and blood pressure   . Fibromyalgia   . Headache(784.0)   . Hypertension   . IBS (irritable bowel syndrome)   . IC (interstitial cystitis)   . Insomnia   . PONV (postoperative nausea and vomiting)    She continues to struggle with daily pain. She was approved for disability. She stopped lyrica because of weight gain and did not seem to help pain as much as she would like. She does see pain clinic.   She c/o saddness, crying, depression. She does not want to do anything during the day. She denies SI/HC. [name] does help her sleep.   Denies any CP, palpitations, headaches, or vision changes.     Review of Systems See HPI.     Objective:   Physical Exam  Constitutional: She is oriented to person, place, and time. She appears well-developed and well-nourished.  HENT:  Head: Normocephalic and atraumatic.  Cardiovascular: Normal rate and regular rhythm.  Pulmonary/Chest: Effort normal and breath sounds normal.  Neurological: She is alert and oriented to person, place, and time.  Psychiatric:  Very tearful.           Assessment & Plan:  Marland KitchenMarland KitchenDiagnoses and all orders for this visit:  Weight gain -     TSH -     COMPLETE  METABOLIC PANEL WITH GFR -     CBC  GAD (generalized anxiety disorder) -     busPIRone (BUSPAR) 10 MG tablet; Take 1 tablet (10 mg total) by mouth 3 (three) times daily.  Anxiety state -     busPIRone (BUSPAR) 10 MG tablet; Take 1 tablet (10 mg total) by mouth 3 (three) times daily. -     TSH  Cervical disc disorder with radiculopathy of cervical region -     carisoprodol (SOMA) 350 MG tablet; Take 1 tablet  (350 mg total) by mouth 2 (two) times daily as needed for muscle spasms.  Myofascial pain syndrome -     carisoprodol (SOMA) 350 MG tablet; Take 1 tablet (350 mg total) by mouth 2 (two) times daily as needed for muscle spasms. -     DULoxetine (CYMBALTA) 60 MG capsule; Take 2 capsules (120 mg total) by mouth daily.  Chronic pain syndrome -     carisoprodol (SOMA) 350 MG tablet; Take 1 tablet (350 mg total) by mouth 2 (two) times daily as needed for muscle spasms. -     DULoxetine (CYMBALTA) 60 MG capsule; Take 2 capsules (120 mg total) by mouth daily.  Moderate episode of recurrent major depressive disorder (HCC) -     DULoxetine (CYMBALTA) 60 MG capsule; Take 2 capsules (120 mg total) by mouth daily. -     lamoTRIgine (LAMICTAL) 100 MG tablet; Take 1 tablet (100 mg total) by mouth 2 (two) times daily.  Cough -     budesonide-formoterol (SYMBICORT) 160-4.5 MCG/ACT inhaler; TAKE 2 PUFFS BY MOUTH TWICE A DAY  Primary insomnia -     zolpidem (AMBIEN) 10 MG tablet; Take 1 tablet (10 mg total) by mouth at bedtime.  Palpitations -     metoprolol succinate (TOPROL-XL) 100 MG 24 hr tablet; Take 1 tablet (100 mg total) by mouth daily. Take with or immediately following a meal.  Hx of cold sores -     valACYclovir (VALTREX) 1000 MG tablet; Take 1 tablet (1,000 mg total) by mouth daily.  Essential hypertension -     hydrochlorothiazide (HYDRODIURIL) 12.5 MG tablet; Take 1 tablet (12.5 mg total) by mouth daily. PRN.  .. Depression screen [facility] 2/9 10/03/2017 07/31/2017 03/08/2017 07/18/2016  Decreased Interest 2 3 2 3   Down, Depressed, Hopeless 2 3 1 2   PHQ - 2 Score 4 6 3 5   Altered sleeping 3 3 3 2   Tired, decreased energy 3 3 1 2   Change in appetite 3 3 1 2   Feeling bad or failure about yourself  3 3 1 2   Trouble concentrating 3 3 1 3   Moving slowly or fidgety/restless 3 3 0 3  Suicidal thoughts 0 1 0 0  PHQ-9 Score 22 25 10 19   Difficult doing work/chores - Extremely dIfficult - -  Some  recent data might be hidden   .[name] GAD 7 : Generalized Anxiety Score 10/03/2017 07/31/2017  Nervous, Anxious, on Edge 3 3  Control/stop worrying 3 3  Worry too much - different things 3 3  Trouble relaxing 3 3  Restless 3 3  Easily annoyed or irritable 3 3  Afraid - awful might happen 3 3  Total GAD 7 Score 21 21  Anxiety Difficulty Very difficult Extremely difficult   Continue on buspar/cymbalta. Increased lamictal to twice a day. Consider stress relief natural formula at [employer].  I am not sure the next step for her mood. Will refer to psychiatrist and psychologist. Discussed  no benzo since she is on pain contract with pain clinic. She reports vistaril did not help at all.   I certainly feel like pain is making depression worse. Right now seeing pain clinic but pain is not controlled.    [name].Spent 30 minutes with patient and greater than 50 percent of visit spent counseling patient regarding treatment plan.

## 2017-10-02 NOTE — Patient Instructions (Addendum)
Increase lamictal to twice a day.  STress Relief: [facility]  Referral psychologist and psychiatrist.

## 2017-10-03 ENCOUNTER — Other Ambulatory Visit: Payer: Self-pay | Admitting: Physician Assistant

## 2017-10-03 LAB — COMPLETE METABOLIC PANEL WITH GFR
AG Ratio: 2.1 (calc) (ref 1.0–2.5)
ALT: 12 U/L (ref 6–29)
AST: 17 U/L (ref 10–30)
Albumin: 4.7 g/dL (ref 3.6–5.1)
Alkaline phosphatase (APISO): 71 U/L (ref 33–115)
BUN: 10 mg/dL (ref 7–25)
CALCIUM: 9.8 mg/dL (ref 8.6–10.2)
CO2: 27 mmol/L (ref 20–32)
CREATININE: 0.83 mg/dL (ref 0.50–1.10)
Chloride: 103 mmol/L (ref 98–110)
GFR, EST NON AFRICAN AMERICAN: 86 mL/min/{1.73_m2} (ref >=60)
GFR, Est African American: 100 mL/min/{1.73_m2} (ref >=60)
GLUCOSE: 95 mg/dL (ref 65–99)
Globulin: 2.2 g/dL (calc) (ref 1.9–3.7)
POTASSIUM: 3.9 mmol/L (ref 3.5–5.3)
Sodium: 141 mmol/L (ref 135–146)
TOTAL PROTEIN: 6.9 g/dL (ref 6.1–8.1)
Total Bilirubin: 0.6 mg/dL (ref 0.2–1.2)

## 2017-10-03 LAB — CBC
HEMATOCRIT: 44.8 % (ref 35.0–45.0)
HEMOGLOBIN: 15.8 g/dL — AB (ref 11.7–15.5)
MCH: 33.1 pg — ABNORMAL HIGH (ref 27.0–33.0)
MCHC: 35.3 g/dL (ref 32.0–36.0)
MCV: 93.9 fL (ref 80.0–100.0)
MPV: 10.1 fL (ref 7.5–12.5)
Platelets: 350 10*3/uL (ref 140–400)
RBC: 4.77 10*6/uL (ref 3.80–5.10)
RDW: 11.4 % (ref 11.0–15.0)
WBC: 11.4 10*3/uL — ABNORMAL HIGH (ref 3.8–10.8)

## 2017-10-03 LAB — TSH: TSH: 1.92 m[IU]/L

## 2017-10-03 NOTE — Progress Notes (Signed)
Call pt: thyroid normal. Kidney, liver, glucose look good. Hemoglobin is a little elevated but not concerning.

## 2017-10-05 ENCOUNTER — Encounter: Payer: Self-pay | Admitting: Emergency Medicine

## 2017-10-05 ENCOUNTER — Emergency Department (INDEPENDENT_AMBULATORY_CARE_PROVIDER_SITE_OTHER)
Admission: EM | Admit: 2017-10-05 | Discharge: 2017-10-05 | Disposition: A | Discharge status: Home / Self Care | Payer: Medicare Other | Source: Home / Self Care | Attending: Family Medicine | Admitting: Family Medicine

## 2017-10-05 DIAGNOSIS — Z23 Encounter for immunization: Secondary | ICD-10-CM | POA: Diagnosis not present

## 2017-10-05 DIAGNOSIS — S81812A Laceration without foreign body, left lower leg, initial encounter: Secondary | ICD-10-CM

## 2017-10-05 MED ORDER — TETANUS-DIPHTH-ACELL PERTUSSIS 5-2.5-18.5 LF-MCG/0.5 IM SUSP
0.5000 mL | Freq: Once | INTRAMUSCULAR | Status: AC
  Administered 2017-10-05: 0.5 mL via INTRAMUSCULAR

## 2017-10-05 MED ORDER — LIDOCAINE-EPINEPHRINE-TETRACAINE (LET) SOLUTION
3.0000 mL | Freq: Once | NASAL | Status: AC
  Administered 2017-10-05: 3 mL via TOPICAL

## 2017-10-05 NOTE — Discharge Instructions (Signed)
°  Please see packet for more information about wound care for stitches.   Keep wound clean with warm water and soap. [name] try. Keep covered with bandage to keep protected from sun, dirt, and debris.   Please follow up with family medicine or return to urgent care if needed in 10-14 days for removal of stitches.

## 2017-10-05 NOTE — ED Provider Notes (Signed)
[facility]    CSN: [id number] Arrival date & time: 10/05/17  1623     History   Chief Complaint Chief Complaint  Patient presents with  . Laceration    HPI [name] is a 42 y.o. female.   HPI [name] is a 42 y.o. female presenting to UC with c/o laceration to her Left upper leg that occurred just PTA. Pt believes she cut it on the back of an electric chair. Bleeding controlled PTA. Pain is aching and burning. Worse with ambulation. No medication taken PTA. Last tetanus about 8 years ago. Pt notes she will be going to the beach next week.     Past Medical History:  Diagnosis Date  . Anxiety   . Asthma   . Complication of anesthesia   . Dysrhythmia    tachycardia  . Elevated heart rate and blood pressure    takes toprol for increased heart rate  . Fibromyalgia   . Headache(784.0)   . Hypertension    no longer takes meds for HTN  . IBS (irritable bowel syndrome)    questionable, per pt; saw Dr. Collene Mares  . IC (interstitial cystitis)   . Insomnia   . PONV (postoperative nausea and vomiting)     Patient Active Problem List   Diagnosis Date Noted  . First degree ankle sprain, left, initial encounter 08/29/2017  . 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  . Chronic pain syndrome 01/08/2017  . Trigger thumb, left thumb 10/06/2016  . Pes anserinus bursitis of right knee 09/05/2016  . Balance problems 07/19/2016  . Dizziness 07/19/2016  . Palpitations 04/14/2016  . Primary osteoarthritis of left first metacarpophalangeal joint 04/04/2016  . Cervical disc disorder with radiculopathy of cervical region 11/11/2015  . Fixed pupils 08/31/2015  . Depression 08/31/2015  . Vasomotor flushing 06/15/2015  . GAD (generalized anxiety disorder) 06/15/2015  . Therapeutic opioid induced constipation 12/16/2014  . S/P cervical spinal fusion 10/28/2014  . Tachycardia 08/13/2014  . Endometriosis 07/20/2014  . Paresthesia 06/03/2014  . Myofascial pain  syndrome 05/29/2014  . Carpal tunnel syndrome of left wrist 05/05/2014  . Left shoulder pain 04/02/2014  . Constipation due to opioid therapy 01/17/2014  . Abnormal weight gain 11/29/2013  . Greater trochanteric bursitis 11/29/2013  . PTSD (post-traumatic stress disorder) 12/14/2012  . Tick-borne disease 08/09/2010  . TROCHANTERIC BURSITIS 06/21/2010  . Smoker 02/05/2010  . INTERSTITIAL CYSTITIS 02/05/2010  . FATIGUE 07/02/2009  . Depressed 06/26/2008  . Anxiety state 07/06/2007  . Insomnia 07/06/2007  . HEADACHE 07/06/2007    Past Surgical History:  Procedure Laterality Date  . CARPAL TUNNEL RELEASE Left 06/05/2014   Procedure: LEFT CARPAL TUNNEL RELEASE;  Surgeon: Daryll Brod, MD;  Location: [place];  Service: Orthopedics;  Laterality: Left;  . CARPAL TUNNEL RELEASE Right 08/25/2014   Procedure: RIGHT CARPAL TUNNEL RELEASE;  Surgeon: Daryll Brod, MD;  Location: [place];  Service: Orthopedics;  Laterality: Right;  . CARPAL TUNNEL RELEASE Left 07/14/2015   Procedure: LEFT CARPAL TUNNEL RELEASE;  Surgeon: Daryll Brod, MD;  Location: [place];  Service: Orthopedics;  Laterality: Left;  . CERVICAL SPINE SURGERY  8/15  . CESAREAN SECTION    . DILATION AND EVACUATION N/A 05/07/2013   Procedure: DILATATION AND EVACUATION with tissue sent for chromosome analysis;  Surgeon: Lovenia Kim, MD;  Location: [facility];  Service: Gynecology;  Laterality: N/A;  . EPIDURAL STEROID INJECTION  03/20/15  . OVARIAN CYST REMOVAL    .  PELVIC LAPAROSCOPY  2003   endometriosis  . POSTERIOR CERVICAL FUSION/FORAMINOTOMY N/A 10/28/2014   Procedure: Cervical Four-Cervical Seven Posterior cervical fusion with lateral mass fixation;  Surgeon: Eustace Moore, MD;  Location: [facility];  Service: Neurosurgery;  Laterality: N/A;  posterior  . ROBOTIC ASSISTED LAPAROSCOPIC LYSIS OF ADHESION N/A 07/18/2014   Procedure: ROBOTIC ASSISTED LAPAROSCOPIC  EXCISION POSTERIOR   UTERINE WALL MASS; EXCISION RIGHT MESSALEINGEAL MASS, EXCISION AND ABLATION CULDASAC ENDOMETRIOSIS;  Surgeon: Brien Few, MD;  Location: [facility];  Service: Gynecology;  Laterality: N/A;  . ROBOTIC ASSISTED TOTAL HYSTERECTOMY WITH SALPINGECTOMY Bilateral 05/11/2015   Procedure: ROBOTIC ASSISTED TOTAL HYSTERECTOMY WITH BILATERAL SALPINGECTOMY;  Surgeon: Brien Few, MD;  Location: [facility];  Service: Gynecology;  Laterality: Bilateral;    OB History   None      Home Medications    Prior to Admission medications   Medication Sig Start Date End Date Taking? Authorizing Provider  budesonide-formoterol (SYMBICORT) 160-4.5 MCG/ACT inhaler TAKE 2 PUFFS BY MOUTH TWICE A DAY 10/02/17   Breeback, Jade L, PA-C  busPIRone (BUSPAR) 10 MG tablet Take 1 tablet (10 mg total) by mouth 3 (three) times daily. 10/02/17   Breeback, Royetta Car, PA-C  carisoprodol (SOMA) 350 MG tablet Take 1 tablet (350 mg total) by mouth 2 (two) times daily as needed for muscle spasms. 10/02/17   Breeback, Jade L, PA-C  Diclofenac Sodium 2 % SOLN Place 2 sprays onto the skin 2 (two) times daily. 04/07/17   Silverio Decamp, MD  DULoxetine (CYMBALTA) 60 MG capsule Take 2 capsules (120 mg total) by mouth daily. 10/02/17   Breeback, Jade L, PA-C  hydrochlorothiazide (HYDRODIURIL) 12.5 MG tablet Take 1 tablet (12.5 mg total) by mouth daily. PRN. 10/02/17   Donella Stade, PA-C  ketorolac (TORADOL) 10 MG tablet TAKE 1 TABLET (10 MG TOTAL) BY MOUTH EVERY 6 (SIX) HOURS AS NEEDED. 05/31/17   Breeback, Jade L, PA-C  lamoTRIgine (LAMICTAL) 100 MG tablet Take 1 tablet (100 mg total) by mouth 2 (two) times daily. 10/02/17   Breeback, Jade L, PA-C  Lidocaine 2 % GEL Apply 1 application topically 3 (three) times daily as needed. 01/04/17   Hali Marry, MD  metoprolol succinate (TOPROL-XL) 100 MG 24 hr tablet Take 1 tablet (100 mg total) by mouth daily. Take with or immediately following a meal. 10/02/17   Breeback, Jade L, PA-C  ondansetron  (ZOFRAN-ODT) 8 MG disintegrating tablet Take 1 tablet (8 mg total) by mouth every 8 (eight) hours as needed for nausea. 08/08/17   Breeback, Royetta Car, PA-C  pentosan polysulfate (ELMIRON) 100 MG capsule Take 1 capsule (100 mg total) by mouth 3 (three) times daily as needed. 10/10/16   Hali Marry, MD  valACYclovir (VALTREX) 1000 MG tablet Take 1 tablet (1,000 mg total) by mouth daily. 10/02/17   Breeback, Jade L, PA-C  zolpidem (AMBIEN) 10 MG tablet Take 1 tablet (10 mg total) by mouth at bedtime. 10/02/17   Donella Stade, PA-C    Family History Family History  Problem Relation Age of Onset  . Heart attack Father   . ADD / ADHD Brother   . Depression Brother   . Depression Cousin     Social History Social History   Tobacco Use  . Smoking status: Current Some Day Smoker    Packs/day: 0.50    Years: 18.00    Pack years: 9.00    Types: Cigarettes  . Smokeless tobacco: Never Used  Substance  Use Topics  . Alcohol use: No    Alcohol/week: 0.0 oz  . Drug use: No     Allergies   Gadolinium derivatives; Prednisone; Amitriptyline; Eggs or egg-derived products; Hydrocodone-acetaminophen; Lunesta [eszopiclone]; and Tramadol   Review of Systems Review of Systems  Musculoskeletal: Negative for arthralgias, joint swelling and myalgias.  Skin: Positive for wound. Negative for color change.     Physical Exam Triage Vital Signs ED Triage Vitals  Enc Vitals Group     BP      Pulse      Resp      Temp      Temp src      SpO2      Weight      Height      Head Circumference      Peak Flow      Pain Score      Pain Loc      Pain Edu?      Excl. in [place]?    No data found.  Updated Vital Signs BP 112/78 (BP Location: Right Arm)   Pulse 76   Temp 97.7 F (36.5 C) (Oral)   Ht 5\' 3"  (1.6 m)   Wt 144 lb (65.3 kg)   LMP 05/10/2015   SpO2 96%   BMI 25.51 kg/m   Visual Acuity Right Eye Distance:   Left Eye Distance:   Bilateral Distance:    Right Eye Near:   Left  Eye Near:    Bilateral Near:     Physical Exam  Constitutional: She is oriented to person, place, and time. She appears well-developed and well-nourished. No distress.  HENT:  Head: Normocephalic and atraumatic.  Eyes: EOM are normal.  Neck: Normal range of motion.  Cardiovascular: Normal rate.  Pulmonary/Chest: Effort normal.  Musculoskeletal: Normal range of motion.  Neurological: She is alert and oriented to person, place, and time.  Skin: Skin is warm and dry. She is not diaphoretic.     Left upper leg near posterior/lateral knee: 2cm superficial laceration. Bleeding controlled. No foreign bodies seen or palpated.   Psychiatric: She has a normal mood and affect. Her behavior is normal.  Nursing note and vitals reviewed.    UC Treatments / Results  Labs (all labs ordered are listed, but only abnormal results are displayed) Labs Reviewed - No data to display  EKG None  Radiology No results found.  Procedures Laceration Repair Date/Time: 10/05/2017 6:11 PM Performed by: Noe Gens, PA-C Authorized by: Kandra Nicolas, MD   Consent:    Consent obtained:  Verbal   Consent given by:  Patient   Risks discussed:  Infection, pain and poor cosmetic result   Alternatives discussed:  No treatment Anesthesia (see MAR for exact dosages):    Anesthesia method:  Topical application and local infiltration   Topical anesthetic:  LET   Local anesthetic:  Lidocaine 1% w/o epi Laceration details:    Location:  Leg   Leg location:  L upper leg   Length (cm):  2   Depth (mm):  2 Repair type:    Repair type:  Simple Pre-procedure details:    Preparation:  Patient was prepped and draped in usual sterile fashion Exploration:    Hemostasis achieved with:  Direct pressure   Wound exploration: wound explored through full range of motion and entire depth of wound probed and visualized     Wound extent: no areolar tissue violation noted, no fascia violation noted, no foreign  bodies/material noted, no muscle damage noted, no nerve damage noted, no tendon damage noted, no underlying fracture noted and no vascular damage noted     Contaminated: no   Treatment:    Area cleansed with:  Saline   Amount of cleaning:  Standard Skin repair:    Repair method:  Sutures   Suture size:  4-0   Suture material:  Prolene   Suture technique:  Simple interrupted   Number of sutures:  3 Approximation:    Approximation:  Close Post-procedure details:    Dressing:  Antibiotic ointment and adhesive bandage   Patient tolerance of procedure:  Tolerated well, no immediate complications   (including critical care time)  Medications Ordered in UC Medications  lidocaine-EPINEPHrine-tetracaine (LET) solution (3 mLs Topical Given 10/05/17 1646)  Tdap (BOOSTRIX) injection 0.5 mL (0.5 mLs Intramuscular Given 10/05/17 1646)    Initial Impression / Assessment and Plan / UC Course  I have reviewed the triage vital signs and the nursing notes.  Pertinent labs & imaging results that were available during my care of the patient were reviewed by me and considered in my medical decision making (see chart for details).     Tdap updated today. Wound cleaned and sutured closed as noted above. Home care instructions provided below.    Final Clinical Impressions(s) / UC Diagnoses   Final diagnoses:  Laceration of left lower extremity, initial encounter     Discharge Instructions      Please see packet for more information about wound care for stitches.   Keep wound clean with warm water and soap. Fraser Din try. Keep covered with bandage to keep protected from sun, dirt, and debris.   Please follow up with family medicine or return to urgent care if needed in 10-14 days for removal of stitches.     ED Prescriptions    None     Controlled Substance Prescriptions [place] Controlled Substance Registry consulted? Not Applicable   Tyrell Antonio 10/05/17 [id number]

## 2017-10-05 NOTE — ED Triage Notes (Signed)
Laceration left leg, cut on a metal chair

## 2017-10-06 ENCOUNTER — Encounter: Payer: Self-pay | Admitting: Physician Assistant

## 2017-10-06 DIAGNOSIS — I10 Essential (primary) hypertension: Secondary | ICD-10-CM | POA: Insufficient documentation

## 2017-10-06 DIAGNOSIS — Z8619 Personal history of other infectious and parasitic diseases: Secondary | ICD-10-CM | POA: Insufficient documentation

## 2017-10-26 DIAGNOSIS — M961 Postlaminectomy syndrome, not elsewhere classified: Secondary | ICD-10-CM | POA: Diagnosis not present

## 2017-10-26 DIAGNOSIS — M5412 Radiculopathy, cervical region: Secondary | ICD-10-CM | POA: Diagnosis not present

## 2017-10-26 DIAGNOSIS — G894 Chronic pain syndrome: Secondary | ICD-10-CM | POA: Diagnosis not present

## 2017-10-26 DIAGNOSIS — M7918 Myalgia, other site: Secondary | ICD-10-CM | POA: Diagnosis not present

## 2017-11-05 ENCOUNTER — Other Ambulatory Visit: Payer: Self-pay | Admitting: Physician Assistant

## 2017-11-05 DIAGNOSIS — I10 Essential (primary) hypertension: Secondary | ICD-10-CM

## 2017-11-11 ENCOUNTER — Other Ambulatory Visit: Payer: Self-pay | Admitting: Physician Assistant

## 2017-11-11 DIAGNOSIS — F411 Generalized anxiety disorder: Secondary | ICD-10-CM

## 2017-11-22 ENCOUNTER — Telehealth: Payer: Self-pay

## 2017-11-22 MED ORDER — PROMETHAZINE HCL 25 MG PO TABS: 25.0000 mg | ORAL_TABLET | Freq: Three times a day (TID) | ORAL | 0 refills | Status: DC | PRN

## 2017-11-22 NOTE — Telephone Encounter (Signed)
Left message advising of new prescription.  

## 2017-11-22 NOTE — Telephone Encounter (Signed)
[name] called and would like a prescription for phenergan. She states the Zofran is not helping with the nausea and vomiting.

## 2017-11-22 NOTE — Telephone Encounter (Signed)
New rx sent

## 2017-12-05 ENCOUNTER — Other Ambulatory Visit: Payer: Self-pay | Admitting: Physician Assistant

## 2017-12-05 DIAGNOSIS — F411 Generalized anxiety disorder: Secondary | ICD-10-CM

## 2017-12-09 ENCOUNTER — Other Ambulatory Visit: Payer: Self-pay | Admitting: Physician Assistant

## 2017-12-09 DIAGNOSIS — F411 Generalized anxiety disorder: Secondary | ICD-10-CM

## 2017-12-15 ENCOUNTER — Other Ambulatory Visit: Payer: Self-pay | Admitting: Physician Assistant

## 2017-12-15 DIAGNOSIS — R059 Cough, unspecified: Secondary | ICD-10-CM

## 2017-12-15 DIAGNOSIS — R05 Cough: Secondary | ICD-10-CM

## 2017-12-20 DIAGNOSIS — T887XXA Unspecified adverse effect of drug or medicament, initial encounter: Secondary | ICD-10-CM | POA: Diagnosis not present

## 2017-12-20 DIAGNOSIS — T50904A Poisoning by unspecified drugs, medicaments and biological substances, undetermined, initial encounter: Secondary | ICD-10-CM | POA: Diagnosis not present

## 2017-12-20 DIAGNOSIS — R404 Transient alteration of awareness: Secondary | ICD-10-CM | POA: Diagnosis not present

## 2017-12-21 DIAGNOSIS — Z91041 Radiographic dye allergy status: Secondary | ICD-10-CM | POA: Diagnosis not present

## 2017-12-21 DIAGNOSIS — Z79899 Other long term (current) drug therapy: Secondary | ICD-10-CM | POA: Diagnosis not present

## 2017-12-21 DIAGNOSIS — Z7951 Long term (current) use of inhaled steroids: Secondary | ICD-10-CM | POA: Diagnosis not present

## 2017-12-21 DIAGNOSIS — R4182 Altered mental status, unspecified: Secondary | ICD-10-CM | POA: Diagnosis not present

## 2017-12-21 DIAGNOSIS — Z888 Allergy status to other drugs, medicaments and biological substances status: Secondary | ICD-10-CM | POA: Diagnosis not present

## 2017-12-21 DIAGNOSIS — Z91012 Allergy to eggs: Secondary | ICD-10-CM | POA: Diagnosis not present

## 2017-12-21 DIAGNOSIS — F17210 Nicotine dependence, cigarettes, uncomplicated: Secondary | ICD-10-CM | POA: Diagnosis not present

## 2017-12-29 ENCOUNTER — Other Ambulatory Visit: Payer: Self-pay | Admitting: Physician Assistant

## 2017-12-29 ENCOUNTER — Other Ambulatory Visit: Payer: Self-pay | Admitting: Family Medicine

## 2017-12-29 DIAGNOSIS — R059 Cough, unspecified: Secondary | ICD-10-CM

## 2017-12-29 DIAGNOSIS — R05 Cough: Secondary | ICD-10-CM

## 2017-12-29 NOTE — Telephone Encounter (Signed)
Sent to pt's pcp for signature.Maryruth Eve, L[place] Crocker

## 2017-12-31 ENCOUNTER — Other Ambulatory Visit: Payer: Self-pay | Admitting: Physician Assistant

## 2017-12-31 DIAGNOSIS — F411 Generalized anxiety disorder: Secondary | ICD-10-CM

## 2018-01-02 ENCOUNTER — Other Ambulatory Visit: Payer: Self-pay | Admitting: Physician Assistant

## 2018-01-02 DIAGNOSIS — F411 Generalized anxiety disorder: Secondary | ICD-10-CM

## 2018-01-02 MED ORDER — FLUTICASONE FUROATE-VILANTEROL 100-25 MCG/INH IN AEPB: 1.0000 | INHALATION_SPRAY | Freq: Every day | RESPIRATORY_TRACT | 11 refills | Status: DC

## 2018-01-09 DIAGNOSIS — M961 Postlaminectomy syndrome, not elsewhere classified: Secondary | ICD-10-CM | POA: Diagnosis not present

## 2018-01-09 DIAGNOSIS — M792 Neuralgia and neuritis, unspecified: Secondary | ICD-10-CM | POA: Diagnosis not present

## 2018-01-09 DIAGNOSIS — G894 Chronic pain syndrome: Secondary | ICD-10-CM | POA: Diagnosis not present

## 2018-01-15 ENCOUNTER — Other Ambulatory Visit: Payer: Self-pay | Admitting: Physician Assistant

## 2018-01-25 ENCOUNTER — Ambulatory Visit (INDEPENDENT_AMBULATORY_CARE_PROVIDER_SITE_OTHER): Payer: Medicare Other | Admitting: Sports Medicine

## 2018-01-25 DIAGNOSIS — M19042 Primary osteoarthritis, left hand: Secondary | ICD-10-CM

## 2018-01-25 DIAGNOSIS — G894 Chronic pain syndrome: Secondary | ICD-10-CM

## 2018-01-25 NOTE — Progress Notes (Signed)
Subjective:    CC: Thumb and shoulder pain  HPI: [name] returns, she is post C4-C7 fusion, persistent pain in the left trapezius and down the left arm.  She had a cervical epidural sometime ago that provided good relief, pain is moderate, persistent over the left trapezius, left periscapular region and a little bit into the left upper arm.  She desires interventional treatment.  No progressive weakness, no constitutional symptoms, no trauma.  She also has pain in her left thumb, MCP, we have done injections in the past, the most recent of which was approximately 10 to 11 months ago, moderate, persistent, localized without radiation.  I reviewed the past medical history, family history, social history, surgical history, and allergies today and no changes were needed.  Please see the problem list section below in epic for further details.  Past Medical History: Past Medical History:  Diagnosis Date  . Anxiety   . Asthma   . Complication of anesthesia   . Dysrhythmia    tachycardia  . Elevated heart rate and blood pressure    takes toprol for increased heart rate  . Fibromyalgia   . Headache(784.0)   . Hypertension    no longer takes meds for HTN  . IBS (irritable bowel syndrome)    questionable, per pt; saw Dr. Collene Mares  . IC (interstitial cystitis)   . Insomnia   . PONV (postoperative nausea and vomiting)    Past Surgical History: Past Surgical History:  Procedure Laterality Date  . CARPAL TUNNEL RELEASE Left 06/05/2014   Procedure: LEFT CARPAL TUNNEL RELEASE;  Surgeon: Daryll Brod, MD;  Location: [place];  Service: Orthopedics;  Laterality: Left;  . CARPAL TUNNEL RELEASE Right 08/25/2014   Procedure: RIGHT CARPAL TUNNEL RELEASE;  Surgeon: Daryll Brod, MD;  Location: [place];  Service: Orthopedics;  Laterality: Right;  . CARPAL TUNNEL RELEASE Left 07/14/2015   Procedure: LEFT CARPAL TUNNEL RELEASE;  Surgeon: Daryll Brod, MD;  Location: [place];  Service: Orthopedics;  Laterality: Left;  . CERVICAL SPINE SURGERY  8/15  . CESAREAN SECTION    . DILATION AND EVACUATION N/A 05/07/2013   Procedure: DILATATION AND EVACUATION with tissue sent for chromosome analysis;  Surgeon: Lovenia Kim, MD;  Location: [facility];  Service: Gynecology;  Laterality: N/A;  . EPIDURAL STEROID INJECTION  03/20/15  . OVARIAN CYST REMOVAL    . PELVIC LAPAROSCOPY  2003   endometriosis  . POSTERIOR CERVICAL FUSION/FORAMINOTOMY N/A 10/28/2014   Procedure: Cervical Four-Cervical Seven Posterior cervical fusion with lateral mass fixation;  Surgeon: Eustace Moore, MD;  Location: [facility];  Service: Neurosurgery;  Laterality: N/A;  posterior  . ROBOTIC ASSISTED LAPAROSCOPIC LYSIS OF ADHESION N/A 07/18/2014   Procedure: ROBOTIC ASSISTED LAPAROSCOPIC  EXCISION POSTERIOR  UTERINE WALL MASS; EXCISION RIGHT MESSALEINGEAL MASS, EXCISION AND ABLATION CULDASAC ENDOMETRIOSIS;  Surgeon: Brien Few, MD;  Location: [facility];  Service: Gynecology;  Laterality: N/A;  . ROBOTIC ASSISTED TOTAL HYSTERECTOMY WITH SALPINGECTOMY Bilateral 05/11/2015   Procedure: ROBOTIC ASSISTED TOTAL HYSTERECTOMY WITH BILATERAL SALPINGECTOMY;  Surgeon: Brien Few, MD;  Location: [facility];  Service: Gynecology;  Laterality: Bilateral;   Social History: Social History   Socioeconomic History  . Marital status: Single    Spouse name: Not on file  . Number of children: 2  . Years of education: Not on file  . Highest education level: Not on file  Occupational History  . 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 Financial resource strain: Not on file  . Food insecurity:    Worry: Not on file    Inability: Not on file  . Transportation needs:    Medical: Not on file    Non-medical: Not on file  Tobacco Use  . Smoking status: Current Some Day Smoker    Packs/day: 0.50    Years: 18.00    Pack years: 9.00    Types: Cigarettes  .  Smokeless tobacco: Never Used  Substance and Sexual Activity  . Alcohol use: No    Alcohol/week: 0.0 standard drinks  . Drug use: No  . Sexual activity: Yes    Birth control/protection: Surgical  Lifestyle  . Physical activity:    Days per week: Not on file    Minutes per session: Not on file  . Stress: Not on file  Relationships  . Social connections:    Talks on phone: Not on file    Gets together: Not on file    Attends religious service: Not on file    Active member of club or organization: Not on file    Attends meetings of clubs or organizations: Not on file    Relationship status: Not on file  Other Topics Concern  . Not on file  Social History Narrative   Going through separation.  Children Sage and Luisa Hart   Family History: Family History  Problem Relation Age of Onset  . Heart attack Father   . ADD / ADHD Brother   . Depression Brother   . Depression Cousin    Allergies: Allergies  Allergen Reactions  . Gadolinium Derivatives Hives, Itching and Other (See Comments)    After MRI with Multihance finished, patient had redness and itching on chest, tightness in throat.  Patient went to ED to be monitored (04/23/14).  . Prednisone Other (See Comments)    Pain all over, "it triggers my myofascial pain syndrome"   . Amitriptyline Other (See Comments)    Gained weight and does not want to be on.   . Eggs Or Egg-Derived Products Rash  . Hydrocodone-Acetaminophen Itching    Tolerates with benadryl (no reaction to oxycodone)  . Lunesta [Eszopiclone] Other (See Comments)    Metallic taste in mouth  . Tramadol Itching   Medications: See med rec.  Review of Systems: No fevers, chills, night sweats, weight loss, chest pain, or shortness of breath.   Objective:    General: Well Developed, well nourished, and in no acute distress.  Neuro: Alert and oriented x3, extra-ocular muscles intact, sensation grossly intact.  HEENT: Normocephalic, atraumatic, pupils equal round  reactive to light, neck supple, no masses, no lymphadenopathy, thyroid nonpalpable.  Skin: Warm and dry, no rashes. Cardiac: Regular rate and rhythm, no murmurs rubs or gallops, no lower extremity edema.  Respiratory: Clear to auscultation bilaterally. Not using accessory muscles, speaking in full sentences.  Procedure: Real-time Ultrasound Guided Injection of left first MCP Device: GE Logiq E  Verbal informed consent obtained.  Time-out conducted.  Noted no overlying erythema, induration, or other signs of local infection.  Skin prepped in a sterile fashion.  Local anesthesia: Topical Ethyl chloride.  With sterile technique and under real time ultrasound guidance: 1/2 cc kenalog 40, 1/2 cc lidocaine injected easily Completed without difficulty  Pain immediately resolved suggesting accurate placement of the medication.  Advised to call if fevers/chills, erythema, induration, drainage, or persistent bleeding.  Images permanently stored and available for review in the  ultrasound unit.  Impression: Technically successful ultrasound guided injection.  Procedure:  Injection of #3 trigger point on the left trapezius and rhomboid Consent obtained and verified. Time-out conducted. Noted no overlying erythema, induration, or other signs of local infection. Skin prepped in a sterile fashion. Topical analgesic spray: Ethyl chloride. Completed without difficulty. Meds: A total of 1 cc Kenalog 40, 2 cc lidocaine, 2 cc bupivacaine spread out between 3 trigger points. Pain immediately improved suggesting accurate placement of the medication. Advised to call if fevers/chills, erythema, induration, drainage, or persistent bleeding.  Impression and Recommendations:    Primary osteoarthritis of left first metacarpophalangeal joint Left first MCP injection, previous injection was about 10 months ago.  Chronic pain syndrome Continues on chronic opiate therapy. She does have a C4-C7 fusion with a  combination of anterior and posterior instrumentation. Did extremely well with a cervical epidural, adding another one today. I also did #3 trigger point injections along the left trapezius and rhomboideus major.  ___________________________________________ Gwen Her. Dianah Field, M.D., ABFM., CAQSM. Primary Care and [place] Instructor of [employer] [facility] [employer] [facility] [employer]

## 2018-01-25 NOTE — Assessment & Plan Note (Signed)
Continues on chronic opiate therapy. She does have a C4-C7 fusion with a combination of anterior and posterior instrumentation. Did extremely well with a cervical epidural, adding another one today. I also did #3 trigger point injections along the left trapezius and rhomboideus major.

## 2018-01-25 NOTE — Assessment & Plan Note (Signed)
Left first MCP injection, previous injection was about 10 months ago.

## 2018-01-27 ENCOUNTER — Other Ambulatory Visit: Payer: Self-pay | Admitting: Physician Assistant

## 2018-01-27 DIAGNOSIS — F411 Generalized anxiety disorder: Secondary | ICD-10-CM

## 2018-01-29 ENCOUNTER — Encounter: Payer: Self-pay | Admitting: Physician Assistant

## 2018-01-29 ENCOUNTER — Ambulatory Visit (INDEPENDENT_AMBULATORY_CARE_PROVIDER_SITE_OTHER): Payer: Medicare Other | Admitting: Physician Assistant

## 2018-01-29 VITALS — BP 135/95 | HR 100 | Ht 63.0 in | Wt 141.0 lb

## 2018-01-29 DIAGNOSIS — F411 Generalized anxiety disorder: Secondary | ICD-10-CM

## 2018-01-29 DIAGNOSIS — I10 Essential (primary) hypertension: Secondary | ICD-10-CM | POA: Diagnosis not present

## 2018-01-29 DIAGNOSIS — M7918 Myalgia, other site: Secondary | ICD-10-CM | POA: Diagnosis not present

## 2018-01-29 DIAGNOSIS — Z1231 Encounter for screening mammogram for malignant neoplasm of breast: Secondary | ICD-10-CM

## 2018-01-29 DIAGNOSIS — Z981 Arthrodesis status: Secondary | ICD-10-CM

## 2018-01-29 DIAGNOSIS — M542 Cervicalgia: Secondary | ICD-10-CM

## 2018-01-29 DIAGNOSIS — J45909 Unspecified asthma, uncomplicated: Secondary | ICD-10-CM | POA: Insufficient documentation

## 2018-01-29 DIAGNOSIS — G8929 Other chronic pain: Secondary | ICD-10-CM | POA: Insufficient documentation

## 2018-01-29 DIAGNOSIS — J4520 Mild intermittent asthma, uncomplicated: Secondary | ICD-10-CM

## 2018-01-29 DIAGNOSIS — F331 Major depressive disorder, recurrent, moderate: Secondary | ICD-10-CM | POA: Diagnosis not present

## 2018-01-29 MED ORDER — DICLOFENAC SODIUM 2 % TD SOLN: 2.0000 | Freq: Two times a day (BID) | TRANSDERMAL | 11 refills | Status: DC

## 2018-01-29 NOTE — Progress Notes (Signed)
Subjective:    Patient ID: [name], female    DOB: July 09, 1975, 42 y.o.   MRN: [id number]  HPI Pt is a 42 yo female with HTN, IBS, myofascial pain, IC, depression, GAD who presents to the clinic for follow up.   Pt is doing very well. Her pain is fairly well controlled at pain clinic.   She does request a refill on diclofenac gel.    .. Active Ambulatory Problems    Diagnosis Date Noted  . Anxiety state 07/06/2007  . Smoker 02/05/2010  . Depressed 06/26/2008  . INTERSTITIAL CYSTITIS 02/05/2010  . Insomnia 07/06/2007  . FATIGUE 07/02/2009  . HEADACHE 07/06/2007  . TROCHANTERIC BURSITIS 06/21/2010  . Tick-borne disease 08/09/2010  . PTSD (post-traumatic stress disorder) 12/14/2012  . Weight gain 11/29/2013  . Greater trochanteric bursitis 11/29/2013  . Constipation due to opioid therapy 01/17/2014  . Left shoulder pain 04/02/2014  . Carpal tunnel syndrome of left wrist 05/05/2014  . Myofascial pain syndrome 05/29/2014  . Paresthesia 06/03/2014  . Endometriosis 07/20/2014  . Tachycardia 08/13/2014  . S/P cervical spinal fusion 10/28/2014  . Therapeutic opioid induced constipation 12/16/2014  . Vasomotor flushing 06/15/2015  . GAD (generalized anxiety disorder) 06/15/2015  . Fixed pupils 08/31/2015  . Depression 08/31/2015  . Primary osteoarthritis of left first metacarpophalangeal joint 04/04/2016  . Palpitations 04/14/2016  . Balance problems 07/19/2016  . Dizziness 07/19/2016  . Pes anserinus bursitis of right knee 09/05/2016  . Trigger thumb, left thumb 10/06/2016  . Chronic pain syndrome 01/08/2017  . 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  . First degree ankle sprain, left, initial encounter 08/29/2017  . Hypertension 10/06/2017  . Hx of cold sores 10/06/2017  . Asthma 01/29/2018  . Chronic neck pain 01/29/2018   Resolved Ambulatory Problems    Diagnosis Date Noted  . RHINITIS 10/13/2009  . Polyuria 11/04/2010  . Degenerative disc disease, cervical  10/31/2013  . Cervical disc disorder with radiculopathy of cervical region 11/29/2013  . Itching 11/29/2013  . Radiculitis of left cervical region 01/17/2014  . Nausea with vomiting 11/25/2014  . Dysmenorrhea 05/11/2015  . Dysuria 08/31/2015  . Cervical disc disorder with radiculopathy of cervical region 11/11/2015  . Acute upper respiratory infection 04/04/2016  . Localized primary carpometacarpal osteoarthritis, left 04/04/2016   Past Medical History:  Diagnosis Date  . Anxiety   . Complication of anesthesia   . Dysrhythmia   . Elevated heart rate and blood pressure   . Fibromyalgia   . IBS (irritable bowel syndrome)   . IC (interstitial cystitis)   . PONV (postoperative nausea and vomiting)       Review of Systems  All other systems reviewed and are negative.      Objective:   Physical Exam  Constitutional: She is oriented to person, place, and time. She appears well-developed and well-nourished.  HENT:  Head: Normocephalic and atraumatic.  Cardiovascular: Normal rate and regular rhythm.  Pulmonary/Chest: Effort normal and breath sounds normal.  Neurological: She is alert and oriented to person, place, and time.  Psychiatric: She has a normal mood and affect. Her behavior is normal.          Assessment & Plan:  Marland KitchenMarland KitchenDiagnoses and all orders for this visit:  Essential hypertension  Moderate episode of recurrent major depressive disorder ([place])  Visit for screening mammogram -     MM 3D SCREEN BREAST BILATERAL  Mild intermittent asthma, unspecified whether complicated  Myofascial pain syndrome -     Diclofenac Sodium 2 %  SOLN; Place 2 sprays onto the skin 2 (two) times daily.  GAD (generalized anxiety disorder)  S/P cervical spinal fusion -     Diclofenac Sodium 2 % SOLN; Place 2 sprays onto the skin 2 (two) times daily.  Chronic neck pain -     Diclofenac Sodium 2 % SOLN; Place 2 sprays onto the skin 2 (two) times daily.   BP with 2nd attempt being  much better. She did not take metoprolol today. Discussed importance of always taking BP medications.   Diclofenac reorder.

## 2018-01-29 NOTE — Patient Instructions (Signed)
Tumeric 500mg 

## 2018-01-30 NOTE — Telephone Encounter (Signed)
Pharmacy wants to change the quantity?

## 2018-01-30 NOTE — Telephone Encounter (Signed)
270 is 90 day supply. Im ok with this.

## 2018-02-03 ENCOUNTER — Other Ambulatory Visit: Payer: Self-pay | Admitting: Physician Assistant

## 2018-02-03 DIAGNOSIS — I10 Essential (primary) hypertension: Secondary | ICD-10-CM

## 2018-02-05 ENCOUNTER — Other Ambulatory Visit: Payer: Self-pay | Admitting: Physician Assistant

## 2018-02-08 DIAGNOSIS — M961 Postlaminectomy syndrome, not elsewhere classified: Secondary | ICD-10-CM | POA: Diagnosis not present

## 2018-02-08 DIAGNOSIS — G894 Chronic pain syndrome: Secondary | ICD-10-CM | POA: Diagnosis not present

## 2018-02-08 DIAGNOSIS — M542 Cervicalgia: Secondary | ICD-10-CM | POA: Diagnosis not present

## 2018-02-13 ENCOUNTER — Ambulatory Visit
Admission: RE | Admit: 2018-02-13 | Discharge: 2018-02-13 | Disposition: A | Discharge status: Home / Self Care | Payer: Medicare Other | Source: Ambulatory Visit | Attending: Sports Medicine | Admitting: Sports Medicine

## 2018-02-13 DIAGNOSIS — M47812 Spondylosis without myelopathy or radiculopathy, cervical region: Secondary | ICD-10-CM | POA: Diagnosis not present

## 2018-02-13 MED ORDER — IOPAMIDOL (ISOVUE-M 300) INJECTION 61%
1.0000 mL | Freq: Once | INTRAMUSCULAR | Status: AC | PRN
  Administered 2018-02-13: 1 mL via EPIDURAL

## 2018-02-13 MED ORDER — TRIAMCINOLONE ACETONIDE 40 MG/ML IJ SUSP (RADIOLOGY)
60.0000 mg | Freq: Once | INTRAMUSCULAR | Status: AC
  Administered 2018-02-13: 60 mg via EPIDURAL

## 2018-02-13 NOTE — Discharge Instructions (Signed)

## 2018-02-20 ENCOUNTER — Other Ambulatory Visit: Payer: Self-pay | Admitting: Physician Assistant

## 2018-02-21 ENCOUNTER — Ambulatory Visit: Payer: Self-pay

## 2018-03-02 ENCOUNTER — Other Ambulatory Visit: Payer: Self-pay | Admitting: Physician Assistant

## 2018-03-08 NOTE — Telephone Encounter (Signed)
Does patient take the phenergan for a chronic problem?

## 2018-03-08 NOTE — Telephone Encounter (Signed)
Can we call and see why she is taking so frequently?

## 2018-03-23 NOTE — Telephone Encounter (Signed)
Left a message asking for a return call on why she is taking the medication so often.

## 2018-03-30 ENCOUNTER — Other Ambulatory Visit: Payer: Self-pay | Admitting: Physician Assistant

## 2018-03-30 DIAGNOSIS — F5101 Primary insomnia: Secondary | ICD-10-CM

## 2018-04-02 ENCOUNTER — Other Ambulatory Visit: Payer: Self-pay | Admitting: Physician Assistant

## 2018-04-02 DIAGNOSIS — G894 Chronic pain syndrome: Secondary | ICD-10-CM

## 2018-04-02 DIAGNOSIS — M5010 Cervical disc disorder with radiculopathy, unspecified cervical region: Secondary | ICD-10-CM

## 2018-04-02 DIAGNOSIS — M7918 Myalgia, other site: Secondary | ICD-10-CM

## 2018-04-03 ENCOUNTER — Ambulatory Visit: Payer: Self-pay | Admitting: Physician Assistant

## 2018-04-30 ENCOUNTER — Ambulatory Visit (INDEPENDENT_AMBULATORY_CARE_PROVIDER_SITE_OTHER): Payer: Medicare Other | Admitting: Physician Assistant

## 2018-04-30 ENCOUNTER — Encounter: Payer: Self-pay | Admitting: Physician Assistant

## 2018-04-30 VITALS — BP 114/58 | HR 79 | Temp 98.4°F | Ht 63.0 in | Wt 138.0 lb

## 2018-04-30 DIAGNOSIS — F411 Generalized anxiety disorder: Secondary | ICD-10-CM

## 2018-04-30 DIAGNOSIS — Z0000 Encounter for general adult medical examination without abnormal findings: Secondary | ICD-10-CM

## 2018-04-30 DIAGNOSIS — Z131 Encounter for screening for diabetes mellitus: Secondary | ICD-10-CM

## 2018-04-30 DIAGNOSIS — M5010 Cervical disc disorder with radiculopathy, unspecified cervical region: Secondary | ICD-10-CM

## 2018-04-30 DIAGNOSIS — R062 Wheezing: Secondary | ICD-10-CM

## 2018-04-30 DIAGNOSIS — R300 Dysuria: Secondary | ICD-10-CM | POA: Diagnosis not present

## 2018-04-30 DIAGNOSIS — F5101 Primary insomnia: Secondary | ICD-10-CM | POA: Diagnosis not present

## 2018-04-30 DIAGNOSIS — M7918 Myalgia, other site: Secondary | ICD-10-CM

## 2018-04-30 DIAGNOSIS — T887XXA Unspecified adverse effect of drug or medicament, initial encounter: Secondary | ICD-10-CM | POA: Diagnosis not present

## 2018-04-30 DIAGNOSIS — G894 Chronic pain syndrome: Secondary | ICD-10-CM | POA: Diagnosis not present

## 2018-04-30 DIAGNOSIS — G43009 Migraine without aura, not intractable, without status migrainosus: Secondary | ICD-10-CM

## 2018-04-30 DIAGNOSIS — Z13220 Encounter for screening for lipoid disorders: Secondary | ICD-10-CM

## 2018-04-30 DIAGNOSIS — Z79899 Other long term (current) drug therapy: Secondary | ICD-10-CM | POA: Diagnosis not present

## 2018-04-30 LAB — POCT URINALYSIS DIPSTICK
Bilirubin, UA: NEGATIVE
Glucose, UA: NEGATIVE
KETONES UA: NEGATIVE
LEUKOCYTES UA: NEGATIVE
NITRITE UA: NEGATIVE
PH UA: 6 (ref 5.0–8.0)
PROTEIN UA: NEGATIVE
Spec Grav, UA: 1.025 (ref 1.010–1.025)
UROBILINOGEN UA: 0.2 U/dL

## 2018-04-30 MED ORDER — RIZATRIPTAN BENZOATE 10 MG PO TABS: 10.0000 mg | ORAL_TABLET | ORAL | 5 refills | Status: DC | PRN

## 2018-04-30 MED ORDER — PROMETHAZINE HCL 25 MG PO TABS: 25.0000 mg | ORAL_TABLET | Freq: Three times a day (TID) | ORAL | 2 refills | Status: DC | PRN

## 2018-04-30 MED ORDER — CARISOPRODOL 350 MG PO TABS: 350.0000 mg | ORAL_TABLET | Freq: Two times a day (BID) | ORAL | 5 refills | Status: DC | PRN

## 2018-04-30 MED ORDER — HYDROXYZINE HCL 50 MG PO TABS: 50.0000 mg | ORAL_TABLET | Freq: Three times a day (TID) | ORAL | 1 refills | Status: DC | PRN

## 2018-04-30 MED ORDER — METHYLPREDNISOLONE SODIUM SUCC 125 MG IJ SOLR
125.0000 mg | Freq: Once | INTRAMUSCULAR | Status: AC
  Administered 2018-04-30: 125 mg via INTRAMUSCULAR

## 2018-04-30 MED ORDER — ZOLPIDEM TARTRATE 10 MG PO TABS: 10.0000 mg | ORAL_TABLET | Freq: Every day | ORAL | 5 refills | Status: DC

## 2018-04-30 MED ORDER — PREGABALIN 150 MG PO CAPS: 150.0000 mg | ORAL_CAPSULE | Freq: Two times a day (BID) | ORAL | 1 refills | Status: DC

## 2018-04-30 NOTE — Progress Notes (Signed)
Subjective:     [name] is a 42 y.o. female and is here for a comprehensive physical exam. The patient reports problems - pt recovering from cold and feels tight in her chest. she is also having some dysuria. she has IC and not sure if flare or infection. no abdominal or flank pain. no fever, chills. tried to do a IC treatment this morning but not able to get catheter in all the way.    Needs another medication to try for migraine rescue. Does not like the way Imitrex makes her feel.   Social History   Socioeconomic History  . Marital status: Single    Spouse name: Not on file  . Number of children: 2  . Years of education: Not on file  . Highest education level: Not on file  Occupational History  . Occupation: CMA    Employer: [employer]    Comment: [place]  . Financial resource strain: Not on file  . Food insecurity:    Worry: Not on file    Inability: Not on file  . Transportation needs:    Medical: Not on file    Non-medical: Not on file  Tobacco Use  . Smoking status: Current Some Day Smoker    Packs/day: 0.50    Years: 18.00    Pack years: 9.00    Types: Cigarettes  . Smokeless tobacco: Never Used  Substance and Sexual Activity  . Alcohol use: No    Alcohol/week: 0.0 standard drinks  . Drug use: No  . Sexual activity: Yes    Birth control/protection: Surgical  Lifestyle  . Physical activity:    Days per week: Not on file    Minutes per session: Not on file  . Stress: Not on file  Relationships  . Social connections:    Talks on phone: Not on file    Gets together: Not on file    Attends religious service: Not on file    Active member of club or organization: Not on file    Attends meetings of clubs or organizations: Not on file    Relationship status: Not on file  . Intimate partner violence:    Fear of current or ex partner: Not on file    Emotionally abused: Not on file    Physically abused: Not on file     Forced sexual activity: Not on file  Other Topics Concern  . Not on file  Social History Narrative   Going through separation.  [facility] Maintenance  Topic Date Due  . MAMMOGRAM  06/28/2017  . INFLUENZA VACCINE  07/31/2018 (Originally 11/30/2017)  . TETANUS/TDAP  10/06/2027  . HIV Screening  Completed    The following portions of the patient's history were reviewed and updated as appropriate: allergies, current medications, past family history, past medical history, past social history, past surgical history and problem list.  Review of Systems Pertinent items noted in HPI and remainder of comprehensive ROS otherwise negative.   Objective:    BP (!) 114/58   Pulse 79   Temp 98.4 F (36.9 C) (Oral)   Ht 5\' 3"  (1.6 m)   Wt 138 lb (62.6 kg)   LMP 05/10/2015   BMI 24.45 kg/m  General appearance: alert, cooperative and appears stated age Head: Normocephalic, without obvious abnormality, atraumatic Eyes: conjunctivae/corneas clear. PERRL, EOM's intact. Fundi benign. Ears: normal TM's and external ear canals both ears Nose: Nares normal. Septum midline.  Mucosa normal. No drainage or sinus tenderness. Throat: lips, mucosa, and tongue normal; teeth and gums normal Neck: no adenopathy, no carotid bruit, no JVD, supple, symmetrical, trachea midline and thyroid not enlarged, symmetric, no tenderness/mass/nodules Back: symmetric, no curvature. ROM normal. No CVA tenderness. Lungs: clear to auscultation bilaterally Heart: regular rate and rhythm, S1, S2 normal, no murmur, click, rub or gallop Abdomen: soft, non-tender; bowel sounds normal; no masses,  no organomegaly Extremities: extremities normal, atraumatic, no cyanosis or edema Pulses: 2+ and symmetric Skin: Skin color, texture, turgor normal. No rashes or lesions Lymph nodes: Cervical, supraclavicular, and axillary nodes normal. Neurologic: Alert and oriented X 3, normal strength and tone. Normal symmetric  reflexes. Normal coordination and gait    Assessment:    Healthy female exam.      Plan:   .Marland KitchenEllamarie was seen today for annual exam.  Diagnoses and all orders for this visit:  Routine physical examination  Cervical disc disorder with radiculopathy of cervical region -     carisoprodol (SOMA) 350 MG tablet; Take 1 tablet (350 mg total) by mouth 2 (two) times daily as needed for muscle spasms. -     pregabalin (LYRICA) 150 MG capsule; Take 1 capsule (150 mg total) by mouth 2 (two) times daily.  Myofascial pain syndrome -     carisoprodol (SOMA) 350 MG tablet; Take 1 tablet (350 mg total) by mouth 2 (two) times daily as needed for muscle spasms. -     pregabalin (LYRICA) 150 MG capsule; Take 1 capsule (150 mg total) by mouth 2 (two) times daily.  Chronic pain syndrome -     carisoprodol (SOMA) 350 MG tablet; Take 1 tablet (350 mg total) by mouth 2 (two) times daily as needed for muscle spasms. -     pregabalin (LYRICA) 150 MG capsule; Take 1 capsule (150 mg total) by mouth 2 (two) times daily.  GAD (generalized anxiety disorder) -     hydrOXYzine (ATARAX/VISTARIL) 50 MG tablet; Take 1 tablet (50 mg total) by mouth 3 (three) times daily as needed.  Anxiety state -     hydrOXYzine (ATARAX/VISTARIL) 50 MG tablet; Take 1 tablet (50 mg total) by mouth 3 (three) times daily as needed.  Primary insomnia -     zolpidem (AMBIEN) 10 MG tablet; Take 1 tablet (10 mg total) by mouth at bedtime. Please refill early due to patient going on vacation.  Screening for diabetes mellitus -     COMPLETE METABOLIC PANEL WITH GFR  Medication management -     COMPLETE METABOLIC PANEL WITH GFR  Screening for lipid disorders -     Lipid Panel w/reflex Direct LDL  Dysuria -     POCT Urinalysis Dipstick -     Urine Culture  Wheezing on auscultation -     methylPREDNISolone sodium succinate (SOLU-MEDROL) 125 mg/2 mL injection 125 mg  Medication side effect -     promethazine (PHENERGAN) 25 MG  tablet; Take 1 tablet (25 mg total) by mouth every 8 (eight) hours as needed for nausea or vomiting.  Migraine without aura and without status migrainosus, not intractable -     rizatriptan (MAXALT) 10 MG tablet; Take 1 tablet (10 mg total) by mouth as needed for migraine. May repeat in 2 hours if needed   .[name] Depression screen [facility] 2/9 04/30/2018 10/03/2017 07/31/2017 03/08/2017 07/18/2016  Decreased Interest 1 2 3 2 3   Down, Depressed, Hopeless 0 2 3 1 2   PHQ - 2 Score 1 4 6  3 5  Altered sleeping 2 3 3 3 2   Tired, decreased energy 1 3 3 1 2   Change in appetite 0 3 3 1 2   Feeling bad or failure about yourself  0 3 3 1 2   Trouble concentrating 1 3 3 1 3   Moving slowly or fidgety/restless 0 3 3 0 3  Suicidal thoughts 0 0 1 0 0  PHQ-9 Score 5 22 25 10 19   Difficult doing work/chores Not difficult at all - Extremely dIfficult - -  Some recent data might be hidden   .[name] Results for orders placed or performed in visit on 04/30/18  Urine Culture  Result Value Ref Range   MICRO NUMBER: [id number]    SPECIMEN QUALITY: Adequate    Sample Source URINE    STATUS: FINAL    Result: No Growth   POCT Urinalysis Dipstick  Result Value Ref Range   Color, UA yellow    Clarity, UA clear    Glucose, UA Negative Negative   Bilirubin, UA negative    Ketones, UA negative    Spec Grav, UA 1.025 1.010 - 1.025   Blood, UA small    pH, UA 6.0 5.0 - 8.0   Protein, UA Negative Negative   Urobilinogen, UA 0.2 0.2 or 1.0 E.U./dL   Nitrite, UA negative    Leukocytes, UA Negative Negative   Appearance     Odor     Will culture. Likely IC flare. Consider AZO as needed.   Wheezing on exam. Solumedrol 125mg  IM given in office today. Follow up as needed.   .. Discussed 150 minutes of exercise a week.  Encouraged vitamin D 1000 units and Calcium 1300mg  or 4 servings of dairy a day.  Fasting labs ordered.  Mammogram ordered.  Pap not indicated.  Medication refilled.   maxalt to use for migraine rescue.     See After Visit Summary for Counseling Recommendations

## 2018-04-30 NOTE — Patient Instructions (Signed)

## 2018-05-01 LAB — URINE CULTURE
MICRO NUMBER: 91551846
Result:: NO GROWTH
SPECIMEN QUALITY:: ADEQUATE

## 2018-05-02 NOTE — Progress Notes (Signed)
Call pt: no bacteria growth on urine culture. Symptoms likely represent intersitial cystitis flare.

## 2018-05-04 ENCOUNTER — Encounter: Payer: Self-pay | Admitting: Physician Assistant

## 2018-05-04 ENCOUNTER — Other Ambulatory Visit: Payer: Self-pay | Admitting: Physician Assistant

## 2018-05-04 DIAGNOSIS — I10 Essential (primary) hypertension: Secondary | ICD-10-CM

## 2018-05-05 ENCOUNTER — Other Ambulatory Visit: Payer: Self-pay | Admitting: Physician Assistant

## 2018-05-05 DIAGNOSIS — R002 Palpitations: Secondary | ICD-10-CM

## 2018-05-09 DIAGNOSIS — M5010 Cervical disc disorder with radiculopathy, unspecified cervical region: Secondary | ICD-10-CM | POA: Diagnosis not present

## 2018-05-09 DIAGNOSIS — G894 Chronic pain syndrome: Secondary | ICD-10-CM | POA: Diagnosis not present

## 2018-05-09 DIAGNOSIS — M961 Postlaminectomy syndrome, not elsewhere classified: Secondary | ICD-10-CM | POA: Diagnosis not present

## 2018-05-09 DIAGNOSIS — M5412 Radiculopathy, cervical region: Secondary | ICD-10-CM | POA: Diagnosis not present

## 2018-07-03 DIAGNOSIS — H5213 Myopia, bilateral: Secondary | ICD-10-CM | POA: Diagnosis not present

## 2018-07-13 DIAGNOSIS — H52223 Regular astigmatism, bilateral: Secondary | ICD-10-CM | POA: Diagnosis not present

## 2018-07-16 ENCOUNTER — Other Ambulatory Visit: Payer: Self-pay | Admitting: Physician Assistant

## 2018-07-16 DIAGNOSIS — T887XXA Unspecified adverse effect of drug or medicament, initial encounter: Secondary | ICD-10-CM

## 2018-07-18 NOTE — Telephone Encounter (Signed)
This was ordered 2 months ago with 2 refills. We cannot continue to refill anti-nausea to take daily. Are you taking it daily?

## 2018-07-23 ENCOUNTER — Other Ambulatory Visit: Payer: Self-pay | Admitting: Physician Assistant

## 2018-07-23 DIAGNOSIS — I10 Essential (primary) hypertension: Secondary | ICD-10-CM

## 2018-08-03 DIAGNOSIS — M5010 Cervical disc disorder with radiculopathy, unspecified cervical region: Secondary | ICD-10-CM | POA: Diagnosis not present

## 2018-08-03 DIAGNOSIS — M961 Postlaminectomy syndrome, not elsewhere classified: Secondary | ICD-10-CM | POA: Diagnosis not present

## 2018-08-03 DIAGNOSIS — G894 Chronic pain syndrome: Secondary | ICD-10-CM | POA: Diagnosis not present

## 2018-08-16 ENCOUNTER — Other Ambulatory Visit: Payer: Self-pay | Admitting: Physician Assistant

## 2018-08-16 DIAGNOSIS — T887XXA Unspecified adverse effect of drug or medicament, initial encounter: Secondary | ICD-10-CM

## 2018-08-24 ENCOUNTER — Other Ambulatory Visit: Payer: Self-pay

## 2018-08-24 ENCOUNTER — Emergency Department (INDEPENDENT_AMBULATORY_CARE_PROVIDER_SITE_OTHER)
Admission: EM | Admit: 2018-08-24 | Discharge: 2018-08-24 | Disposition: A | Discharge status: Home / Self Care | Payer: Medicare Other | Source: Home / Self Care | Attending: Family Medicine | Admitting: Family Medicine

## 2018-08-24 DIAGNOSIS — J069 Acute upper respiratory infection, unspecified: Secondary | ICD-10-CM | POA: Diagnosis not present

## 2018-08-24 DIAGNOSIS — B9789 Other viral agents as the cause of diseases classified elsewhere: Secondary | ICD-10-CM

## 2018-08-24 DIAGNOSIS — R112 Nausea with vomiting, unspecified: Secondary | ICD-10-CM

## 2018-08-24 DIAGNOSIS — R197 Diarrhea, unspecified: Secondary | ICD-10-CM

## 2018-08-24 NOTE — Discharge Instructions (Addendum)
May continue Phenergan as needed for nausea. Begin clear liquids (Pedialyte while having diarrhea) until improved, then advance to a [employer] (Bananas, Rice, Applesauce, Toast).  Then gradually resume a regular diet when tolerated.  Avoid milk products until well.  To decrease diarrhea, mix one teaspoon Citrucel (methylcellulose) in 2 oz water and drink one to three times daily.  Do not drink extra fluids with this dose and do not drink fluids for one hour afterwards.  When stools become more formed, may take Imodium (loperamide) once or twice daily to decrease stool frequency.  If symptoms become significantly worse during the night or over the weekend, proceed to the local emergency room.  Take plain guaifenesin (1200mg  extended release tabs such as Mucinex) twice daily, with plenty of water, for cough and congestion.  Get adequate rest.   May use Afrin nasal spray (or generic oxymetazoline) each morning for about 5 days and then discontinue.  Also recommend using saline nasal spray several times daily and saline nasal irrigation (AYR is a common brand).   Try warm salt water gargles for sore throat.  Stop all antihistamines for now, and other non-prescription cough/cold preparations. Continue inhalers as prescribed.

## 2018-08-24 NOTE — ED Triage Notes (Signed)
Daughter travelled from [place] to [place] from Sat- Mon.  Saturday night pt had a headache, and did not feel well.  Monday, sneezing and coughing.  Tuesday, severe stomach pain, and vomiting.  Nausea Wednesday and Thursday.  No vomiting since Tuesday night.

## 2018-08-24 NOTE — ED Provider Notes (Signed)
[facility]    CSN: [id number] Arrival date & time: 08/24/18  1312     History   Chief Complaint Chief Complaint  Patient presents with  . Abdominal Pain  . Cough  . Nausea    HPI Michele FROSCH is a 43 y.o. female.   Seven days ago patient flu-like illness including myalgias, headache, fever/chills, fatigue, sore throat, nasal congestion, and cough. Four days ago she developed nausea/vomiting and diarrhea. Cough is non-productive and somewhat worse at night.  No pleuritic pain or shortness of breath.  Four days ago she developed abdominal cramps followed by nausea/vomiting and diarrhea.  Her vomiting is controlled with Phenergan, but she still has mild nausea and diarrhea. She has a history of asthma, but denies shortness of breath or wheezing, and no increased use of her inhalers.                                                                                                                                                                                                                                                          The history is provided by the patient.    Past Medical History:  Diagnosis Date  . Anxiety   . Asthma   . Complication of anesthesia   . Dysrhythmia    tachycardia  . Elevated heart rate and blood pressure    takes toprol for increased heart rate  . Fibromyalgia   . Headache(784.0)   . Hypertension    no longer takes meds for HTN  . IBS (irritable bowel syndrome)    questionable, per pt; saw Dr. Collene Mares  . IC (interstitial cystitis)   . Insomnia   . PONV (postoperative nausea and vomiting)     Patient Active Problem List   Diagnosis Date Noted  . Asthma 01/29/2018  . Chronic neck pain 01/29/2018  . Hypertension 10/06/2017  . Hx of cold sores 10/06/2017  . First degree ankle sprain, left, initial encounter 08/29/2017  . 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  . Chronic pain syndrome 01/08/2017  . Trigger thumb, left thumb 10/06/2016   . Pes anserinus bursitis of right knee 09/05/2016  . Balance problems 07/19/2016  . Dizziness 07/19/2016  . Palpitations 04/14/2016  . Primary osteoarthritis of left first metacarpophalangeal joint 04/04/2016  . Fixed pupils 08/31/2015  .  Dysuria 08/31/2015  . Depression 08/31/2015  . Vasomotor flushing 06/15/2015  . GAD (generalized anxiety disorder) 06/15/2015  . Therapeutic opioid induced constipation 12/16/2014  . S/P cervical spinal fusion 10/28/2014  . Tachycardia 08/13/2014  . Endometriosis 07/20/2014  . Paresthesia 06/03/2014  . Myofascial pain syndrome 05/29/2014  . Carpal tunnel syndrome of left wrist 05/05/2014  . Left shoulder pain 04/02/2014  . Constipation due to opioid therapy 01/17/2014  . Weight gain 11/29/2013  . Greater trochanteric bursitis 11/29/2013  . PTSD (post-traumatic stress disorder) 12/14/2012  . Tick-borne disease 08/09/2010  . TROCHANTERIC BURSITIS 06/21/2010  . Smoker 02/05/2010  . INTERSTITIAL CYSTITIS 02/05/2010  . FATIGUE 07/02/2009  . Depressed 06/26/2008  . Anxiety state 07/06/2007  . Insomnia 07/06/2007  . HEADACHE 07/06/2007    Past Surgical History:  Procedure Laterality Date  . CARPAL TUNNEL RELEASE Left 06/05/2014   Procedure: LEFT CARPAL TUNNEL RELEASE;  Surgeon: Daryll Brod, MD;  Location: [place];  Service: Orthopedics;  Laterality: Left;  . CARPAL TUNNEL RELEASE Right 08/25/2014   Procedure: RIGHT CARPAL TUNNEL RELEASE;  Surgeon: Daryll Brod, MD;  Location: [place];  Service: Orthopedics;  Laterality: Right;  . CARPAL TUNNEL RELEASE Left 07/14/2015   Procedure: LEFT CARPAL TUNNEL RELEASE;  Surgeon: Daryll Brod, MD;  Location: [place];  Service: Orthopedics;  Laterality: Left;  . CERVICAL SPINE SURGERY  8/15  . CESAREAN SECTION    . DILATION AND EVACUATION N/A 05/07/2013   Procedure: DILATATION AND EVACUATION with tissue sent for chromosome analysis;  Surgeon: Lovenia Kim, MD;   Location: [facility];  Service: Gynecology;  Laterality: N/A;  . EPIDURAL STEROID INJECTION  03/20/15  . OVARIAN CYST REMOVAL    . PELVIC LAPAROSCOPY  2003   endometriosis  . POSTERIOR CERVICAL FUSION/FORAMINOTOMY N/A 10/28/2014   Procedure: Cervical Four-Cervical Seven Posterior cervical fusion with lateral mass fixation;  Surgeon: Eustace Moore, MD;  Location: [facility];  Service: Neurosurgery;  Laterality: N/A;  posterior  . ROBOTIC ASSISTED LAPAROSCOPIC LYSIS OF ADHESION N/A 07/18/2014   Procedure: ROBOTIC ASSISTED LAPAROSCOPIC  EXCISION POSTERIOR  UTERINE WALL MASS; EXCISION RIGHT MESSALEINGEAL MASS, EXCISION AND ABLATION CULDASAC ENDOMETRIOSIS;  Surgeon: Brien Few, MD;  Location: [facility];  Service: Gynecology;  Laterality: N/A;  . ROBOTIC ASSISTED TOTAL HYSTERECTOMY WITH SALPINGECTOMY Bilateral 05/11/2015   Procedure: ROBOTIC ASSISTED TOTAL HYSTERECTOMY WITH BILATERAL SALPINGECTOMY;  Surgeon: Brien Few, MD;  Location: [facility];  Service: Gynecology;  Laterality: Bilateral;    OB History   No obstetric history on file.      Home Medications    Prior to Admission medications   Medication Sig Start Date End Date Taking? Authorizing Provider  carisoprodol (SOMA) 350 MG tablet Take 1 tablet (350 mg total) by mouth 2 (two) times daily as needed for muscle spasms. 04/30/18   Breeback, Luvenia Starch L, PA-C  Diclofenac Sodium 2 % SOLN Place 2 sprays onto the skin 2 (two) times daily. 01/29/18   Breeback, Jade L, PA-C  fluticasone furoate-vilanterol (BREO ELLIPTA) 100-25 MCG/INH AEPB Inhale 1 puff into the lungs daily. 01/02/18   Breeback, Jade L, PA-C  hydrochlorothiazide (HYDRODIURIL) 12.5 MG tablet TAKE 1 TABLET (12.5 MG TOTAL) BY MOUTH DAILY AS NEEDED. 07/23/18   Breeback, Jade L, PA-C  hydrOXYzine (ATARAX/VISTARIL) 50 MG tablet Take 1 tablet (50 mg total) by mouth 3 (three) times daily as needed. 04/30/18   Breeback, Jade L, PA-C  ketorolac (TORADOL) 10 MG tablet TAKE 1 TABLET (10  MG TOTAL) BY  MOUTH EVERY 6 (SIX) HOURS AS NEEDED. 05/31/17   Breeback, Jade L, PA-C  Lidocaine 2 % GEL Apply 1 application topically 3 (three) times daily as needed. 01/04/17   Hali Marry, MD  metoprolol succinate (TOPROL-XL) 100 MG 24 hr tablet TAKE 1 TABLET (100 MG TOTAL) BY MOUTH DAILY. TAKE WITH OR IMMEDIATELY FOLLOWING A MEAL. 05/07/18   Breeback, Jade L, PA-C  pentosan polysulfate (ELMIRON) 100 MG capsule Take 1 capsule (100 mg total) by mouth 3 (three) times daily as needed. 10/10/16   Hali Marry, MD  pregabalin (LYRICA) 150 MG capsule Take 1 capsule (150 mg total) by mouth 2 (two) times daily. 04/30/18   Breeback, Royetta Car, PA-C  promethazine (PHENERGAN) 25 MG tablet TAKE 1 TABLET (25 MG TOTAL) BY MOUTH EVERY 8 (EIGHT) HOURS AS NEEDED FOR NAUSEA OR VOMITING. 08/16/18   Breeback, Jade L, PA-C  rizatriptan (MAXALT) 10 MG tablet Take 1 tablet (10 mg total) by mouth as needed for migraine. May repeat in 2 hours if needed 04/30/18   Breeback, Jade L, PA-C  valACYclovir (VALTREX) 1000 MG tablet Take 1 tablet (1,000 mg total) by mouth daily. 10/02/17   Breeback, Jade L, PA-C  zolpidem (AMBIEN) 10 MG tablet Take 1 tablet (10 mg total) by mouth at bedtime. Please refill early due to patient going on vacation. 04/30/18   Donella Stade, PA-C    Family History Family History  Problem Relation Age of Onset  . Heart attack Father   . ADD / ADHD Brother   . Depression Brother   . Depression Cousin     Social History Social History   Tobacco Use  . Smoking status: Current Some Day Smoker    Packs/day: 0.50    Years: 18.00    Pack years: 9.00    Types: Cigarettes  . Smokeless tobacco: Never Used  Substance Use Topics  . Alcohol use: No    Alcohol/week: 0.0 standard drinks  . Drug use: No     Allergies   Gadolinium derivatives; Prednisone; Amitriptyline; Eggs or egg-derived products; Hydrocodone-acetaminophen; Lunesta [eszopiclone]; and Tramadol   Review of Systems Review of Systems  + sore throat, resolved + cough, improved No pleuritic pain No wheezing + nasal congestion + post-nasal drainage No sinus pain/pressure No itchy/red eyes No earache No hemoptysis No SOB + fever, + chills, resolved + nausea + vomiting, resolved No abdominal pain + diarrhea No urinary symptoms No skin rash + fatigue + myalgias, resolved + headache Used OTC meds without relief   Physical Exam Triage Vital Signs ED Triage Vitals  Enc Vitals Group     BP      Pulse      Resp      Temp      Temp src      SpO2      Weight      Height      Head Circumference      Peak Flow      Pain Score      Pain Loc      Pain Edu?      Excl. in [place]?    No data found.  Updated Vital Signs BP 112/67 (BP Location: Right Arm)   Pulse 65   Temp 97.7 F (36.5 C) (Tympanic)   Resp 20   LMP 05/10/2015   SpO2 100%   Visual Acuity Right Eye Distance:   Left Eye Distance:   Bilateral Distance:  Right Eye Near:   Left Eye Near:    Bilateral Near:     Physical Exam Nursing notes and Vital Signs reviewed. Appearance:  Patient appears stated age, and in no acute distress Eyes:  Pupils are equal, round, and reactive to light and accomodation.  Extraocular movement is intact.  Conjunctivae are not inflamed  Ears:  Canals normal.  Tympanic membranes normal.  Nose:  Mildly congested turbinates.  No sinus tenderness.   Pharynx:  Normal; moist mucous membranes  Neck:  Supple.  Enlarged posterior/lateral nodes are palpated bilaterally, tender to palpation on the left.   Lungs:  Clear to auscultation.  Breath sounds are equal.  Moving air well. Heart:  Regular rate and rhythm without murmurs, rubs, or gallops.  Abdomen:  Nontender without masses or hepatosplenomegaly.  Bowel sounds are present.  No CVA or flank tenderness.  Extremities:  No edema.  Skin:  No rash present.    UC Treatments / Results  Labs (all labs ordered are listed, but only abnormal results are displayed) Labs  Reviewed - No data to display  EKG None  Radiology No results found.  Procedures Procedures (including critical care time)  Medications Ordered in UC Medications - No data to display  Initial Impression / Assessment and Plan / UC Course  I have reviewed the triage vital signs and the nursing notes.  Pertinent labs & imaging results that were available during my care of the patient were reviewed by me and considered in my medical decision making (see chart for details).    Suspect viral URI with GI symptoms, improving.  Treat symptomatically for now. Followup with Family Doctor if not improved in one week.   Final Clinical Impressions(s) / UC Diagnoses   Final diagnoses:  Viral URI with cough  Nausea vomiting and diarrhea     Discharge Instructions     May continue Phenergan as needed for nausea. Begin clear liquids (Pedialyte while having diarrhea) until improved, then advance to a [employer] (Bananas, Rice, Applesauce, Toast).  Then gradually resume a regular diet when tolerated.  Avoid milk products until well.  To decrease diarrhea, mix one teaspoon Citrucel (methylcellulose) in 2 oz water and drink one to three times daily.  Do not drink extra fluids with this dose and do not drink fluids for one hour afterwards.  When stools become more formed, may take Imodium (loperamide) once or twice daily to decrease stool frequency.  If symptoms become significantly worse during the night or over the weekend, proceed to the local emergency room.  Take plain guaifenesin (1200mg  extended release tabs such as Mucinex) twice daily, with plenty of water, for cough and congestion.  Get adequate rest.   May use Afrin nasal spray (or generic oxymetazoline) each morning for about 5 days and then discontinue.  Also recommend using saline nasal spray several times daily and saline nasal irrigation (AYR is a common brand).   Try warm salt water gargles for sore throat.  Stop all antihistamines  for now, and other non-prescription cough/cold preparations. Continue inhalers as prescribed.     ED Prescriptions    None         Kandra Nicolas, MD 08/24/18 1521

## 2018-09-14 ENCOUNTER — Telehealth: Payer: Self-pay | Admitting: Neurology

## 2018-09-14 NOTE — Telephone Encounter (Signed)
Patient left vm stating she dropped two Ambien in the sink and is out. She can not refill until Sunday. She is asking [place] to override this so she can fill RX early. She gets medication at [facility] on [address].   Also been having more issues with anxiety and wanted to make a visit to discuss. Will schedule when I call patient back.   Please advise.

## 2018-09-17 NOTE — Telephone Encounter (Signed)
Left message on machine for patient apologizing that we were out of the office Friday afternoon, but encouraged her to call the office back to schedule a follow up appt.

## 2018-09-17 NOTE — Telephone Encounter (Signed)
Just saw msg Monday morning. Sorry about not being able to approve early refill. She does need her 6 month appt.

## 2018-09-18 ENCOUNTER — Encounter: Payer: Self-pay | Admitting: Physician Assistant

## 2018-09-18 ENCOUNTER — Ambulatory Visit (INDEPENDENT_AMBULATORY_CARE_PROVIDER_SITE_OTHER): Payer: Medicare Other | Admitting: Physician Assistant

## 2018-09-18 VITALS — Temp 97.3°F | Ht 63.0 in | Wt 133.0 lb

## 2018-09-18 DIAGNOSIS — F430 Acute stress reaction: Secondary | ICD-10-CM

## 2018-09-18 DIAGNOSIS — F5101 Primary insomnia: Secondary | ICD-10-CM | POA: Diagnosis not present

## 2018-09-18 DIAGNOSIS — F339 Major depressive disorder, recurrent, unspecified: Secondary | ICD-10-CM | POA: Diagnosis not present

## 2018-09-18 DIAGNOSIS — F411 Generalized anxiety disorder: Secondary | ICD-10-CM | POA: Diagnosis not present

## 2018-09-18 MED ORDER — CLONAZEPAM 0.5 MG PO TABS: ORAL_TABLET | ORAL | 1 refills | Status: DC

## 2018-09-18 MED ORDER — ZOLPIDEM TARTRATE 10 MG PO TABS: 10.0000 mg | ORAL_TABLET | Freq: Every day | ORAL | 5 refills | Status: DC
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
## 2018-09-18 NOTE — Progress Notes (Deleted)
Patient having a lot of issues with anxiety. PHQ9-GAD7 completed.

## 2018-09-26 ENCOUNTER — Other Ambulatory Visit: Payer: Self-pay | Admitting: Physician Assistant

## 2018-09-26 DIAGNOSIS — T887XXA Unspecified adverse effect of drug or medicament, initial encounter: Secondary | ICD-10-CM

## 2018-09-27 NOTE — Telephone Encounter (Signed)
Just sent on 08/16/2018 for #30 with one refill.  Please advise on refill.

## 2018-10-09 ENCOUNTER — Ambulatory Visit (INDEPENDENT_AMBULATORY_CARE_PROVIDER_SITE_OTHER): Payer: Medicaid Other | Admitting: Psychology

## 2018-10-09 ENCOUNTER — Other Ambulatory Visit: Payer: Self-pay | Admitting: Physician Assistant

## 2018-10-09 DIAGNOSIS — F411 Generalized anxiety disorder: Secondary | ICD-10-CM | POA: Diagnosis not present

## 2018-10-09 DIAGNOSIS — G43009 Migraine without aura, not intractable, without status migrainosus: Secondary | ICD-10-CM

## 2018-10-09 DIAGNOSIS — I10 Essential (primary) hypertension: Secondary | ICD-10-CM

## 2018-10-10 DIAGNOSIS — M5412 Radiculopathy, cervical region: Secondary | ICD-10-CM | POA: Diagnosis not present

## 2018-10-12 DIAGNOSIS — Z79899 Other long term (current) drug therapy: Secondary | ICD-10-CM | POA: Diagnosis not present

## 2018-10-12 DIAGNOSIS — S9032XA Contusion of left foot, initial encounter: Secondary | ICD-10-CM | POA: Diagnosis not present

## 2018-10-12 DIAGNOSIS — Z91041 Radiographic dye allergy status: Secondary | ICD-10-CM | POA: Diagnosis not present

## 2018-10-12 DIAGNOSIS — G8911 Acute pain due to trauma: Secondary | ICD-10-CM | POA: Diagnosis not present

## 2018-10-12 DIAGNOSIS — Z888 Allergy status to other drugs, medicaments and biological substances status: Secondary | ICD-10-CM | POA: Diagnosis not present

## 2018-10-12 DIAGNOSIS — Z91012 Allergy to eggs: Secondary | ICD-10-CM | POA: Diagnosis not present

## 2018-10-12 DIAGNOSIS — Z043 Encounter for examination and observation following other accident: Secondary | ICD-10-CM | POA: Diagnosis not present

## 2018-10-12 DIAGNOSIS — F17210 Nicotine dependence, cigarettes, uncomplicated: Secondary | ICD-10-CM | POA: Diagnosis not present

## 2018-10-12 DIAGNOSIS — Z7951 Long term (current) use of inhaled steroids: Secondary | ICD-10-CM | POA: Diagnosis not present

## 2018-10-15 ENCOUNTER — Ambulatory Visit (INDEPENDENT_AMBULATORY_CARE_PROVIDER_SITE_OTHER): Payer: Medicare Other | Admitting: Sports Medicine

## 2018-10-15 ENCOUNTER — Other Ambulatory Visit: Payer: Self-pay

## 2018-10-15 ENCOUNTER — Ambulatory Visit (INDEPENDENT_AMBULATORY_CARE_PROVIDER_SITE_OTHER): Payer: Medicare Other

## 2018-10-15 ENCOUNTER — Encounter: Payer: Self-pay | Admitting: Sports Medicine

## 2018-10-15 ENCOUNTER — Other Ambulatory Visit: Payer: Self-pay | Admitting: Physician Assistant

## 2018-10-15 DIAGNOSIS — G894 Chronic pain syndrome: Secondary | ICD-10-CM

## 2018-10-15 DIAGNOSIS — S9032XA Contusion of left foot, initial encounter: Secondary | ICD-10-CM

## 2018-10-15 DIAGNOSIS — M79672 Pain in left foot: Secondary | ICD-10-CM | POA: Diagnosis not present

## 2018-10-15 DIAGNOSIS — M5010 Cervical disc disorder with radiculopathy, unspecified cervical region: Secondary | ICD-10-CM

## 2018-10-15 DIAGNOSIS — A6000 Herpesviral infection of urogenital system, unspecified: Secondary | ICD-10-CM

## 2018-10-15 DIAGNOSIS — Z8619 Personal history of other infectious and parasitic diseases: Secondary | ICD-10-CM

## 2018-10-15 DIAGNOSIS — S99922A Unspecified injury of left foot, initial encounter: Secondary | ICD-10-CM | POA: Diagnosis not present

## 2018-10-15 DIAGNOSIS — M7918 Myalgia, other site: Secondary | ICD-10-CM

## 2018-10-15 DIAGNOSIS — T887XXA Unspecified adverse effect of drug or medicament, initial encounter: Secondary | ICD-10-CM

## 2018-10-15 MED ORDER — HYDROCODONE-ACETAMINOPHEN 10-325 MG PO TABS: 1.0000 | ORAL_TABLET | Freq: Four times a day (QID) | ORAL | 0 refills | Status: DC | PRN

## 2018-10-15 NOTE — Progress Notes (Signed)
Subjective:    CC: Crushing injury of left foot  HPI: This is a pleasant 43 year old female, recently she was doing some yard work, a tree limb fell on her foot and shin.  She was seen in the emergency department, x-rays were performed that were negative, she had severe pain, swelling, bruising over the shin and the dorsum of her foot.  She thinks that something might be broken and would like this further evaluated by me.  Pain is localized without radiation.  I reviewed the past medical history, family history, social history, surgical history, and allergies today and no changes were needed.  Please see the problem list section below in epic for further details.  Past Medical History: Past Medical History:  Diagnosis Date  . Anxiety   . Asthma   . Complication of anesthesia   . Dysrhythmia    tachycardia  . Elevated heart rate and blood pressure    takes toprol for increased heart rate  . Fibromyalgia   . Headache(784.0)   . Hypertension    no longer takes meds for HTN  . IBS (irritable bowel syndrome)    questionable, per pt; saw Dr. Collene Mares  . IC (interstitial cystitis)   . Insomnia   . PONV (postoperative nausea and vomiting)    Past Surgical History: Past Surgical History:  Procedure Laterality Date  . CARPAL TUNNEL RELEASE Left 06/05/2014   Procedure: LEFT CARPAL TUNNEL RELEASE;  Surgeon: Daryll Brod, MD;  Location: [place];  Service: Orthopedics;  Laterality: Left;  . CARPAL TUNNEL RELEASE Right 08/25/2014   Procedure: RIGHT CARPAL TUNNEL RELEASE;  Surgeon: Daryll Brod, MD;  Location: [place];  Service: Orthopedics;  Laterality: Right;  . CARPAL TUNNEL RELEASE Left 07/14/2015   Procedure: LEFT CARPAL TUNNEL RELEASE;  Surgeon: Daryll Brod, MD;  Location: [place];  Service: Orthopedics;  Laterality: Left;  . CERVICAL SPINE SURGERY  8/15  . CESAREAN SECTION    . DILATION AND EVACUATION N/A 05/07/2013   Procedure: DILATATION AND  EVACUATION with tissue sent for chromosome analysis;  Surgeon: Lovenia Kim, MD;  Location: [facility];  Service: Gynecology;  Laterality: N/A;  . EPIDURAL STEROID INJECTION  03/20/15  . OVARIAN CYST REMOVAL    . PELVIC LAPAROSCOPY  2003   endometriosis  . POSTERIOR CERVICAL FUSION/FORAMINOTOMY N/A 10/28/2014   Procedure: Cervical Four-Cervical Seven Posterior cervical fusion with lateral mass fixation;  Surgeon: Eustace Moore, MD;  Location: [facility];  Service: Neurosurgery;  Laterality: N/A;  posterior  . ROBOTIC ASSISTED LAPAROSCOPIC LYSIS OF ADHESION N/A 07/18/2014   Procedure: ROBOTIC ASSISTED LAPAROSCOPIC  EXCISION POSTERIOR  UTERINE WALL MASS; EXCISION RIGHT MESSALEINGEAL MASS, EXCISION AND ABLATION CULDASAC ENDOMETRIOSIS;  Surgeon: Brien Few, MD;  Location: [facility];  Service: Gynecology;  Laterality: N/A;  . ROBOTIC ASSISTED TOTAL HYSTERECTOMY WITH SALPINGECTOMY Bilateral 05/11/2015   Procedure: ROBOTIC ASSISTED TOTAL HYSTERECTOMY WITH BILATERAL SALPINGECTOMY;  Surgeon: Brien Few, MD;  Location: [facility];  Service: Gynecology;  Laterality: Bilateral;   Social History: Social History   Socioeconomic History  . Marital status: Single    Spouse name: Not on file  . Number of children: 2  . Years of education: Not on file  . Highest education level: Not on file  Occupational History  . Occupation: CMA    Employer: [employer]    Comment: [place]  . Financial resource strain: Not on file  . Food insecurity    Worry:  Not on file    Inability: Not on file  . Transportation needs    Medical: Not on file    Non-medical: Not on file  Tobacco Use  . Smoking status: Current Some Day Smoker    Packs/day: 0.50    Years: 18.00    Pack years: 9.00    Types: Cigarettes  . Smokeless tobacco: Never Used  Substance and Sexual Activity  . Alcohol use: No    Alcohol/week: 0.0 standard drinks  . Drug use: No  . Sexual activity: Yes    Birth  control/protection: Surgical  Lifestyle  . Physical activity    Days per week: Not on file    Minutes per session: Not on file  . Stress: Not on file  Relationships  . Social Herbalist on phone: Not on file    Gets together: Not on file    Attends religious service: Not on file    Active member of club or organization: Not on file    Attends meetings of clubs or organizations: Not on file    Relationship status: Not on file  Other Topics Concern  . Not on file  Social History Narrative   Going through separation.  Children Sage and Luisa Hart   Family History: Family History  Problem Relation Age of Onset  . Heart attack Father   . ADD / ADHD Brother   . Depression Brother   . Depression Cousin    Allergies: Allergies  Allergen Reactions  . Gadolinium Derivatives Hives, Itching and Other (See Comments)    After MRI with Multihance finished, patient had redness and itching on chest, tightness in throat.  Patient went to ED to be monitored (04/23/14).  . Prednisone Other (See Comments)    Pain all over, "it triggers my myofascial pain syndrome"   . Amitriptyline Other (See Comments)    Gained weight and does not want to be on.   . Eggs Or Egg-Derived Products Rash  . Hydrocodone-Acetaminophen Itching    Tolerates with benadryl (no reaction to oxycodone)  . Lunesta [Eszopiclone] Other (See Comments)    Metallic taste in mouth  . Tramadol Itching   Medications: See med rec.  Review of Systems: No fevers, chills, night sweats, weight loss, chest pain, or shortness of breath.   Objective:    General: Well Developed, well nourished, and in no acute distress.  Neuro: Alert and oriented x3, extra-ocular muscles intact, sensation grossly intact.  HEENT: Normocephalic, atraumatic, pupils equal round reactive to light, neck supple, no masses, no lymphadenopathy, thyroid nonpalpable.  Skin: Warm and dry, no rashes. Cardiac: Regular rate and rhythm, no murmurs rubs or  gallops, no lower extremity edema.  Respiratory: Clear to auscultation bilaterally. Not using accessory muscles, speaking in full sentences. Left foot: Swollen, bruised, abrasions on the shin and dorsal midfoot, exquisitely tender to palpation over the abrasions and over the second through fourth metatarsal shaft.  Neurovascularly intact distally.  X-rays personally reviewed, no fracture seen.  Impression and Recommendations:    Traumatic ecchymosis of left foot Repeat x-rays. Strapped with compressive dressing. Hydrocodone for pain, she tolerates this with Benadryl. Cam boot. Return to see me in 2 weeks.   ___________________________________________ Gwen Her. Dianah Field, M.D., ABFM., CAQSM. Primary Care and Sports Medicine [facility]  Adjunct Professor of [employer] [facility] [employer]

## 2018-10-15 NOTE — Assessment & Plan Note (Signed)
Repeat x-rays. Strapped with compressive dressing. Hydrocodone for pain, she tolerates this with Benadryl. Cam boot. Return to see me in 2 weeks.

## 2018-10-16 NOTE — Telephone Encounter (Signed)
Please advise on refill.

## 2018-10-25 ENCOUNTER — Ambulatory Visit: Payer: Medicare Other | Admitting: Psychology

## 2018-10-29 ENCOUNTER — Ambulatory Visit (INDEPENDENT_AMBULATORY_CARE_PROVIDER_SITE_OTHER): Payer: Medicare Other | Admitting: Sports Medicine

## 2018-10-29 DIAGNOSIS — S9032XD Contusion of left foot, subsequent encounter: Secondary | ICD-10-CM

## 2018-10-29 DIAGNOSIS — M65312 Trigger thumb, left thumb: Secondary | ICD-10-CM | POA: Diagnosis not present

## 2018-10-29 NOTE — Progress Notes (Signed)
Subjective:    CC: Follow-up  HPI: Foot injury: Continues to improve with the cam boot.  Finger pain: Left trigger thumb, we last injected this 2 full years ago, now having a recurrence of pain on the volar left thumb, mild triggering, persistent, localized without radiation.  I reviewed the past medical history, family history, social history, surgical history, and allergies today and no changes were needed.  Please see the problem list section below in epic for further details.  Past Medical History: Past Medical History:  Diagnosis Date  . Anxiety   . Asthma   . Complication of anesthesia   . Dysrhythmia    tachycardia  . Elevated heart rate and blood pressure    takes toprol for increased heart rate  . Fibromyalgia   . Headache(784.0)   . Hypertension    no longer takes meds for HTN  . IBS (irritable bowel syndrome)    questionable, per pt; saw Dr. Collene Mares  . IC (interstitial cystitis)   . Insomnia   . PONV (postoperative nausea and vomiting)    Past Surgical History: Past Surgical History:  Procedure Laterality Date  . CARPAL TUNNEL RELEASE Left 06/05/2014   Procedure: LEFT CARPAL TUNNEL RELEASE;  Surgeon: Daryll Brod, MD;  Location: [place];  Service: Orthopedics;  Laterality: Left;  . CARPAL TUNNEL RELEASE Right 08/25/2014   Procedure: RIGHT CARPAL TUNNEL RELEASE;  Surgeon: Daryll Brod, MD;  Location: [place];  Service: Orthopedics;  Laterality: Right;  . CARPAL TUNNEL RELEASE Left 07/14/2015   Procedure: LEFT CARPAL TUNNEL RELEASE;  Surgeon: Daryll Brod, MD;  Location: [place];  Service: Orthopedics;  Laterality: Left;  . CERVICAL SPINE SURGERY  8/15  . CESAREAN SECTION    . DILATION AND EVACUATION N/A 05/07/2013   Procedure: DILATATION AND EVACUATION with tissue sent for chromosome analysis;  Surgeon: Lovenia Kim, MD;  Location: [facility];  Service: Gynecology;  Laterality: N/A;  . EPIDURAL STEROID INJECTION  03/20/15   . OVARIAN CYST REMOVAL    . PELVIC LAPAROSCOPY  2003   endometriosis  . POSTERIOR CERVICAL FUSION/FORAMINOTOMY N/A 10/28/2014   Procedure: Cervical Four-Cervical Seven Posterior cervical fusion with lateral mass fixation;  Surgeon: Eustace Moore, MD;  Location: [facility];  Service: Neurosurgery;  Laterality: N/A;  posterior  . ROBOTIC ASSISTED LAPAROSCOPIC LYSIS OF ADHESION N/A 07/18/2014   Procedure: ROBOTIC ASSISTED LAPAROSCOPIC  EXCISION POSTERIOR  UTERINE WALL MASS; EXCISION RIGHT MESSALEINGEAL MASS, EXCISION AND ABLATION CULDASAC ENDOMETRIOSIS;  Surgeon: Brien Few, MD;  Location: [facility];  Service: Gynecology;  Laterality: N/A;  . ROBOTIC ASSISTED TOTAL HYSTERECTOMY WITH SALPINGECTOMY Bilateral 05/11/2015   Procedure: ROBOTIC ASSISTED TOTAL HYSTERECTOMY WITH BILATERAL SALPINGECTOMY;  Surgeon: Brien Few, MD;  Location: [facility];  Service: Gynecology;  Laterality: Bilateral;   Social History: Social History   Socioeconomic History  . Marital status: Single    Spouse name: Not on file  . Number of children: 2  . Years of education: Not on file  . Highest education level: Not on file  Occupational History  . Occupation: CMA    Employer: [employer]    Comment: [place]  . Financial resource strain: Not on file  . Food insecurity    Worry: Not on file    Inability: Not on file  . Transportation needs    Medical: Not on file    Non-medical: Not on file  Tobacco Use  . Smoking status: Current Some  Day Smoker    Packs/day: 0.50    Years: 18.00    Pack years: 9.00    Types: Cigarettes  . Smokeless tobacco: Never Used  Substance and Sexual Activity  . Alcohol use: No    Alcohol/week: 0.0 standard drinks  . Drug use: No  . Sexual activity: Yes    Birth control/protection: Surgical  Lifestyle  . Physical activity    Days per week: Not on file    Minutes per session: Not on file  . Stress: Not on file  Relationships  . Social Product manager on phone: Not on file    Gets together: Not on file    Attends religious service: Not on file    Active member of club or organization: Not on file    Attends meetings of clubs or organizations: Not on file    Relationship status: Not on file  Other Topics Concern  . Not on file  Social History Narrative   Going through separation.  Children Sage and Luisa Hart   Family History: Family History  Problem Relation Age of Onset  . Heart attack Father   . ADD / ADHD Brother   . Depression Brother   . Depression Cousin    Allergies: Allergies  Allergen Reactions  . Gadolinium Derivatives Hives, Itching and Other (See Comments)    After MRI with Multihance finished, patient had redness and itching on chest, tightness in throat.  Patient went to ED to be monitored (04/23/14).  . Prednisone Other (See Comments)    Pain all over, "it triggers my myofascial pain syndrome"   . Amitriptyline Other (See Comments)    Gained weight and does not want to be on.   . Eggs Or Egg-Derived Products Rash  . Hydrocodone-Acetaminophen Itching    Tolerates with benadryl (no reaction to oxycodone)  . Lunesta [Eszopiclone] Other (See Comments)    Metallic taste in mouth  . Tramadol Itching   Medications: See med rec.  Review of Systems: No fevers, chills, night sweats, weight loss, chest pain, or shortness of breath.   Objective:    General: Well Developed, well nourished, and in no acute distress.  Neuro: Alert and oriented x3, extra-ocular muscles intact, sensation grossly intact.  HEENT: Normocephalic, atraumatic, pupils equal round reactive to light, neck supple, no masses, no lymphadenopathy, thyroid nonpalpable.  Skin: Warm and dry, no rashes. Cardiac: Regular rate and rhythm, no murmurs rubs or gallops, no lower extremity edema.  Respiratory: Clear to auscultation bilaterally. Not using accessory muscles, speaking in full sentences. Left hand: Tender over the flexor pollicis longus tendon  sheath.  Procedure: Real-time Ultrasound Guided injection of the left flexor pollicis longus tendon sheath Device: GE Logiq E  Verbal informed consent obtained.  Time-out conducted.  Noted no overlying erythema, induration, or other signs of local infection.  Skin prepped in a sterile fashion.  Local anesthesia: Topical Ethyl chloride.  With sterile technique and under real time ultrasound guidance:  1/2 cc Kenalog 40, 1/2 cc lidocaine injected easily Completed without difficulty  Pain immediately resolved suggesting accurate placement of the medication.  Advised to call if fevers/chills, erythema, induration, drainage, or persistent bleeding.  Images permanently stored and available for review in the ultrasound unit.  Impression: Technically successful ultrasound guided injection.  Impression and Recommendations:    Trigger thumb, left thumb Repeat flexor pollicis longus tendon sheath injection, previous injection was exactly 2 years ago.   Traumatic ecchymosis of left foot No fractures,  almost completely better with the boot. We can simply watch this for now. She simply had soft tissue contusion.   ___________________________________________ Gwen Her. Dianah Field, M.D., ABFM., CAQSM. Primary Care and Sports Medicine [facility]  Adjunct Professor of [employer] [facility] [employer]

## 2018-10-29 NOTE — Assessment & Plan Note (Addendum)
No fractures, almost completely better with the boot. We can simply watch this for now. She simply had soft tissue contusion.

## 2018-10-29 NOTE — Assessment & Plan Note (Signed)
Repeat flexor pollicis longus tendon sheath injection, previous injection was exactly 2 years ago.

## 2018-10-30 ENCOUNTER — Encounter: Payer: Self-pay | Admitting: Physician Assistant

## 2018-10-30 ENCOUNTER — Ambulatory Visit (INDEPENDENT_AMBULATORY_CARE_PROVIDER_SITE_OTHER): Payer: Medicare Other | Admitting: Physician Assistant

## 2018-10-30 VITALS — Ht 63.0 in | Wt 133.0 lb

## 2018-10-30 DIAGNOSIS — F330 Major depressive disorder, recurrent, mild: Secondary | ICD-10-CM | POA: Diagnosis not present

## 2018-10-30 DIAGNOSIS — M5412 Radiculopathy, cervical region: Secondary | ICD-10-CM | POA: Diagnosis not present

## 2018-10-30 DIAGNOSIS — F411 Generalized anxiety disorder: Secondary | ICD-10-CM | POA: Diagnosis not present

## 2018-10-30 DIAGNOSIS — M5010 Cervical disc disorder with radiculopathy, unspecified cervical region: Secondary | ICD-10-CM | POA: Diagnosis not present

## 2018-10-30 DIAGNOSIS — G629 Polyneuropathy, unspecified: Secondary | ICD-10-CM | POA: Diagnosis not present

## 2018-10-30 DIAGNOSIS — M961 Postlaminectomy syndrome, not elsewhere classified: Secondary | ICD-10-CM | POA: Diagnosis not present

## 2018-10-30 MED ORDER — FOLTANX 3-35-2 MG PO TABS: 1.0000 | ORAL_TABLET | Freq: Two times a day (BID) | ORAL | 11 refills | Status: DC

## 2018-10-30 MED ORDER — VORTIOXETINE HBR 5 MG PO TABS: 5.0000 mg | ORAL_TABLET | Freq: Every day | ORAL | 2 refills | Status: DC
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
## 2018-10-30 NOTE — Progress Notes (Signed)
Patient having increased irritability due to pain not being controlled. She is having issues with her pain management provider. She is taking Clonazepam very rarely but it helps with sleep. PHQ9-GAD7 completed.

## 2018-10-31 ENCOUNTER — Encounter: Payer: Self-pay | Admitting: Physician Assistant

## 2018-11-09 ENCOUNTER — Ambulatory Visit: Payer: Medicare Other | Admitting: Psychology

## 2018-11-15 ENCOUNTER — Telehealth: Payer: Self-pay | Admitting: Physician Assistant

## 2018-11-15 NOTE — Telephone Encounter (Signed)
Received a fax from [employer] that Trintellix has been approved through 05/02/2019. Pharmacy aware and form sent to scan.

## 2018-11-15 NOTE — Telephone Encounter (Signed)
Received fax from [employer] that Trintellix requires a PA. Information has been sent to the insurance company. Awaiting determination.

## 2018-11-16 DIAGNOSIS — M4802 Spinal stenosis, cervical region: Secondary | ICD-10-CM | POA: Diagnosis not present

## 2018-11-19 ENCOUNTER — Ambulatory Visit (INDEPENDENT_AMBULATORY_CARE_PROVIDER_SITE_OTHER): Payer: Medicare Other | Admitting: Physician Assistant

## 2018-11-19 ENCOUNTER — Ambulatory Visit (INDEPENDENT_AMBULATORY_CARE_PROVIDER_SITE_OTHER): Payer: Medicaid Other | Admitting: Psychology

## 2018-11-19 DIAGNOSIS — F411 Generalized anxiety disorder: Secondary | ICD-10-CM

## 2018-11-19 NOTE — Progress Notes (Signed)
Called patient for prescreening and she states wants to cancel appt. Feeling better.

## 2018-11-23 ENCOUNTER — Ambulatory Visit: Payer: Medicare Other | Admitting: Sports Medicine

## 2018-11-27 ENCOUNTER — Encounter: Payer: Self-pay | Admitting: Osteopathic Medicine

## 2018-11-27 ENCOUNTER — Ambulatory Visit (INDEPENDENT_AMBULATORY_CARE_PROVIDER_SITE_OTHER): Payer: Medicare Other | Admitting: Osteopathic Medicine

## 2018-11-27 DIAGNOSIS — R6889 Other general symptoms and signs: Secondary | ICD-10-CM | POA: Diagnosis not present

## 2018-11-27 DIAGNOSIS — J019 Acute sinusitis, unspecified: Secondary | ICD-10-CM

## 2018-11-27 DIAGNOSIS — Z20822 Contact with and (suspected) exposure to covid-19: Secondary | ICD-10-CM

## 2018-11-27 MED ORDER — ONDANSETRON 8 MG PO TBDP: 8.0000 mg | ORAL_TABLET | Freq: Three times a day (TID) | ORAL | 0 refills | Status: DC | PRN

## 2018-11-27 MED ORDER — GUAIFENESIN-CODEINE 100-10 MG/5ML PO SYRP: 5.0000 mL | ORAL_SOLUTION | Freq: Three times a day (TID) | ORAL | 0 refills | Status: DC | PRN

## 2018-11-27 MED ORDER — AZITHROMYCIN 250 MG PO TABS: ORAL_TABLET | ORAL | 0 refills | Status: DC

## 2018-11-27 NOTE — Progress Notes (Signed)
Virtual Visit via Video (App used: Doximity) Note  I connected with      [name] on 11/27/18 at 12:00 PM by a telemedicine application and verified that I am speaking with the correct person using two identifiers.   I discussed the limitations of evaluation and management by telemedicine and the availability of in person appointments. The patient expressed understanding and agreed to proceed.  History of Present Illness: [name] is a 43 y.o. female who would like to discuss feeling sick    Nasal congestion, eye puffiness, coughing, sore throat.  Ongoing 4 days at this point Neck and cest hurt from coughing  Nausea and fatigue as well Seems to be getting worse  Unable to measure temperature at home but does report subjective fever/chills.  Has seasonal allergies and this feels different.       Observations/Objective: LMP 05/10/2015  BP Readings from Last 3 Encounters:  10/15/18 (!) 118/45  08/24/18 112/67  07/02/15 138/84   Exam: Normal Speech.  NAD  Lab and Radiology Results No results found for this or any previous visit (from the past 72 hour(s)). No results found.     Assessment and Plan: 43 y.o. female with The primary encounter diagnosis was Suspected Covid-19 Virus Infection. A diagnosis of Acute non-recurrent sinusitis, unspecified location was also pertinent to this visit.  Patient was advised to proceed to [facility] in [place] for testing, or she can go to local [facility] but advised that I will not have any control over these orders and she would need to make sure that she follows up with that facility and that they are sending results to me.   PDMP not reviewed this encounter. Orders Placed This Encounter  Procedures  . Novel Coronavirus, NAA ([facility])    Order Specific Question:   Known Exposure    Answer:   No   Meds ordered this encounter  Medications  . azithromycin (ZITHROMAX) 250 MG tablet    Sig:  2 tabs po x1 on Day 1, then 1 tab po daily on Days 2 - 5    Dispense:  6 tablet    Refill:  0  . guaiFENesin-codeine (ROBITUSSIN AC) 100-10 MG/5ML syrup    Sig: Take 5-10 mLs by mouth 3 (three) times daily as needed for cough.    Dispense:  118 mL    Refill:  0  . ondansetron (ZOFRAN ODT) 8 MG disintegrating tablet    Sig: Take 1 tablet (8 mg total) by mouth every 8 (eight) hours as needed for nausea or vomiting.    Dispense:  20 tablet    Refill:  0   Patient Instructions   Based on Centers for Disease Control and Prevention (CDC) guidelines, he/she is required to stay home and self-monitor until the test results are returned.  1. If results are positive, the patient will need to stay at home and self-isolate until the follow conditions are met: a. at least 10 days since symptoms onset or date of positive test results b. AND 3 days fever free without antipyretics (Tylenol or Ibuprofen) c. AND improvement in respiratory symptoms.  2. If results are negative, and the patient is asymptomatic, they may resume normal activities.  3. If the results of the test are negative and the patient develops symptoms, they should self-isolate at home, self-monitor, and notify their primary care provider. a. They should continue to self-isolate at home and self-monitor until they have met the CDC "  Non-Test Criteria for Ending Self-Isolation" which includes all of the following: i. at least 10 days since symptoms onset or date of positive test results ii. AND 3 days fever free without antipyretics (Tylenol or Ibuprofen) iii. AND improvement in respiratory symptoms.   These measures are being implemented nationally out of an abundance of caution given the expanding outbreak of COVID-19 and are consistent with current CDC recommendations.      HIPAA PROTECTED HEALTH INFORMATION WARNING: Please be aware that email communication can be intercepted in transmission or misdirected. Your use of email to  communicate protected health information to me indicates you acknowledge and accept the possible risks associated with such communication. Please consider communicating very sensitive information by phone to the office, by fax or by mail/postal service.    Instructions sent via MyChart. If MyChart not available, pt was given option for info via personal e-mail w/ no guarantee of protected health info over unsecured e-mail communication, and MyChart sign-up instructions were included.   Follow Up Instructions: Return for RECHECK PENDING RESULTS / IF WORSE OR CHANGE.    I discussed the assessment and treatment plan with the patient. The patient was provided an opportunity to ask questions and all were answered. The patient agreed with the plan and demonstrated an understanding of the instructions.   The patient was advised to call back or seek an in-person evaluation if any new concerns, if symptoms worsen or if the condition fails to improve as anticipated.  25 minutes of non-face-to-face time was provided during this encounter.                      Historical information moved to improve visibility of documentation.  Past Medical History:  Diagnosis Date  . Anxiety   . Asthma   . Complication of anesthesia   . Dysrhythmia    tachycardia  . Elevated heart rate and blood pressure    takes toprol for increased heart rate  . Fibromyalgia   . Headache(784.0)   . Hypertension    no longer takes meds for HTN  . IBS (irritable bowel syndrome)    questionable, per pt; saw Dr. Collene Mares  . IC (interstitial cystitis)   . Insomnia   . PONV (postoperative nausea and vomiting)    Past Surgical History:  Procedure Laterality Date  . CARPAL TUNNEL RELEASE Left 06/05/2014   Procedure: LEFT CARPAL TUNNEL RELEASE;  Surgeon: Daryll Brod, MD;  Location: [facility];  Service: Orthopedics;  Laterality: Left;  . CARPAL TUNNEL RELEASE Right 08/25/2014   Procedure: RIGHT  CARPAL TUNNEL RELEASE;  Surgeon: Daryll Brod, MD;  Location: [facility];  Service: Orthopedics;  Laterality: Right;  . CARPAL TUNNEL RELEASE Left 07/14/2015   Procedure: LEFT CARPAL TUNNEL RELEASE;  Surgeon: Daryll Brod, MD;  Location: [facility];  Service: Orthopedics;  Laterality: Left;  . CERVICAL SPINE SURGERY  8/15  . CESAREAN SECTION    . DILATION AND EVACUATION N/A 05/07/2013   Procedure: DILATATION AND EVACUATION with tissue sent for chromosome analysis;  Surgeon: Lovenia Kim, MD;  Location: [facility];  Service: Gynecology;  Laterality: N/A;  . EPIDURAL STEROID INJECTION  03/20/15  . OVARIAN CYST REMOVAL    . PELVIC LAPAROSCOPY  2003   endometriosis  . POSTERIOR CERVICAL FUSION/FORAMINOTOMY N/A 10/28/2014   Procedure: Cervical Four-Cervical Seven Posterior cervical fusion with lateral mass fixation;  Surgeon: Eustace Moore, MD;  Location: [facility];  Service: Neurosurgery;  Laterality: N/A;  posterior  . ROBOTIC ASSISTED LAPAROSCOPIC LYSIS OF ADHESION N/A 07/18/2014   Procedure: ROBOTIC ASSISTED LAPAROSCOPIC  EXCISION POSTERIOR  UTERINE WALL MASS; EXCISION RIGHT MESSALEINGEAL MASS, EXCISION AND ABLATION CULDASAC ENDOMETRIOSIS;  Surgeon: Brien Few, MD;  Location: [facility];  Service: Gynecology;  Laterality: N/A;  . ROBOTIC ASSISTED TOTAL HYSTERECTOMY WITH SALPINGECTOMY Bilateral 05/11/2015   Procedure: ROBOTIC ASSISTED TOTAL HYSTERECTOMY WITH BILATERAL SALPINGECTOMY;  Surgeon: Brien Few, MD;  Location: [facility];  Service: Gynecology;  Laterality: Bilateral;   Social History   Tobacco Use  . Smoking status: Current Some Day Smoker    Packs/day: 0.50    Years: 18.00    Pack years: 9.00    Types: Cigarettes  . Smokeless tobacco: Never Used  Substance Use Topics  . Alcohol use: No    Alcohol/week: 0.0 standard drinks   family history includes ADD / ADHD in her brother; Depression in her brother and cousin; Heart attack in her  father.  Medications: Current Outpatient Medications  Medication Sig Dispense Refill  . azithromycin (ZITHROMAX) 250 MG tablet 2 tabs po x1 on Day 1, then 1 tab po daily on Days 2 - 5 6 tablet 0  . carisoprodol (SOMA) 350 MG tablet TAKE 1 TABLET (350 MG TOTAL) BY MOUTH 2 (TWO) TIMES DAILY AS NEEDED FOR MUSCLE SPASMS. 60 tablet 2  . clonazePAM (KLONOPIN) 0.5 MG tablet Take one tablet as needed for anxiety no more than once a day. 30 tablet 1  . Diclofenac Sodium 2 % SOLN Place 2 sprays onto the skin 2 (two) times daily. 1 Bottle 11  . fluticasone furoate-vilanterol (BREO ELLIPTA) 100-25 MCG/INH AEPB Inhale 1 puff into the lungs daily. 1 each 11  . guaiFENesin-codeine (ROBITUSSIN AC) 100-10 MG/5ML syrup Take 5-10 mLs by mouth 3 (three) times daily as needed for cough. 118 mL 0  . hydrochlorothiazide (HYDRODIURIL) 12.5 MG tablet Take 1 tablet (12.5 mg total) by mouth daily as needed. APPT FOR FURTHER REFILLS 90 tablet 0  . hydrOXYzine (ATARAX/VISTARIL) 50 MG tablet Take 1 tablet (50 mg total) by mouth 3 (three) times daily as needed. 270 tablet 1  . L-Methylfolate-B6-B12 (FOLTANX) 3-35-2 MG TABS Take 1 tablet by mouth 2 (two) times a day. 60 tablet 11  . metoprolol succinate (TOPROL-XL) 100 MG 24 hr tablet TAKE 1 TABLET (100 MG TOTAL) BY MOUTH DAILY. TAKE WITH OR IMMEDIATELY FOLLOWING A MEAL. 90 tablet 3  . ondansetron (ZOFRAN ODT) 8 MG disintegrating tablet Take 1 tablet (8 mg total) by mouth every 8 (eight) hours as needed for nausea or vomiting. 20 tablet 0  . pentosan polysulfate (ELMIRON) 100 MG capsule Take 1 capsule (100 mg total) by mouth 3 (three) times daily as needed. 90 capsule 3  . pregabalin (LYRICA) 150 MG capsule Take 1 capsule (150 mg total) by mouth 2 (two) times daily. 180 capsule 1  . promethazine (PHENERGAN) 25 MG tablet TAKE 1 TABLET (25 MG TOTAL) BY MOUTH EVERY 8 (EIGHT) HOURS AS NEEDED FOR NAUSEA OR VOMITING. 30 tablet 1  . rizatriptan (MAXALT) 10 MG tablet Take 1 tablet po  prn for migraine, may repeat 2 hrs prn APPT FOR FURTHER REFILLS 10 tablet 2  . valACYclovir (VALTREX) 1000 MG tablet TAKE 1 TABLET BY MOUTH EVERY DAY 90 tablet 1  . vortioxetine HBr (TRINTELLIX) 5 MG TABS tablet Take 1 tablet (5 mg total) by mouth daily. 30 tablet 2  . zolpidem (AMBIEN) 10 MG tablet Take 1 tablet (10 mg total) by  mouth at bedtime. Please refill early due to patient going on vacation. 30 tablet 5   No current facility-administered medications for this visit.    Allergies  Allergen Reactions  . Gadolinium Derivatives Hives, Itching and Other (See Comments)    After MRI with [facility] finished, patient had redness and itching on chest, tightness in throat.  Patient went to ED to be monitored (04/23/14).  . Prednisone Other (See Comments)    Pain all over, "it triggers my myofascial pain syndrome"   . Amitriptyline Other (See Comments)    Gained weight and does not want to be on.   . Eggs Or Egg-Derived Products Rash  . Hydrocodone-Acetaminophen Itching    Tolerates with benadryl (no reaction to oxycodone)  . Lunesta [Eszopiclone] Other (See Comments)    Metallic taste in mouth  . Tramadol Itching    PDMP not reviewed this encounter. Orders Placed This Encounter  Procedures  . Novel Coronavirus, NAA ([facility])    Order Specific Question:   Known Exposure    Answer:   No   Meds ordered this encounter  Medications  . azithromycin (ZITHROMAX) 250 MG tablet    Sig: 2 tabs po x1 on Day 1, then 1 tab po daily on Days 2 - 5    Dispense:  6 tablet    Refill:  0  . guaiFENesin-codeine (ROBITUSSIN AC) 100-10 MG/5ML syrup    Sig: Take 5-10 mLs by mouth 3 (three) times daily as needed for cough.    Dispense:  118 mL    Refill:  0  . ondansetron (ZOFRAN ODT) 8 MG disintegrating tablet    Sig: Take 1 tablet (8 mg total) by mouth every 8 (eight) hours as needed for nausea or vomiting.    Dispense:  20 tablet    Refill:  0

## 2018-11-27 NOTE — Patient Instructions (Signed)
  Based on Centers for Disease Control and Prevention (CDC) guidelines, he/she is required to stay home and self-monitor until the test results are returned.  1. If results are positive, the patient will need to stay at home and self-isolate until the follow conditions are met: a. at least 10 days since symptoms onset or date of positive test results b. AND 3 days fever free without antipyretics (Tylenol or Ibuprofen) c. AND improvement in respiratory symptoms.  2. If results are negative, and the patient is asymptomatic, they may resume normal activities.  3. If the results of the test are negative and the patient develops symptoms, they should self-isolate at home, self-monitor, and notify their primary care provider. a. They should continue to self-isolate at home and self-monitor until they have met the CDC "Non-Test Criteria for Ending Self-Isolation" which includes all of the following: i. at least 10 days since symptoms onset or date of positive test results ii. AND 3 days fever free without antipyretics (Tylenol or Ibuprofen) iii. AND improvement in respiratory symptoms.   These measures are being implemented nationally out of an abundance of caution given the expanding outbreak of COVID-19 and are consistent with current CDC recommendations.      HIPAA PROTECTED HEALTH INFORMATION WARNING: Please be aware that email communication can be intercepted in transmission or misdirected. Your use of email to communicate protected health information to me indicates you acknowledge and accept the possible risks associated with such communication. Please consider communicating very sensitive information by phone to the office, by fax or by mail/postal service.

## 2018-11-28 ENCOUNTER — Encounter: Payer: Self-pay | Admitting: Osteopathic Medicine

## 2018-11-29 DIAGNOSIS — M961 Postlaminectomy syndrome, not elsewhere classified: Secondary | ICD-10-CM | POA: Diagnosis not present

## 2018-11-29 DIAGNOSIS — M4802 Spinal stenosis, cervical region: Secondary | ICD-10-CM | POA: Diagnosis not present

## 2018-11-29 DIAGNOSIS — G894 Chronic pain syndrome: Secondary | ICD-10-CM | POA: Diagnosis not present

## 2018-11-29 DIAGNOSIS — M5010 Cervical disc disorder with radiculopathy, unspecified cervical region: Secondary | ICD-10-CM | POA: Diagnosis not present

## 2018-12-03 ENCOUNTER — Ambulatory Visit (INDEPENDENT_AMBULATORY_CARE_PROVIDER_SITE_OTHER): Payer: Medicaid Other | Admitting: Psychology

## 2018-12-03 ENCOUNTER — Ambulatory Visit: Payer: Medicare Other | Admitting: Psychology

## 2018-12-03 DIAGNOSIS — F411 Generalized anxiety disorder: Secondary | ICD-10-CM | POA: Diagnosis not present

## 2018-12-06 ENCOUNTER — Other Ambulatory Visit: Payer: Self-pay | Admitting: Physician Assistant

## 2018-12-13 DIAGNOSIS — M542 Cervicalgia: Secondary | ICD-10-CM | POA: Diagnosis not present

## 2018-12-31 ENCOUNTER — Ambulatory Visit: Payer: Medicare Other | Admitting: Psychology

## 2019-01-01 ENCOUNTER — Other Ambulatory Visit: Payer: Self-pay | Admitting: Physician Assistant

## 2019-01-01 DIAGNOSIS — F411 Generalized anxiety disorder: Secondary | ICD-10-CM

## 2019-01-01 DIAGNOSIS — M961 Postlaminectomy syndrome, not elsewhere classified: Secondary | ICD-10-CM | POA: Diagnosis not present

## 2019-01-01 DIAGNOSIS — I10 Essential (primary) hypertension: Secondary | ICD-10-CM

## 2019-01-01 DIAGNOSIS — M4802 Spinal stenosis, cervical region: Secondary | ICD-10-CM | POA: Diagnosis not present

## 2019-01-01 DIAGNOSIS — M5412 Radiculopathy, cervical region: Secondary | ICD-10-CM | POA: Diagnosis not present

## 2019-01-03 ENCOUNTER — Other Ambulatory Visit: Payer: Self-pay | Admitting: Physician Assistant

## 2019-01-03 DIAGNOSIS — G43009 Migraine without aura, not intractable, without status migrainosus: Secondary | ICD-10-CM

## 2019-01-10 ENCOUNTER — Emergency Department (INDEPENDENT_AMBULATORY_CARE_PROVIDER_SITE_OTHER): Payer: Medicare Other

## 2019-01-10 ENCOUNTER — Other Ambulatory Visit: Payer: Self-pay

## 2019-01-10 ENCOUNTER — Encounter: Payer: Self-pay | Admitting: Family Medicine

## 2019-01-10 ENCOUNTER — Emergency Department (INDEPENDENT_AMBULATORY_CARE_PROVIDER_SITE_OTHER)
Admission: EM | Admit: 2019-01-10 | Discharge: 2019-01-10 | Disposition: A | Discharge status: Home / Self Care | Payer: Medicare Other | Source: Home / Self Care

## 2019-01-10 DIAGNOSIS — S9032XA Contusion of left foot, initial encounter: Secondary | ICD-10-CM

## 2019-01-10 DIAGNOSIS — S99922A Unspecified injury of left foot, initial encounter: Secondary | ICD-10-CM | POA: Diagnosis not present

## 2019-01-10 DIAGNOSIS — M79672 Pain in left foot: Secondary | ICD-10-CM

## 2019-01-10 MED ORDER — IBUPROFEN 600 MG PO TABS
600.0000 mg | ORAL_TABLET | Freq: Once | ORAL | Status: AC
  Administered 2019-01-10: 600 mg via ORAL

## 2019-01-10 MED ORDER — DICLOFENAC SODIUM 75 MG PO TBEC: 75.0000 mg | DELAYED_RELEASE_TABLET | Freq: Two times a day (BID) | ORAL | 0 refills | Status: DC

## 2019-01-10 NOTE — ED Triage Notes (Signed)
Pt c/o LT foot pain x 2 days. Says she injured it at work yesterday and then re-injured again today. Pain 10/10. Pain mostly in ball of foot as well as toes.
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
## 2019-01-22 ENCOUNTER — Ambulatory Visit: Payer: Medicare Other | Admitting: Sports Medicine

## 2019-01-23 ENCOUNTER — Ambulatory Visit: Payer: Medicare Other | Admitting: Sports Medicine

## 2019-01-31 ENCOUNTER — Other Ambulatory Visit: Payer: Self-pay

## 2019-01-31 ENCOUNTER — Other Ambulatory Visit: Payer: Self-pay | Admitting: Physician Assistant

## 2019-01-31 ENCOUNTER — Encounter: Payer: Self-pay | Admitting: Emergency Medicine

## 2019-01-31 ENCOUNTER — Emergency Department (INDEPENDENT_AMBULATORY_CARE_PROVIDER_SITE_OTHER)
Admission: EM | Admit: 2019-01-31 | Discharge: 2019-01-31 | Disposition: A | Discharge status: Home / Self Care | Payer: Medicare Other | Source: Home / Self Care | Attending: Family Medicine | Admitting: Family Medicine

## 2019-01-31 DIAGNOSIS — M25561 Pain in right knee: Secondary | ICD-10-CM | POA: Diagnosis not present

## 2019-01-31 DIAGNOSIS — F330 Major depressive disorder, recurrent, mild: Secondary | ICD-10-CM

## 2019-01-31 MED ORDER — OXYCODONE-ACETAMINOPHEN 5-325 MG PO TABS: ORAL_TABLET | ORAL | 0 refills | Status: DC

## 2019-01-31 NOTE — ED Triage Notes (Signed)
PT reports right knee pain that started Friday after she worked in the yard. PT has tried ice, elevation, 800 mg ibuprofen without relief.   No known injury

## 2019-01-31 NOTE — ED Provider Notes (Signed)
[name]    CSN: [id number] Arrival date & time: 01/31/19  [id number]      History   Chief Complaint Chief Complaint  Patient presents with  . Knee Pain    HPI [name] is a 43 y.o. female.   Patient was working in her yard about a week ago, kneeling on the ground pulling weeks for an extended length of time.  She subsequently developed swelling/pain in her right knee.  She denies any other direct trauma to her right knee.  The swelling resolved but she still has pain with walking and flexing/extending her right knee.  The pain awakens her at night.  She has had poor response to ibuprofen. Patient also has a history of chronic neck and back pain.  The history is provided by the patient.  Knee Pain Location:  Knee Time since incident:  1 week Injury: no   Knee location:  R knee Pain details:    Quality:  Aching   Radiates to: right lower anterior leg.   Severity:  Moderate   Onset quality:  Sudden   Duration:  1 week   Timing:  Constant   Progression:  Unchanged Chronicity:  New Prior injury to area:  No Relieved by:  Nothing Worsened by:  Activity, bearing weight, extension and flexion Ineffective treatments:  NSAIDs Associated symptoms: swelling   Associated symptoms: no decreased ROM, no muscle weakness, no numbness and no stiffness     Past Medical History:  Diagnosis Date  . Anxiety   . Asthma   . Complication of anesthesia   . Dysrhythmia    tachycardia  . Elevated heart rate and blood pressure    takes toprol for increased heart rate  . Fibromyalgia   . Headache(784.0)   . Hypertension    no longer takes meds for HTN  . IBS (irritable bowel syndrome)    questionable, per pt; saw Dr. Collene Mares  . IC (interstitial cystitis)   . Insomnia   . PONV (postoperative nausea and vomiting)     Patient Active Problem List   Diagnosis Date Noted  . Mild episode of recurrent major depressive disorder ([place]) 10/30/2018  . Traumatic  ecchymosis of left foot 10/15/2018  . Asthma 01/29/2018  . Chronic neck pain 01/29/2018  . Hypertension 10/06/2017  . Hx of cold sores 10/06/2017  . First degree ankle sprain, left, initial encounter 08/29/2017  . 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  . Chronic pain syndrome 01/08/2017  . Trigger thumb, left thumb 10/06/2016  . Pes anserinus bursitis of right knee 09/05/2016  . Balance problems 07/19/2016  . Dizziness 07/19/2016  . Palpitations 04/14/2016  . Primary osteoarthritis of left first metacarpophalangeal joint 04/04/2016  . Fixed pupils 08/31/2015  . Dysuria 08/31/2015  . Depression 08/31/2015  . Vasomotor flushing 06/15/2015  . GAD (generalized anxiety disorder) 06/15/2015  . Therapeutic opioid induced constipation 12/16/2014  . S/P cervical spinal fusion 10/28/2014  . Tachycardia 08/13/2014  . Endometriosis 07/20/2014  . Paresthesia 06/03/2014  . Myofascial pain syndrome 05/29/2014  . Carpal tunnel syndrome of left wrist 05/05/2014  . Left shoulder pain 04/02/2014  . Constipation due to opioid therapy 01/17/2014  . Weight gain 11/29/2013  . Greater trochanteric bursitis 11/29/2013  . PTSD (post-traumatic stress disorder) 12/14/2012  . Tick-borne disease 08/09/2010  . TROCHANTERIC BURSITIS 06/21/2010  . Smoker 02/05/2010  . INTERSTITIAL CYSTITIS 02/05/2010  . FATIGUE 07/02/2009  . Depressed 06/26/2008  . Anxiety state 07/06/2007  . Insomnia 07/06/2007  . HEADACHE  07/06/2007    Past Surgical History:  Procedure Laterality Date  . CARPAL TUNNEL RELEASE Left 06/05/2014   Procedure: LEFT CARPAL TUNNEL RELEASE;  Surgeon: Daryll Brod, MD;  Location: [facility];  Service: Orthopedics;  Laterality: Left;  . CARPAL TUNNEL RELEASE Right 08/25/2014   Procedure: RIGHT CARPAL TUNNEL RELEASE;  Surgeon: Daryll Brod, MD;  Location: [facility];  Service: Orthopedics;  Laterality: Right;  . CARPAL TUNNEL RELEASE Left 07/14/2015   Procedure: LEFT CARPAL TUNNEL  RELEASE;  Surgeon: Daryll Brod, MD;  Location: [facility];  Service: Orthopedics;  Laterality: Left;  . CERVICAL SPINE SURGERY  8/15  . CESAREAN SECTION    . DILATION AND EVACUATION N/A 05/07/2013   Procedure: DILATATION AND EVACUATION with tissue sent for chromosome analysis;  Surgeon: Lovenia Kim, MD;  Location: [facility];  Service: Gynecology;  Laterality: N/A;  . EPIDURAL STEROID INJECTION  03/20/15  . OVARIAN CYST REMOVAL    . PELVIC LAPAROSCOPY  2003   endometriosis  . POSTERIOR CERVICAL FUSION/FORAMINOTOMY N/A 10/28/2014   Procedure: Cervical Four-Cervical Seven Posterior cervical fusion with lateral mass fixation;  Surgeon: Eustace Moore, MD;  Location: [facility];  Service: Neurosurgery;  Laterality: N/A;  posterior  . ROBOTIC ASSISTED LAPAROSCOPIC LYSIS OF ADHESION N/A 07/18/2014   Procedure: ROBOTIC ASSISTED LAPAROSCOPIC  EXCISION POSTERIOR  UTERINE WALL MASS; EXCISION RIGHT MESSALEINGEAL MASS, EXCISION AND ABLATION CULDASAC ENDOMETRIOSIS;  Surgeon: Brien Few, MD;  Location: [facility];  Service: Gynecology;  Laterality: N/A;  . ROBOTIC ASSISTED TOTAL HYSTERECTOMY WITH SALPINGECTOMY Bilateral 05/11/2015   Procedure: ROBOTIC ASSISTED TOTAL HYSTERECTOMY WITH BILATERAL SALPINGECTOMY;  Surgeon: Brien Few, MD;  Location: [facility];  Service: Gynecology;  Laterality: Bilateral;    OB History   No obstetric history on file.      Home Medications    Prior to Admission medications   Medication Sig Start Date End Date Taking? Authorizing Provider  BREO ELLIPTA 100-25 MCG/INH AEPB TAKE 1 PUFF BY MOUTH EVERY DAY 12/06/18  Yes Breeback, Jade L, PA-C  carisoprodol (SOMA) 350 MG tablet Take 350 mg by mouth 4 (four) times daily as needed for muscle spasms.   Yes [provider]  diclofenac (VOLTAREN) 75 MG EC tablet Take 1 tablet (75 mg total) by mouth 2 (two) times daily. 01/10/19  Yes Robyn Haber, MD  hydrOXYzine (ATARAX/VISTARIL) 50 MG tablet TAKE 1 TABLET (50 MG  TOTAL) BY MOUTH 3 (THREE) TIMES DAILY AS NEEDED. 01/01/19  Yes Breeback, Jade L, PA-C  pregabalin (LYRICA) 150 MG capsule Take 1 capsule (150 mg total) by mouth 2 (two) times daily. 04/30/18  Yes Breeback, Jade L, PA-C  vortioxetine HBr (TRINTELLIX) 5 MG TABS tablet Take 1 tablet (5 mg total) by mouth daily. 10/30/18  Yes Breeback, Jade L, PA-C  zolpidem (AMBIEN) 10 MG tablet Take 1 tablet (10 mg total) by mouth at bedtime. Please refill early due to patient going on vacation. 09/18/18  Yes Breeback, Jade L, PA-C  clonazePAM (KLONOPIN) 0.5 MG tablet Take one tablet as needed for anxiety no more than once a day. 09/18/18   Breeback, Jade L, PA-C  hydrochlorothiazide (HYDRODIURIL) 12.5 MG tablet TAKE 1 TABLET (12.5 MG TOTAL) BY MOUTH DAILY AS NEEDED. APPT FOR FURTHER REFILLS 01/01/19   Breeback, Jade L, PA-C  L-Methylfolate-B6-B12 (FOLTANX) 3-35-2 MG TABS Take 1 tablet by mouth 2 (two) times a day. 10/30/18   Breeback, Jade L, PA-C  ondansetron (ZOFRAN ODT) 8 MG disintegrating tablet Take  1 tablet (8 mg total) by mouth every 8 (eight) hours as needed for nausea or vomiting. 11/27/18   Emeterio Reeve, DO  oxyCODONE-acetaminophen (PERCOCET) 5-325 MG tablet Take one tab PO HS prn knee pain 01/31/19   Kandra Nicolas, MD  pentosan polysulfate (ELMIRON) 100 MG capsule Take 1 capsule (100 mg total) by mouth 3 (three) times daily as needed. 10/10/16   Hali Marry, MD  promethazine (PHENERGAN) 25 MG tablet TAKE 1 TABLET (25 MG TOTAL) BY MOUTH EVERY 8 (EIGHT) HOURS AS NEEDED FOR NAUSEA OR VOMITING. 10/16/18   Breeback, Jade L, PA-C  rizatriptan (MAXALT) 10 MG tablet TAKE 1 TABLET PO PRN FOR MIGRAINE, MAY REPEAT 2 HRS PRN APPT FOR FURTHER REFILLS 01/03/19   Breeback, Jade L, PA-C  valACYclovir (VALTREX) 1000 MG tablet TAKE 1 TABLET BY MOUTH EVERY DAY 10/16/18   Breeback, Jade L, PA-C  metoprolol succinate (TOPROL-XL) 100 MG 24 hr tablet TAKE 1 TABLET (100 MG TOTAL) BY MOUTH DAILY. TAKE WITH OR IMMEDIATELY FOLLOWING  A MEAL. 05/07/18 01/10/19  Donella Stade, PA-C    Family History Family History  Problem Relation Age of Onset  . Heart attack Father   . ADD / ADHD Brother   . Depression Brother   . Depression Cousin     Social History Social History   Tobacco Use  . Smoking status: Current Some Day Smoker    Packs/day: 0.50    Years: 18.00    Pack years: 9.00    Types: Cigarettes  . Smokeless tobacco: Never Used  Substance Use Topics  . Alcohol use: No    Alcohol/week: 0.0 standard drinks  . Drug use: No     Allergies   Gadolinium derivatives, Prednisone, Amitriptyline, Eggs or egg-derived products, Hydrocodone-acetaminophen, Lunesta [eszopiclone], and Tramadol   Review of Systems Review of Systems  Musculoskeletal: Positive for joint swelling. Negative for stiffness.  All other systems reviewed and are negative.    Physical Exam Triage Vital Signs ED Triage Vitals  Enc Vitals Group     BP 01/31/19 0936 126/85     Pulse Rate 01/31/19 0936 87     Resp 01/31/19 0936 16     Temp 01/31/19 0936 98.2 F (36.8 C)     Temp Source 01/31/19 0936 Oral     SpO2 01/31/19 0936 100 %     Weight 01/31/19 0937 137 lb (62.1 kg)     Height --      Head Circumference --      Peak Flow --      Pain Score 01/31/19 0937 9     Pain Loc --      Pain Edu? --      Excl. in [place]? --    No data found.  Updated Vital Signs BP 126/85   Pulse 87   Temp 98.2 F (36.8 C) (Oral)   Resp 16   Wt 62.1 kg   LMP 05/10/2015 (LMP Unknown)   SpO2 100%   BMI 24.27 kg/m   Visual Acuity Right Eye Distance:   Left Eye Distance:   Bilateral Distance:    Right Eye Near:   Left Eye Near:    Bilateral Near:     Physical Exam Constitutional:      General: She is not in acute distress. HENT:     Head: Normocephalic.     Nose: Nose normal.  Eyes:     Pupils: Pupils are equal, round, and reactive to light.  Neck:  Musculoskeletal: Normal range of motion.  Cardiovascular:     Rate and  Rhythm: Normal rate.  Pulmonary:     Effort: Pulmonary effort is normal.  Musculoskeletal:     Right knee: She exhibits normal range of motion, no swelling, no effusion, no ecchymosis, no deformity, no erythema, normal alignment, no LCL laxity, normal meniscus and no MCL laxity. No medial joint line, no lateral joint line, no MCL, no LCL and no patellar tendon tenderness noted.     Right lower leg: No edema.     Left lower leg: No edema.       Legs:     Comments: Right knee:  No effusion, erythema, or warmth. Relatively good range of motion.  Knee stable, negative drawer test.  McMurray test negative.  There is tenderness to palpation over the pes bursa and area of iliotibial band.  Pain is elicited while applying pressure over patella during flexion/extension.  Skin:    General: Skin is warm and dry.  Neurological:     Mental Status: She is alert.      UC Treatments / Results  Labs (all labs ordered are listed, but only abnormal results are displayed) Labs Reviewed - No data to display  EKG   Radiology No results found.  Procedures Procedures (including critical care time)  Medications Ordered in UC Medications - No data to display  Initial Impression / Assessment and Plan / UC Course  I have reviewed the triage vital signs and the nursing notes.  Pertinent labs & imaging results that were available during my care of the patient were reviewed by me and considered in my medical decision making (see chart for details).    Knee x-ray not indicated; patient has had no recent trauma to knee.  Review of records reveals a right knee x-ray done 09/05/16 after a fall on the knee that showed only degenerative changes. Suspect combination of pes bursitis, possible iliotibial band syndrome, and patellofemoral syndrome.  Dispensed knee brace.  Rx for Percocet at bedtime for pain at night (Rx #5, no refill). (she notes that she has had no adverse reaction in the past).  Controlled  Substance Prescriptions I have consulted the [place] Controlled Substances Registry for this patient, and feel the risk/benefit ratio today is favorable for proceeding with this prescription for a controlled substance.   Patient has a scheduled follow-up appt with Dr. Aundria Mems tomorrow.     Final Clinical Impressions(s) / UC Diagnoses   Final diagnoses:  Acute pain of right knee     Discharge Instructions     Wear brace daytime.  Apply ice pack for 20 to 30 minutes, 3 to 4 times daily  Continue until pain and swelling decrease.  Begin Ibuprofen 200mg , 4 tabs every 8 hours with food for 5 to 7 days.  Begin range of motion and stretching exercises as tolerated. Wear knee pads when kneeling.   ED Prescriptions    Medication Sig Dispense Auth. Provider   oxyCODONE-acetaminophen (PERCOCET) 5-325 MG tablet Take one tab PO HS prn knee pain 5 tablet Kandra Nicolas, MD        Kandra Nicolas, MD 01/31/19 [phone]

## 2019-01-31 NOTE — Discharge Instructions (Addendum)
Wear brace daytime.  Apply ice pack for 20 to 30 minutes, 3 to 4 times daily  Continue until pain and swelling decrease.  Begin Ibuprofen 200mg , 4 tabs every 8 hours with food for 5 to 7 days.  Begin range of motion and stretching exercises as tolerated. Wear knee pads when kneeling.

## 2019-02-01 ENCOUNTER — Ambulatory Visit (INDEPENDENT_AMBULATORY_CARE_PROVIDER_SITE_OTHER): Payer: Medicare Other | Admitting: Sports Medicine

## 2019-02-01 DIAGNOSIS — M1711 Unilateral primary osteoarthritis, right knee: Secondary | ICD-10-CM | POA: Diagnosis not present

## 2019-02-01 DIAGNOSIS — M65312 Trigger thumb, left thumb: Secondary | ICD-10-CM

## 2019-02-01 NOTE — Progress Notes (Signed)
Subjective:    CC: Hand pain, knee pain  HPI: Left hand pain: History of trigger thumb on the left, previous injection was about 3-1/2 months ago, now having a recurrence of pain, triggering.  In addition she is having pain in her right knee, medial joint line, moderate, persistent, localized without radiation.  I reviewed the past medical history, family history, social history, surgical history, and allergies today and no changes were needed.  Please see the problem list section below in epic for further details.  Past Medical History: Past Medical History:  Diagnosis Date  . Anxiety   . Asthma   . Complication of anesthesia   . Dysrhythmia    tachycardia  . Elevated heart rate and blood pressure    takes toprol for increased heart rate  . Fibromyalgia   . Headache(784.0)   . Hypertension    no longer takes meds for HTN  . IBS (irritable bowel syndrome)    questionable, per pt; saw Dr. Collene Mares  . IC (interstitial cystitis)   . Insomnia   . PONV (postoperative nausea and vomiting)    Past Surgical History: Past Surgical History:  Procedure Laterality Date  . CARPAL TUNNEL RELEASE Left 06/05/2014   Procedure: LEFT CARPAL TUNNEL RELEASE;  Surgeon: Daryll Brod, MD;  Location: [facility];  Service: Orthopedics;  Laterality: Left;  . CARPAL TUNNEL RELEASE Right 08/25/2014   Procedure: RIGHT CARPAL TUNNEL RELEASE;  Surgeon: Daryll Brod, MD;  Location: [facility];  Service: Orthopedics;  Laterality: Right;  . CARPAL TUNNEL RELEASE Left 07/14/2015   Procedure: LEFT CARPAL TUNNEL RELEASE;  Surgeon: Daryll Brod, MD;  Location: [facility];  Service: Orthopedics;  Laterality: Left;  . CERVICAL SPINE SURGERY  8/15  . CESAREAN SECTION    . DILATION AND EVACUATION N/A 05/07/2013   Procedure: DILATATION AND EVACUATION with tissue sent for chromosome analysis;  Surgeon: Lovenia Kim, MD;  Location: [facility];  Service: Gynecology;  Laterality: N/A;  .  EPIDURAL STEROID INJECTION  03/20/15  . OVARIAN CYST REMOVAL    . PELVIC LAPAROSCOPY  2003   endometriosis  . POSTERIOR CERVICAL FUSION/FORAMINOTOMY N/A 10/28/2014   Procedure: Cervical Four-Cervical Seven Posterior cervical fusion with lateral mass fixation;  Surgeon: Eustace Moore, MD;  Location: [facility];  Service: Neurosurgery;  Laterality: N/A;  posterior  . ROBOTIC ASSISTED LAPAROSCOPIC LYSIS OF ADHESION N/A 07/18/2014   Procedure: ROBOTIC ASSISTED LAPAROSCOPIC  EXCISION POSTERIOR  UTERINE WALL MASS; EXCISION RIGHT MESSALEINGEAL MASS, EXCISION AND ABLATION CULDASAC ENDOMETRIOSIS;  Surgeon: Brien Few, MD;  Location: [facility];  Service: Gynecology;  Laterality: N/A;  . ROBOTIC ASSISTED TOTAL HYSTERECTOMY WITH SALPINGECTOMY Bilateral 05/11/2015   Procedure: ROBOTIC ASSISTED TOTAL HYSTERECTOMY WITH BILATERAL SALPINGECTOMY;  Surgeon: Brien Few, MD;  Location: [facility];  Service: Gynecology;  Laterality: Bilateral;   Social History: Social History   Socioeconomic History  . Marital status: Single    Spouse name: Not on file  . Number of children: 2  . Years of education: Not on file  . Highest education level: Not on file  Occupational History  . Occupation: CMA    Employer: [employer]    Comment: [place]  . Financial resource strain: Not on file  . Food insecurity    Worry: Not on file    Inability: Not on file  . Transportation needs    Medical: Not on file    Non-medical: Not on file  Tobacco Use  .  Smoking status: Current Some Day Smoker    Packs/day: 0.50    Years: 18.00    Pack years: 9.00    Types: Cigarettes  . Smokeless tobacco: Never Used  Substance and Sexual Activity  . Alcohol use: No    Alcohol/week: 0.0 standard drinks  . Drug use: No  . Sexual activity: Yes    Birth control/protection: Surgical  Lifestyle  . Physical activity    Days per week: Not on file    Minutes per session: Not on file  . Stress: Not on file   Relationships  . Social Herbalist on phone: Not on file    Gets together: Not on file    Attends religious service: Not on file    Active member of club or organization: Not on file    Attends meetings of clubs or organizations: Not on file    Relationship status: Not on file  Other Topics Concern  . Not on file  Social History Narrative   Going through separation.  Children Sage and Luisa Hart   Family History: Family History  Problem Relation Age of Onset  . Heart attack Father   . ADD / ADHD Brother   . Depression Brother   . Depression Cousin    Allergies: Allergies  Allergen Reactions  . Gadolinium Derivatives Hives, Itching and Other (See Comments)    After MRI with Mult[facility] finished, patient had redness and itching on chest, tightness in throat.  Patient went to ED to be monitored (04/23/14).  . Prednisone Other (See Comments)    Pain all over, "it triggers my myofascial pain syndrome"   . Amitriptyline Other (See Comments)    Gained weight and does not want to be on.   . Eggs Or Egg-Derived Products Rash  . Hydrocodone-Acetaminophen Itching    Tolerates with benadryl (no reaction to oxycodone)  . Lunesta [Eszopiclone] Other (See Comments)    Metallic taste in mouth  . Tramadol Itching   Medications: See med rec.  Review of Systems: No fevers, chills, night sweats, weight loss, chest pain, or shortness of breath.   Objective:    General: Well Developed, well nourished, and in no acute distress.  Neuro: Alert and oriented x3, extra-ocular muscles intact, sensation grossly intact.  HEENT: Normocephalic, atraumatic, pupils equal round reactive to light, neck supple, no masses, no lymphadenopathy, thyroid nonpalpable.  Skin: Warm and dry, no rashes. Cardiac: Regular rate and rhythm, no murmurs rubs or gallops, no lower extremity edema.  Respiratory: Clear to auscultation bilaterally. Not using accessory muscles, speaking in full sentences. Left hand:  Palpable flexor pollicis longus tendon nodule Right knee: Mild swelling, tenderness at the medial joint line ROM normal in flexion and extension and lower leg rotation. Ligaments with solid consistent endpoints including ACL, PCL, LCL, MCL. Negative Mcmurray's and provocative meniscal tests. Non painful patellar compression. Patellar and quadriceps tendons unremarkable. Hamstring and quadriceps strength is normal.  Procedure: Real-time Ultrasound Guided injection of the right knee Device: GE Logiq E  Verbal informed consent obtained.  Time-out conducted.  Noted no overlying erythema, induration, or other signs of local infection.  Skin prepped in a sterile fashion.  Local anesthesia: Topical Ethyl chloride.  With sterile technique and under real time ultrasound guidance:  1 cc Kenalog 40, 2 cc lidocaine, 2 cc bupivacaine injected easily Completed without difficulty  Pain immediately resolved suggesting accurate placement of the medication.  Advised to call if fevers/chills, erythema, induration, drainage, or persistent bleeding.  Images permanently stored and available for review in the ultrasound unit.  Impression: Technically successful ultrasound guided injection.  Procedure: Real-time Ultrasound Guided injection of the left flexor pollicis longus tendon sheath Device: GE Logiq E  Verbal informed consent obtained.  Time-out conducted.  Noted no overlying erythema, induration, or other signs of local infection.  Skin prepped in a sterile fashion.  Local anesthesia: Topical Ethyl chloride.  With sterile technique and under real time ultrasound guidance:  1/2 cc Kenalog 40, 1/2 cc lidocaine injected easily Completed without difficulty  Pain immediately resolved suggesting accurate placement of the medication.  Advised to call if fevers/chills, erythema, induration, drainage, or persistent bleeding.  Images permanently stored and available for review in the ultrasound unit.   Impression: Technically successful ultrasound guided injection.  Impression and Recommendations:    Primary osteoarthritis of right knee Right knee injection as above, we did inject her past anserine bursa sometime ago. X-rays have shown osteoarthritis.  Trigger thumb, left thumb Repeat flexor pollicis longus tendon sheath injection, previous injection was approximately 3-1/2 months ago.   ___________________________________________ Gwen Her. Dianah Field, M.D., ABFM., CAQSM. Primary Care and Sports Medicine [facility]  Adjunct Professor of [employer]

## 2019-02-01 NOTE — Assessment & Plan Note (Signed)
Repeat flexor pollicis longus tendon sheath injection, previous injection was approximately 3-1/2 months ago.

## 2019-02-01 NOTE — Assessment & Plan Note (Signed)
Right knee injection as above, we did inject her past anserine bursa sometime ago. X-rays have shown osteoarthritis.

## 2019-02-03 ENCOUNTER — Other Ambulatory Visit: Payer: Self-pay | Admitting: Physician Assistant

## 2019-02-03 DIAGNOSIS — F430 Acute stress reaction: Secondary | ICD-10-CM

## 2019-02-03 DIAGNOSIS — T887XXA Unspecified adverse effect of drug or medicament, initial encounter: Secondary | ICD-10-CM

## 2019-02-03 DIAGNOSIS — F411 Generalized anxiety disorder: Secondary | ICD-10-CM

## 2019-02-04 NOTE — Telephone Encounter (Signed)
Please advise 

## 2019-02-15 ENCOUNTER — Ambulatory Visit (INDEPENDENT_AMBULATORY_CARE_PROVIDER_SITE_OTHER): Payer: Medicare Other | Admitting: Sports Medicine

## 2019-02-15 ENCOUNTER — Other Ambulatory Visit: Payer: Self-pay

## 2019-02-15 ENCOUNTER — Ambulatory Visit (INDEPENDENT_AMBULATORY_CARE_PROVIDER_SITE_OTHER): Payer: Medicare Other

## 2019-02-15 ENCOUNTER — Encounter: Payer: Self-pay | Admitting: Sports Medicine

## 2019-02-15 DIAGNOSIS — M170 Bilateral primary osteoarthritis of knee: Secondary | ICD-10-CM | POA: Diagnosis not present

## 2019-02-15 DIAGNOSIS — M25461 Effusion, right knee: Secondary | ICD-10-CM

## 2019-02-15 DIAGNOSIS — M1712 Unilateral primary osteoarthritis, left knee: Secondary | ICD-10-CM

## 2019-02-15 DIAGNOSIS — W540XXA Bitten by dog, initial encounter: Secondary | ICD-10-CM

## 2019-02-15 DIAGNOSIS — M1711 Unilateral primary osteoarthritis, right knee: Secondary | ICD-10-CM

## 2019-02-15 DIAGNOSIS — M25561 Pain in right knee: Secondary | ICD-10-CM

## 2019-02-15 MED ORDER — DOXYCYCLINE HYCLATE 100 MG PO TABS: 100.0000 mg | ORAL_TABLET | Freq: Two times a day (BID) | ORAL | 0 refills | Status: AC

## 2019-02-15 NOTE — Assessment & Plan Note (Addendum)
Persistent right knee pain in spite of injection, conservative measures, adding an x-ray and an MRI.  There is proximal patellar tendinosis, because of this it is reasonable to switch to a Cho-Pat strap with topical nitroglycerin for a few weeks and if no improvement with this we will consider a PRP injection at the proximal patellar tendon.

## 2019-02-15 NOTE — Assessment & Plan Note (Signed)
Accidental dog bite on the left forearm, neurovascularly intact distally. Adding doxycycline. Up-to-date on tetanus.

## 2019-02-15 NOTE — Progress Notes (Addendum)
Subjective:    CC: Follow-up  HPI: [name] returns, we injected her right knee recently, she continues to have pain, swelling, anteriorly.  In addition she broke up a fight between her dogs recently, she was bit on the left arm.  I reviewed the past medical history, family history, social history, surgical history, and allergies today and no changes were needed.  Please see the problem list section below in epic for further details.  Past Medical History: Past Medical History:  Diagnosis Date  . Anxiety   . Asthma   . Complication of anesthesia   . Dysrhythmia    tachycardia  . Elevated heart rate and blood pressure    takes toprol for increased heart rate  . Fibromyalgia   . Headache(784.0)   . Hypertension    no longer takes meds for HTN  . IBS (irritable bowel syndrome)    questionable, per pt; saw Dr. Collene Mares  . IC (interstitial cystitis)   . Insomnia   . PONV (postoperative nausea and vomiting)    Past Surgical History: Past Surgical History:  Procedure Laterality Date  . CARPAL TUNNEL RELEASE Left 06/05/2014   Procedure: LEFT CARPAL TUNNEL RELEASE;  Surgeon: Daryll Brod, MD;  Location: [place];  Service: Orthopedics;  Laterality: Left;  . CARPAL TUNNEL RELEASE Right 08/25/2014   Procedure: RIGHT CARPAL TUNNEL RELEASE;  Surgeon: Daryll Brod, MD;  Location: [place];  Service: Orthopedics;  Laterality: Right;  . CARPAL TUNNEL RELEASE Left 07/14/2015   Procedure: LEFT CARPAL TUNNEL RELEASE;  Surgeon: Daryll Brod, MD;  Location: [place];  Service: Orthopedics;  Laterality: Left;  . CERVICAL SPINE SURGERY  8/15  . CESAREAN SECTION    . DILATION AND EVACUATION N/A 05/07/2013   Procedure: DILATATION AND EVACUATION with tissue sent for chromosome analysis;  Surgeon: Lovenia Kim, MD;  Location: [facility];  Service: Gynecology;  Laterality: N/A;  . EPIDURAL STEROID INJECTION  03/20/15  . OVARIAN CYST REMOVAL    . PELVIC  LAPAROSCOPY  2003   endometriosis  . POSTERIOR CERVICAL FUSION/FORAMINOTOMY N/A 10/28/2014   Procedure: Cervical Four-Cervical Seven Posterior cervical fusion with lateral mass fixation;  Surgeon: Eustace Moore, MD;  Location: [facility] [place] [facility];  Service: Neurosurgery;  Laterality: N/A;  posterior  . ROBOTIC ASSISTED LAPAROSCOPIC LYSIS OF ADHESION N/A 07/18/2014   Procedure: ROBOTIC ASSISTED LAPAROSCOPIC  EXCISION POSTERIOR  UTERINE WALL MASS; EXCISION RIGHT MESSALEINGEAL MASS, EXCISION AND ABLATION CULDASAC ENDOMETRIOSIS;  Surgeon: Brien Few, MD;  Location: [facility];  Service: Gynecology;  Laterality: N/A;  . ROBOTIC ASSISTED TOTAL HYSTERECTOMY WITH SALPINGECTOMY Bilateral 05/11/2015   Procedure: ROBOTIC ASSISTED TOTAL HYSTERECTOMY WITH BILATERAL SALPINGECTOMY;  Surgeon: Brien Few, MD;  Location: [facility];  Service: Gynecology;  Laterality: Bilateral;   Social History: Social History   Socioeconomic History  . Marital status: Single    Spouse name: Not on file  . Number of children: 2  . Years of education: Not on file  . Highest education level: Not on file  Occupational History  . Occupation: CMA    Employer: [employer]    Comment: [place]  . Financial resource strain: Not on file  . Food insecurity    Worry: Not on file    Inability: Not on file  . Transportation needs    Medical: Not on file    Non-medical: Not on file  Tobacco Use  . Smoking status: Current Some Day Smoker    Packs/day:  0.50    Years: 18.00    Pack years: 9.00    Types: Cigarettes  . Smokeless tobacco: Never Used  Substance and Sexual Activity  . Alcohol use: No    Alcohol/week: 0.0 standard drinks  . Drug use: No  . Sexual activity: Yes    Birth control/protection: Surgical  Lifestyle  . Physical activity    Days per week: Not on file    Minutes per session: Not on file  . Stress: Not on file  Relationships  . Social Herbalist on phone: Not on file    Gets  together: Not on file    Attends religious service: Not on file    Active member of club or organization: Not on file    Attends meetings of clubs or organizations: Not on file    Relationship status: Not on file  Other Topics Concern  . Not on file  Social History Narrative   Going through separation.  Children Sage and Luisa Hart   Family History: Family History  Problem Relation Age of Onset  . Heart attack Father   . ADD / ADHD Brother   . Depression Brother   . Depression Cousin    Allergies: Allergies  Allergen Reactions  . Gadolinium Derivatives Hives, Itching and Other (See Comments)    After MRI with Multihance finished, patient had redness and itching on chest, tightness in throat.  Patient went to ED to be monitored (04/23/14).  . Prednisone Other (See Comments)    Pain all over, "it triggers my myofascial pain syndrome"   . Amitriptyline Other (See Comments)    Gained weight and does not want to be on.   . Eggs Or Egg-Derived Products Rash  . Hydrocodone-Acetaminophen Itching    Tolerates with benadryl (no reaction to oxycodone)  . Lunesta [Eszopiclone] Other (See Comments)    Metallic taste in mouth  . Tramadol Itching   Medications: See med rec.  Review of Systems: No fevers, chills, night sweats, weight loss, chest pain, or shortness of breath.   Objective:    General: Well Developed, well nourished, and in no acute distress.  Neuro: Alert and oriented x3, extra-ocular muscles intact, sensation grossly intact.  HEENT: Normocephalic, atraumatic, pupils equal round reactive to light, neck supple, no masses, no lymphadenopathy, thyroid nonpalpable.  Skin: Warm and dry, no rashes. Cardiac: Regular rate and rhythm, no murmurs rubs or gallops, no lower extremity edema.  Respiratory: Clear to auscultation bilaterally. Not using accessory muscles, speaking in full sentences. Left arm: There are some small abrasions on the left forearm with a bit of swelling and  erythema.  Good function distally.  Impression and Recommendations:    Primary osteoarthritis of right knee Persistent right knee pain in spite of injection, conservative measures, adding an x-ray and an MRI.  There is proximal patellar tendinosis, because of this it is reasonable to switch to a Cho-Pat strap with topical nitroglycerin for a few weeks and if no improvement with this we will consider a PRP injection at the proximal patellar tendon.   Dog bite Accidental dog bite on the left forearm, neurovascularly intact distally. Adding doxycycline. Up-to-date on tetanus.  I spent 25 minutes with this patient, greater than 50% was face-to-face time counseling regarding the above diagnoses.  ___________________________________________ Gwen Her. Dianah Field, M.D., ABFM., CAQSM. Primary Care and Sports Medicine [place] [facility]  Adjunct Professor of [place] of [facility] of [employer]

## 2019-02-17 ENCOUNTER — Other Ambulatory Visit: Payer: Self-pay

## 2019-02-17 ENCOUNTER — Ambulatory Visit (INDEPENDENT_AMBULATORY_CARE_PROVIDER_SITE_OTHER): Payer: Medicare Other

## 2019-02-17 DIAGNOSIS — M7601 Gluteal tendinitis, right hip: Secondary | ICD-10-CM | POA: Diagnosis not present

## 2019-02-17 DIAGNOSIS — M25561 Pain in right knee: Secondary | ICD-10-CM | POA: Diagnosis not present

## 2019-02-19 ENCOUNTER — Other Ambulatory Visit: Payer: Self-pay

## 2019-02-19 ENCOUNTER — Ambulatory Visit (INDEPENDENT_AMBULATORY_CARE_PROVIDER_SITE_OTHER): Payer: Medicare Other | Admitting: Sports Medicine

## 2019-02-19 DIAGNOSIS — M67869 Other specified disorders of synovium and tendon, unspecified knee: Secondary | ICD-10-CM | POA: Diagnosis not present

## 2019-02-19 DIAGNOSIS — M7650 Patellar tendinitis, unspecified knee: Secondary | ICD-10-CM

## 2019-02-19 MED ORDER — NITROGLYCERIN 0.2 MG/HR TD PT24: MEDICATED_PATCH | TRANSDERMAL | 11 refills | Status: DC

## 2019-02-19 NOTE — Progress Notes (Signed)
Pt came into clinic and was fitted for patellar strap brace. She was advised by Provider to wear it for 6 weeks and to exercise as tolerated. She has not picked up Rx's from the pharmacy but will get them soon and start. No further questions.

## 2019-02-19 NOTE — Addendum Note (Signed)
Addended by: Silverio Decamp on: 02/19/2019 10:51 AM   Modules accepted: Orders

## 2019-02-21 ENCOUNTER — Encounter: Payer: Self-pay | Admitting: Sports Medicine

## 2019-03-04 ENCOUNTER — Other Ambulatory Visit: Payer: Self-pay

## 2019-03-04 ENCOUNTER — Ambulatory Visit (INDEPENDENT_AMBULATORY_CARE_PROVIDER_SITE_OTHER): Payer: Medicare Other

## 2019-03-04 ENCOUNTER — Encounter: Payer: Self-pay | Admitting: Physician Assistant

## 2019-03-04 ENCOUNTER — Telehealth: Payer: Self-pay | Admitting: Neurology

## 2019-03-04 ENCOUNTER — Ambulatory Visit (INDEPENDENT_AMBULATORY_CARE_PROVIDER_SITE_OTHER): Payer: Medicare Other | Admitting: Physician Assistant

## 2019-03-04 VITALS — BP 115/48 | HR 106 | Temp 98.5°F | Ht 63.0 in | Wt 145.0 lb

## 2019-03-04 DIAGNOSIS — R82998 Other abnormal findings in urine: Secondary | ICD-10-CM

## 2019-03-04 DIAGNOSIS — R1011 Right upper quadrant pain: Secondary | ICD-10-CM

## 2019-03-04 DIAGNOSIS — N83202 Unspecified ovarian cyst, left side: Secondary | ICD-10-CM

## 2019-03-04 DIAGNOSIS — R109 Unspecified abdominal pain: Secondary | ICD-10-CM

## 2019-03-04 DIAGNOSIS — R3129 Other microscopic hematuria: Secondary | ICD-10-CM | POA: Diagnosis not present

## 2019-03-04 LAB — POCT URINALYSIS DIPSTICK
Bilirubin, UA: NEGATIVE
Glucose, UA: NEGATIVE
Ketones, UA: NEGATIVE
Leukocytes, UA: NEGATIVE
Nitrite, UA: NEGATIVE
Protein, UA: POSITIVE — AB
Spec Grav, UA: >=1.03 — AB (ref 1.010–1.025)
Urobilinogen, UA: 0.2 E.U./dL
pH, UA: 6 (ref 5.0–8.0)

## 2019-03-04 MED ORDER — NITROFURANTOIN MONOHYD MACRO 100 MG PO CAPS: 100.0000 mg | ORAL_CAPSULE | Freq: Two times a day (BID) | ORAL | 0 refills | Status: DC

## 2019-03-04 MED ORDER — HYDROCODONE-ACETAMINOPHEN 5-325 MG PO TABS: 1.0000 | ORAL_TABLET | Freq: Four times a day (QID) | ORAL | 0 refills | Status: DC | PRN

## 2019-03-04 MED ORDER — HYDROCODONE-ACETAMINOPHEN 5-325 MG PO TABS: 1.0000 | ORAL_TABLET | Freq: Four times a day (QID) | ORAL | 0 refills | Status: AC | PRN

## 2019-03-04 MED ORDER — TAMSULOSIN HCL 0.4 MG PO CAPS: 0.4000 mg | ORAL_CAPSULE | Freq: Every day | ORAL | 0 refills | Status: DC

## 2019-03-04 MED ORDER — KETOROLAC TROMETHAMINE 60 MG/2ML IM SOLN
60.0000 mg | Freq: Once | INTRAMUSCULAR | Status: AC
  Administered 2019-03-04: 60 mg via INTRAMUSCULAR

## 2019-03-04 NOTE — Progress Notes (Signed)
Subjective:    Patient ID: [name], female    DOB: 05/25/1975, 43 y.o.   MRN: [id number]  HPI  Pt is a 43 yo female with sudden right flank pain that woke her up Saturday morning, 3 days ago. Pain is constant with waves of more acute more severe pain. She denies any pain with urination. She had fever Saturday night and felt like having chills. She rates sharp waves of pain off the chart. She has never had any kidney stones. Sitting, standing, turning takes breath away. At times the pain radiates down into right buttocks and right posterior thigh. She denies any injury to low back. She does have some right knee pain from patellar tendinosis that Dr. Darene Lamer, sports medicine is treating. She is taking ibuprofen which does not make better. She is nauseated due to pain.   . Active Ambulatory Problems    Diagnosis Date Noted  . Anxiety state 07/06/2007  . Smoker 02/05/2010  . Depressed 06/26/2008  . INTERSTITIAL CYSTITIS 02/05/2010  . Insomnia 07/06/2007  . FATIGUE 07/02/2009  . HEADACHE 07/06/2007  . TROCHANTERIC BURSITIS 06/21/2010  . Tick-borne disease 08/09/2010  . PTSD (post-traumatic stress disorder) 12/14/2012  . Weight gain 11/29/2013  . Greater trochanteric bursitis 11/29/2013  . Constipation due to opioid therapy 01/17/2014  . Left shoulder pain 04/02/2014  . Carpal tunnel syndrome of left wrist 05/05/2014  . Myofascial pain syndrome 05/29/2014  . Paresthesia 06/03/2014  . Endometriosis 07/20/2014  . Tachycardia 08/13/2014  . S/P cervical spinal fusion 10/28/2014  . Therapeutic opioid induced constipation 12/16/2014  . Vasomotor flushing 06/15/2015  . GAD (generalized anxiety disorder) 06/15/2015  . Fixed pupils 08/31/2015  . Dysuria 08/31/2015  . Depression 08/31/2015  . Primary osteoarthritis of left first metacarpophalangeal joint 04/04/2016  . Palpitations 04/14/2016  . Balance problems 07/19/2016  . Dizziness 07/19/2016  . Primary osteoarthritis of right  knee 09/05/2016  . Trigger thumb, left thumb 10/06/2016  . Chronic pain syndrome 01/08/2017  . 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  . First degree ankle sprain, left, initial encounter 08/29/2017  . Hypertension 10/06/2017  . Hx of cold sores 10/06/2017  . Asthma 01/29/2018  . Chronic neck pain 01/29/2018  . Traumatic ecchymosis of left foot 10/15/2018  . Mild episode of recurrent major depressive disorder ([place]) 10/30/2018  . Dog bite 02/15/2019   Resolved Ambulatory Problems    Diagnosis Date Noted  . RHINITIS 10/13/2009  . Polyuria 11/04/2010  . Degenerative disc disease, cervical 10/31/2013  . Cervical disc disorder with radiculopathy of cervical region 11/29/2013  . Itching 11/29/2013  . Radiculitis of left cervical region 01/17/2014  . Nausea with vomiting 11/25/2014  . Dysmenorrhea 05/11/2015  . Cervical disc disorder with radiculopathy of cervical region 11/11/2015  . Acute upper respiratory infection 04/04/2016  . Localized primary carpometacarpal osteoarthritis, left 04/04/2016   Past Medical History:  Diagnosis Date  . Anxiety   . Complication of anesthesia   . Dysrhythmia   . Elevated heart rate and blood pressure   . Fibromyalgia   . IBS (irritable bowel syndrome)   . IC (interstitial cystitis)   . PONV (postoperative nausea and vomiting)        Review of Systems See HPI.     Objective:   Physical Exam Vitals signs reviewed.  Constitutional:      Comments: Acute pain.  HENT:     Head: Normocephalic.  Cardiovascular:     Rate and Rhythm: Regular rhythm. Tachycardia present.  Pulses: Normal pulses.  Pulmonary:     Effort: Pulmonary effort is normal.     Breath sounds: No wheezing or rhonchi.  Abdominal:     General: Bowel sounds are normal. There is no distension.     Palpations: Abdomen is soft.     Tenderness: There is no abdominal tenderness. There is right CVA tenderness. There is no left CVA tenderness, guarding or rebound.     Hernia: No hernia is  present.  Musculoskeletal:     Comments: No tenderness to palpation over lumbar spine. Rightness of the right paraspinal muscles to palpation.   Neurological:     General: No focal deficit present.     Mental Status: She is alert and oriented to person, place, and time.  Psychiatric:        Mood and Affect: Mood normal.           Assessment & Plan:  .Marland KitchenKetti was seen today for back pain.  Diagnoses and all orders for this visit:  Right flank pain -     tamsulosin (FLOMAX) 0.4 MG CAPS capsule; Take 1 capsule (0.4 mg total) by mouth daily. -     nitrofurantoin, macrocrystal-monohydrate, (MACROBID) 100 MG capsule; Take 1 capsule (100 mg total) by mouth 2 (two) times daily. -     HYDROcodone-acetaminophen (NORCO/VICODIN) 5-325 MG tablet; Take 1 tablet by mouth every 6 (six) hours as needed for up to 5 days for moderate pain. -     ketorolac (TORADOL) injection 60 mg -     POCT Urinalysis Dipstick  Right upper quadrant abdominal pain -     CT RENAL STONE STUDY  Other microscopic hematuria -     nitrofurantoin, macrocrystal-monohydrate, (MACROBID) 100 MG capsule; Take 1 capsule (100 mg total) by mouth 2 (two) times daily.  Leukocytes in urine -     nitrofurantoin, macrocrystal-monohydrate, (MACROBID) 100 MG capsule; Take 1 capsule (100 mg total) by mouth 2 (two) times daily.  Left ovarian cyst  Other orders -     Discontinue: HYDROcodone-acetaminophen (NORCO/VICODIN) 5-325 MG tablet; Take 1 tablet by mouth every 6 (six) hours as needed for up to 5 days for moderate pain.   [name] Results for orders placed or performed in visit on 03/04/19  POCT Urinalysis Dipstick  Result Value Ref Range   Color, UA yellow    Clarity, UA clear    Glucose, UA Negative Negative   Bilirubin, UA negative    Ketones, UA negative    Spec Grav, UA >=1.030 (A) 1.010 - 1.025   Blood, UA small    pH, UA 6.0 5.0 - 8.0   Protein, UA Positive (A) Negative   Urobilinogen, UA 0.2 0.2 or 1.0 E.U./dL    Nitrite, UA negative    Leukocytes, UA Negative Negative   Appearance     Odor     Not enough urine to culture.  Will treat empirically for kidney stones and/or UTI.  Start flomax and macrobid.  STAT CT ordered to rule out kidney stones.  Small quantity of norco for severe pain.  [name].PDMP reviewed during this encounter. No recent pain mediation given.   CT negative for stones. Left ovarian cyst. I do not believe causing pain will get u/s to follow up on it.  STOP flomax.  Renal and bladder normal. Ok to finish macrobid.  Suspect pain must be musculoskeletal. Follow up with Dr. Darene Lamer.  Warm compresses and stretches.  Recheck urine in 1 week.

## 2019-03-04 NOTE — Patient Instructions (Signed)

## 2019-03-04 NOTE — Telephone Encounter (Signed)
CT results received. Per Luvenia Starch "left cyst: okay for ultrasound. No explaination for pain. Probably musculoskeletal. Stop Flomax. Use heat/ice. Follow up with Dr. Darene Lamer if no better". Patient made aware. Ultrasound ordered.

## 2019-03-06 ENCOUNTER — Other Ambulatory Visit: Payer: Self-pay

## 2019-03-06 ENCOUNTER — Ambulatory Visit (INDEPENDENT_AMBULATORY_CARE_PROVIDER_SITE_OTHER): Payer: Medicare Other

## 2019-03-06 DIAGNOSIS — N83202 Unspecified ovarian cyst, left side: Secondary | ICD-10-CM | POA: Diagnosis not present

## 2019-03-07 ENCOUNTER — Encounter: Payer: Self-pay | Admitting: Physician Assistant

## 2019-03-07 NOTE — Progress Notes (Signed)
[name],   You need to be seen by GYN for further evaluation. Do you have a GYN that you go to or should I make a referral?

## 2019-03-11 ENCOUNTER — Other Ambulatory Visit: Payer: Self-pay | Admitting: Physician Assistant

## 2019-03-11 DIAGNOSIS — R109 Unspecified abdominal pain: Secondary | ICD-10-CM

## 2019-03-12 DIAGNOSIS — N83202 Unspecified ovarian cyst, left side: Secondary | ICD-10-CM | POA: Diagnosis not present

## 2019-03-12 DIAGNOSIS — R1031 Right lower quadrant pain: Secondary | ICD-10-CM | POA: Diagnosis not present

## 2019-03-15 ENCOUNTER — Encounter: Payer: Self-pay | Admitting: Sports Medicine

## 2019-03-15 ENCOUNTER — Ambulatory Visit (INDEPENDENT_AMBULATORY_CARE_PROVIDER_SITE_OTHER): Payer: Medicare Other | Admitting: Sports Medicine

## 2019-03-15 ENCOUNTER — Other Ambulatory Visit: Payer: Self-pay

## 2019-03-15 DIAGNOSIS — M1711 Unilateral primary osteoarthritis, right knee: Secondary | ICD-10-CM

## 2019-03-15 DIAGNOSIS — M65312 Trigger thumb, left thumb: Secondary | ICD-10-CM | POA: Diagnosis not present

## 2019-03-15 NOTE — Progress Notes (Signed)
Subjective:    CC: Follow-up  HPI: Trigger thumb: Resolved after injection  Right knee pain: Did not respond to intra-articular injection, MRI showed proximal patellar tendinosis and Hoffa's fat pad syndrome, pain today was referrable more to the fat pad.  I reviewed the past medical history, family history, social history, surgical history, and allergies today and no changes were needed.  Please see the problem list section below in epic for further details.  Past Medical History: Past Medical History:  Diagnosis Date  . Anxiety   . Asthma   . Complication of anesthesia   . Dysrhythmia    tachycardia  . Elevated heart rate and blood pressure    takes toprol for increased heart rate  . Fibromyalgia   . Headache(784.0)   . Hypertension    no longer takes meds for HTN  . IBS (irritable bowel syndrome)    questionable, per pt; saw Dr. Collene Mares  . IC (interstitial cystitis)   . Insomnia   . PONV (postoperative nausea and vomiting)    Past Surgical History: Past Surgical History:  Procedure Laterality Date  . CARPAL TUNNEL RELEASE Left 06/05/2014   Procedure: LEFT CARPAL TUNNEL RELEASE;  Surgeon: Daryll Brod, MD;  Location: [place];  Service: Orthopedics;  Laterality: Left;  . CARPAL TUNNEL RELEASE Right 08/25/2014   Procedure: RIGHT CARPAL TUNNEL RELEASE;  Surgeon: Daryll Brod, MD;  Location: [place];  Service: Orthopedics;  Laterality: Right;  . CARPAL TUNNEL RELEASE Left 07/14/2015   Procedure: LEFT CARPAL TUNNEL RELEASE;  Surgeon: Daryll Brod, MD;  Location: [place];  Service: Orthopedics;  Laterality: Left;  . CERVICAL SPINE SURGERY  8/15  . CESAREAN SECTION    . DILATION AND EVACUATION N/A 05/07/2013   Procedure: DILATATION AND EVACUATION with tissue sent for chromosome analysis;  Surgeon: Lovenia Kim, MD;  Location: [facility];  Service: Gynecology;  Laterality: N/A;  . EPIDURAL STEROID INJECTION  03/20/15  . OVARIAN CYST  REMOVAL    . PELVIC LAPAROSCOPY  2003   endometriosis  . POSTERIOR CERVICAL FUSION/FORAMINOTOMY N/A 10/28/2014   Procedure: Cervical Four-Cervical Seven Posterior cervical fusion with lateral mass fixation;  Surgeon: Eustace Moore, MD;  Location: [place] [facility];  Service: Neurosurgery;  Laterality: N/A;  posterior  . ROBOTIC ASSISTED LAPAROSCOPIC LYSIS OF ADHESION N/A 07/18/2014   Procedure: ROBOTIC ASSISTED LAPAROSCOPIC  EXCISION POSTERIOR  UTERINE WALL MASS; EXCISION RIGHT MESSALEINGEAL MASS, EXCISION AND ABLATION CULDASAC ENDOMETRIOSIS;  Surgeon: Brien Few, MD;  Location: [facility];  Service: Gynecology;  Laterality: N/A;  . ROBOTIC ASSISTED TOTAL HYSTERECTOMY WITH SALPINGECTOMY Bilateral 05/11/2015   Procedure: ROBOTIC ASSISTED TOTAL HYSTERECTOMY WITH BILATERAL SALPINGECTOMY;  Surgeon: Brien Few, MD;  Location: [place];  Service: Gynecology;  Laterality: Bilateral;   Social History: Social History   Socioeconomic History  . Marital status: Single    Spouse name: Not on file  . Number of children: 2  . Years of education: Not on file  . Highest education level: Not on file  Occupational History  . Occupation: CMA    Employer: [employer]    Comment: [place]  . Financial resource strain: Not on file  . Food insecurity    Worry: Not on file    Inability: Not on file  . Transportation needs    Medical: Not on file    Non-medical: Not on file  Tobacco Use  . Smoking status: Current Some Day Smoker    Packs/day:  0.50    Years: 18.00    Pack years: 9.00    Types: Cigarettes  . Smokeless tobacco: Never Used  Substance and Sexual Activity  . Alcohol use: No    Alcohol/week: 0.0 standard drinks  . Drug use: No  . Sexual activity: Yes    Birth control/protection: Surgical  Lifestyle  . Physical activity    Days per week: Not on file    Minutes per session: Not on file  . Stress: Not on file  Relationships  . Social Herbalist on phone: Not  on file    Gets together: Not on file    Attends religious service: Not on file    Active member of club or organization: Not on file    Attends meetings of clubs or organizations: Not on file    Relationship status: Not on file  Other Topics Concern  . Not on file  Social History Narrative   Going through separation.  Children Sage and Luisa Hart   Family History: Family History  Problem Relation Age of Onset  . Heart attack Father   . ADD / ADHD Brother   . Depression Brother   . Depression Cousin    Allergies: Allergies  Allergen Reactions  . Gadolinium Derivatives Hives, Itching and Other (See Comments)    After MRI with Multihance finished, patient had redness and itching on chest, tightness in throat.  Patient went to ED to be monitored (04/23/14).  . Prednisone Other (See Comments)    Pain all over, "it triggers my myofascial pain syndrome"   . Amitriptyline Other (See Comments)    Gained weight and does not want to be on.   . Eggs Or Egg-Derived Products Rash  . Hydrocodone-Acetaminophen Itching    Tolerates with benadryl (no reaction to oxycodone)  . Lunesta [Eszopiclone] Other (See Comments)    Metallic taste in mouth  . Tramadol Itching   Medications: See med rec.  Review of Systems: No fevers, chills, night sweats, weight loss, chest pain, or shortness of breath.   Objective:    General: Well Developed, well nourished, and in no acute distress.  Neuro: Alert and oriented x3, extra-ocular muscles intact, sensation grossly intact.  HEENT: Normocephalic, atraumatic, pupils equal round reactive to light, neck supple, no masses, no lymphadenopathy, thyroid nonpalpable.  Skin: Warm and dry, no rashes. Cardiac: Regular rate and rhythm, no murmurs rubs or gallops, no lower extremity edema.  Respiratory: Clear to auscultation bilaterally. Not using accessory muscles, speaking in full sentences. Right knee: Normal to inspection with no erythema or effusion or obvious bony  abnormalities. Tender to palpation at medial Hoffa's fat pad, only minimal nonconcordant tenderness at the proximal patellar tendon. ROM normal in flexion and extension and lower leg rotation. Ligaments with solid consistent endpoints including ACL, PCL, LCL, MCL. Negative Mcmurray's and provocative meniscal tests. Non painful patellar compression. Patellar and quadriceps tendons unremarkable. Hamstring and quadriceps strength is normal.  Procedure: Real-time Ultrasound Guided injection of the right Hoffa's fat pad Device: Samsung HS60  Verbal informed consent obtained.  Time-out conducted.  Noted no overlying erythema, induration, or other signs of local infection.  Skin prepped in a sterile fashion.  Local anesthesia: Topical Ethyl chloride.  With sterile technique and under real time ultrasound guidance: 1 cc Kenalog 40, 2 cc lidocaine, 2 cc bupivacaine injected easily in a fanlike pattern into Hoffa's fat pad taking care to avoid injection into the patellar tendon. Completed without difficulty  Pain immediately  resolved suggesting accurate placement of the medication.  Advised to call if fevers/chills, erythema, induration, drainage, or persistent bleeding.  Images permanently stored and available for review in the ultrasound unit.  Impression: Technically successful ultrasound guided injection. Recheck knee pain Impression and Recommendations:    Primary osteoarthritis of right knee Persistent pain in spite of knee joint injection, MRI showed patellar tendinosis as well as Hoffa's fat pad syndrome. She did not respond to 1 month of Cho-Pat strap immobilization, topical nitroglycerin with rehabilitation, no improvement whatsoever. Today we peppered the Hoffa's fat pad with triamcinolone, return to see me in a month. If this fails I would like her to see one of my surgical colleagues for arthroscopic examination.  Trigger thumb, left thumb Left FPL tendon sheath injection at the  last visit has resolved THUMB TRIGGERING.   ___________________________________________ Gwen Her. Dianah Field, M.D., ABFM., CAQSM. Primary Care and Sports Medicine [place] [facility]  Adjunct Professor of [place] of [facility] of Medicine

## 2019-03-15 NOTE — Assessment & Plan Note (Signed)
Persistent pain in spite of knee joint injection, MRI showed patellar tendinosis as well as Hoffa's fat pad syndrome. She did not respond to 1 month of Cho-Pat strap immobilization, topical nitroglycerin with rehabilitation, no improvement whatsoever. Today we peppered the Hoffa's fat pad with triamcinolone, return to see me in a month. If this fails I would like her to see one of my surgical colleagues for arthroscopic examination.

## 2019-03-15 NOTE — Assessment & Plan Note (Signed)
Left FPL tendon sheath injection at the last visit has resolved THUMB TRIGGERING.

## 2019-03-17 ENCOUNTER — Other Ambulatory Visit: Payer: Self-pay | Admitting: Physician Assistant

## 2019-03-17 DIAGNOSIS — A6000 Herpesviral infection of urogenital system, unspecified: Secondary | ICD-10-CM

## 2019-03-17 DIAGNOSIS — I10 Essential (primary) hypertension: Secondary | ICD-10-CM

## 2019-03-17 DIAGNOSIS — Z8619 Personal history of other infectious and parasitic diseases: Secondary | ICD-10-CM

## 2019-03-17 DIAGNOSIS — R002 Palpitations: Secondary | ICD-10-CM

## 2019-03-18 ENCOUNTER — Encounter: Payer: Self-pay | Admitting: Sports Medicine

## 2019-03-18 DIAGNOSIS — M1711 Unilateral primary osteoarthritis, right knee: Secondary | ICD-10-CM

## 2019-03-19 MED ORDER — TRAMADOL HCL 50 MG PO TABS: 50.0000 mg | ORAL_TABLET | Freq: Three times a day (TID) | ORAL | 0 refills | Status: DC | PRN

## 2019-03-20 ENCOUNTER — Other Ambulatory Visit: Payer: Self-pay | Admitting: Physician Assistant

## 2019-03-20 DIAGNOSIS — R002 Palpitations: Secondary | ICD-10-CM

## 2019-03-25 ENCOUNTER — Other Ambulatory Visit: Payer: Self-pay | Admitting: Physician Assistant

## 2019-03-25 DIAGNOSIS — M222X1 Patellofemoral disorders, right knee: Secondary | ICD-10-CM | POA: Diagnosis not present

## 2019-03-25 DIAGNOSIS — M25561 Pain in right knee: Secondary | ICD-10-CM | POA: Diagnosis not present

## 2019-03-25 DIAGNOSIS — R109 Unspecified abdominal pain: Secondary | ICD-10-CM

## 2019-03-30 ENCOUNTER — Other Ambulatory Visit: Payer: Self-pay | Admitting: Physician Assistant

## 2019-03-30 DIAGNOSIS — F5101 Primary insomnia: Secondary | ICD-10-CM

## 2019-03-30 DIAGNOSIS — G43009 Migraine without aura, not intractable, without status migrainosus: Secondary | ICD-10-CM

## 2019-03-30 DIAGNOSIS — R002 Palpitations: Secondary | ICD-10-CM

## 2019-04-01 NOTE — Telephone Encounter (Signed)
Ambien : Last filled 09/18/2018 #30 with 5 refills.

## 2019-04-02 DIAGNOSIS — M4802 Spinal stenosis, cervical region: Secondary | ICD-10-CM | POA: Diagnosis not present

## 2019-04-02 DIAGNOSIS — M222X9 Patellofemoral disorders, unspecified knee: Secondary | ICD-10-CM | POA: Diagnosis not present

## 2019-04-02 DIAGNOSIS — S83241A Other tear of medial meniscus, current injury, right knee, initial encounter: Secondary | ICD-10-CM | POA: Diagnosis not present

## 2019-04-02 DIAGNOSIS — M961 Postlaminectomy syndrome, not elsewhere classified: Secondary | ICD-10-CM | POA: Diagnosis not present

## 2019-04-02 DIAGNOSIS — M222X1 Patellofemoral disorders, right knee: Secondary | ICD-10-CM | POA: Diagnosis not present

## 2019-04-02 DIAGNOSIS — G894 Chronic pain syndrome: Secondary | ICD-10-CM | POA: Diagnosis not present

## 2019-04-02 DIAGNOSIS — E65 Localized adiposity: Secondary | ICD-10-CM | POA: Diagnosis not present

## 2019-04-02 DIAGNOSIS — Z79899 Other long term (current) drug therapy: Secondary | ICD-10-CM | POA: Diagnosis not present

## 2019-04-05 ENCOUNTER — Other Ambulatory Visit: Payer: Self-pay

## 2019-04-05 ENCOUNTER — Encounter (HOSPITAL_BASED_OUTPATIENT_CLINIC_OR_DEPARTMENT_OTHER): Payer: Self-pay | Admitting: *Deleted

## 2019-04-05 NOTE — Progress Notes (Signed)
Spoke w/ via phone for pre-op interview---[name] needs dos----  none            Flynn results------ COVID test ------04-06-2019 900 am  Arrive at -------1030 04-09-2019 NPO after ------midnight food, clear liquids until 930 am then npo Medications to take morning of surgery -----breo ellipta inhaler prn/bring inhaler, pregabylin, trintellix, valtrex, tramadol prn, metoprolol succinate prn Diabetic medication -----n/a Patient Special Instructions ----- Pre-Op special Istructions ----- Patient verbalized understanding of instructions that were given at this phone interview. Patient denies shortness of breath, chest pain, fever, cough a this phone interview.

## 2019-04-06 ENCOUNTER — Other Ambulatory Visit (HOSPITAL_COMMUNITY)
Admission: RE | Admit: 2019-04-06 | Discharge: 2019-04-06 | Disposition: A | Discharge status: Home / Self Care | Payer: Medicare Other | Source: Ambulatory Visit | Attending: Orthopedic Surgery | Admitting: Orthopedic Surgery

## 2019-04-06 DIAGNOSIS — Z01812 Encounter for preprocedural laboratory examination: Secondary | ICD-10-CM | POA: Diagnosis not present

## 2019-04-06 DIAGNOSIS — Z20828 Contact with and (suspected) exposure to other viral communicable diseases: Secondary | ICD-10-CM | POA: Insufficient documentation

## 2019-04-06 LAB — SARS CORONAVIRUS 2 (TAT 6-24 HRS): SARS Coronavirus 2: NEGATIVE

## 2019-04-09 ENCOUNTER — Ambulatory Visit (HOSPITAL_BASED_OUTPATIENT_CLINIC_OR_DEPARTMENT_OTHER): Payer: Medicare Other | Admitting: Anesthesiology

## 2019-04-09 ENCOUNTER — Encounter (HOSPITAL_BASED_OUTPATIENT_CLINIC_OR_DEPARTMENT_OTHER)
Admission: RE | Disposition: A | Discharge status: Home / Self Care | Payer: Self-pay | Source: Home / Self Care | Attending: Orthopedic Surgery

## 2019-04-09 ENCOUNTER — Ambulatory Visit (HOSPITAL_BASED_OUTPATIENT_CLINIC_OR_DEPARTMENT_OTHER)
Admission: RE | Admit: 2019-04-09 | Discharge: 2019-04-09 | Disposition: A | Discharge status: Home / Self Care | Payer: Medicare Other | Attending: Orthopedic Surgery | Admitting: Orthopedic Surgery

## 2019-04-09 ENCOUNTER — Encounter (HOSPITAL_BASED_OUTPATIENT_CLINIC_OR_DEPARTMENT_OTHER): Payer: Self-pay

## 2019-04-09 DIAGNOSIS — G8929 Other chronic pain: Secondary | ICD-10-CM | POA: Diagnosis present

## 2019-04-09 DIAGNOSIS — S83231A Complex tear of medial meniscus, current injury, right knee, initial encounter: Secondary | ICD-10-CM | POA: Diagnosis not present

## 2019-04-09 DIAGNOSIS — X58XXXA Exposure to other specified factors, initial encounter: Secondary | ICD-10-CM | POA: Diagnosis not present

## 2019-04-09 DIAGNOSIS — M659 Synovitis and tenosynovitis, unspecified: Secondary | ICD-10-CM | POA: Insufficient documentation

## 2019-04-09 DIAGNOSIS — G43909 Migraine, unspecified, not intractable, without status migrainosus: Secondary | ICD-10-CM | POA: Insufficient documentation

## 2019-04-09 DIAGNOSIS — F17210 Nicotine dependence, cigarettes, uncomplicated: Secondary | ICD-10-CM | POA: Diagnosis not present

## 2019-04-09 DIAGNOSIS — S83241A Other tear of medial meniscus, current injury, right knee, initial encounter: Secondary | ICD-10-CM | POA: Diagnosis not present

## 2019-04-09 DIAGNOSIS — J45909 Unspecified asthma, uncomplicated: Secondary | ICD-10-CM | POA: Insufficient documentation

## 2019-04-09 DIAGNOSIS — I10 Essential (primary) hypertension: Secondary | ICD-10-CM | POA: Diagnosis not present

## 2019-04-09 DIAGNOSIS — M794 Hypertrophy of (infrapatellar) fat pad: Secondary | ICD-10-CM | POA: Diagnosis not present

## 2019-04-09 DIAGNOSIS — G4700 Insomnia, unspecified: Secondary | ICD-10-CM | POA: Insufficient documentation

## 2019-04-09 DIAGNOSIS — Z7951 Long term (current) use of inhaled steroids: Secondary | ICD-10-CM | POA: Insufficient documentation

## 2019-04-09 DIAGNOSIS — F419 Anxiety disorder, unspecified: Secondary | ICD-10-CM | POA: Diagnosis not present

## 2019-04-09 DIAGNOSIS — Z79899 Other long term (current) drug therapy: Secondary | ICD-10-CM | POA: Diagnosis not present

## 2019-04-09 DIAGNOSIS — M797 Fibromyalgia: Secondary | ICD-10-CM | POA: Diagnosis not present

## 2019-04-09 DIAGNOSIS — M65861 Other synovitis and tenosynovitis, right lower leg: Secondary | ICD-10-CM | POA: Diagnosis not present

## 2019-04-09 DIAGNOSIS — G8918 Other acute postprocedural pain: Secondary | ICD-10-CM | POA: Diagnosis not present

## 2019-04-09 HISTORY — DX: Cervicalgia: M54.2

## 2019-04-09 HISTORY — PX: KNEE ARTHROSCOPY WITH MEDIAL MENISECTOMY: SHX5651

## 2019-04-09 HISTORY — DX: Myalgia, other site: M79.18

## 2019-04-09 HISTORY — DX: Unspecified ovarian cyst, unspecified side: N83.209

## 2019-04-09 HISTORY — DX: Family history of other specified conditions: Z84.89

## 2019-04-09 SURGERY — ARTHROSCOPY, KNEE, WITH MEDIAL MENISCECTOMY
Anesthesia: Regional | Site: Knee | Laterality: Right

## 2019-04-09 MED ORDER — SCOPOLAMINE 1 MG/3DAYS TD PT72
1.0000 | MEDICATED_PATCH | TRANSDERMAL | Status: DC
  Administered 2019-04-09: 1.5 mg via TRANSDERMAL
  Filled 2019-04-09: qty 1

## 2019-04-09 MED ORDER — ACETAMINOPHEN 500 MG PO TABS
1000.0000 mg | ORAL_TABLET | Freq: Once | ORAL | Status: AC
  Administered 2019-04-09: 1000 mg via ORAL
  Filled 2019-04-09: qty 2

## 2019-04-09 MED ORDER — HYDROCODONE-ACETAMINOPHEN 7.5-325 MG PO TABS: 1.0000 | ORAL_TABLET | Freq: Four times a day (QID) | ORAL | 0 refills | Status: DC | PRN

## 2019-04-09 MED ORDER — FENTANYL CITRATE (PF) 100 MCG/2ML IJ SOLN
INTRAMUSCULAR | Status: AC
  Filled 2019-04-09: qty 2

## 2019-04-09 MED ORDER — ONDANSETRON 4 MG PO TBDP: 4.0000 mg | ORAL_TABLET | Freq: Three times a day (TID) | ORAL | 0 refills | Status: DC | PRN

## 2019-04-09 MED ORDER — FENTANYL CITRATE (PF) 100 MCG/2ML IJ SOLN
25.0000 ug | INTRAMUSCULAR | Status: DC | PRN
  Administered 2019-04-09 (×2): 25 ug via INTRAVENOUS
  Filled 2019-04-09: qty 1

## 2019-04-09 MED ORDER — LIDOCAINE 2% (20 MG/ML) 5 ML SYRINGE
INTRAMUSCULAR | Status: AC
  Filled 2019-04-09: qty 5

## 2019-04-09 MED ORDER — FENTANYL CITRATE (PF) 100 MCG/2ML IJ SOLN
INTRAMUSCULAR | Status: DC | PRN
  Administered 2019-04-09 (×2): 50 ug via INTRAVENOUS

## 2019-04-09 MED ORDER — CEFAZOLIN SODIUM-DEXTROSE 2-4 GM/100ML-% IV SOLN
2.0000 g | INTRAVENOUS | Status: AC
  Administered 2019-04-09: 13:00:00 2 g via INTRAVENOUS
  Filled 2019-04-09: qty 100

## 2019-04-09 MED ORDER — MIDAZOLAM HCL 2 MG/2ML IJ SOLN
INTRAMUSCULAR | Status: AC
  Filled 2019-04-09: qty 2

## 2019-04-09 MED ORDER — LIDOCAINE HCL (CARDIAC) PF 100 MG/5ML IV SOSY
PREFILLED_SYRINGE | INTRAVENOUS | Status: DC | PRN
  Administered 2019-04-09: 20 mg via INTRAVENOUS

## 2019-04-09 MED ORDER — PROPOFOL 10 MG/ML IV BOLUS
INTRAVENOUS | Status: DC | PRN
  Administered 2019-04-09: 150 mg via INTRAVENOUS
  Administered 2019-04-09: 50 mg via INTRAVENOUS

## 2019-04-09 MED ORDER — ONDANSETRON HCL 4 MG/2ML IJ SOLN
INTRAMUSCULAR | Status: DC | PRN
  Administered 2019-04-09: 4 mg via INTRAVENOUS

## 2019-04-09 MED ORDER — ACETAMINOPHEN 500 MG PO TABS
ORAL_TABLET | ORAL | Status: AC
  Filled 2019-04-09: qty 2

## 2019-04-09 MED ORDER — MIDAZOLAM HCL 2 MG/2ML IJ SOLN
2.0000 mg | Freq: Once | INTRAMUSCULAR | Status: AC
  Administered 2019-04-09: 2 mg via INTRAVENOUS
  Filled 2019-04-09: qty 2

## 2019-04-09 MED ORDER — MIDAZOLAM HCL 5 MG/5ML IJ SOLN
INTRAMUSCULAR | Status: DC | PRN
  Administered 2019-04-09: 2 mg via INTRAVENOUS

## 2019-04-09 MED ORDER — DEXAMETHASONE SODIUM PHOSPHATE 4 MG/ML IJ SOLN
INTRAMUSCULAR | Status: DC | PRN
  Administered 2019-04-09: 5 mg via INTRAVENOUS

## 2019-04-09 MED ORDER — ROPIVACAINE HCL 7.5 MG/ML IJ SOLN
INTRAMUSCULAR | Status: DC | PRN
  Administered 2019-04-09: 20 mL via PERINEURAL

## 2019-04-09 MED ORDER — SCOPOLAMINE 1 MG/3DAYS TD PT72
MEDICATED_PATCH | TRANSDERMAL | Status: AC
  Filled 2019-04-09: qty 1

## 2019-04-09 MED ORDER — ONDANSETRON HCL 4 MG/2ML IJ SOLN
INTRAMUSCULAR | Status: AC
  Filled 2019-04-09: qty 2

## 2019-04-09 MED ORDER — CHLORHEXIDINE GLUCONATE 4 % EX LIQD
60.0000 mL | Freq: Once | CUTANEOUS | Status: DC
  Filled 2019-04-09: qty 118

## 2019-04-09 MED ORDER — PROPOFOL 10 MG/ML IV BOLUS
INTRAVENOUS | Status: AC
  Filled 2019-04-09: qty 20

## 2019-04-09 MED ORDER — SODIUM CHLORIDE 0.9 % IR SOLN
Status: DC | PRN
  Administered 2019-04-09: 6000 mL

## 2019-04-09 MED ORDER — DEXAMETHASONE SODIUM PHOSPHATE 10 MG/ML IJ SOLN
INTRAMUSCULAR | Status: DC | PRN
  Administered 2019-04-09: 5 mg

## 2019-04-09 MED ORDER — FENTANYL CITRATE (PF) 100 MCG/2ML IJ SOLN
100.0000 ug | Freq: Once | INTRAMUSCULAR | Status: AC
  Administered 2019-04-09: 100 ug via INTRAVENOUS
  Filled 2019-04-09: qty 2

## 2019-04-09 MED ORDER — CEFAZOLIN SODIUM-DEXTROSE 2-4 GM/100ML-% IV SOLN
INTRAVENOUS | Status: AC
  Filled 2019-04-09: qty 100

## 2019-04-09 MED ORDER — LACTATED RINGERS IV SOLN
INTRAVENOUS | Status: DC
  Administered 2019-04-09 (×2): via INTRAVENOUS
  Filled 2019-04-09: qty 1000

## 2019-04-09 SURGICAL SUPPLY — 35 items
ABLATOR ASPIRATE 50D MULTI-PRT (SURGICAL WAND) IMPLANT
BANDAGE ELASTIC 6 VELCRO ST LF (GAUZE/BANDAGES/DRESSINGS) ×2 IMPLANT
BANDAGE ESMARK 6X9 LF (GAUZE/BANDAGES/DRESSINGS) IMPLANT
BLADE SHAVER TORPEDO 4X13 (MISCELLANEOUS) ×2 IMPLANT
BNDG CMPR 9X6 STRL LF SNTH (GAUZE/BANDAGES/DRESSINGS)
BNDG ELASTIC 6X5.8 VLCR STR LF (GAUZE/BANDAGES/DRESSINGS) ×2 IMPLANT
BNDG ESMARK 6X9 LF (GAUZE/BANDAGES/DRESSINGS)
COVER WAND RF STERILE (DRAPES) ×2 IMPLANT
CUFF TOURN SGL QUICK 34 (TOURNIQUET CUFF) ×2
CUFF TRNQT CYL 34X4.125X (TOURNIQUET CUFF) ×1 IMPLANT
DRAPE ARTHROSCOPY W/POUCH 114 (DRAPES) ×2 IMPLANT
DRAPE U-SHAPE 47X51 STRL (DRAPES) ×2 IMPLANT
DRSG PAD ABDOMINAL 8X10 ST (GAUZE/BANDAGES/DRESSINGS) ×2 IMPLANT
DURAPREP 26ML APPLICATOR (WOUND CARE) ×2 IMPLANT
GAUZE SPONGE 4X4 12PLY STRL (GAUZE/BANDAGES/DRESSINGS) ×2 IMPLANT
GAUZE XEROFORM 1X8 LF (GAUZE/BANDAGES/DRESSINGS) ×2 IMPLANT
GLOVE BIO SURGEON STRL SZ7.5 (GLOVE) ×2 IMPLANT
GLOVE BIOGEL PI IND STRL 8 (GLOVE) ×1 IMPLANT
GLOVE BIOGEL PI INDICATOR 8 (GLOVE) ×1
GOWN STRL REUS W/TWL XL LVL3 (GOWN DISPOSABLE) IMPLANT
IV NS IRRIG 3000ML ARTHROMATIC (IV SOLUTION) ×4 IMPLANT
KIT TURNOVER CYSTO (KITS) ×2 IMPLANT
KNEE WRAP E Z 3 GEL PACK (MISCELLANEOUS) ×2 IMPLANT
MANIFOLD NEPTUNE II (INSTRUMENTS) ×2 IMPLANT
PACK ARTHROSCOPY DSU (CUSTOM PROCEDURE TRAY) ×2 IMPLANT
PACK BASIN DAY SURGERY FS (CUSTOM PROCEDURE TRAY) ×2 IMPLANT
PAD ARMBOARD 7.5X6 YLW CONV (MISCELLANEOUS) IMPLANT
PADDING CAST COTTON 6X4 STRL (CAST SUPPLIES) ×2 IMPLANT
PORT APPOLLO RF 90DEGREE MULTI (SURGICAL WAND) IMPLANT
SUT ETHILON 4 0 PS 2 18 (SUTURE) ×2 IMPLANT
SYR CONTROL 10ML LL (SYRINGE) ×2 IMPLANT
TOWEL OR 17X26 10 PK STRL BLUE (TOWEL DISPOSABLE) ×2 IMPLANT
TUBE CONNECTING 12X1/4 (SUCTIONS) ×4 IMPLANT
TUBING ARTHROSCOPY IRRIG 16FT (MISCELLANEOUS) ×2 IMPLANT
WATER STERILE IRR 500ML POUR (IV SOLUTION) ×2 IMPLANT

## 2019-04-09 NOTE — Progress Notes (Signed)
Assisted Dr. Lanetta Inch with right, ultrasound guided, femoral block. Side rails up, monitors on throughout procedure. See vital signs in flow sheet. Tolerated Procedure well.

## 2019-04-09 NOTE — Anesthesia Procedure Notes (Signed)
Procedure Name: LMA Insertion Date/Time: 04/09/2019 1:03 PM Performed by: Justice Rocher, CRNA Pre-anesthesia Checklist: Patient identified, Emergency Drugs available, Suction available and Patient being monitored Patient Re-evaluated:Patient Re-evaluated prior to induction Oxygen Delivery Method: Circle system utilized Preoxygenation: Pre-oxygenation with 100% oxygen Induction Type: IV induction Ventilation: Mask ventilation without difficulty LMA: LMA inserted LMA Size: 4.0 Number of attempts: 1 Airway Equipment and Method: Bite block Placement Confirmation: positive ETCO2 and breath sounds checked- equal and bilateral Tube secured with: Tape Dental Injury: Teeth and Oropharynx as per pre-operative assessment

## 2019-04-09 NOTE — Op Note (Signed)
Surgery Date: 04/09/2019  Surgeon(s): Nicholes Stairs, MD  ANESTHESIA:  general, with regional  FLUIDS: Per anesthesia record.   ESTIMATED BLOOD LOSS: minimal  PREOPERATIVE DIAGNOSES:  1.  Right knee medial meniscus tear 2.  Right  knee synovitis, fat pad syndrome  POSTOPERATIVE DIAGNOSES:  same  PROCEDURES PERFORMED:  1.  Right knee arthroscopy with limited synovectomy 2. knee arthroscopy with arthroscopic partial medial meniscectomy   DESCRIPTION OF PROCEDURE: Ms. [name] is a 43 y.o.-year-old female with right knee medial meniscus tear.  She has failed conservative treatment and has had multiple injections as well as recalcitrant symptoms.  Plans are to proceed with partial medial meniscectomy and fat pad resection, and diagnostic arthroscopy with debridement as indicated. Full discussion held regarding risks benefits alternatives and complications related surgical intervention. Conservative care options reviewed. All questions answered.  The patient was identified in the preoperative holding area and the operative extremity was marked. The patient was brought to the operating room and transferred to operating table in a supine position. Satisfactory general anesthesia was induced by anesthesiology.    Standard anterolateral, anteromedial arthroscopy portals were obtained. The anteromedial portal was obtained with a spinal needle for localization under direct visualization with subsequent diagnostic findings.   Anteromedial and anterolateral chambers: moderate synovitis. The synovitis was debrided with a 4.5 mm full radius shaver through both the anteromedial and lateral portals.  We then performed a formal limited synovectomy of the anterior compartment.  Utilizing the rotary shaver from both medial and lateral parapatellar portals the fat pad portion adjacent to the inferior pole of the patella was completely cleared from patella tendon to synovial  surface.  Suprapatellar pouch and gutters: mild synovitis or debris. Patella chondral surface: Grade 0 Trochlear chondral surface: Grade 0 Patellofemoral tracking: Midline Medial meniscus: Complex tear at the junction of the mid body and posterior horn.  This was mostly a horizontal tear through the white zone to the red-white zone.  Medial femoral condyle flexion bearing surface: Grade 1 Medial femoral condyle extension bearing surface: Grade 1 Medial tibial plateau: Grade 0 Anterior cruciate ligament:stable Posterior cruciate ligament:stable Lateral meniscus: Intact without tears.   Lateral femoral condyle flexion bearing surface: Grade 0 Lateral femoral condyle extension bearing surface: Grade 0 Lateral tibial plateau: Grade 0  Partial medial meniscectomy was carried out with combination of basket forceps as well as rotary motorized shaver.  All unstable edges were trimmed back to stable surface.  All loose meniscal fragments were evacuated from the joint.  After completion of synovectomy, diagnostic exam, and debridements as described, all compartments were checked and no residual debris remained. Hemostasis was achieved with the cautery wand. The portals were approximated with buried monocryl. All excess fluid was expressed from the joint.  Xeroform sterile gauze dressings were applied followed by Ace bandage and ice pack.   DISPOSITION: The patient was awakened from general anesthetic, extubated, taken to the recovery room in medically stable condition, no apparent complications. The patient may be weightbearing as tolerated to the operative lower extremity.  Range of motion of right knee as tolerated.

## 2019-04-09 NOTE — Discharge Instructions (Signed)
Regional Anesthesia Blocks  1. Numbness or the inability to move the "blocked" extremity may last from 3-48 hours after placement. The length of time depends on the medication injected and your individual response to the medication. If the numbness is not going away after 48 hours, call your surgeon.  2. The extremity that is blocked will need to be protected until the numbness is gone and the  Strength has returned. Because you cannot feel it, you will need to take extra care to avoid injury. Because it may be weak, you may have difficulty moving it or using it. You may not know what position it is in without looking at it while the block is in effect.  3. For blocks in the legs and feet, returning to weight bearing and walking needs to be done carefully. You will need to wait until the numbness is entirely gone and the strength has returned. You should be able to move your leg and foot normally before you try and bear weight or walk. You will need someone to be with you when you first try to ensure you do not fall and possibly risk injury.  4. Bruising and tenderness at the needle site are common side effects and will resolve in a few days.  5. Persistent numbness or new problems with movement should be communicated to the surgeon or the [place] [phone] [place] [phone]).-Maintain postoperative bandage for 3 days.  You may remove this on the third day and begin showering at that time.  Please do not submerge underwater.  -Okay for full weightbearing as tolerated to the right lower extremity.  -Apply ice to the right knee for 30 minutes/h that you are awake and able.  -For mild to moderate pain use Tylenol and/or ibuprofen around-the-clock.  For breakthrough pain use Norco as directed.  -For the prevention of blood clots take an 81 mg aspirin once per day for the next 6 weeks.  -Return to see Dr. Stann Mainland in 2 weeks for routine postop check.   Post  Anesthesia Home Care Instructions  Activity: Get plenty of rest for the remainder of the day. A responsible individual must stay with you for 24 hours following the procedure.  For the next 24 hours, DO NOT: -Drive a car -Paediatric nurse -Drink alcoholic beverages -Take any medication unless instructed by your physician -Make any legal decisions or sign important papers.  Meals: Start with liquid foods such as gelatin or soup. Progress to regular foods as tolerated. Avoid greasy, spicy, heavy foods. If nausea and/or vomiting occur, drink only clear liquids until the nausea and/or vomiting subsides. Call your physician if vomiting continues.  Special Instructions/Symptoms: Your throat may feel dry or sore from the anesthesia or the breathing tube placed in your throat during surgery. If this causes discomfort, gargle with warm salt water. The discomfort should disappear within 24 hours.  If you had a scopolamine patch placed behind your ear for the management of post- operative nausea and/or vomiting:  1. The medication in the patch is effective for 72 hours, after which it should be removed.  Wrap patch in a tissue and discard in the trash. Wash hands thoroughly with soap and water. 2. You may remove the patch earlier than 72 hours if you experience unpleasant side effects which may include dry mouth, dizziness or visual disturbances. 3. Avoid touching the patch. Wash your hands with soap and water after contact with the patch.

## 2019-04-09 NOTE — Anesthesia Procedure Notes (Signed)
Anesthesia Regional Block: Femoral nerve block   Pre-Anesthetic Checklist: ,, timeout performed, Correct Patient, Correct Site, Correct Laterality, Correct Procedure, Correct Position, site marked, Risks and benefits discussed,  Surgical consent,  Pre-op evaluation,  At surgeon's request and post-op pain management  Laterality: Right  Prep: Maximum Sterile Barrier Precautions used, chloraprep       Needles:  Injection technique: Single-shot  Needle Type: Echogenic Stimulator Needle     Needle Length: 9cm  Needle Gauge: 22     Additional Needles:   Procedures:,,,, ultrasound used (permanent image in chart),,,,  Narrative:  Start time: 04/09/2019 11:44 AM End time: 04/09/2019 11:54 AM Injection made incrementally with aspirations every 5 mL.  Performed by: Personally  Anesthesiologist: Freddrick March, MD  Additional Notes: Monitors applied. No increased pain on injection. No increased resistance to injection. Injection made in 5cc increments. Good needle visualization. Patient tolerated procedure well.

## 2019-04-09 NOTE — H&P (Signed)
ORTHOPAEDIC h and p  REQUESTING PHYSICIAN: Nicholes Stairs, MD  PCP:  Donella Stade, PA-C  Chief Complaint: Chronic right knee pain  HPI: [name] is a 43 y.o. female who complains of recalcitrant right knee pain as well as mechanical symptoms for the last multiple months.  She has had reasonable conservative treatment to this point including injections, oral medications, activity modification, and physical therapy.  She is here today for right knee arthroscopy.  No new complaints.  Past Medical History:  Diagnosis Date  . Anxiety   . Asthma   . Complication of anesthesia    likes iv med and scopolamine patch also, limited neck motion  . Dysrhythmia    tachycardia  . Elevated heart rate and blood pressure    takes toprol for increased heart rate  . Family history of adverse reaction to anesthesia    mother got blood clots after anesthesia , mother on heparin now  . Fibromyalgia   . Headache(784.0)    migraines  . IC (interstitial cystitis)   . Insomnia   . Myofascial pain syndrome, cervical   . Neck pain    C 2 and C 3 slipped disc has neck pain with, limited neck motion  . Ovarian cyst    x 2now  . PONV (postoperative nausea and vomiting)    Past Surgical History:  Procedure Laterality Date  . CARPAL TUNNEL RELEASE Left 06/05/2014   Procedure: LEFT CARPAL TUNNEL RELEASE;  Surgeon: Daryll Brod, MD;  Location: [facility];  Service: Orthopedics;  Laterality: Left;  . CARPAL TUNNEL RELEASE Right 08/25/2014   Procedure: RIGHT CARPAL TUNNEL RELEASE;  Surgeon: Daryll Brod, MD;  Location: [facility];  Service: Orthopedics;  Laterality: Right;  . CARPAL TUNNEL RELEASE Left 07/14/2015   Procedure: LEFT CARPAL TUNNEL RELEASE;  Surgeon: Daryll Brod, MD;  Location: [facility];  Service: Orthopedics;  Laterality: Left;  . CERVICAL SPINE SURGERY  11/2013   dr Joya Salm  . CESAREAN SECTION     x2  . DILATION AND  EVACUATION N/A 05/07/2013   Procedure: DILATATION AND EVACUATION with tissue sent for chromosome analysis;  Surgeon: Lovenia Kim, MD;  Location: [facility];  Service: Gynecology;  Laterality: N/A;  . EPIDURAL STEROID INJECTION  03/20/15  . OVARIAN CYST REMOVAL    . PELVIC LAPAROSCOPY  2003   endometriosis  . POSTERIOR CERVICAL FUSION/FORAMINOTOMY N/A 10/28/2014   Procedure: Cervical Four-Cervical Seven Posterior cervical fusion with lateral mass fixation;  Surgeon: Eustace Moore, MD;  Location: [facility];  Service: Neurosurgery;  Laterality: N/A;  posterior  . ROBOTIC ASSISTED LAPAROSCOPIC LYSIS OF ADHESION N/A 07/18/2014   Procedure: ROBOTIC ASSISTED LAPAROSCOPIC  EXCISION POSTERIOR  UTERINE WALL MASS; EXCISION RIGHT MESSALEINGEAL MASS, EXCISION AND ABLATION CULDASAC ENDOMETRIOSIS;  Surgeon: Brien Few, MD;  Location: [facility];  Service: Gynecology;  Laterality: N/A;  . ROBOTIC ASSISTED TOTAL HYSTERECTOMY WITH SALPINGECTOMY Bilateral 05/11/2015   Procedure: ROBOTIC ASSISTED TOTAL HYSTERECTOMY WITH BILATERAL SALPINGECTOMY;  Surgeon: Brien Few, MD;  Location: [facility];  Service: Gynecology;  Laterality: Bilateral;   Social History   Socioeconomic History  . Marital status: Divorced    Spouse name: Not on file  . Number of children: 2  . Years of education: Not on file  . Highest education level: Not on file  Occupational History  . Occupation: CMA    Employer: [employer]    Comment: [place]  . Financial  resource strain: Not on file  . Food insecurity    Worry: Not on file    Inability: Not on file  . Transportation needs    Medical: Not on file    Non-medical: Not on file  Tobacco Use  . Smoking status: Current Some Day Smoker    Packs/day: 0.50    Years: 18.00    Pack years: 9.00    Types: Cigarettes  . Smokeless tobacco: Never Used  Substance and Sexual Activity  . Alcohol use: No    Alcohol/week: 0.0 standard drinks  . Drug use: No  . Sexual  activity: Yes    Birth control/protection: Surgical  Lifestyle  . Physical activity    Days per week: Not on file    Minutes per session: Not on file  . Stress: Not on file  Relationships  . Social Herbalist on phone: Not on file    Gets together: Not on file    Attends religious service: Not on file    Active member of club or organization: Not on file    Attends meetings of clubs or organizations: Not on file    Relationship status: Not on file  Other Topics Concern  . Not on file  Social History Narrative   Going through separation.  Children Sage and [place]   Family History  Problem Relation Age of Onset  . Heart attack Father   . ADD / ADHD Brother   . Depression Brother   . Depression Cousin    Allergies  Allergen Reactions  . Gadolinium Derivatives Hives, Itching and Other (See Comments)    After MRI with Multihance finished, patient had redness and itching on chest, tightness in throat.  Patient went to ED to be monitored (04/23/14).had mri since takes benadryl before, tolerates well  . Prednisone Other (See Comments)    Pain all over, "it triggers my myofascial pain syndrome"   . Amitriptyline Other (See Comments)    Gained weight and does not want to be on.   . Eggs Or Egg-Derived Products Rash  . Hydrocodone-Acetaminophen Itching    Tolerates with benadryl (no reaction to oxycodone)  . Lunesta [Eszopiclone] Other (See Comments)    Metallic taste in mouth  . Tramadol Itching   Prior to Admission medications   Medication Sig Start Date End Date Taking? Authorizing Provider  hydrochlorothiazide (HYDRODIURIL) 12.5 MG tablet TAKE 1 TABLET (12.5 MG TOTAL) BY MOUTH DAILY AS NEEDED. APPT FOR FURTHER REFILLS 03/18/19  Yes Breeback, Jade L, PA-C  hydrOXYzine (ATARAX/VISTARIL) 50 MG tablet TAKE 1 TABLET (50 MG TOTAL) BY MOUTH 3 (THREE) TIMES DAILY AS NEEDED. 01/01/19  Yes Breeback, Jade L, PA-C  metoprolol succinate (TOPROL-XL) 100 MG 24 hr tablet Take 100 mg by  mouth as needed. Take with or immediately following a meal.   Yes [provider]  pregabalin (LYRICA) 200 MG capsule Take 200 mg by mouth 3 (three) times daily.   Yes [provider]  promethazine (PHENERGAN) 25 MG tablet TAKE 1 TABLET (25 MG TOTAL) BY MOUTH EVERY 8 (EIGHT) HOURS AS NEEDED FOR NAUSEA OR VOMITING. 02/05/19  Yes Gregor Hams, MD  traMADol (ULTRAM) 50 MG tablet Take 1 tablet (50 mg total) by mouth every 8 (eight) hours as needed for moderate pain. Maximum 6 tabs per day. 03/19/19  Yes Silverio Decamp, MD  TRINTELLIX 5 MG TABS tablet TAKE 1 TABLET BY MOUTH EVERY DAY 01/31/19  Yes Breeback, Jade L, PA-C  valACYclovir (VALTREX) 1000 MG tablet TAKE 1 TABLET BY MOUTH EVERY DAY 03/18/19  Yes Breeback, Jade L, PA-C  zolpidem (AMBIEN) 10 MG tablet TAKE 1 TABLET (10 MG TOTAL) BY MOUTH AT BEDTIME. PLEASE REFILL EARLY DUE TO PATIENT GOING ON VACATION. 04/01/19  Yes Breeback, Jade L, PA-C  BREO ELLIPTA 100-25 MCG/INH AEPB TAKE 1 PUFF BY MOUTH EVERY DAY Patient taking differently: as needed.  12/06/18   Breeback, Jade L, PA-C  rizatriptan (MAXALT) 10 MG tablet TAKE 1 TABLET PO PRN FOR MIGRAINE, MAY REPEAT 2 HRS PRN APPT FOR FURTHER REFILLS 04/01/19   Breeback, Jade L, PA-C   No results found.  Positive ROS: All other systems have been reviewed and were otherwise negative with the exception of those mentioned in the HPI and as above.  Physical Exam: General: Alert, no acute distress Cardiovascular: No pedal edema Respiratory: No cyanosis, no use of accessory musculature GI: No organomegaly, abdomen is soft and non-tender Skin: No lesions in the area of chief complaint Neurologic: Sensation intact distally Psychiatric: Patient is competent for consent with normal mood and affect Lymphatic: No axillary or cervical lymphadenopathy  MUSCULOSKELETAL:  Right knee:  Skin intact no lesions.  Neurovascular intact distal.  Assessment: 1.  Fat pad impingement syndrome right  knee  2.  Medial meniscus tear right knee, initial encounter.  Plan: -Plans for arthroscopic intervention of the right knee today.  We again discussed the treatment plan and the indications for the procedure at length.  Questions were solicited and answered to her satisfaction.  -We will plan for discharge home postoperatively from PACU.  She will be weightbearing as tolerated.  She will follow up in 2 weeks with routine postop care.    Nicholes Stairs, MD Cell [phone]    04/09/2019 12:26 PM

## 2019-04-09 NOTE — Brief Op Note (Signed)
04/09/2019  1:43 PM  PATIENT:  [name]  43 y.o. female  PRE-OPERATIVE DIAGNOSIS:  Right medial meniscus tear, fat pad syndrome  POST-OPERATIVE DIAGNOSIS:  Right medial meniscus tear, fat pad syndrome  PROCEDURE:  Procedure(s) with comments: Right knee arthroscopy fat pad resection with partial medial meniscectomy (Right) - 60 mins  SURGEON:  Surgeon(s) and Role:    * Nicholes Stairs, MD - Primary  PHYSICIAN ASSISTANT:   ASSISTANTS: none   ANESTHESIA:   regional and general  EBL:  2 mL   BLOOD ADMINISTERED:none  DRAINS: none   LOCAL MEDICATIONS USED:  NONE  SPECIMEN:  No Specimen  DISPOSITION OF SPECIMEN:  N/A  COUNTS:  YES  TOURNIQUET:  * Missing tourniquet times found for documented tourniquets in log: [id number] *  DICTATION: .Note written in EPIC  PLAN OF CARE: Discharge to home after PACU  PATIENT DISPOSITION:  PACU - hemodynamically stable.   Delay start of Pharmacological VTE agent (>24hrs) due to surgical blood loss or risk of bleeding: not applicable

## 2019-04-09 NOTE — Transfer of Care (Signed)
Immediate Anesthesia Transfer of Care Note  Patient: [name]  Procedure(s) Performed: Procedure(s) (LRB): Right knee arthroscopy fat pad resection with partial medial meniscectomy (Right)  Patient Location: PACU  Anesthesia Type: General  Level of Consciousness: awake, sedated, patient cooperative and responds to stimulation  Airway & Oxygen Therapy: Patient Spontanous Breathing and Patient connected to [place] 02 and soft FM   Post-op Assessment: Report given to PACU RN, Post -op Vital signs reviewed and stable and Patient moving all extremities  Post vital signs: Reviewed and stable  Complications: No apparent anesthesia complications

## 2019-04-09 NOTE — Anesthesia Preprocedure Evaluation (Addendum)
Anesthesia Evaluation  Patient identified by MRN, date of birth, ID band Patient awake    Reviewed: Allergy & Precautions, NPO status , Patient's Chart, lab work & pertinent test results, reviewed documented beta blocker date and time   History of Anesthesia Complications (+) PONV and history of anesthetic complications  Airway Mallampati: I  TM Distance: >3 FB Neck ROM: Full    Dental  (+) Edentulous Upper   Pulmonary asthma , Current SmokerPatient did not abstain from smoking.,    Pulmonary exam normal breath sounds clear to auscultation       Cardiovascular hypertension, Pt. on home beta blockers and Pt. on medications Normal cardiovascular exam Rhythm:Regular Rate:Normal     Neuro/Psych  Headaches, PSYCHIATRIC DISORDERS Anxiety Depression    GI/Hepatic negative GI ROS, Neg liver ROS,   Endo/Other  negative endocrine ROS  Renal/GU negative Renal ROS  negative genitourinary   Musculoskeletal  (+) Arthritis , Fibromyalgia -C2/C3 herniated disc   Abdominal   Peds  Hematology negative hematology ROS (+)   Anesthesia Other Findings   Reproductive/Obstetrics                            Anesthesia Physical Anesthesia Plan  ASA: II  Anesthesia Plan: General and Regional   Post-op Pain Management:  Regional for Post-op pain   Induction: Intravenous  PONV Risk Score and Plan: 3 and Ondansetron, Dexamethasone, Midazolam and Scopolamine patch - Pre-op  Airway Management Planned: LMA  Additional Equipment:   Intra-op Plan:   Post-operative Plan: Extubation in OR  Informed Consent: I have reviewed the patients History and Physical, chart, labs and discussed the procedure including the risks, benefits and alternatives for the proposed anesthesia with the patient or authorized representative who has indicated his/her understanding and acceptance.     Dental advisory given  Plan  Discussed with: CRNA  Anesthesia Plan Comments:         Anesthesia Quick Evaluation

## 2019-04-09 NOTE — Anesthesia Postprocedure Evaluation (Signed)
Anesthesia Post Note  Patient: SHAVANNA HEMMINGWAY  Procedure(s) Performed: Right knee arthroscopy fat pad resection with partial medial meniscectomy (Right Knee)     Patient location during evaluation: PACU Anesthesia Type: Regional and General Level of consciousness: awake and alert Pain management: pain level controlled Vital Signs Assessment: post-procedure vital signs reviewed and stable Respiratory status: spontaneous breathing, nonlabored ventilation, respiratory function stable and patient connected to nasal cannula oxygen Cardiovascular status: blood pressure returned to baseline and stable Postop Assessment: no apparent nausea or vomiting Anesthetic complications: no    Last Vitals:  Vitals:   04/09/19 1415 04/09/19 1430  BP: (!) 137/96 106/85  Pulse: (!) 105 95  Resp: 11 17  Temp:    SpO2: 94% 98%    Last Pain:  Vitals:   04/09/19 1055  TempSrc: Oral  PainSc: 7                  Tianni Escamilla L Mickayla Trouten

## 2019-04-10 ENCOUNTER — Encounter (HOSPITAL_BASED_OUTPATIENT_CLINIC_OR_DEPARTMENT_OTHER): Payer: Self-pay | Admitting: Orthopedic Surgery

## 2019-04-15 ENCOUNTER — Ambulatory Visit: Payer: Medicare Other | Admitting: Sports Medicine

## 2019-04-19 ENCOUNTER — Other Ambulatory Visit: Payer: Self-pay | Admitting: Physician Assistant

## 2019-04-19 DIAGNOSIS — F330 Major depressive disorder, recurrent, mild: Secondary | ICD-10-CM

## 2019-04-23 DIAGNOSIS — Z79899 Other long term (current) drug therapy: Secondary | ICD-10-CM | POA: Diagnosis not present

## 2019-04-23 DIAGNOSIS — Z91018 Allergy to other foods: Secondary | ICD-10-CM | POA: Diagnosis not present

## 2019-04-23 DIAGNOSIS — F17210 Nicotine dependence, cigarettes, uncomplicated: Secondary | ICD-10-CM | POA: Diagnosis not present

## 2019-04-23 DIAGNOSIS — J45909 Unspecified asthma, uncomplicated: Secondary | ICD-10-CM | POA: Diagnosis not present

## 2019-04-23 DIAGNOSIS — S91312A Laceration without foreign body, left foot, initial encounter: Secondary | ICD-10-CM | POA: Diagnosis not present

## 2019-04-23 DIAGNOSIS — G8911 Acute pain due to trauma: Secondary | ICD-10-CM | POA: Diagnosis not present

## 2019-04-23 DIAGNOSIS — Z791 Long term (current) use of non-steroidal anti-inflammatories (NSAID): Secondary | ICD-10-CM | POA: Diagnosis not present

## 2019-04-23 DIAGNOSIS — Z888 Allergy status to other drugs, medicaments and biological substances status: Secondary | ICD-10-CM | POA: Diagnosis not present

## 2019-04-23 DIAGNOSIS — Z91012 Allergy to eggs: Secondary | ICD-10-CM | POA: Diagnosis not present

## 2019-04-23 DIAGNOSIS — Z91041 Radiographic dye allergy status: Secondary | ICD-10-CM | POA: Diagnosis not present

## 2019-04-24 ENCOUNTER — Other Ambulatory Visit: Payer: Self-pay | Admitting: Physician Assistant

## 2019-04-24 DIAGNOSIS — G43009 Migraine without aura, not intractable, without status migrainosus: Secondary | ICD-10-CM

## 2019-04-24 DIAGNOSIS — M222X1 Patellofemoral disorders, right knee: Secondary | ICD-10-CM | POA: Diagnosis not present

## 2019-04-30 ENCOUNTER — Other Ambulatory Visit: Payer: Self-pay | Admitting: Physician Assistant

## 2019-04-30 ENCOUNTER — Other Ambulatory Visit: Payer: Self-pay | Admitting: Family Medicine

## 2019-04-30 DIAGNOSIS — T887XXA Unspecified adverse effect of drug or medicament, initial encounter: Secondary | ICD-10-CM

## 2019-05-01 ENCOUNTER — Ambulatory Visit (INDEPENDENT_AMBULATORY_CARE_PROVIDER_SITE_OTHER): Payer: Medicare Other | Admitting: Physician Assistant

## 2019-05-01 DIAGNOSIS — Z5329 Procedure and treatment not carried out because of patient's decision for other reasons: Secondary | ICD-10-CM

## 2019-05-01 NOTE — Progress Notes (Signed)
No show

## 2019-05-05 ENCOUNTER — Encounter: Payer: Self-pay | Admitting: Physician Assistant

## 2019-05-10 ENCOUNTER — Other Ambulatory Visit: Payer: Self-pay | Admitting: Physician Assistant

## 2019-05-10 DIAGNOSIS — G43009 Migraine without aura, not intractable, without status migrainosus: Secondary | ICD-10-CM

## 2019-05-11 ENCOUNTER — Other Ambulatory Visit: Payer: Self-pay | Admitting: Family Medicine

## 2019-05-11 DIAGNOSIS — F411 Generalized anxiety disorder: Secondary | ICD-10-CM

## 2019-05-11 DIAGNOSIS — F430 Acute stress reaction: Secondary | ICD-10-CM

## 2019-05-16 ENCOUNTER — Other Ambulatory Visit: Payer: Self-pay | Admitting: Family Medicine

## 2019-05-16 DIAGNOSIS — F411 Generalized anxiety disorder: Secondary | ICD-10-CM

## 2019-05-16 NOTE — Telephone Encounter (Signed)
I sent msg before. This says it was removed on 12/4. Can we call patient and see if taken off. I know she might be seeing a pain clinic that wanted her off medication.

## 2019-05-16 NOTE — Telephone Encounter (Signed)
Spoke with patient. She states she never stopped medication so unsure why it was taken off her list. She states she takes one tablet 1-2 times a week as needed.  She is currently on hydrocodone due to recent knee surgery but wasn't taken off clonazepam. Please advise.

## 2019-05-27 DIAGNOSIS — Z4789 Encounter for other orthopedic aftercare: Secondary | ICD-10-CM | POA: Diagnosis not present

## 2019-05-27 DIAGNOSIS — M25561 Pain in right knee: Secondary | ICD-10-CM | POA: Diagnosis not present

## 2019-05-29 ENCOUNTER — Other Ambulatory Visit: Payer: Self-pay | Admitting: Physician Assistant

## 2019-05-29 DIAGNOSIS — F411 Generalized anxiety disorder: Secondary | ICD-10-CM

## 2019-05-31 ENCOUNTER — Other Ambulatory Visit: Payer: Self-pay | Admitting: Physician Assistant

## 2019-05-31 DIAGNOSIS — F5101 Primary insomnia: Secondary | ICD-10-CM

## 2019-05-31 NOTE — Telephone Encounter (Signed)
Last refilled 04/01/2019 #30 with one refill.

## 2019-06-10 ENCOUNTER — Other Ambulatory Visit: Payer: Self-pay | Admitting: Physician Assistant

## 2019-06-10 DIAGNOSIS — I10 Essential (primary) hypertension: Secondary | ICD-10-CM

## 2019-06-10 DIAGNOSIS — G43009 Migraine without aura, not intractable, without status migrainosus: Secondary | ICD-10-CM

## 2019-06-10 NOTE — Progress Notes (Deleted)
Subjective:   [name] is a 44 y.o. female who presents for an Initial Medicare Annual Wellness Visit.  Review of Systems    No ROS.  Medicare Wellness Virtual Visit.  Visual/audio telehealth visit, UTA vital signs.   See social history for additional risk factors.       Sleep patterns:  Home Safety/Smoke Alarms: Feels safe in home. Smoke alarms in place.  Living environment;  Seat Belt Safety/Bike Helmet: Wears seat belt.   Female:   Pap-       Mammo-       Dexa scan- Not eligible due to age       53- Not eligible due to age     Objective:    There were no vitals filed for this visit. There is no height or weight on file to calculate BMI.  Advanced Directives 04/09/2019  Does Patient Have a Medical Advance Directive? No  Would patient like information on creating a medical advance directive? Yes (MAU/Ambulatory/Procedural Areas - Information given)  Some encounter information is confidential and restricted. Go to Review Flowsheets activity to see all data.    Current Medications (verified) Outpatient Encounter Medications as of 06/18/2019  Medication Sig  . BREO ELLIPTA 100-25 MCG/INH AEPB TAKE 1 PUFF BY MOUTH EVERY DAY (Patient taking differently: as needed. )  . clonazePAM (KLONOPIN) 0.5 MG tablet TAKE ONE TABLET AS NEEDED FOR ANXIETY NO MORE THAN ONCE A DAY.  . hydrochlorothiazide (HYDRODIURIL) 12.5 MG tablet TAKE 1 TABLET (12.5 MG TOTAL) BY MOUTH DAILY AS NEEDED. APPT FOR FURTHER REFILLS  . HYDROcodone-acetaminophen (NORCO) 7.5-325 MG tablet Take 1 tablet by mouth every 6 (six) hours as needed for moderate pain.  . hydrOXYzine (ATARAX/VISTARIL) 50 MG tablet TAKE 1 TABLET (50 MG TOTAL) BY MOUTH 3 (THREE) TIMES DAILY AS NEEDED.  . metoprolol succinate (TOPROL-XL) 100 MG 24 hr tablet Take 1 tablet (100 mg total) by mouth daily. NEEDS LABS  . ondansetron (ZOFRAN ODT) 4 MG disintegrating tablet Take 1 tablet (4 mg total) by mouth every 8 (eight) hours as  needed for nausea or vomiting.  . pregabalin (LYRICA) 200 MG capsule Take 200 mg by mouth 3 (three) times daily.  . promethazine (PHENERGAN) 25 MG tablet TAKE 1 TABLET (25 MG TOTAL) BY MOUTH EVERY 8 (EIGHT) HOURS AS NEEDED FOR NAUSEA OR VOMITING.  . rizatriptan (MAXALT) 10 MG tablet TAKE 1 TABLET AS NEEDED FOR MIGRAINE, MAY REPEAT 2 HRS PRN APPT FOR FURTHER REFILLS  . traMADol (ULTRAM) 50 MG tablet Take 1 tablet (50 mg total) by mouth every 8 (eight) hours as needed for moderate pain. Maximum 6 tabs per day.  . TRINTELLIX 5 MG TABS tablet TAKE 1 TABLET BY MOUTH EVERY DAY  . valACYclovir (VALTREX) 1000 MG tablet TAKE 1 TABLET BY MOUTH EVERY DAY  . zolpidem (AMBIEN) 10 MG tablet Take 1 tablet (10 mg total) by mouth at bedtime.   No facility-administered encounter medications on file as of 06/18/2019.    Allergies (verified) Gadolinium derivatives, Prednisone, Amitriptyline, Eggs or egg-derived products, Hydrocodone-acetaminophen, Lunesta [eszopiclone], and Tramadol   History: Past Medical History:  Diagnosis Date  . Anxiety   . Asthma   . Complication of anesthesia    likes iv med and scopolamine patch also, limited neck motion  . Dysrhythmia    tachycardia  . Elevated heart rate and blood pressure    takes toprol for increased heart rate  . Family history of adverse reaction to anesthesia  mother got blood clots after anesthesia , mother on heparin now  . Fibromyalgia   . Headache(784.0)    migraines  . IC (interstitial cystitis)   . Insomnia   . Myofascial pain syndrome, cervical   . Neck pain    C 2 and C 3 slipped disc has neck pain with, limited neck motion  . Ovarian cyst    x 2now  . PONV (postoperative nausea and vomiting)    Past Surgical History:  Procedure Laterality Date  . CARPAL TUNNEL RELEASE Left 06/05/2014   Procedure: LEFT CARPAL TUNNEL RELEASE;  Surgeon: Daryll Brod, MD;  Location: [place];  Service: Orthopedics;  Laterality: Left;  .  CARPAL TUNNEL RELEASE Right 08/25/2014   Procedure: RIGHT CARPAL TUNNEL RELEASE;  Surgeon: Daryll Brod, MD;  Location: [place];  Service: Orthopedics;  Laterality: Right;  . CARPAL TUNNEL RELEASE Left 07/14/2015   Procedure: LEFT CARPAL TUNNEL RELEASE;  Surgeon: Daryll Brod, MD;  Location: [place] [facility];  Service: Orthopedics;  Laterality: Left;  . CERVICAL SPINE SURGERY  11/2013   dr Joya Salm  . CESAREAN SECTION     x2  . DILATION AND EVACUATION N/A 05/07/2013   Procedure: DILATATION AND EVACUATION with tissue sent for chromosome analysis;  Surgeon: Lovenia Kim, MD;  Location: [facility];  Service: Gynecology;  Laterality: N/A;  . EPIDURAL STEROID INJECTION  03/20/15  . KNEE ARTHROSCOPY WITH MEDIAL MENISECTOMY Right 04/09/2019   Procedure: Right knee arthroscopy fat pad resection with partial medial meniscectomy;  Surgeon: Nicholes Stairs, MD;  Location: [facility];  Service: Orthopedics;  Laterality: Right;  60 mins  . OVARIAN CYST REMOVAL    . PELVIC LAPAROSCOPY  2003   endometriosis  . POSTERIOR CERVICAL FUSION/FORAMINOTOMY N/A 10/28/2014   Procedure: Cervical Four-Cervical Seven Posterior cervical fusion with lateral mass fixation;  Surgeon: Eustace Moore, MD;  Location: [facility];  Service: Neurosurgery;  Laterality: N/A;  posterior  . ROBOTIC ASSISTED LAPAROSCOPIC LYSIS OF ADHESION N/A 07/18/2014   Procedure: ROBOTIC ASSISTED LAPAROSCOPIC  EXCISION POSTERIOR  UTERINE WALL MASS; EXCISION RIGHT MESSALEINGEAL MASS, EXCISION AND ABLATION CULDASAC ENDOMETRIOSIS;  Surgeon: Brien Few, MD;  Location: [facility];  Service: Gynecology;  Laterality: N/A;  . ROBOTIC ASSISTED TOTAL HYSTERECTOMY WITH SALPINGECTOMY Bilateral 05/11/2015   Procedure: ROBOTIC ASSISTED TOTAL HYSTERECTOMY WITH BILATERAL SALPINGECTOMY;  Surgeon: Brien Few, MD;  Location: [facility];  Service: Gynecology;  Laterality: Bilateral;   Family History  Problem Relation Age of Onset  .  Heart attack Father   . ADD / ADHD Brother   . Depression Brother   . Depression Cousin    Social History   Socioeconomic History  . Marital status: Divorced    Spouse name: Not on file  . Number of children: 2  . Years of education: Not on file  . Highest education level: Not on file  Occupational History  . 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 Smoking status: Current Some Day Smoker    Packs/day: 0.50    Years: 18.00    Pack years: 9.00    Types: Cigarettes  . Smokeless tobacco: Never Used  Substance and Sexual Activity  . Alcohol use: No    Alcohol/week: 0.0 standard drinks  . Drug use: No  . Sexual activity: Yes    Birth control/protection: Surgical  Other Topics Concern  . Not on file  Social History  Narrative   Going through separation.  Children Sage and Luisa Hart   Social Determinants of Health   Financial Resource Strain:   . Difficulty of Paying Living Expenses: Not on file  Food Insecurity:   . Worried About Charity fundraiser in the Last Year: Not on file  . Ran Out of Food in the Last Year: Not on file  Transportation Needs:   . Lack of Transportation (Medical): Not on file  . Lack of Transportation (Non-Medical): Not on file  Physical Activity:   . Days of Exercise per Week: Not on file  . Minutes of Exercise per Session: Not on file  Stress:   . Feeling of Stress : Not on file  Social Connections:   . Frequency of Communication with Friends and Family: Not on file  . Frequency of Social Gatherings with Friends and Family: Not on file  . Attends Religious Services: Not on file  . Active Member of Clubs or Organizations: Not on file  . Attends Archivist Meetings: Not on file  . Marital Status: Not on file    Tobacco Counseling Ready to quit: Not Answered Counseling given: Not Answered   Clinical Intake:                        Activities of Daily Living In your  present state of health, do you have any difficulty performing the following activities: 04/09/2019  Hearing? N  Vision? N  Difficulty concentrating or making decisions? Y  Comment sometimes  Walking or climbing stairs? Y  Dressing or bathing? N  Some recent data might be hidden     Immunizations and Health Maintenance Immunization History  Administered Date(s) Administered  . Pneumococcal Polysaccharide-23 05/12/2015  . Tdap 10/05/2017   Health Maintenance Due  Topic Date Due  . MAMMOGRAM  06/28/2017  . INFLUENZA VACCINE  12/01/2018    Patient Care Team: Lavada Mesi as PCP - General (Family Medicine) Leeroy Cha, MD as Consulting Physician (Neurosurgery) Silverio Decamp, MD as Consulting Physician (Family Medicine)  Indicate any recent Medical Services you may have received from other than [facility] providers in the past year (date may be approximate).     Assessment:   This is a routine wellness examination for [place].Physical assessment deferred to PCP.   Hearing/Vision screen No exam data present  Dietary issues and exercise activities discussed:   Diet  Breakfast: Lunch:  Dinner:       Goals   None    Depression Screen PHQ 2/9 Scores 10/30/2018 09/18/2018  PHQ - 2 Score 4 6  PHQ- 9 Score 16 22  Some encounter information is confidential and restricted. Go to Review Flowsheets activity to see all data.    Fall Risk No flowsheet data found.  Is the patient's home free of loose throw rugs in walkways, pet beds, electrical cords, etc?   {Blank single:19197::"yes","no"}      Grab bars in the bathroom? {Blank single:19197::"yes","no"}      Handrails on the stairs?   {Blank single:19197::"yes","no"}      Adequate lighting?   {Blank single:19197::"yes","no"}   Cognitive Function:        Screening Tests Health Maintenance  Topic Date Due  . MAMMOGRAM  06/28/2017  . INFLUENZA VACCINE  12/01/2018  . TETANUS/TDAP  10/06/2027  . HIV  Screening  Completed       Plan:   ***  I have personally reviewed and noted the  following in the patient's chart:   . Medical and social history . Use of alcohol, tobacco or illicit drugs  . Current medications and supplements . Functional ability and status . Nutritional status . Physical activity . Advanced directives . List of other physicians . Hospitalizations, surgeries, and ER visits in previous 12 months . Vitals . Screenings to include cognitive, depression, and falls . Referrals and appointments  In addition, I have reviewed and discussed with patient certain preventive protocols, quality metrics, and best practice recommendations. A written personalized care plan for preventive services as well as general preventive health recommendations were provided to patient.     Joanne Chars, LPN   [id number]

## 2019-06-18 ENCOUNTER — Ambulatory Visit: Payer: Medicare Other

## 2019-06-24 ENCOUNTER — Other Ambulatory Visit: Payer: Self-pay | Admitting: Physician Assistant

## 2019-06-24 DIAGNOSIS — F411 Generalized anxiety disorder: Secondary | ICD-10-CM

## 2019-07-01 DIAGNOSIS — M961 Postlaminectomy syndrome, not elsewhere classified: Secondary | ICD-10-CM | POA: Diagnosis not present

## 2019-07-01 DIAGNOSIS — M4802 Spinal stenosis, cervical region: Secondary | ICD-10-CM | POA: Diagnosis not present

## 2019-07-01 DIAGNOSIS — N83202 Unspecified ovarian cyst, left side: Secondary | ICD-10-CM | POA: Diagnosis not present

## 2019-07-01 DIAGNOSIS — G894 Chronic pain syndrome: Secondary | ICD-10-CM | POA: Diagnosis not present

## 2019-07-01 DIAGNOSIS — Z1231 Encounter for screening mammogram for malignant neoplasm of breast: Secondary | ICD-10-CM | POA: Diagnosis not present

## 2019-07-01 LAB — HM MAMMOGRAPHY

## 2019-07-04 ENCOUNTER — Other Ambulatory Visit: Payer: Self-pay | Admitting: Physician Assistant

## 2019-07-04 DIAGNOSIS — I10 Essential (primary) hypertension: Secondary | ICD-10-CM

## 2019-07-05 ENCOUNTER — Other Ambulatory Visit: Payer: Self-pay | Admitting: Physician Assistant

## 2019-07-05 DIAGNOSIS — G43009 Migraine without aura, not intractable, without status migrainosus: Secondary | ICD-10-CM

## 2019-07-08 ENCOUNTER — Encounter: Payer: Self-pay | Admitting: Physician Assistant

## 2019-07-08 ENCOUNTER — Other Ambulatory Visit: Payer: Self-pay

## 2019-07-08 ENCOUNTER — Ambulatory Visit (INDEPENDENT_AMBULATORY_CARE_PROVIDER_SITE_OTHER): Payer: Medicare Other | Admitting: Physician Assistant

## 2019-07-08 VITALS — BP 124/66 | HR 90 | Ht 63.0 in | Wt 149.0 lb

## 2019-07-08 DIAGNOSIS — M961 Postlaminectomy syndrome, not elsewhere classified: Secondary | ICD-10-CM

## 2019-07-08 DIAGNOSIS — G894 Chronic pain syndrome: Secondary | ICD-10-CM

## 2019-07-08 DIAGNOSIS — F331 Major depressive disorder, recurrent, moderate: Secondary | ICD-10-CM

## 2019-07-08 DIAGNOSIS — I10 Essential (primary) hypertension: Secondary | ICD-10-CM | POA: Diagnosis not present

## 2019-07-08 DIAGNOSIS — F430 Acute stress reaction: Secondary | ICD-10-CM | POA: Diagnosis not present

## 2019-07-08 DIAGNOSIS — F5101 Primary insomnia: Secondary | ICD-10-CM

## 2019-07-08 DIAGNOSIS — G2581 Restless legs syndrome: Secondary | ICD-10-CM | POA: Diagnosis not present

## 2019-07-08 DIAGNOSIS — F411 Generalized anxiety disorder: Secondary | ICD-10-CM

## 2019-07-08 MED ORDER — ROPINIROLE HCL 0.25 MG PO TABS: ORAL_TABLET | ORAL | 1 refills | Status: DC

## 2019-07-08 MED ORDER — VORTIOXETINE HBR 10 MG PO TABS: 10.0000 mg | ORAL_TABLET | Freq: Every day | ORAL | 5 refills | Status: DC

## 2019-07-08 MED ORDER — HYDROCHLOROTHIAZIDE 12.5 MG PO TABS: 12.5000 mg | ORAL_TABLET | Freq: Every day | ORAL | 3 refills | Status: DC | PRN

## 2019-07-08 MED ORDER — METOPROLOL SUCCINATE ER 100 MG PO TB24: 100.0000 mg | ORAL_TABLET | Freq: Every day | ORAL | 3 refills | Status: DC

## 2019-07-08 MED ORDER — ZOLPIDEM TARTRATE 10 MG PO TABS: 10.0000 mg | ORAL_TABLET | Freq: Every day | ORAL | 5 refills | Status: DC

## 2019-07-08 NOTE — Progress Notes (Signed)
Subjective:    Patient ID: [name], female    DOB: 1975/11/10, 44 y.o.   MRN: [id number]  HPI  Pt is a 44 yo female with HTN, chronic cervical neck pain, Depression, Anxiety, insomnia who presents to the clinic for medication refills and follow up.   She continues to have a lot of pain. She did meet with Dr. Sherley Bounds and he does think there is an indication for neck surgery again but wants her to do everything possible to treat pain before surgery. She is working with pain clinic and on lyrica, soma. She feels like helps some but request daily norco. Sent to neuropsychiatry for more help.  [name] helps her get to sleep but still waking up. She wonders if higher dose. She also notice "jumpy legs at night". She wonders if she could try anything.    She is still very anxious and down. She is on trintellix. She uses klonapin as needed but not daily.   .. Active Ambulatory Problems    Diagnosis Date Noted  . Anxiety state 07/06/2007  . Smoker 02/05/2010  . Depressed 06/26/2008  . INTERSTITIAL CYSTITIS 02/05/2010  . Insomnia 07/06/2007  . FATIGUE 07/02/2009  . HEADACHE 07/06/2007  . TROCHANTERIC BURSITIS 06/21/2010  . Tick-borne disease 08/09/2010  . PTSD (post-traumatic stress disorder) 12/14/2012  . Weight gain 11/29/2013  . Greater trochanteric bursitis 11/29/2013  . Constipation due to opioid therapy 01/17/2014  . Left shoulder pain 04/02/2014  . Carpal tunnel syndrome of left wrist 05/05/2014  . Myofascial pain syndrome 05/29/2014  . Paresthesia 06/03/2014  . Endometriosis 07/20/2014  . Tachycardia 08/13/2014  . S/P cervical spinal fusion 10/28/2014  . Therapeutic opioid induced constipation 12/16/2014  . Vasomotor flushing 06/15/2015  . GAD (generalized anxiety disorder) 06/15/2015  . Fixed pupils 08/31/2015  . Dysuria 08/31/2015  . Depression 08/31/2015  . Primary osteoarthritis of left first metacarpophalangeal joint 04/04/2016  . Palpitations  04/14/2016  . Balance problems 07/19/2016  . Dizziness 07/19/2016  . Primary osteoarthritis of right knee 09/05/2016  . Trigger thumb, left thumb 10/06/2016  . Chronic pain syndrome 01/08/2017  . 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  . First degree ankle sprain, left, initial encounter 08/29/2017  . Hypertension 10/06/2017  . Hx of cold sores 10/06/2017  . Asthma 01/29/2018  . Chronic neck pain 01/29/2018  . Traumatic ecchymosis of left foot 10/15/2018  . Mild episode of recurrent major depressive disorder ([place]) 10/30/2018  . Dog bite 02/15/2019  . Leukocytes in urine 03/04/2019  . Other microscopic hematuria 03/04/2019  . Left ovarian cyst 03/04/2019  . Cervical post-laminectomy syndrome 07/08/2019   Resolved Ambulatory Problems    Diagnosis Date Noted  . RHINITIS 10/13/2009  . Polyuria 11/04/2010  . Degenerative disc disease, cervical 10/31/2013  . Cervical disc disorder with radiculopathy of cervical region 11/29/2013  . Itching 11/29/2013  . Radiculitis of left cervical region 01/17/2014  . Nausea with vomiting 11/25/2014  . Dysmenorrhea 05/11/2015  . Cervical disc disorder with radiculopathy of cervical region 11/11/2015  . Acute upper respiratory infection 04/04/2016  . Localized primary carpometacarpal osteoarthritis, left 04/04/2016   Past Medical History:  Diagnosis Date  . Anxiety   . Complication of anesthesia   . Dysrhythmia   . Elevated heart rate and blood pressure   . Family history of adverse reaction to anesthesia   . Fibromyalgia   . IC (interstitial cystitis)   . Myofascial pain syndrome, cervical   . Neck pain   . Ovarian cyst   .  PONV (postoperative nausea and vomiting)       Review of Systems See HPI.     Objective:   Physical Exam Vitals reviewed.  Constitutional:      Appearance: Normal appearance.  HENT:     Head: Normocephalic.  Cardiovascular:     Rate and Rhythm: Normal rate and regular rhythm.     Pulses: Normal pulses.  Pulmonary:      Effort: Pulmonary effort is normal.  Neurological:     General: No focal deficit present.     Mental Status: She is alert and oriented to person, place, and time.  Psychiatric:        Mood and Affect: Mood normal.           Assessment & Plan:  .Marland KitchenAspynn was seen today for follow-up.  Diagnoses and all orders for this visit:  Cervical post-laminectomy syndrome  GAD (generalized anxiety disorder)  Anxiety state  Essential hypertension -     metoprolol succinate (TOPROL-XL) 100 MG 24 hr tablet; Take 1 tablet (100 mg total) by mouth daily. -     hydrochlorothiazide (HYDRODIURIL) 12.5 MG tablet; Take 1 tablet (12.5 mg total) by mouth daily as needed. APPT FOR FURTHER REFILLS  Anxiety as acute reaction to exceptional stress  Primary insomnia -     zolpidem (AMBIEN) 10 MG tablet; Take 1 tablet (10 mg total) by mouth at bedtime.  Chronic pain syndrome  Moderate episode of recurrent major depressive disorder (HCC) -     vortioxetine HBr (TRINTELLIX) 10 MG TABS tablet; Take 1 tablet (10 mg total) by mouth daily.  RLS (restless legs syndrome) -     rOPINIRole (REQUIP) 0.25 MG tablet; Take 1-3 tablets at bedtime for RLS.   Continue on BP medication. BP looks great.  Refilled ambien for sleep.  Add requip for RLS.  Increased trintellix for mood and pain.  Ok to refill clonazepam as needed. She does not need refills right now.  Continue with pain management.  Follow up in 6 months.

## 2019-07-10 ENCOUNTER — Ambulatory Visit: Payer: Medicare Other

## 2019-07-10 NOTE — Progress Notes (Unsigned)
Subjective:   [name] is a 44 y.o. female who presents for an Initial Medicare Annual Wellness Visit.  Review of Systems    No ROS.  Medicare Wellness Virtual Visit.  Visual/audio telehealth visit, UTA vital signs.   See social history for additional risk factors.       Sleep patterns:    Home Safety/Smoke Alarms: Feels safe in home. Smoke alarms in place.  Living environment; Seat Belt Safety/Bike Helmet: Wears seat belt.   Female:   Pap-       Mammo-  UTD     Dexa scan- not eligible due to age       64- not eligible due to age    Objective:    There were no vitals filed for this visit. There is no height or weight on file to calculate BMI.  Advanced Directives 04/09/2019  Does Patient Have a Medical Advance Directive? No  Would patient like information on creating a medical advance directive? Yes (MAU/Ambulatory/Procedural Areas - Information given)  Some encounter information is confidential and restricted. Go to Review Flowsheets activity to see all data.    Current Medications (verified) Outpatient Encounter Medications as of 07/10/2019  Medication Sig  . BREO ELLIPTA 100-25 MCG/INH AEPB TAKE 1 PUFF BY MOUTH EVERY DAY (Patient taking differently: as needed. )  . clonazePAM (KLONOPIN) 0.5 MG tablet TAKE ONE TABLET AS NEEDED FOR ANXIETY NO MORE THAN ONCE A DAY.  . hydrochlorothiazide (HYDRODIURIL) 12.5 MG tablet Take 1 tablet (12.5 mg total) by mouth daily as needed. APPT FOR FURTHER REFILLS  . hydrOXYzine (ATARAX/VISTARIL) 50 MG tablet Take 1 tablet (50 mg total) by mouth 3 (three) times daily as needed. NEEDS APPT  . metoprolol succinate (TOPROL-XL) 100 MG 24 hr tablet Take 1 tablet (100 mg total) by mouth daily.  . pregabalin (LYRICA) 200 MG capsule Take 200 mg by mouth 3 (three) times daily.  . promethazine (PHENERGAN) 25 MG tablet TAKE 1 TABLET (25 MG TOTAL) BY MOUTH EVERY 8 (EIGHT) HOURS AS NEEDED FOR NAUSEA OR VOMITING.  . rizatriptan (MAXALT)  10 MG tablet TAKE 1 TABLET AS NEEDED FOR MIGRAINE, MAY REPEAT 2 HRS PRN APPT FOR FURTHER REFILLS  . rOPINIRole (REQUIP) 0.25 MG tablet Take 1-3 tablets at bedtime for RLS.  [name] valACYclovir (VALTREX) 1000 MG tablet TAKE 1 TABLET BY MOUTH EVERY DAY  . vortioxetine HBr (TRINTELLIX) 10 MG TABS tablet Take 1 tablet (10 mg total) by mouth daily.  [name] zolpidem (AMBIEN) 10 MG tablet Take 1 tablet (10 mg total) by mouth at bedtime.   No facility-administered encounter medications on file as of 07/10/2019.    Allergies (verified) Gadolinium derivatives, Prednisone, Amitriptyline, Eggs or egg-derived products, Hydrocodone-acetaminophen, Lunesta [eszopiclone], and Tramadol   History: Past Medical History:  Diagnosis Date  . Anxiety   . Asthma   . Complication of anesthesia    likes iv med and scopolamine patch also, limited neck motion  . Dysrhythmia    tachycardia  . Elevated heart rate and blood pressure    takes toprol for increased heart rate  . Family history of adverse reaction to anesthesia    mother got blood clots after anesthesia , mother on heparin now  . Fibromyalgia   . Headache(784.0)    migraines  . IC (interstitial cystitis)   . Insomnia   . Myofascial pain syndrome, cervical   . Neck pain    C 2 and C 3 slipped disc has neck pain with, limited neck  motion  . Ovarian cyst    x 2now  . PONV (postoperative nausea and vomiting)    Past Surgical History:  Procedure Laterality Date  . CARPAL TUNNEL RELEASE Left 06/05/2014   Procedure: LEFT CARPAL TUNNEL RELEASE;  Surgeon: Daryll Brod, MD;  Location: [place];  Service: Orthopedics;  Laterality: Left;  . CARPAL TUNNEL RELEASE Right 08/25/2014   Procedure: RIGHT CARPAL TUNNEL RELEASE;  Surgeon: Daryll Brod, MD;  Location: [place];  Service: Orthopedics;  Laterality: Right;  . CARPAL TUNNEL RELEASE Left 07/14/2015   Procedure: LEFT CARPAL TUNNEL RELEASE;  Surgeon: Daryll Brod, MD;  Location: [place];  Service: Orthopedics;  Laterality: Left;  . CERVICAL SPINE SURGERY  11/2013   dr Joya Salm  . CESAREAN SECTION     x2  . DILATION AND EVACUATION N/A 05/07/2013   Procedure: DILATATION AND EVACUATION with tissue sent for chromosome analysis;  Surgeon: Lovenia Kim, MD;  Location: [facility];  Service: Gynecology;  Laterality: N/A;  . EPIDURAL STEROID INJECTION  03/20/15  . KNEE ARTHROSCOPY WITH MEDIAL MENISECTOMY Right 04/09/2019   Procedure: Right knee arthroscopy fat pad resection with partial medial meniscectomy;  Surgeon: Nicholes Stairs, MD;  Location: [facility];  Service: Orthopedics;  Laterality: Right;  60 mins  . OVARIAN CYST REMOVAL    . PELVIC LAPAROSCOPY  2003   endometriosis  . POSTERIOR CERVICAL FUSION/FORAMINOTOMY N/A 10/28/2014   Procedure: Cervical Four-Cervical Seven Posterior cervical fusion with lateral mass fixation;  Surgeon: Eustace Moore, MD;  Location: [place] [facility];  Service: Neurosurgery;  Laterality: N/A;  posterior  . ROBOTIC ASSISTED LAPAROSCOPIC LYSIS OF ADHESION N/A 07/18/2014   Procedure: ROBOTIC ASSISTED LAPAROSCOPIC  EXCISION POSTERIOR  UTERINE WALL MASS; EXCISION RIGHT MESSALEINGEAL MASS, EXCISION AND ABLATION CULDASAC ENDOMETRIOSIS;  Surgeon: Brien Few, MD;  Location: [facility];  Service: Gynecology;  Laterality: N/A;  . ROBOTIC ASSISTED TOTAL HYSTERECTOMY WITH SALPINGECTOMY Bilateral 05/11/2015   Procedure: ROBOTIC ASSISTED TOTAL HYSTERECTOMY WITH BILATERAL SALPINGECTOMY;  Surgeon: Brien Few, MD;  Location: [facility];  Service: Gynecology;  Laterality: Bilateral;   Family History  Problem Relation Age of Onset  . Heart attack Father   . ADD / ADHD Brother   . Depression Brother   . Depression Cousin    Social History   Socioeconomic History  . Marital status: Divorced    Spouse name: Not on file  . Number of children: 2  . Years of education: Not on file  . Highest education level: Not on file  Occupational  History  . 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 Smoking status: Current Some Day Smoker    Packs/day: 0.50    Years: 18.00    Pack years: 9.00    Types: Cigarettes  . Smokeless tobacco: Never Used  Substance and Sexual Activity  . Alcohol use: No    Alcohol/week: 0.0 standard drinks  . Drug use: No  . Sexual activity: Yes    Birth control/protection: Surgical  Other Topics Concern  . Not on file  Social History Narrative   Going through separation.  Children Sage and Luisa Hart   Social Determinants of Health   Financial Resource Strain:   . Difficulty of Paying Living Expenses: Not on file  Food Insecurity:   . Worried About Charity fundraiser in the Last Year: Not on file  . Ran Out of Food in  the Last Year: Not on file  Transportation Needs:   . Lack of Transportation (Medical): Not on file  . Lack of Transportation (Non-Medical): Not on file  Physical Activity:   . Days of Exercise per Week: Not on file  . Minutes of Exercise per Session: Not on file  Stress:   . Feeling of Stress : Not on file  Social Connections:   . Frequency of Communication with Friends and Family: Not on file  . Frequency of Social Gatherings with Friends and Family: Not on file  . Attends Religious Services: Not on file  . Active Member of Clubs or Organizations: Not on file  . Attends Archivist Meetings: Not on file  . Marital Status: Not on file    Tobacco Counseling Ready to quit: Not Answered Counseling given: Not Answered   Clinical Intake:                        Activities of Daily Living In your present state of health, do you have any difficulty performing the following activities: 04/09/2019  Hearing? N  Vision? N  Difficulty concentrating or making decisions? Y  Comment sometimes  Walking or climbing stairs? Y  Dressing or bathing? N  Some recent data might be hidden     Immunizations and Health  Maintenance Immunization History  Administered Date(s) Administered  . Pneumococcal Polysaccharide-23 05/12/2015  . Tdap 10/05/2017   There are no preventive care reminders to display for this patient.  Patient Care Team: Lavada Mesi as PCP - General (Family Medicine) Leeroy Cha, MD as Consulting Physician (Neurosurgery) Silverio Decamp, MD as Consulting Physician (Family Medicine)  Indicate any recent Medical Services you may have received from other than [facility] providers in the past year (date may be approximate).     Assessment:   This is a routine wellness examination for [place].Physical assessment deferred to PCP.   Hearing/Vision screen  Hearing Screening   125Hz  250Hz  500Hz  1000Hz  2000Hz  3000Hz  4000Hz  6000Hz  8000Hz   Right ear:           Left ear:           Comments: Hearing test not performed- visit done via telephone due to COVID pandemic  Vision Screening Comments: Vision test not performed- visit done over the telephone due to [place] pandemic  Dietary issues and exercise activities discussed:   Diet  Breakfast: Lunch:  Dinner:       Goals   None    Depression Screen PHQ 2/9 Scores 07/08/2019 10/30/2018 09/18/2018  PHQ - 2 Score 4 4 6   PHQ- 9 Score 19 16 22   Some encounter information is confidential and restricted. Go to Review Flowsheets activity to see all data.    Fall Risk No flowsheet data found.  Is the patient's home free of loose throw rugs in walkways, pet beds, electrical cords, etc?   {Blank single:19197::"yes","no"}      Grab bars in the bathroom? {Blank single:19197::"yes","no"}      Handrails on the stairs?   {Blank single:19197::"yes","no"}      Adequate lighting?   {Blank single:19197::"yes","no"}   Cognitive Function:        Screening Tests Health Maintenance  Topic Date Due  . INFLUENZA VACCINE  07/31/2019 (Originally 12/01/2018)  . MAMMOGRAM  06/30/2021  . TETANUS/TDAP  10/06/2027  . HIV Screening   Completed     Plan:   ***  I have personally reviewed and noted the  following in the patient's chart:   . Medical and social history . Use of alcohol, tobacco or illicit drugs  . Current medications and supplements . Functional ability and status . Nutritional status . Physical activity . Advanced directives . List of other physicians . Hospitalizations, surgeries, and ER visits in previous 12 months . Vitals . Screenings to include cognitive, depression, and falls . Referrals and appointments  In addition, I have reviewed and discussed with patient certain preventive protocols, quality metrics, and best practice recommendations. A written personalized care plan for preventive services as well as general preventive health recommendations were provided to patient.     Joanne Chars, LPN   [id number]

## 2019-07-11 ENCOUNTER — Encounter: Payer: Self-pay | Admitting: Physician Assistant

## 2019-07-12 ENCOUNTER — Other Ambulatory Visit: Payer: Self-pay | Admitting: Physician Assistant

## 2019-07-12 DIAGNOSIS — F411 Generalized anxiety disorder: Secondary | ICD-10-CM

## 2019-07-12 DIAGNOSIS — F430 Acute stress reaction: Secondary | ICD-10-CM

## 2019-07-16 ENCOUNTER — Ambulatory Visit (INDEPENDENT_AMBULATORY_CARE_PROVIDER_SITE_OTHER): Payer: Medicare Other | Admitting: *Deleted

## 2019-07-16 VITALS — Ht 63.0 in | Wt 142.0 lb

## 2019-07-16 DIAGNOSIS — Z0000 Encounter for general adult medical examination without abnormal findings: Secondary | ICD-10-CM | POA: Diagnosis not present

## 2019-07-16 NOTE — Progress Notes (Signed)
Subjective:   [name] is a 44 y.o. female who presents for an Initial Medicare Annual Wellness Visit.  Review of Systems    No ROS.  Medicare Wellness Virtual Visit.  Visual/audio telehealth visit, UTA vital signs.   See social history for additional risk factors.     Cardiac Risk Factors include: sedentary lifestyle;smoking/ tobacco exposure Sleep patterns: Getting 5 hours of sleep a night. Wakes up 3-4 times a night to void. Wakes up and feels sluggish and tired.  Home Safety/Smoke Alarms: Feels safe in home. Smoke alarms in place.  Living environment; Lives with children in a 2 story home and stairs have hand rails on them. Shower is a step tub combo and no grab bars in place. Seat Belt Safety/Bike Helmet: Wears seat belt.   Female:   Pap- hysterectomy partial      Mammo- UTD       Dexa scan- not eligible due to age       CCS-not eligible due to age     Objective:    Today's Vitals   07/16/19 1001  Weight: 142 lb (64.4 kg)  Height: 5\' 3"  (1.6 m)  PainSc: 8    Body mass index is 25.15 kg/m.  Advanced Directives 07/16/2019 04/09/2019  Does Patient Have a Medical Advance Directive? No No  Would patient like information on creating a medical advance directive? No - Patient declined Yes (MAU/Ambulatory/Procedural Areas - Information given)  Some encounter information is confidential and restricted. Go to Review Flowsheets activity to see all data.    Current Medications (verified) Outpatient Encounter Medications as of 07/16/2019  Medication Sig  . BREO ELLIPTA 100-25 MCG/INH AEPB TAKE 1 PUFF BY MOUTH EVERY DAY (Patient taking differently: as needed. )  . carisoprodol (SOMA) 350 MG tablet Take 350 mg by mouth 3 (three) times daily.  . clonazePAM (KLONOPIN) 0.5 MG tablet TAKE ONE TABLET AS NEEDED FOR ANXIETY NO MORE THAN ONCE A DAY.  . hydrochlorothiazide (HYDRODIURIL) 12.5 MG tablet Take 1 tablet (12.5 mg total) by mouth daily as needed. APPT FOR FURTHER  REFILLS  . hydrOXYzine (ATARAX/VISTARIL) 50 MG tablet Take 1 tablet (50 mg total) by mouth 3 (three) times daily as needed. NEEDS APPT  . metoprolol succinate (TOPROL-XL) 100 MG 24 hr tablet Take 1 tablet (100 mg total) by mouth daily.  . pregabalin (LYRICA) 200 MG capsule Take 200 mg by mouth 3 (three) times daily.  . promethazine (PHENERGAN) 25 MG tablet TAKE 1 TABLET (25 MG TOTAL) BY MOUTH EVERY 8 (EIGHT) HOURS AS NEEDED FOR NAUSEA OR VOMITING.  . rizatriptan (MAXALT) 10 MG tablet TAKE 1 TABLET AS NEEDED FOR MIGRAINE, MAY REPEAT 2 HRS PRN APPT FOR FURTHER REFILLS  . rOPINIRole (REQUIP) 0.25 MG tablet Take 1-3 tablets at bedtime for RLS.  [name] valACYclovir (VALTREX) 1000 MG tablet TAKE 1 TABLET BY MOUTH EVERY DAY  . vortioxetine HBr (TRINTELLIX) 10 MG TABS tablet Take 1 tablet (10 mg total) by mouth daily.  [name] zolpidem (AMBIEN) 10 MG tablet Take 1 tablet (10 mg total) by mouth at bedtime.   No facility-administered encounter medications on file as of 07/16/2019.    Allergies (verified) Gadolinium derivatives, Prednisone, Amitriptyline, Eggs or egg-derived products, Hydrocodone-acetaminophen, Lunesta [eszopiclone], and Tramadol   History: Past Medical History:  Diagnosis Date  . Anxiety   . Asthma   . Complication of anesthesia    likes iv med and scopolamine patch also, limited neck motion  . Dysrhythmia    tachycardia  .  Elevated heart rate and blood pressure    takes toprol for increased heart rate  . Family history of adverse reaction to anesthesia    mother got blood clots after anesthesia , mother on heparin now  . Fibromyalgia   . Headache(784.0)    migraines  . IC (interstitial cystitis)   . Insomnia   . Myofascial pain syndrome, cervical   . Neck pain    C 2 and C 3 slipped disc has neck pain with, limited neck motion  . Ovarian cyst    x 2now  . PONV (postoperative nausea and vomiting)    Past Surgical History:  Procedure Laterality Date  . CARPAL TUNNEL RELEASE Left  06/05/2014   Procedure: LEFT CARPAL TUNNEL RELEASE;  Surgeon: Daryll Brod, MD;  Location: [place];  Service: Orthopedics;  Laterality: Left;  . CARPAL TUNNEL RELEASE Right 08/25/2014   Procedure: RIGHT CARPAL TUNNEL RELEASE;  Surgeon: Daryll Brod, MD;  Location: [place];  Service: Orthopedics;  Laterality: Right;  . CARPAL TUNNEL RELEASE Left 07/14/2015   Procedure: LEFT CARPAL TUNNEL RELEASE;  Surgeon: Daryll Brod, MD;  Location: [place];  Service: Orthopedics;  Laterality: Left;  . CERVICAL SPINE SURGERY  11/2013   dr Joya Salm  . CESAREAN SECTION     x2  . DILATION AND EVACUATION N/A 05/07/2013   Procedure: DILATATION AND EVACUATION with tissue sent for chromosome analysis;  Surgeon: Lovenia Kim, MD;  Location: [facility];  Service: Gynecology;  Laterality: N/A;  . EPIDURAL STEROID INJECTION  03/20/15  . KNEE ARTHROSCOPY WITH MEDIAL MENISECTOMY Right 04/09/2019   Procedure: Right knee arthroscopy fat pad resection with partial medial meniscectomy;  Surgeon: Nicholes Stairs, MD;  Location: [facility];  Service: Orthopedics;  Laterality: Right;  60 mins  . OVARIAN CYST REMOVAL    . PELVIC LAPAROSCOPY  2003   endometriosis  . POSTERIOR CERVICAL FUSION/FORAMINOTOMY N/A 10/28/2014   Procedure: Cervical Four-Cervical Seven Posterior cervical fusion with lateral mass fixation;  Surgeon: Eustace Moore, MD;  Location: [place] [facility];  Service: Neurosurgery;  Laterality: N/A;  posterior  . ROBOTIC ASSISTED LAPAROSCOPIC LYSIS OF ADHESION N/A 07/18/2014   Procedure: ROBOTIC ASSISTED LAPAROSCOPIC  EXCISION POSTERIOR  UTERINE WALL MASS; EXCISION RIGHT MESSALEINGEAL MASS, EXCISION AND ABLATION CULDASAC ENDOMETRIOSIS;  Surgeon: Brien Few, MD;  Location: [facility];  Service: Gynecology;  Laterality: N/A;  . ROBOTIC ASSISTED TOTAL HYSTERECTOMY WITH SALPINGECTOMY Bilateral 05/11/2015   Procedure: ROBOTIC ASSISTED TOTAL HYSTERECTOMY WITH BILATERAL  SALPINGECTOMY;  Surgeon: Brien Few, MD;  Location: [facility];  Service: Gynecology;  Laterality: Bilateral;   Family History  Problem Relation Age of Onset  . Heart attack Father   . ADD / ADHD Brother   . Depression Brother   . Depression Cousin    Social History   Socioeconomic History  . Marital status: Divorced    Spouse name: Not on file  . Number of children: 2  . Years of education: 36  . Highest education level: Associate degree: occupational, Hotel manager, or vocational program  Occupational History  . 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 Smoking status: Current Some Day Smoker    Packs/day: 0.50    Years: 18.00    Pack years: 9.00    Types: Cigarettes  . Smokeless tobacco: Never Used  Substance and Sexual Activity  . Alcohol use: No    Alcohol/week: 0.0 standard  drinks  . Drug use: No  . Sexual activity: Yes    Birth control/protection: Surgical  Other Topics Concern  . Not on file  Social History Narrative   Going through separation.  Children Sage and Luisa Hart.  Stays in a lot of pain all the time   Social Determinants of Health   Financial Resource Strain:   . Difficulty of Paying Living Expenses:   Food Insecurity:   . Worried About Charity fundraiser in the Last Year:   . Arboriculturist in the Last Year:   Transportation Needs:   . Film/video editor (Medical):   [name] Lack of Transportation (Non-Medical):   Physical Activity:   . Days of Exercise per Week:   . Minutes of Exercise per Session:   Stress:   . Feeling of Stress :   Social Connections:   . Frequency of Communication with Friends and Family:   . Frequency of Social Gatherings with Friends and Family:   . Attends Religious Services:   . Active Member of Clubs or Organizations:   . Attends Archivist Meetings:   [name] Marital Status:     Tobacco Counseling Ready to quit: No Counseling given: Not Answered   Clinical  Intake:  Pre-visit preparation completed: Yes  Pain : 0-10 Pain Score: 8  Pain Type: Chronic pain Pain Location: Neck Pain Orientation: Posterior Pain Radiating Towards: shoulders Pain Descriptors / Indicators: Throbbing, Aching, Shooting, Tingling Pain Onset: More than a month ago Pain Frequency: Constant Pain Relieving Factors: pain meds  Pain Relieving Factors: pain meds  Nutritional Risks: None Diabetes: No  How often do you need to have someone help you when you read instructions, pamphlets, or other written materials from your doctor or pharmacy?: 1 - Never What is the last grade level you completed in school?: 14  Interpreter Needed?: No  Information entered by :: Orlie Dakin, LPN   Activities of Daily Living In your present state of health, do you have any difficulty performing the following activities: 07/16/2019 04/09/2019  Hearing? N N  Vision? Y N  Comment blurred vision -  Difficulty concentrating or making decisions? Y Y  Comment - sometimes  Walking or climbing stairs? Y Y  Dressing or bathing? N N  Doing errands, shopping? N -  Preparing Food and eating ? N -  Using the Toilet? N -  In the past six months, have you accidently leaked urine? N -  Do you have problems with loss of bowel control? N -  Managing your Medications? N -  Managing your Finances? N -  Housekeeping or managing your Housekeeping? N -  Some recent data might be hidden     Immunizations and Health Maintenance Immunization History  Administered Date(s) Administered  . Pneumococcal Polysaccharide-23 05/12/2015  . Tdap 10/05/2017   There are no preventive care reminders to display for this patient.  Patient Care Team: Lavada Mesi as PCP - General (Family Medicine) Leeroy Cha, MD as Consulting Physician (Neurosurgery) Silverio Decamp, MD as Consulting Physician (Family Medicine)  Indicate any recent Medical Services you may have received from other than  [facility] providers in the past year (date may be approximate).     Assessment:   This is a routine wellness examination for [place].Physical assessment deferred to PCP.   Hearing/Vision screen  Hearing Screening   125Hz  250Hz  500Hz  1000Hz  2000Hz  3000Hz  4000Hz  6000Hz  8000Hz   Right ear:  Left ear:           Comments: Hearing test not performed- visit done via telephone due to COVID pandemic  Vision Screening Comments: Vision test not performed- visit done via telephone due to [place] pandemic  Dietary issues and exercise activities discussed: Current Exercise Habits: The patient does not participate in regular exercise at present, Exercise limited by: orthopedic condition(s) Diet  Eats a healthy diet of fruits, vegetables and proteins. Breakfast:skips Lunch: sandwich Dinner: Meat and vegetables      Goals    . Patient Stated     Would like to start being able to manage her pain better.      Depression Screen PHQ 2/9 Scores 07/16/2019 07/08/2019 10/30/2018 09/18/2018  PHQ - 2 Score 3 4 4 6   PHQ- 9 Score 17 19 16 22   Some encounter information is confidential and restricted. Go to Review Flowsheets activity to see all data.    Fall Risk Fall Risk  07/16/2019  Falls in the past year? 1  Number falls in past yr: 1  Injury with Fall? 0  Risk for fall due to : History of fall(s);Impaired balance/gait;Medication side effect  Follow up Falls prevention discussed    Is the patient's home free of loose throw rugs in walkways, pet beds, electrical cords, etc?   yes      Grab bars in the bathroom? no      Handrails on the stairs?   yes      Adequate lighting?   yes   Cognitive Function:     6CIT Screen 07/16/2019  What Year? 0 points  What month? 0 points  What time? 0 points  Count back from 20 0 points  Months in reverse 0 points  Repeat phrase 2 points  Total Score 2    Screening Tests Health Maintenance  Topic Date Due  . MAMMOGRAM  06/30/2021  . TETANUS/TDAP   10/06/2027  . HIV Screening  Completed  . INFLUENZA VACCINE  Discontinued     Plan:    Please schedule your next medicare wellness visit with me in 1 yr.  Ms. [place] , Thank you for taking time to come for your Medicare Wellness Visit. I appreciate your ongoing commitment to your health goals. Please review the following plan we discussed and let me know if I can assist you in the future.   These are the goals we discussed: Goals    . Patient Stated     Would like to start being able to manage her pain better.       This is a list of the screening recommended for you and due dates:  Health Maintenance  Topic Date Due  . Mammogram  06/30/2021  . Tetanus Vaccine  10/06/2027  . HIV Screening  Completed  . Flu Shot  Discontinued     I have personally reviewed and noted the following in the patient's chart:   . Medical and social history . Use of alcohol, tobacco or illicit drugs  . Current medications and supplements . Functional ability and status . Nutritional status . Physical activity . Advanced directives . List of other physicians . Hospitalizations, surgeries, and ER visits in previous 12 months . Vitals . Screenings to include cognitive, depression, and falls . Referrals and appointments  In addition, I have reviewed and discussed with patient certain preventive protocols, quality metrics, and best practice recommendations. A written personalized care plan for preventive services as well as general preventive health recommendations  were provided to patient.     Joanne Chars, LPN   [id number]

## 2019-07-16 NOTE — Patient Instructions (Signed)
Please schedule your next medicare wellness visit with me in 1 yr.  [name] , Thank you for taking time to come for your Medicare Wellness Visit. I appreciate your ongoing commitment to your health goals. Please review the following plan we discussed and let me know if I can assist you in the future.   These are the goals we discussed: Goals    . Patient Stated     Would like to start being able to manage her pain better.

## 2019-07-30 ENCOUNTER — Other Ambulatory Visit: Payer: Self-pay | Admitting: Physician Assistant

## 2019-07-30 DIAGNOSIS — G2581 Restless legs syndrome: Secondary | ICD-10-CM

## 2019-08-13 ENCOUNTER — Other Ambulatory Visit: Payer: Self-pay | Admitting: Physician Assistant

## 2019-08-13 DIAGNOSIS — Z8619 Personal history of other infectious and parasitic diseases: Secondary | ICD-10-CM

## 2019-08-13 DIAGNOSIS — A6000 Herpesviral infection of urogenital system, unspecified: Secondary | ICD-10-CM

## 2019-08-16 ENCOUNTER — Ambulatory Visit (INDEPENDENT_AMBULATORY_CARE_PROVIDER_SITE_OTHER): Payer: Medicare Other | Admitting: Physician Assistant

## 2019-08-16 ENCOUNTER — Encounter: Payer: Self-pay | Admitting: Physician Assistant

## 2019-08-16 DIAGNOSIS — Z5329 Procedure and treatment not carried out because of patient's decision for other reasons: Secondary | ICD-10-CM

## 2019-08-16 NOTE — Progress Notes (Signed)
No show

## 2019-08-17 DIAGNOSIS — S61217A Laceration without foreign body of left little finger without damage to nail, initial encounter: Secondary | ICD-10-CM | POA: Diagnosis not present

## 2019-08-17 DIAGNOSIS — Z888 Allergy status to other drugs, medicaments and biological substances status: Secondary | ICD-10-CM | POA: Diagnosis not present

## 2019-08-17 DIAGNOSIS — S60051A Contusion of right little finger without damage to nail, initial encounter: Secondary | ICD-10-CM | POA: Diagnosis not present

## 2019-08-17 DIAGNOSIS — F17210 Nicotine dependence, cigarettes, uncomplicated: Secondary | ICD-10-CM | POA: Diagnosis not present

## 2019-08-17 DIAGNOSIS — Z91012 Allergy to eggs: Secondary | ICD-10-CM | POA: Diagnosis not present

## 2019-08-17 DIAGNOSIS — Z91041 Radiographic dye allergy status: Secondary | ICD-10-CM | POA: Diagnosis not present

## 2019-08-17 DIAGNOSIS — M7989 Other specified soft tissue disorders: Secondary | ICD-10-CM | POA: Diagnosis not present

## 2019-08-17 DIAGNOSIS — Z79899 Other long term (current) drug therapy: Secondary | ICD-10-CM | POA: Diagnosis not present

## 2019-08-17 DIAGNOSIS — R2231 Localized swelling, mass and lump, right upper limb: Secondary | ICD-10-CM | POA: Diagnosis not present

## 2019-08-20 ENCOUNTER — Ambulatory Visit (INDEPENDENT_AMBULATORY_CARE_PROVIDER_SITE_OTHER): Payer: Medicare Other | Admitting: Physician Assistant

## 2019-08-20 ENCOUNTER — Other Ambulatory Visit: Payer: Self-pay

## 2019-08-20 ENCOUNTER — Encounter: Payer: Self-pay | Admitting: Physician Assistant

## 2019-08-20 VITALS — BP 133/65 | HR 83 | Ht 63.0 in | Wt 150.0 lb

## 2019-08-20 DIAGNOSIS — G894 Chronic pain syndrome: Secondary | ICD-10-CM

## 2019-08-20 DIAGNOSIS — Z981 Arthrodesis status: Secondary | ICD-10-CM

## 2019-08-20 DIAGNOSIS — R4586 Emotional lability: Secondary | ICD-10-CM | POA: Diagnosis not present

## 2019-08-20 DIAGNOSIS — R2689 Other abnormalities of gait and mobility: Secondary | ICD-10-CM | POA: Diagnosis not present

## 2019-08-20 DIAGNOSIS — R42 Dizziness and giddiness: Secondary | ICD-10-CM | POA: Diagnosis not present

## 2019-08-20 DIAGNOSIS — F411 Generalized anxiety disorder: Secondary | ICD-10-CM | POA: Diagnosis not present

## 2019-08-20 DIAGNOSIS — M7918 Myalgia, other site: Secondary | ICD-10-CM

## 2019-08-20 DIAGNOSIS — F330 Major depressive disorder, recurrent, mild: Secondary | ICD-10-CM

## 2019-08-20 MED ORDER — KETOROLAC TROMETHAMINE 10 MG PO TABS: ORAL_TABLET | ORAL | 2 refills | Status: DC

## 2019-08-20 MED ORDER — ARIPIPRAZOLE 2 MG PO TABS: 2.0000 mg | ORAL_TABLET | Freq: Every day | ORAL | 1 refills | Status: DC

## 2019-08-20 MED ORDER — TRIAMCINOLONE ACETONIDE 0.1 % EX CREA: 1.0000 "application " | TOPICAL_CREAM | Freq: Two times a day (BID) | CUTANEOUS | 0 refills | Status: DC

## 2019-08-20 NOTE — Progress Notes (Signed)
Subjective:    Patient ID: [name], female    DOB: Sep 30, 1975, 44 y.o.   MRN: [id number]  HPI  Pt is a 44 yo female who presents to the clinic with multiple concerns.   She continues to have neck pain. She has chronic neck pain with cervical DDD and past fusion. She is taking lyrica, soma. She recently has been having some dizziness and balance problems. She also having more headaches when neck pain is bad. She has not seen eye doctor.   Had laceration and nodule to pinky fingers of both hands and had I and D and stitches placed.    Her mood is still up and down. Her son was started on abilify and done really well. She wonders if that could be added to her medications. She finds she is easily angered and lots of mood changes.     .. Active Ambulatory Problems    Diagnosis Date Noted  . Anxiety state 07/06/2007  . Smoker 02/05/2010  . Depressed 06/26/2008  . INTERSTITIAL CYSTITIS 02/05/2010  . Insomnia 07/06/2007  . FATIGUE 07/02/2009  . HEADACHE 07/06/2007  . TROCHANTERIC BURSITIS 06/21/2010  . Tick-borne disease 08/09/2010  . PTSD (post-traumatic stress disorder) 12/14/2012  . Weight gain 11/29/2013  . Greater trochanteric bursitis 11/29/2013  . Constipation due to opioid therapy 01/17/2014  . Left shoulder pain 04/02/2014  . Carpal tunnel syndrome of left wrist 05/05/2014  . Myofascial pain syndrome 05/29/2014  . Paresthesia 06/03/2014  . Endometriosis 07/20/2014  . Tachycardia 08/13/2014  . S/P cervical spinal fusion 10/28/2014  . Therapeutic opioid induced constipation 12/16/2014  . Vasomotor flushing 06/15/2015  . GAD (generalized anxiety disorder) 06/15/2015  . Fixed pupils 08/31/2015  . Dysuria 08/31/2015  . Depression 08/31/2015  . Primary osteoarthritis of left first metacarpophalangeal joint 04/04/2016  . Palpitations 04/14/2016  . Balance problems 07/19/2016  . Dizziness 07/19/2016  . Primary osteoarthritis of right knee 09/05/2016  .  Trigger thumb, left thumb 10/06/2016  . Chronic pain syndrome 01/08/2017  . 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  . First degree ankle sprain, left, initial encounter 08/29/2017  . Hypertension 10/06/2017  . Hx of cold sores 10/06/2017  . Asthma 01/29/2018  . Chronic neck pain 01/29/2018  . Traumatic ecchymosis of left foot 10/15/2018  . Mild episode of recurrent major depressive disorder ([place]) 10/30/2018  . Dog bite 02/15/2019  . Leukocytes in urine 03/04/2019  . Other microscopic hematuria 03/04/2019  . Left ovarian cyst 03/04/2019  . Cervical post-laminectomy syndrome 07/08/2019  . RLS (restless legs syndrome) 07/08/2019  . Mood changes 08/25/2019   Resolved Ambulatory Problems    Diagnosis Date Noted  . RHINITIS 10/13/2009  . Polyuria 11/04/2010  . Degenerative disc disease, cervical 10/31/2013  . Cervical disc disorder with radiculopathy of cervical region 11/29/2013  . Itching 11/29/2013  . Radiculitis of left cervical region 01/17/2014  . Nausea with vomiting 11/25/2014  . Dysmenorrhea 05/11/2015  . Cervical disc disorder with radiculopathy of cervical region 11/11/2015  . Acute upper respiratory infection 04/04/2016  . Localized primary carpometacarpal osteoarthritis, left 04/04/2016   Past Medical History:  Diagnosis Date  . Anxiety   . Complication of anesthesia   . Dysrhythmia   . Elevated heart rate and blood pressure   . Family history of adverse reaction to anesthesia   . Fibromyalgia   . IC (interstitial cystitis)   . Myofascial pain syndrome, cervical   . Neck pain   . Ovarian cyst   . PONV (postoperative  nausea and vomiting)      Review of Systems See HPI.     Objective:   Physical Exam Vitals reviewed.  Constitutional:      Appearance: Normal appearance.  HENT:     Head: Normocephalic.  Cardiovascular:     Rate and Rhythm: Normal rate and regular rhythm.     Pulses: Normal pulses.     Heart sounds: Normal heart sounds.  Pulmonary:     Effort:  Pulmonary effort is normal.     Breath sounds: Normal breath sounds.  Musculoskeletal:     Cervical back: Normal range of motion.  Lymphadenopathy:     Cervical: No cervical adenopathy.  Neurological:     General: No focal deficit present.     Mental Status: She is alert and oriented to person, place, and time.     Comments: Negative dix hallpike.   Psychiatric:        Mood and Affect: Mood normal.           Assessment & Plan:  .Marland KitchenSheritha was seen today for neck pain.  Diagnoses and all orders for this visit:  Mild episode of recurrent major depressive disorder (HCC) -     ARIPiprazole (ABILIFY) 2 MG tablet; Take 1 tablet (2 mg total) by mouth daily.  Balance problems  Dizziness  Anxiety state  Mood changes -     ARIPiprazole (ABILIFY) 2 MG tablet; Take 1 tablet (2 mg total) by mouth daily.  Myofascial pain syndrome -     ketorolac (TORADOL) 10 MG tablet; Take one tablet as needed for moderate to severe pain up to once a day.  Chronic pain syndrome -     ketorolac (TORADOL) 10 MG tablet; Take one tablet as needed for moderate to severe pain up to once a day.  S/P cervical spinal fusion -     ketorolac (TORADOL) 10 MG tablet; Take one tablet as needed for moderate to severe pain up to once a day.  Other orders -     triamcinolone cream (KENALOG) 0.1 %; Apply 1 application topically 2 (two) times daily.  orthostatic BP normal. Negative for dix hallpike. Balance could be medications continue to work with pain clinic on lyrica dosing. toradol helps significantly. Sent to pharmacy to use as needed with NO other NSAIDs. Make sure to schedule with eye doctor.no red flags with running into tables.    Added Abilify to medications to help more with mood.   Laceration of pinky fingers and stiches come next week to have removed. Appears to be healing well.   Follow up in 6 weeks.

## 2019-08-22 ENCOUNTER — Other Ambulatory Visit: Payer: Self-pay | Admitting: Physician Assistant

## 2019-08-22 DIAGNOSIS — G2581 Restless legs syndrome: Secondary | ICD-10-CM

## 2019-08-25 DIAGNOSIS — R4586 Emotional lability: Secondary | ICD-10-CM | POA: Insufficient documentation

## 2019-08-27 ENCOUNTER — Other Ambulatory Visit: Payer: Self-pay | Admitting: Physician Assistant

## 2019-08-27 ENCOUNTER — Ambulatory Visit: Payer: Medicare Other | Admitting: Medical-Surgical

## 2019-08-27 DIAGNOSIS — T887XXA Unspecified adverse effect of drug or medicament, initial encounter: Secondary | ICD-10-CM

## 2019-08-27 DIAGNOSIS — M1711 Unilateral primary osteoarthritis, right knee: Secondary | ICD-10-CM | POA: Diagnosis not present

## 2019-08-27 DIAGNOSIS — M7051 Other bursitis of knee, right knee: Secondary | ICD-10-CM | POA: Diagnosis not present

## 2019-08-28 ENCOUNTER — Other Ambulatory Visit: Payer: Self-pay

## 2019-08-28 ENCOUNTER — Ambulatory Visit (INDEPENDENT_AMBULATORY_CARE_PROVIDER_SITE_OTHER): Payer: Medicare Other | Admitting: Medical-Surgical

## 2019-08-28 VITALS — BP 112/61 | HR 72

## 2019-08-28 DIAGNOSIS — S61218D Laceration without foreign body of other finger without damage to nail, subsequent encounter: Secondary | ICD-10-CM

## 2019-08-28 NOTE — Progress Notes (Signed)
Patient had laceration of pinky fingers last week and was supposed to follow up for stitch removal this week. Patient missed appointment yesterday and comes in today with sutures having popped out at home. Appears to be healing well, no signs of infection. She has some separation on her left pinky finger and steri-strips were placed. Area on right pinky still has some fullness at knuckle, she states it looks the same as it did prior to her procedure at urgent care. She is seeing [facility] about her trigger finger next Friday and will address it with their office. Will return to clinic with any issues. Notes sent to [facility] per patient request.

## 2019-09-01 ENCOUNTER — Encounter: Payer: Self-pay | Admitting: Family Medicine

## 2019-09-02 ENCOUNTER — Ambulatory Visit: Payer: Medicare Other | Admitting: Family Medicine

## 2019-09-02 NOTE — Telephone Encounter (Signed)
Appointment has been canceled. No further questions at this time.  

## 2019-09-03 ENCOUNTER — Ambulatory Visit (INDEPENDENT_AMBULATORY_CARE_PROVIDER_SITE_OTHER): Payer: Medicare Other | Admitting: Sports Medicine

## 2019-09-03 ENCOUNTER — Other Ambulatory Visit: Payer: Self-pay

## 2019-09-03 DIAGNOSIS — M7051 Other bursitis of knee, right knee: Secondary | ICD-10-CM | POA: Diagnosis not present

## 2019-09-03 DIAGNOSIS — M7050 Other bursitis of knee, unspecified knee: Secondary | ICD-10-CM

## 2019-09-03 DIAGNOSIS — L989 Disorder of the skin and subcutaneous tissue, unspecified: Secondary | ICD-10-CM | POA: Diagnosis not present

## 2019-09-03 MED ORDER — TRIAMCINOLONE ACETONIDE 0.5 % EX CREA: 1.0000 "application " | TOPICAL_CREAM | Freq: Two times a day (BID) | CUTANEOUS | 3 refills | Status: DC

## 2019-09-03 NOTE — Assessment & Plan Note (Signed)
Unclear etiology, looks like it may have been a small wart, now is a tender nodule. Adding topical triamcinolone. I would like her to follow-up with her PCP for this before considering any form of surgical intervention or excision.

## 2019-09-03 NOTE — Assessment & Plan Note (Signed)
[name] has had several weeks of severe worsening pain medially distal to the joint line on her knee consistent with pes anserine bursitis. Today we injected this. Return to see me in a month.

## 2019-09-03 NOTE — Progress Notes (Signed)
    Procedures performed today:    None.  Independent interpretation of notes and tests performed by another provider:   Procedure:  Injection of right pes anserine bursa Consent obtained and verified. Time-out conducted. Noted no overlying erythema, induration, or other signs of local infection. Skin prepped in a sterile fashion. Topical analgesic spray: Ethyl chloride. Completed without difficulty. Meds: 1 cc Kenalog 40, 1 cc lidocaine injected easily in a fanlike pattern Pain immediately improved suggesting accurate placement of the medication. Advised to call if fevers/chills, erythema, induration, drainage, or persistent bleeding.  Brief History, Exam, Impression, and Recommendations:    Pes anserine bursitis [name] has had several weeks of severe worsening pain medially distal to the joint line on her knee consistent with pes anserine bursitis. Today we injected this. Return to see me in a month.  Nodule right fifth PIP of hand Unclear etiology, looks like it may have been a small wart, now is a tender nodule. Adding topical triamcinolone. I would like her to follow-up with her PCP for this before considering any form of surgical intervention or excision.    ___________________________________________ Gwen Her. Dianah Field, M.D., ABFM., CAQSM. Primary Care and [place] Instructor of [place] [employer] [facility] [employer]

## 2019-09-05 ENCOUNTER — Other Ambulatory Visit: Payer: Self-pay | Admitting: Physician Assistant

## 2019-09-05 DIAGNOSIS — T887XXA Unspecified adverse effect of drug or medicament, initial encounter: Secondary | ICD-10-CM

## 2019-09-06 DIAGNOSIS — M79642 Pain in left hand: Secondary | ICD-10-CM | POA: Diagnosis not present

## 2019-09-06 DIAGNOSIS — M79641 Pain in right hand: Secondary | ICD-10-CM | POA: Diagnosis not present

## 2019-09-06 DIAGNOSIS — M6530 Trigger finger, unspecified finger: Secondary | ICD-10-CM | POA: Diagnosis not present

## 2019-09-07 ENCOUNTER — Other Ambulatory Visit: Payer: Self-pay | Admitting: Physician Assistant

## 2019-09-07 DIAGNOSIS — G43009 Migraine without aura, not intractable, without status migrainosus: Secondary | ICD-10-CM

## 2019-09-11 ENCOUNTER — Other Ambulatory Visit: Payer: Self-pay | Admitting: Physician Assistant

## 2019-09-11 DIAGNOSIS — R4586 Emotional lability: Secondary | ICD-10-CM

## 2019-09-11 DIAGNOSIS — F330 Major depressive disorder, recurrent, mild: Secondary | ICD-10-CM

## 2019-09-13 ENCOUNTER — Other Ambulatory Visit: Payer: Self-pay | Admitting: Physician Assistant

## 2019-09-13 DIAGNOSIS — G4700 Insomnia, unspecified: Secondary | ICD-10-CM | POA: Diagnosis not present

## 2019-09-13 DIAGNOSIS — R0683 Snoring: Secondary | ICD-10-CM | POA: Diagnosis not present

## 2019-09-13 DIAGNOSIS — G4710 Hypersomnia, unspecified: Secondary | ICD-10-CM | POA: Diagnosis not present

## 2019-09-13 DIAGNOSIS — F411 Generalized anxiety disorder: Secondary | ICD-10-CM

## 2019-09-15 DIAGNOSIS — Z03818 Encounter for observation for suspected exposure to other biological agents ruled out: Secondary | ICD-10-CM | POA: Diagnosis not present

## 2019-09-15 DIAGNOSIS — Z20828 Contact with and (suspected) exposure to other viral communicable diseases: Secondary | ICD-10-CM | POA: Diagnosis not present

## 2019-09-17 ENCOUNTER — Telehealth (INDEPENDENT_AMBULATORY_CARE_PROVIDER_SITE_OTHER): Payer: Medicare Other | Admitting: Physician Assistant

## 2019-09-17 ENCOUNTER — Encounter: Payer: Self-pay | Admitting: Physician Assistant

## 2019-09-17 VITALS — Temp 97.7°F | Ht 63.0 in | Wt 150.0 lb

## 2019-09-17 DIAGNOSIS — T753XXS Motion sickness, sequela: Secondary | ICD-10-CM

## 2019-09-17 DIAGNOSIS — J40 Bronchitis, not specified as acute or chronic: Secondary | ICD-10-CM

## 2019-09-17 DIAGNOSIS — J329 Chronic sinusitis, unspecified: Secondary | ICD-10-CM | POA: Diagnosis not present

## 2019-09-17 MED ORDER — SCOPOLAMINE 1 MG/3DAYS TD PT72: 1.0000 | MEDICATED_PATCH | TRANSDERMAL | 0 refills | Status: DC

## 2019-09-17 MED ORDER — AZITHROMYCIN 250 MG PO TABS: ORAL_TABLET | ORAL | 0 refills | Status: DC

## 2019-09-17 MED ORDER — HYDROCOD POLST-CPM POLST ER 10-8 MG/5ML PO SUER: 5.0000 mL | Freq: Two times a day (BID) | ORAL | 0 refills | Status: DC | PRN

## 2019-09-17 NOTE — Progress Notes (Signed)
Started last Saturday: Covid test negative - Sunday (leaving to go to [place]) Cough - worse at night/ causing pain in neck Sinus drainage/pressure Feels like going into her chest now  Has tried mucinex/nasal spray OTC - helping a little bit

## 2019-09-17 NOTE — Progress Notes (Signed)
Patient ID: [name], female   DOB: December 06, 1975, 44 y.o.   MRN: [id number] .Marland KitchenVirtual Visit via Video Note  I connected with [name] on 09/17/19 at 11:10 AM EDT by a video enabled telemedicine application and verified that I am speaking with the correct person using two identifiers.  Location: Patient: home Provider: home   I discussed the limitations of evaluation and management by telemedicine and the availability of in person appointments. The patient expressed understanding and agreed to proceed.  History of Present Illness: Pt is a 44 yo female who calls into to the clinic with sinus pressure, congestion, cough, chest tightness, headache for the last week. Sunday she got covid tested and was negative. She feels like going into her chest now. No fever, chills, body aches. She has tried nasonnex, mucinex, tylenol, sudafed with little relief.   She leaves on a plane to [place] today. She would like motion sickness patches.  .. Active Ambulatory Problems    Diagnosis Date Noted  . Anxiety state 07/06/2007  . Smoker 02/05/2010  . Depressed 06/26/2008  . INTERSTITIAL CYSTITIS 02/05/2010  . Insomnia 07/06/2007  . FATIGUE 07/02/2009  . HEADACHE 07/06/2007  . TROCHANTERIC BURSITIS 06/21/2010  . Tick-borne disease 08/09/2010  . PTSD (post-traumatic stress disorder) 12/14/2012  . Weight gain 11/29/2013  . Greater trochanteric bursitis 11/29/2013  . Constipation due to opioid therapy 01/17/2014  . Left shoulder pain 04/02/2014  . Carpal tunnel syndrome of left wrist 05/05/2014  . Myofascial pain syndrome 05/29/2014  . Paresthesia 06/03/2014  . Endometriosis 07/20/2014  . Tachycardia 08/13/2014  . S/P cervical spinal fusion 10/28/2014  . Therapeutic opioid induced constipation 12/16/2014  . Vasomotor flushing 06/15/2015  . GAD (generalized anxiety disorder) 06/15/2015  . Fixed pupils 08/31/2015  . Dysuria 08/31/2015  . Depression 08/31/2015  . Primary  osteoarthritis of left first metacarpophalangeal joint 04/04/2016  . Palpitations 04/14/2016  . Balance problems 07/19/2016  . Dizziness 07/19/2016  . Primary osteoarthritis of right knee 09/05/2016  . Trigger thumb, left thumb 10/06/2016  . Chronic pain syndrome 01/08/2017  . 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  . First degree ankle sprain, left, initial encounter 08/29/2017  . Hypertension 10/06/2017  . Hx of cold sores 10/06/2017  . Asthma 01/29/2018  . Chronic neck pain 01/29/2018  . Traumatic ecchymosis of left foot 10/15/2018  . Mild episode of recurrent major depressive disorder ([place]) 10/30/2018  . Dog bite 02/15/2019  . Leukocytes in urine 03/04/2019  . Other microscopic hematuria 03/04/2019  . Left ovarian cyst 03/04/2019  . Cervical post-laminectomy syndrome 07/08/2019  . RLS (restless legs syndrome) 07/08/2019  . Mood changes 08/25/2019  . Pes anserine bursitis 09/03/2019  . Nodule right fifth PIP of hand 09/03/2019   Resolved Ambulatory Problems    Diagnosis Date Noted  . RHINITIS 10/13/2009  . Polyuria 11/04/2010  . Degenerative disc disease, cervical 10/31/2013  . Cervical disc disorder with radiculopathy of cervical region 11/29/2013  . Itching 11/29/2013  . Radiculitis of left cervical region 01/17/2014  . Nausea with vomiting 11/25/2014  . Dysmenorrhea 05/11/2015  . Cervical disc disorder with radiculopathy of cervical region 11/11/2015  . Acute upper respiratory infection 04/04/2016  . Localized primary carpometacarpal osteoarthritis, left 04/04/2016   Past Medical History:  Diagnosis Date  . Anxiety   . Complication of anesthesia   . Dysrhythmia   . Elevated heart rate and blood pressure   . Family history of adverse reaction to anesthesia   . Fibromyalgia   . IC (  interstitial cystitis)   . Myofascial pain syndrome, cervical   . Neck pain   . Ovarian cyst   . PONV (postoperative nausea and vomiting)    Reviewed med, allergy, problem list.    Observations/Objective: No acute distress Normal breathing. No wheezing. Dry hacking cough.  Normal mood and appearance.   .. Today's Vitals   09/17/19 1049  Temp: 97.7 F (36.5 C)  TempSrc: Oral  Weight: 150 lb (68 kg)  Height: 5\' 3"  (1.6 m)   Body mass index is 26.57 kg/m.    Assessment and Plan: .Marland KitchenLacandice was seen today for sinus problem.  Diagnoses and all orders for this visit:  Sinobronchitis -     azithromycin (ZITHROMAX) 250 MG tablet; Take 2 tablets now and then one tablet for 4 days. -     chlorpheniramine-HYDROcodone ([place]) 10-8 MG/5ML SUER; Take 5 mLs by mouth every 12 (twelve) hours as needed.  Motion sickness, sequela -     scopolamine (TRANSDERM-SCOP, 1.5 MG,) 1 MG/3DAYS; Place 1 patch (1.5 mg total) onto the skin every 3 (three) days.   One week of symptoms. Treated for sinobronchitis with zpak and cough syrup. Rest and hydrate. Continue flonase/nasonex.   Scopolamine patches given for motion sickness.    Follow Up Instructions:    I discussed the assessment and treatment plan with the patient. The patient was provided an opportunity to ask questions and all were answered. The patient agreed with the plan and demonstrated an understanding of the instructions.   The patient was advised to call back or seek an in-person evaluation if the symptoms worsen or if the condition fails to improve as anticipated.  I provided 11 minutes of non-face-to-face time during this encounter.   Iran Planas, PA-C

## 2019-09-18 ENCOUNTER — Other Ambulatory Visit: Payer: Self-pay | Admitting: Physician Assistant

## 2019-09-18 DIAGNOSIS — G2581 Restless legs syndrome: Secondary | ICD-10-CM

## 2019-09-23 ENCOUNTER — Encounter: Payer: Self-pay | Admitting: Physician Assistant

## 2019-09-23 ENCOUNTER — Telehealth: Payer: Self-pay | Admitting: Physician Assistant

## 2019-09-23 DIAGNOSIS — J329 Chronic sinusitis, unspecified: Secondary | ICD-10-CM

## 2019-09-23 DIAGNOSIS — J40 Bronchitis, not specified as acute or chronic: Secondary | ICD-10-CM

## 2019-09-23 MED ORDER — METHYLPREDNISOLONE 4 MG PO TBPK
ORAL_TABLET | ORAL | 0 refills | Status: DC
Start: 2019-09-23 — End: 2019-11-20

## 2019-09-23 MED ORDER — HYDROCOD POLST-CPM POLST ER 10-8 MG/5ML PO SUER: 5.0000 mL | Freq: Two times a day (BID) | ORAL | 0 refills | Status: DC | PRN

## 2019-09-23 NOTE — Telephone Encounter (Signed)
[name] called this morning asking if she get a refill on the tussionex. She said the z-pac has not helped her, but the tussionex was helping her sleep at night. She is not feeling any better and now she is losing her voice. Pharmacy on file is correct.

## 2019-09-23 NOTE — Telephone Encounter (Signed)
Patient advised by [employer] via [place]

## 2019-09-23 NOTE — Telephone Encounter (Signed)
I will send  tussionex and methylprednisolone it is a NOT exactly the same and prednisone. I am hoping you will tolerate better.

## 2019-09-25 ENCOUNTER — Other Ambulatory Visit: Payer: Self-pay | Admitting: Physician Assistant

## 2019-09-25 DIAGNOSIS — G4710 Hypersomnia, unspecified: Secondary | ICD-10-CM | POA: Diagnosis not present

## 2019-09-25 DIAGNOSIS — R0683 Snoring: Secondary | ICD-10-CM | POA: Diagnosis not present

## 2019-09-25 DIAGNOSIS — T887XXA Unspecified adverse effect of drug or medicament, initial encounter: Secondary | ICD-10-CM

## 2019-10-01 ENCOUNTER — Ambulatory Visit: Payer: Medicare Other | Admitting: Sports Medicine

## 2019-10-02 ENCOUNTER — Other Ambulatory Visit: Payer: Self-pay | Admitting: Physician Assistant

## 2019-10-02 DIAGNOSIS — G43009 Migraine without aura, not intractable, without status migrainosus: Secondary | ICD-10-CM

## 2019-10-07 DIAGNOSIS — H5213 Myopia, bilateral: Secondary | ICD-10-CM | POA: Diagnosis not present

## 2019-10-07 DIAGNOSIS — H40013 Open angle with borderline findings, low risk, bilateral: Secondary | ICD-10-CM | POA: Diagnosis not present

## 2019-10-07 DIAGNOSIS — H52222 Regular astigmatism, left eye: Secondary | ICD-10-CM | POA: Diagnosis not present

## 2019-10-08 ENCOUNTER — Ambulatory Visit: Payer: Medicare Other | Admitting: Physician Assistant

## 2019-10-16 ENCOUNTER — Other Ambulatory Visit: Payer: Self-pay | Admitting: Neurology

## 2019-10-16 DIAGNOSIS — G2581 Restless legs syndrome: Secondary | ICD-10-CM

## 2019-10-16 MED ORDER — ROPINIROLE HCL 0.25 MG PO TABS
0.2500 mg | ORAL_TABLET | Freq: Every day | ORAL | 1 refills | Status: DC
Start: 2019-10-16 — End: 2019-12-09

## 2019-10-17 ENCOUNTER — Other Ambulatory Visit: Payer: Self-pay | Admitting: Physician Assistant

## 2019-10-29 DIAGNOSIS — M961 Postlaminectomy syndrome, not elsewhere classified: Secondary | ICD-10-CM | POA: Diagnosis not present

## 2019-10-29 DIAGNOSIS — M5412 Radiculopathy, cervical region: Secondary | ICD-10-CM | POA: Diagnosis not present

## 2019-10-29 DIAGNOSIS — G894 Chronic pain syndrome: Secondary | ICD-10-CM | POA: Diagnosis not present

## 2019-10-30 ENCOUNTER — Other Ambulatory Visit: Payer: Self-pay | Admitting: Physician Assistant

## 2019-10-30 DIAGNOSIS — G43009 Migraine without aura, not intractable, without status migrainosus: Secondary | ICD-10-CM

## 2019-11-08 ENCOUNTER — Other Ambulatory Visit: Payer: Self-pay | Admitting: Physician Assistant

## 2019-11-08 DIAGNOSIS — G2581 Restless legs syndrome: Secondary | ICD-10-CM

## 2019-11-11 DIAGNOSIS — G894 Chronic pain syndrome: Secondary | ICD-10-CM | POA: Diagnosis not present

## 2019-11-11 DIAGNOSIS — M5412 Radiculopathy, cervical region: Secondary | ICD-10-CM | POA: Diagnosis not present

## 2019-11-11 DIAGNOSIS — F4310 Post-traumatic stress disorder, unspecified: Secondary | ICD-10-CM | POA: Diagnosis not present

## 2019-11-11 DIAGNOSIS — F419 Anxiety disorder, unspecified: Secondary | ICD-10-CM | POA: Diagnosis not present

## 2019-11-13 ENCOUNTER — Other Ambulatory Visit: Payer: Self-pay | Admitting: Physician Assistant

## 2019-11-13 IMAGING — MR MR LUMBAR SPINE W/O CM
4 of 5 series · 26 of 48 positions shown · non-contrast
Comparison: None.

CLINICAL DATA: Low back pain, leg weakness, several falls

EXAM:
MRI LUMBAR SPINE WITHOUT CONTRAST
TECHNIQUE: Multiplanar, multisequence MR imaging of the lumbar spine was
performed. No intravenous contrast was administered.

[Series 2: T2 · sagittal · 4.0mm · 0.81mm/px · 6 of 15 slices shown (1 of 2)]
[im 1/15]
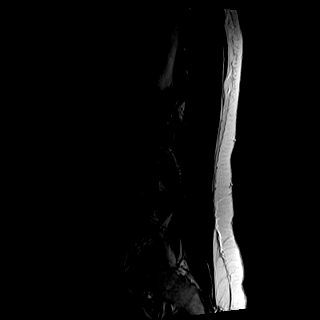
[im 3/15]
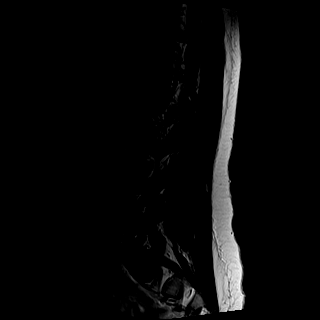
[im 6/15]
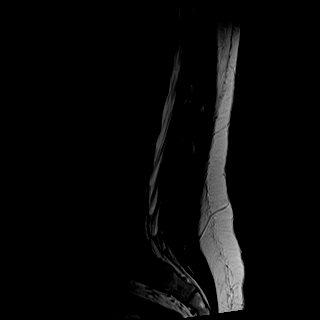
[im 9/15]
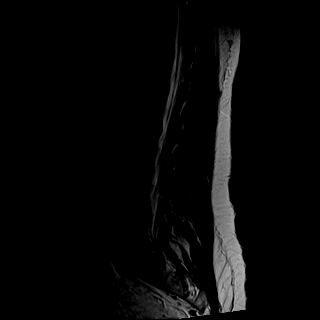
[im 12/15]
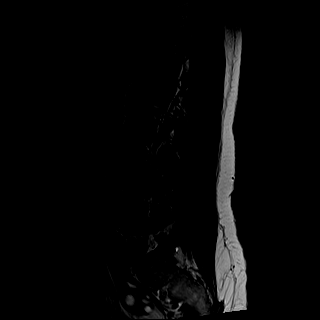
[im 15/15]
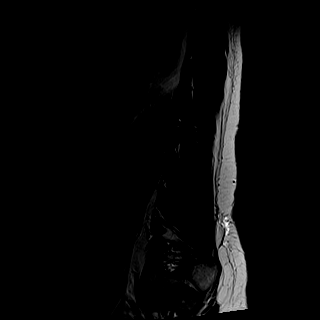

[Series 3: T1 · sagittal · 4.0mm · 0.41mm/px · 6 of 15 slices shown (1 of 2)]
[im 1/15]
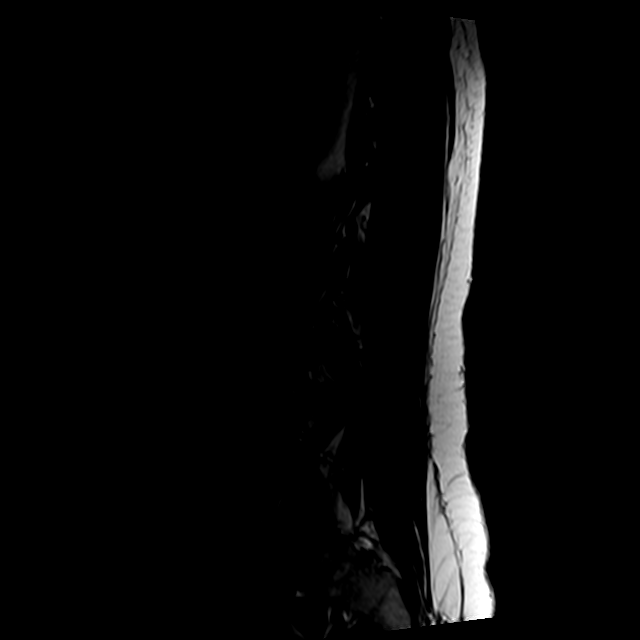
[im 3/15]
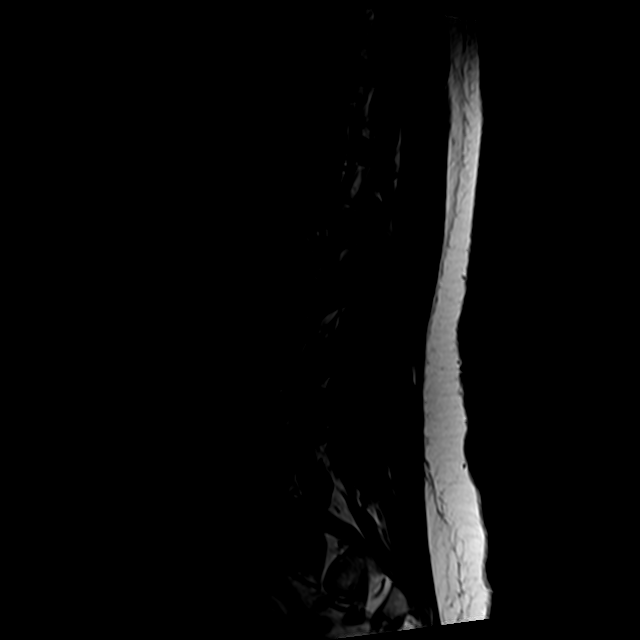
[im 6/15]
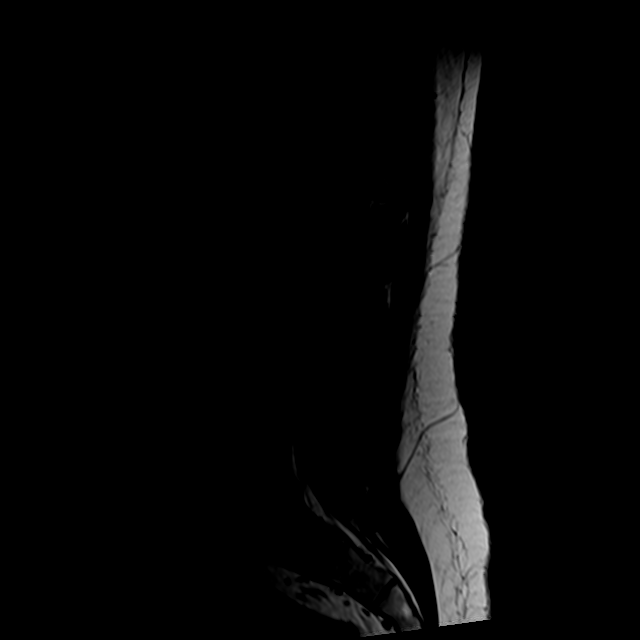
[im 9/15]
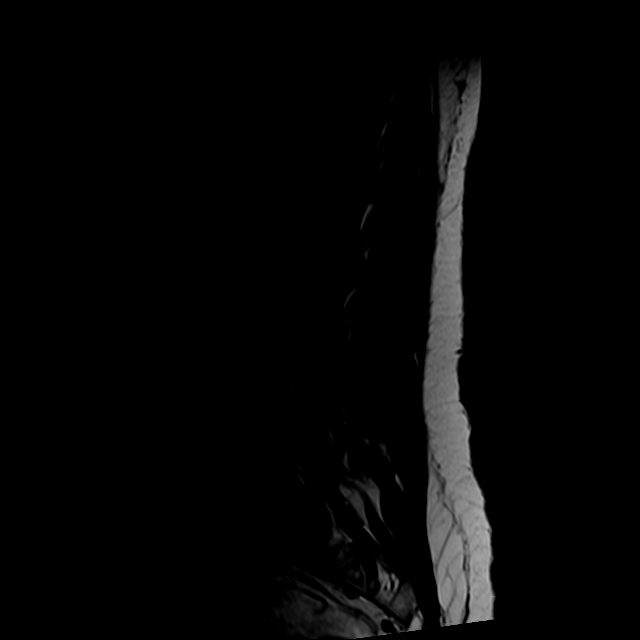
[im 12/15]
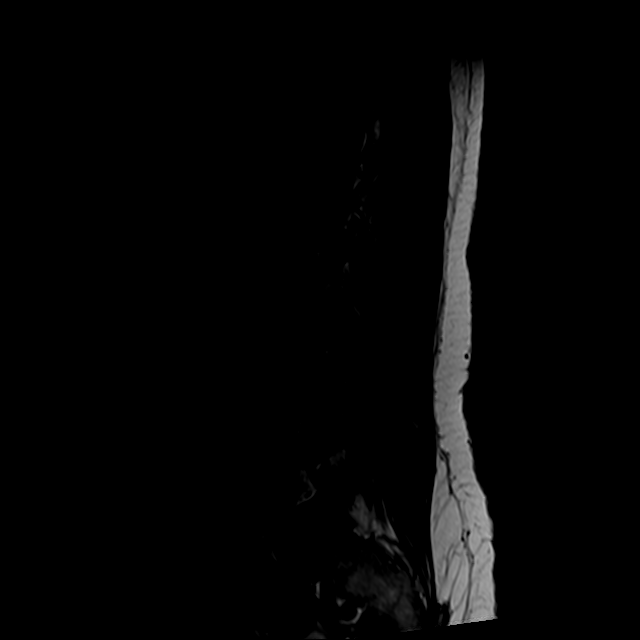
[im 15/15]
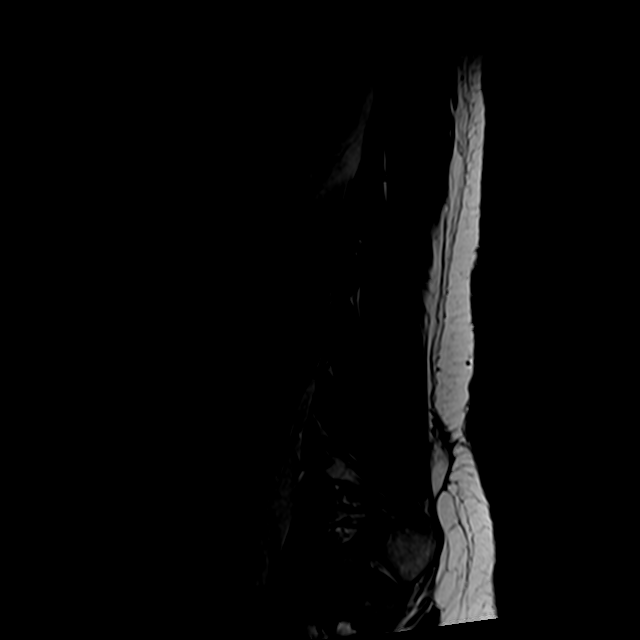

[Series 5: T2 · axial · 4.0mm · 0.78mm/px · z∈[-29,+168]mm · 9 of 36 slices shown (2 of 2)]
[im 1/36]
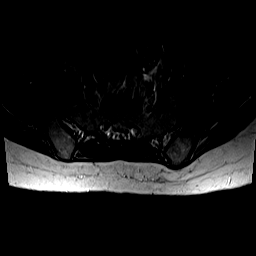
[im 6/36]
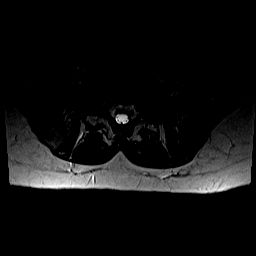
[im 11/36]
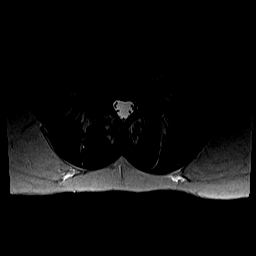
[im 16/36]
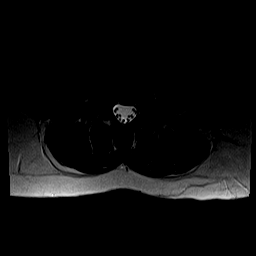
[im 18/36]
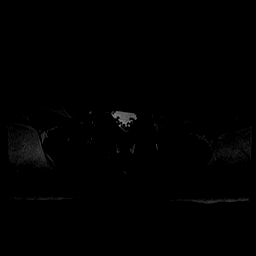
[im 21/36]
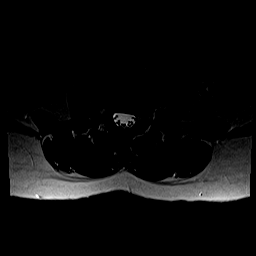
[im 26/36]
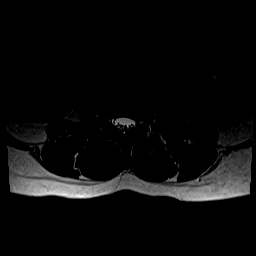
[im 31/36]
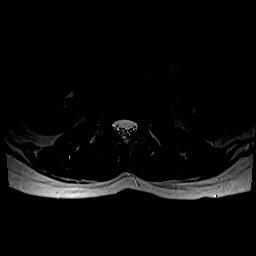
[im 36/36]
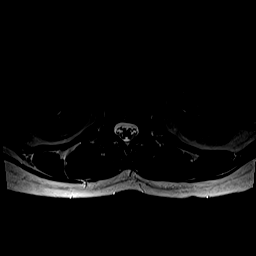

[Series 6: T1 · axial · 4.0mm · 0.39mm/px · z∈[-29,+143]mm · 5 of 36 slices shown (2 of 2)]
[im 1/36]
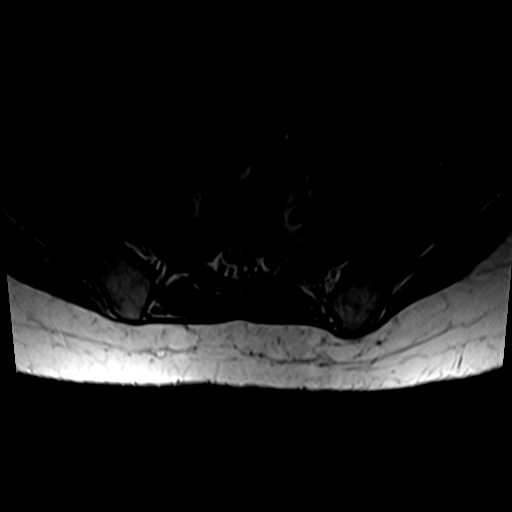
[im 6/36]
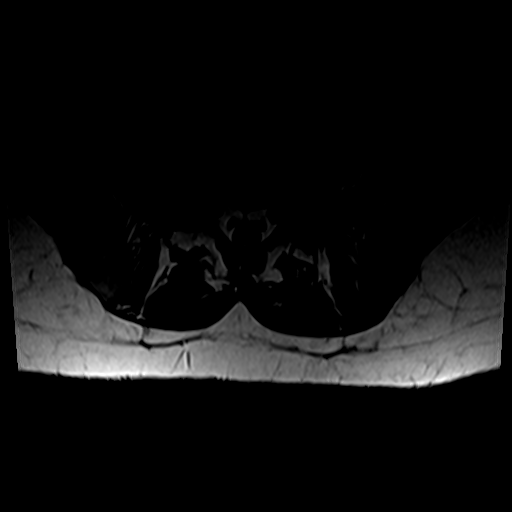
[im 11/36]
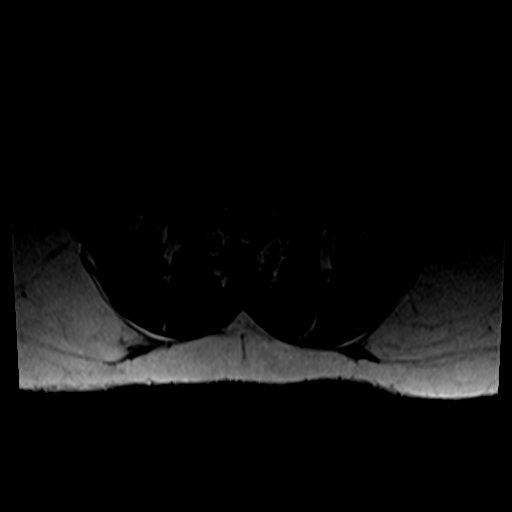
[im 18/36]
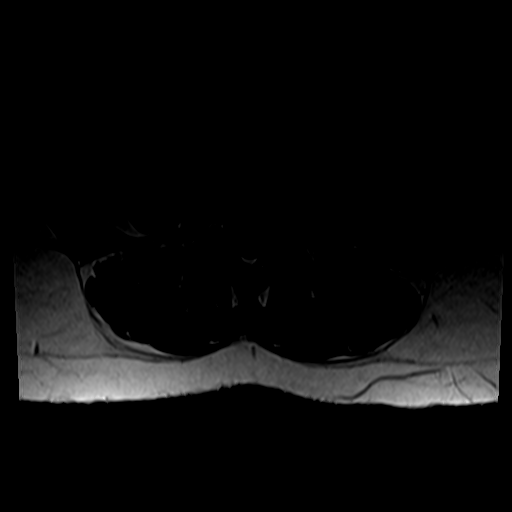
[im 31/36]
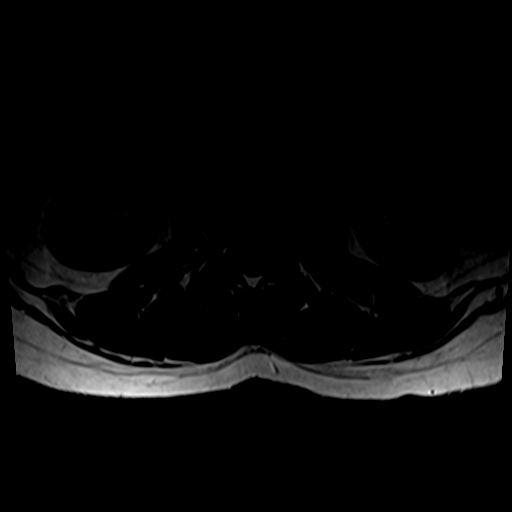

[26 of 48 positions shown; findings below may reference images not displayed]

FINDINGS: Segmentation:  Standard.

Alignment:  Physiologic.

Vertebrae:  No fracture, evidence of discitis, or bone lesion.

Conus medullaris and cauda equina: Conus extends to the L1 level.
Conus and cauda equina appear normal.

Paraspinal and other soft tissues: No acute paraspinal abnormality.

Disc levels:

Disc spaces: Disc spaces are relatively well maintained.

T12-L1: No significant disc protrusion. No evidence of neural
foraminal stenosis. No central canal stenosis.

L1-L2: No significant disc protrusion. No evidence of neural
foraminal stenosis. No central canal stenosis.

L2-L3: No significant disc protrusion. No evidence of neural
foraminal stenosis. No central canal stenosis.

L3-L4: No significant disc protrusion. No evidence of neural
foraminal stenosis. No central canal stenosis.

L4-L5: No significant disc protrusion. No evidence of neural
foraminal stenosis. No central canal stenosis.

L5-S1: No significant disc protrusion. No evidence of neural
foraminal stenosis. No central canal stenosis.
IMPRESSION: 1. No significant lumbar spine disc protrusion, foraminal stenosis
or central canal stenosis. No evidence of nerve root impingement.
2.  No acute osseous injury of the lumbar spine.

## 2019-11-15 ENCOUNTER — Other Ambulatory Visit: Payer: Self-pay | Admitting: Sports Medicine

## 2019-11-15 DIAGNOSIS — L989 Disorder of the skin and subcutaneous tissue, unspecified: Secondary | ICD-10-CM

## 2019-11-18 DIAGNOSIS — Z981 Arthrodesis status: Secondary | ICD-10-CM | POA: Diagnosis not present

## 2019-11-18 DIAGNOSIS — M4722 Other spondylosis with radiculopathy, cervical region: Secondary | ICD-10-CM | POA: Diagnosis not present

## 2019-11-18 DIAGNOSIS — M5412 Radiculopathy, cervical region: Secondary | ICD-10-CM | POA: Diagnosis not present

## 2019-11-20 ENCOUNTER — Ambulatory Visit (INDEPENDENT_AMBULATORY_CARE_PROVIDER_SITE_OTHER): Payer: Medicare Other | Admitting: Physician Assistant

## 2019-11-20 VITALS — BP 109/56 | HR 100 | Ht 63.0 in | Wt 145.0 lb

## 2019-11-20 DIAGNOSIS — G8929 Other chronic pain: Secondary | ICD-10-CM | POA: Diagnosis not present

## 2019-11-20 DIAGNOSIS — L237 Allergic contact dermatitis due to plants, except food: Secondary | ICD-10-CM | POA: Diagnosis not present

## 2019-11-20 DIAGNOSIS — G894 Chronic pain syndrome: Secondary | ICD-10-CM | POA: Diagnosis not present

## 2019-11-20 DIAGNOSIS — M961 Postlaminectomy syndrome, not elsewhere classified: Secondary | ICD-10-CM | POA: Diagnosis not present

## 2019-11-20 DIAGNOSIS — K219 Gastro-esophageal reflux disease without esophagitis: Secondary | ICD-10-CM

## 2019-11-20 DIAGNOSIS — M542 Cervicalgia: Secondary | ICD-10-CM | POA: Diagnosis not present

## 2019-11-20 MED ORDER — CLOBETASOL PROPIONATE 0.05 % EX CREA: 1.0000 "application " | TOPICAL_CREAM | Freq: Two times a day (BID) | CUTANEOUS | 0 refills | Status: DC

## 2019-11-20 MED ORDER — METHYLPREDNISOLONE SODIUM SUCC 125 MG IJ SOLR
125.0000 mg | Freq: Once | INTRAMUSCULAR | Status: AC
  Administered 2019-11-20: 125 mg via INTRAMUSCULAR

## 2019-11-20 MED ORDER — METHYLPREDNISOLONE ACETATE 40 MG/ML IJ SUSP
40.0000 mg | Freq: Once | INTRAMUSCULAR | Status: AC
  Administered 2019-11-20: 40 mg via INTRAMUSCULAR

## 2019-11-20 MED ORDER — PANTOPRAZOLE SODIUM 40 MG PO TBEC: 40.0000 mg | DELAYED_RELEASE_TABLET | Freq: Every day | ORAL | 3 refills | Status: DC

## 2019-11-20 NOTE — Progress Notes (Signed)
Subjective:    Patient ID: [name], female    DOB: 07/02/1975, 44 y.o.   MRN: [id number]  HPI  Patient is a 44 year old female with chronic neck pain who presents to the clinic with rash and itching.  Patient has been outside a lot over the last week or so.  She has developed a vesicular itchy rash.  Is not resolving with her topical triamcinolone cream.  She has a history of poison ivy dermatitis.  Patient continues to have daily neck pain.  She has had laminectomy and continues to have pain.  Neurology says she will need to have another surgery.  She is very concerned about the surgery.  She does not feel like she can do it right now.  She goes to [facility] pain clinic.  She does not feel like they are fully addressing her pain.  She is not on any narcotics.  She is on Lyrica and a muscle relaxer as needed.  Lyrica at higher doses is making her very sleepy.  She has tolerated oxycodone in the past but is unsure why they will not give her this in a maintenance dose to help keep her pain-free.  She would like another referral.   .. Active Ambulatory Problems    Diagnosis Date Noted  . Anxiety state 07/06/2007  . Smoker 02/05/2010  . Depressed 06/26/2008  . INTERSTITIAL CYSTITIS 02/05/2010  . Insomnia 07/06/2007  . FATIGUE 07/02/2009  . HEADACHE 07/06/2007  . TROCHANTERIC BURSITIS 06/21/2010  . Tick-borne disease 08/09/2010  . PTSD (post-traumatic stress disorder) 12/14/2012  . Weight gain 11/29/2013  . Greater trochanteric bursitis 11/29/2013  . Constipation due to opioid therapy 01/17/2014  . Left shoulder pain 04/02/2014  . Carpal tunnel syndrome of left wrist 05/05/2014  . Myofascial pain syndrome 05/29/2014  . Paresthesia 06/03/2014  . Endometriosis 07/20/2014  . Tachycardia 08/13/2014  . S/P cervical spinal fusion 10/28/2014  . Therapeutic opioid induced constipation 12/16/2014  . Vasomotor flushing 06/15/2015  . GAD (generalized anxiety disorder)  06/15/2015  . Fixed pupils 08/31/2015  . Dysuria 08/31/2015  . Depression 08/31/2015  . Primary osteoarthritis of left first metacarpophalangeal joint 04/04/2016  . Palpitations 04/14/2016  . Balance problems 07/19/2016  . Dizziness 07/19/2016  . Primary osteoarthritis of right knee 09/05/2016  . Trigger thumb, left thumb 10/06/2016  . Chronic pain syndrome 01/08/2017  . 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  . First degree ankle sprain, left, initial encounter 08/29/2017  . Hypertension 10/06/2017  . Hx of cold sores 10/06/2017  . Asthma 01/29/2018  . Chronic neck pain 01/29/2018  . Traumatic ecchymosis of left foot 10/15/2018  . Mild episode of recurrent major depressive disorder ([place]) 10/30/2018  . Dog bite 02/15/2019  . Leukocytes in urine 03/04/2019  . Other microscopic hematuria 03/04/2019  . Left ovarian cyst 03/04/2019  . Cervical post-laminectomy syndrome 07/08/2019  . RLS (restless legs syndrome) 07/08/2019  . Mood changes 08/25/2019  . Pes anserine bursitis 09/03/2019  . Nodule right fifth PIP of hand 09/03/2019   Resolved Ambulatory Problems    Diagnosis Date Noted  . RHINITIS 10/13/2009  . Polyuria 11/04/2010  . Degenerative disc disease, cervical 10/31/2013  . Cervical disc disorder with radiculopathy of cervical region 11/29/2013  . Itching 11/29/2013  . Radiculitis of left cervical region 01/17/2014  . Nausea with vomiting 11/25/2014  . Dysmenorrhea 05/11/2015  . Cervical disc disorder with radiculopathy of cervical region 11/11/2015  . Acute upper respiratory infection 04/04/2016  . Localized primary carpometacarpal osteoarthritis,  left 04/04/2016   Past Medical History:  Diagnosis Date  . Anxiety   . Complication of anesthesia   . Dysrhythmia   . Elevated heart rate and blood pressure   . Family history of adverse reaction to anesthesia   . Fibromyalgia   . IC (interstitial cystitis)   . Myofascial pain syndrome, cervical   . Neck pain   . Ovarian cyst   .  PONV (postoperative nausea and vomiting)      Review of Systems See HPI.     Objective:   Physical Exam Vitals reviewed.  Cardiovascular:     Rate and Rhythm: Normal rate and regular rhythm.  Pulmonary:     Effort: Pulmonary effort is normal.  Skin:    Comments: Scattered linear vesicular rash over bilateral arms and legs.   Neurological:     General: No focal deficit present.     Mental Status: She is alert and oriented to person, place, and time.  Psychiatric:     Comments: Tearful.           Assessment & Plan:  .Marland KitchenJadah was seen today for rash.  Diagnoses and all orders for this visit:  Poison ivy dermatitis -     clobetasol cream (TEMOVATE) 0.05 %; Apply 1 application topically 2 (two) times daily. -     methylPREDNISolone sodium succinate (SOLU-MEDROL) 125 mg/2 mL injection 125 mg -     methylPREDNISolone acetate (DEPO-MEDROL) injection 40 mg  Chronic pain syndrome -     Ambulatory referral to Pain Clinic  Chronic neck pain -     Ambulatory referral to Pain Clinic  Cervical post-laminectomy syndrome -     Ambulatory referral to Pain Clinic  Gastroesophageal reflux disease, unspecified whether esophagitis present -     pantoprazole (PROTONIX) 40 MG tablet; Take 1 tablet (40 mg total) by mouth daily.   Solu-Medrol and Depo-Medrol given for poison ivy dermatitis.  She was given a slightly stronger topical steroid to use as needed.  She can use Benadryl for itching as well as cool compresses.  Protonix refilled at patient's request.  GERD symptoms are controlled.  Patient discussed not following her that her current pain clinic provider.  She would like to get a referral to go somewhere else.

## 2019-11-20 NOTE — Patient Instructions (Signed)
Zyrtec 10 mg at bedtime

## 2019-11-21 DIAGNOSIS — G4700 Insomnia, unspecified: Secondary | ICD-10-CM | POA: Diagnosis not present

## 2019-11-21 DIAGNOSIS — G4710 Hypersomnia, unspecified: Secondary | ICD-10-CM | POA: Diagnosis not present

## 2019-11-22 ENCOUNTER — Encounter: Payer: Self-pay | Admitting: Physician Assistant

## 2019-11-25 DIAGNOSIS — M542 Cervicalgia: Secondary | ICD-10-CM | POA: Diagnosis not present

## 2019-11-25 DIAGNOSIS — F419 Anxiety disorder, unspecified: Secondary | ICD-10-CM | POA: Diagnosis not present

## 2019-11-25 DIAGNOSIS — F4310 Post-traumatic stress disorder, unspecified: Secondary | ICD-10-CM | POA: Diagnosis not present

## 2019-11-25 DIAGNOSIS — G894 Chronic pain syndrome: Secondary | ICD-10-CM | POA: Diagnosis not present

## 2019-12-01 ENCOUNTER — Other Ambulatory Visit: Payer: Self-pay | Admitting: Physician Assistant

## 2019-12-01 DIAGNOSIS — F330 Major depressive disorder, recurrent, mild: Secondary | ICD-10-CM

## 2019-12-01 DIAGNOSIS — R4586 Emotional lability: Secondary | ICD-10-CM

## 2019-12-04 DIAGNOSIS — Z981 Arthrodesis status: Secondary | ICD-10-CM | POA: Diagnosis not present

## 2019-12-04 DIAGNOSIS — Z79899 Other long term (current) drug therapy: Secondary | ICD-10-CM | POA: Diagnosis not present

## 2019-12-04 DIAGNOSIS — M5412 Radiculopathy, cervical region: Secondary | ICD-10-CM | POA: Diagnosis not present

## 2019-12-04 DIAGNOSIS — Z87828 Personal history of other (healed) physical injury and trauma: Secondary | ICD-10-CM | POA: Diagnosis not present

## 2019-12-04 DIAGNOSIS — Z1159 Encounter for screening for other viral diseases: Secondary | ICD-10-CM | POA: Diagnosis not present

## 2019-12-05 ENCOUNTER — Other Ambulatory Visit: Payer: Self-pay | Admitting: Sports Medicine

## 2019-12-05 DIAGNOSIS — M1711 Unilateral primary osteoarthritis, right knee: Secondary | ICD-10-CM

## 2019-12-06 DIAGNOSIS — M4802 Spinal stenosis, cervical region: Secondary | ICD-10-CM | POA: Diagnosis not present

## 2019-12-06 DIAGNOSIS — G894 Chronic pain syndrome: Secondary | ICD-10-CM | POA: Diagnosis not present

## 2019-12-06 DIAGNOSIS — M5010 Cervical disc disorder with radiculopathy, unspecified cervical region: Secondary | ICD-10-CM | POA: Diagnosis not present

## 2019-12-09 ENCOUNTER — Other Ambulatory Visit: Payer: Self-pay | Admitting: Physician Assistant

## 2019-12-09 DIAGNOSIS — G2581 Restless legs syndrome: Secondary | ICD-10-CM

## 2019-12-10 DIAGNOSIS — Z6825 Body mass index (BMI) 25.0-25.9, adult: Secondary | ICD-10-CM | POA: Diagnosis not present

## 2019-12-10 DIAGNOSIS — I10 Essential (primary) hypertension: Secondary | ICD-10-CM | POA: Diagnosis not present

## 2019-12-10 DIAGNOSIS — M5412 Radiculopathy, cervical region: Secondary | ICD-10-CM | POA: Diagnosis not present

## 2019-12-16 DIAGNOSIS — J22 Unspecified acute lower respiratory infection: Secondary | ICD-10-CM | POA: Diagnosis not present

## 2019-12-16 DIAGNOSIS — M5412 Radiculopathy, cervical region: Secondary | ICD-10-CM | POA: Diagnosis not present

## 2019-12-16 DIAGNOSIS — Z79899 Other long term (current) drug therapy: Secondary | ICD-10-CM | POA: Diagnosis not present

## 2019-12-16 DIAGNOSIS — M25561 Pain in right knee: Secondary | ICD-10-CM | POA: Diagnosis not present

## 2019-12-25 ENCOUNTER — Ambulatory Visit: Payer: Medicare Other | Admitting: Sports Medicine

## 2019-12-26 DIAGNOSIS — M5412 Radiculopathy, cervical region: Secondary | ICD-10-CM | POA: Diagnosis not present

## 2019-12-26 DIAGNOSIS — Z79899 Other long term (current) drug therapy: Secondary | ICD-10-CM | POA: Diagnosis not present

## 2019-12-26 DIAGNOSIS — Z981 Arthrodesis status: Secondary | ICD-10-CM | POA: Diagnosis not present

## 2019-12-26 DIAGNOSIS — M1710 Unilateral primary osteoarthritis, unspecified knee: Secondary | ICD-10-CM | POA: Diagnosis not present

## 2019-12-26 DIAGNOSIS — Z1159 Encounter for screening for other viral diseases: Secondary | ICD-10-CM | POA: Diagnosis not present

## 2019-12-30 ENCOUNTER — Ambulatory Visit (INDEPENDENT_AMBULATORY_CARE_PROVIDER_SITE_OTHER): Payer: Medicare Other

## 2019-12-30 ENCOUNTER — Ambulatory Visit (INDEPENDENT_AMBULATORY_CARE_PROVIDER_SITE_OTHER): Payer: Medicare Other | Admitting: Sports Medicine

## 2019-12-30 ENCOUNTER — Other Ambulatory Visit: Payer: Self-pay

## 2019-12-30 ENCOUNTER — Ambulatory Visit: Payer: Medicare Other

## 2019-12-30 DIAGNOSIS — G894 Chronic pain syndrome: Secondary | ICD-10-CM

## 2019-12-30 DIAGNOSIS — M1711 Unilateral primary osteoarthritis, right knee: Secondary | ICD-10-CM

## 2019-12-30 DIAGNOSIS — R600 Localized edema: Secondary | ICD-10-CM | POA: Diagnosis not present

## 2019-12-30 DIAGNOSIS — M25512 Pain in left shoulder: Secondary | ICD-10-CM | POA: Diagnosis not present

## 2019-12-30 DIAGNOSIS — M7542 Impingement syndrome of left shoulder: Secondary | ICD-10-CM | POA: Diagnosis not present

## 2019-12-30 DIAGNOSIS — M25461 Effusion, right knee: Secondary | ICD-10-CM | POA: Diagnosis not present

## 2019-12-30 DIAGNOSIS — M7522 Bicipital tendinitis, left shoulder: Secondary | ICD-10-CM | POA: Diagnosis not present

## 2019-12-30 DIAGNOSIS — M25562 Pain in left knee: Secondary | ICD-10-CM

## 2019-12-30 NOTE — Assessment & Plan Note (Signed)
Sapna does have known knee osteoarthritis, she did have a meniscal tear post arthroscopic meniscectomy with Dr. Stann Mainland. She did well until recently, started to have recurrence of pain, medial joint line, she had a steroid injection at her pain provider with no relief, not even temporary, due to historical meniscal tear, recurrence of pain, positive McMurray sign on exam, pain with terminal flexion we are going to proceed with repeat MRI for surgical planning, I do suspect she will need to get back in with Dr. Stann Mainland.

## 2019-12-30 NOTE — Assessment & Plan Note (Signed)
History of a C4-C7 fusion with anterior and posterior instrumentation. It sounds like her pain provider is going to be proceeding with cervical medial branch blocks. No further intervention from [place]. She does have some left-sided radicular symptoms that responded well to a cervical epidural in 2019.

## 2019-12-30 NOTE — Assessment & Plan Note (Signed)
[name] has impingement symptoms and signs in her left shoulder, we have dealt with this before and she has had the symptoms for several years now. Positive Neer's, empty can sign. Positive speeds test. X-rays, MRI, she has failed greater than 6 weeks of physician directed conservative measures over the past few years. I do plan a subacromial injection however we are going to wait until her cervical medial branch blocks are done as to not interfere with the results.

## 2019-12-30 NOTE — Progress Notes (Signed)
    Procedures performed today:    None.  Independent interpretation of notes and tests performed by another provider:   None.  Brief History, Exam, Impression, and Recommendations:    [name] is a 44yo female who presents today with right knee and left shoulder pain. She has a has a previous meniscal tear in her right knee that was treated with arthroscopy. She said the pain feels similar with locking, popping, and pain with rotation of the knee. She has positive pain with terminal flexion, mcmurrays, and thessalys. This is potentially a meniscal tear. We are going to get an MRI of her right knee for evaluation and intervention planning.   The pain in the left shoulder is worse with lifting her arm. She has a history of cervical fusion. We have evaluated her for this pain in the past. She has a positive speeds and neers test suggesting that some of her pain may be coming from impingement. We are going to get an MRI of her left shoulder today. She has a nerve block of her facet joints scheduled which may also relieve some of this pain.   [name], MS3   ___________________________________________ Gwen Her. Dianah Field, M.D., ABFM., CAQSM. Primary Care and [place] Instructor of [place] [facility] Medicine

## 2019-12-31 DIAGNOSIS — M5030 Other cervical disc degeneration, unspecified cervical region: Secondary | ICD-10-CM | POA: Diagnosis not present

## 2020-01-02 DIAGNOSIS — M7502 Adhesive capsulitis of left shoulder: Secondary | ICD-10-CM | POA: Diagnosis not present

## 2020-01-02 DIAGNOSIS — M84361A Stress fracture, right tibia, initial encounter for fracture: Secondary | ICD-10-CM | POA: Diagnosis not present

## 2020-01-02 DIAGNOSIS — S83241A Other tear of medial meniscus, current injury, right knee, initial encounter: Secondary | ICD-10-CM | POA: Diagnosis not present

## 2020-01-03 ENCOUNTER — Other Ambulatory Visit: Payer: Self-pay | Admitting: Physician Assistant

## 2020-01-03 DIAGNOSIS — T887XXA Unspecified adverse effect of drug or medicament, initial encounter: Secondary | ICD-10-CM

## 2020-01-07 ENCOUNTER — Other Ambulatory Visit: Payer: Self-pay | Admitting: Physician Assistant

## 2020-01-07 DIAGNOSIS — G2581 Restless legs syndrome: Secondary | ICD-10-CM

## 2020-01-08 ENCOUNTER — Other Ambulatory Visit (HOSPITAL_COMMUNITY): Payer: Medicare Other

## 2020-01-08 ENCOUNTER — Other Ambulatory Visit: Payer: Self-pay | Admitting: Physician Assistant

## 2020-01-08 DIAGNOSIS — A6000 Herpesviral infection of urogenital system, unspecified: Secondary | ICD-10-CM

## 2020-01-08 DIAGNOSIS — Z8619 Personal history of other infectious and parasitic diseases: Secondary | ICD-10-CM

## 2020-01-09 DIAGNOSIS — M25561 Pain in right knee: Secondary | ICD-10-CM | POA: Diagnosis not present

## 2020-01-09 DIAGNOSIS — M1710 Unilateral primary osteoarthritis, unspecified knee: Secondary | ICD-10-CM | POA: Diagnosis not present

## 2020-01-09 DIAGNOSIS — Z79899 Other long term (current) drug therapy: Secondary | ICD-10-CM | POA: Diagnosis not present

## 2020-01-09 DIAGNOSIS — F17210 Nicotine dependence, cigarettes, uncomplicated: Secondary | ICD-10-CM | POA: Diagnosis not present

## 2020-01-09 DIAGNOSIS — M5412 Radiculopathy, cervical region: Secondary | ICD-10-CM | POA: Diagnosis not present

## 2020-01-09 DIAGNOSIS — Z981 Arthrodesis status: Secondary | ICD-10-CM | POA: Diagnosis not present

## 2020-01-12 ENCOUNTER — Other Ambulatory Visit: Payer: Self-pay | Admitting: Physician Assistant

## 2020-01-12 DIAGNOSIS — L237 Allergic contact dermatitis due to plants, except food: Secondary | ICD-10-CM

## 2020-01-13 ENCOUNTER — Encounter: Payer: Self-pay | Admitting: Physician Assistant

## 2020-01-14 ENCOUNTER — Other Ambulatory Visit: Payer: Self-pay | Admitting: Physician Assistant

## 2020-01-14 ENCOUNTER — Encounter: Payer: Self-pay | Admitting: Physician Assistant

## 2020-01-14 ENCOUNTER — Telehealth: Payer: Self-pay | Admitting: Unknown Physician Specialty

## 2020-01-14 ENCOUNTER — Other Ambulatory Visit: Payer: Self-pay | Admitting: Unknown Physician Specialty

## 2020-01-14 ENCOUNTER — Telehealth (INDEPENDENT_AMBULATORY_CARE_PROVIDER_SITE_OTHER): Payer: Medicare Other | Admitting: Physician Assistant

## 2020-01-14 VITALS — BP 118/75 | HR 74 | Temp 97.6°F | Ht 63.0 in | Wt 140.0 lb

## 2020-01-14 DIAGNOSIS — U071 COVID-19: Secondary | ICD-10-CM | POA: Insufficient documentation

## 2020-01-14 DIAGNOSIS — F17200 Nicotine dependence, unspecified, uncomplicated: Secondary | ICD-10-CM

## 2020-01-14 DIAGNOSIS — R059 Cough, unspecified: Secondary | ICD-10-CM

## 2020-01-14 DIAGNOSIS — R110 Nausea: Secondary | ICD-10-CM

## 2020-01-14 DIAGNOSIS — R05 Cough: Secondary | ICD-10-CM

## 2020-01-14 DIAGNOSIS — J4520 Mild intermittent asthma, uncomplicated: Secondary | ICD-10-CM

## 2020-01-14 DIAGNOSIS — F411 Generalized anxiety disorder: Secondary | ICD-10-CM

## 2020-01-14 DIAGNOSIS — R197 Diarrhea, unspecified: Secondary | ICD-10-CM | POA: Diagnosis not present

## 2020-01-14 DIAGNOSIS — I10 Essential (primary) hypertension: Secondary | ICD-10-CM

## 2020-01-14 MED ORDER — ALBUTEROL SULFATE HFA 108 (90 BASE) MCG/ACT IN AERS: 2.0000 | INHALATION_SPRAY | Freq: Four times a day (QID) | RESPIRATORY_TRACT | 0 refills | Status: DC | PRN

## 2020-01-14 MED ORDER — HYDROCODONE-HOMATROPINE 5-1.5 MG/5ML PO SYRP: 5.0000 mL | ORAL_SOLUTION | Freq: Two times a day (BID) | ORAL | 0 refills | Status: DC | PRN

## 2020-01-14 NOTE — Telephone Encounter (Signed)
I connected by phone with [name] on 01/14/2020 at 5:50 PM to discuss the potential use of a new treatment for mild to moderate COVID-19 viral infection in non-hospitalized patients.  This patient is a 45 y.o. female that meets the FDA criteria for Emergency Use Authorization of COVID monoclonal antibody casirivimab/imdevimab.  Has a (+) direct SARS-CoV-2 viral test result  Has mild or moderate COVID-19   Is NOT hospitalized due to COVID-19  Is within 10 days of symptom onset  Has at least one of the high risk factor(s) for progression to severe COVID-19 and/or hospitalization as defined in EUA.  Specific high risk criteria : Cardiovascular disease or hypertension and Chronic Lung Disease   I have spoken and communicated the following to the patient or parent/caregiver regarding COVID monoclonal antibody treatment:  1. FDA has authorized the emergency use for the treatment of mild to moderate COVID-19 in adults and pediatric patients with positive results of direct SARS-CoV-2 viral testing who are 49 years of age and older weighing at least 40 kg, and who are at high risk for progressing to severe COVID-19 and/or hospitalization.  2. The significant known and potential risks and benefits of COVID monoclonal antibody, and the extent to which such potential risks and benefits are unknown.  3. Information on available alternative treatments and the risks and benefits of those alternatives, including clinical trials.  4. Patients treated with COVID monoclonal antibody should continue to self-isolate and use infection control measures (e.g., wear mask, isolate, social distance, avoid sharing personal items, clean and disinfect "high touch" surfaces, and frequent handwashing) according to CDC guidelines.   5. The patient or parent/caregiver has the option to accept or refuse COVID monoclonal antibody treatment.  After reviewing this information with the patient, The patient  agreed to proceed with receiving casirivimab\imdevimab infusion and will be provided a copy of the Fact sheet prior to receiving the infusion. [name] 01/14/2020 5:50 PM  Sx onset 9/11

## 2020-01-14 NOTE — Telephone Encounter (Signed)
Last written 07/12/2019 #30 with 5 refills

## 2020-01-14 NOTE — Progress Notes (Signed)
Symptoms started Saturday: Nausea Diarrhea Headache Congestion Cough Sore throat Body aches chills  Had fever Saturday (100) none since  Covid tested at [facility] yesterday - +  Not taking anything OTC for symptoms

## 2020-01-14 NOTE — Progress Notes (Signed)
Patient ID: [name], female   DOB: 10/14/1975, 44 y.o.   MRN: [id number] .Marland KitchenVirtual Visit via Telephone Note  I connected with [name] on 01/14/20 at 10:10 AM EDT by telephone and verified that I am speaking with the correct person using two identifiers.  Location: Patient: home Provider: clinic   I discussed the limitations, risks, security and privacy concerns of performing an evaluation and management service by telephone and the availability of in person appointments. I also discussed with the patient that there may be a patient responsible charge related to this service. The patient expressed understanding and agreed to proceed.   History of Present Illness: Pt is a 44 yo female with Asthma, HTN and current smoker who started having covid symptoms on 01/11/2020 and tested at [facility] and was positive. Her daughter is also positive.   Symptoms: Nausea, Diarrhea,Headache, Congestion, Cough, Sore throat, body aches, chills, no taste of smell.   Her nausea, diarrhea and cough are the worst. Fever was 100 Saturday night but not had once since.   Covid testing done at [facility] yesterday.   Covid tested at [facility] yesterday - +  Currently not taking anything for symptoms. Phenergan 25mg  is not helping the nausea very well.   .. Active Ambulatory Problems    Diagnosis Date Noted  . Anxiety state 07/06/2007  . Smoker 02/05/2010  . Depressed 06/26/2008  . INTERSTITIAL CYSTITIS 02/05/2010  . Insomnia 07/06/2007  . FATIGUE 07/02/2009  . HEADACHE 07/06/2007  . TROCHANTERIC BURSITIS 06/21/2010  . Tick-borne disease 08/09/2010  . PTSD (post-traumatic stress disorder) 12/14/2012  . Weight gain 11/29/2013  . Greater trochanteric bursitis 11/29/2013  . Constipation due to opioid therapy 01/17/2014  . Left shoulder pain 04/02/2014  . Carpal tunnel syndrome of left wrist 05/05/2014  . Myofascial pain syndrome 05/29/2014  . Paresthesia 06/03/2014  .  Endometriosis 07/20/2014  . Tachycardia 08/13/2014  . S/P cervical spinal fusion 10/28/2014  . Therapeutic opioid induced constipation 12/16/2014  . Vasomotor flushing 06/15/2015  . GAD (generalized anxiety disorder) 06/15/2015  . Fixed pupils 08/31/2015  . Dysuria 08/31/2015  . Depression 08/31/2015  . Primary osteoarthritis of left first metacarpophalangeal joint 04/04/2016  . Palpitations 04/14/2016  . Balance problems 07/19/2016  . Dizziness 07/19/2016  . Primary osteoarthritis of right knee 09/05/2016  . Trigger thumb, left thumb 10/06/2016  . Chronic pain syndrome 01/08/2017  . 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  . First degree ankle sprain, left, initial encounter 08/29/2017  . Hypertension 10/06/2017  . Hx of cold sores 10/06/2017  . Asthma 01/29/2018  . Chronic neck pain 01/29/2018  . Traumatic ecchymosis of left foot 10/15/2018  . Mild episode of recurrent major depressive disorder ([place]) 10/30/2018  . Dog bite 02/15/2019  . Leukocytes in urine 03/04/2019  . Other microscopic hematuria 03/04/2019  . Left ovarian cyst 03/04/2019  . RLS (restless legs syndrome) 07/08/2019  . Mood changes 08/25/2019  . Pes anserine bursitis 09/03/2019  . Nodule right fifth PIP of hand 09/03/2019  . Impingement syndrome, shoulder, left 12/30/2019  . COVID-19 virus infection 01/14/2020   Resolved Ambulatory Problems    Diagnosis Date Noted  . RHINITIS 10/13/2009  . Polyuria 11/04/2010  . Degenerative disc disease, cervical 10/31/2013  . Cervical disc disorder with radiculopathy of cervical region 11/29/2013  . Itching 11/29/2013  . Radiculitis of left cervical region 01/17/2014  . Nausea with vomiting 11/25/2014  . Dysmenorrhea 05/11/2015  . Cervical disc disorder with radiculopathy of cervical region 11/11/2015  . Acute  upper respiratory infection 04/04/2016  . Localized primary carpometacarpal osteoarthritis, left 04/04/2016  . Cervical post-laminectomy syndrome 07/08/2019   Past Medical  History:  Diagnosis Date  . Anxiety   . Complication of anesthesia   . Dysrhythmia   . Elevated heart rate and blood pressure   . Family history of adverse reaction to anesthesia   . Fibromyalgia   . IC (interstitial cystitis)   . Myofascial pain syndrome, cervical   . Neck pain   . Ovarian cyst   . PONV (postoperative nausea and vomiting)    Reviewed med, allergy, problem list.     Observations/Objective: No acute distress Cough more dry with no labored breathing  .[name] Today's Vitals   01/14/20 0940  BP: 118/75  Pulse: 74  Temp: 97.6 F (36.4 C)  TempSrc: Oral  Weight: 140 lb (63.5 kg)  Height: 5\' 3"  (1.6 m)   Body mass index is 24.8 kg/m.    Assessment and Plan: .Marland KitchenVerneta was seen today for covid positive.  Diagnoses and all orders for this visit:  COVID-19 virus infection  Diarrhea, unspecified type  Cough  Nausea   Quarantine until 9/22. Discussed symptomatic management of symptoms and worrisome signs to look for. Refilled albuterol inhaler. Ok to take 1-2 phenergan for nausea. Cough syrup as needed. Watch for sedation. Rest and hydrate. Keep moving and good deep breaths. Ok to consider low dose baby ASA 81mg  for blood clot prevention. [place] for zinc/vitamin C/vitamin D for immune system support. Keep BRAT diet.  Looking over chart she would actually qualify for monoclonal antibody infusion due to asthma and hypertension and current smoker. Will make referral for screening call.   Marland KitchenMarland KitchenPDMP reviewed during this encounter.    Follow Up Instructions:    I discussed the assessment and treatment plan with the patient. The patient was provided an opportunity to ask questions and all were answered. The patient agreed with the plan and demonstrated an understanding of the instructions.   The patient was advised to call back or seek an in-person evaluation if the symptoms worsen or if the condition fails to improve as anticipated.  I provided  10 minutes of  non-face-to-face time during this encounter.   Iran Planas, PA-C

## 2020-01-15 ENCOUNTER — Ambulatory Visit (HOSPITAL_COMMUNITY)
Admission: RE | Admit: 2020-01-15 | Discharge: 2020-01-15 | Disposition: A | Discharge status: Home / Self Care | Payer: Medicare Other | Source: Ambulatory Visit | Attending: Pulmonary Disease | Admitting: Pulmonary Disease

## 2020-01-15 ENCOUNTER — Other Ambulatory Visit (HOSPITAL_COMMUNITY): Payer: Self-pay

## 2020-01-15 DIAGNOSIS — Z23 Encounter for immunization: Secondary | ICD-10-CM | POA: Insufficient documentation

## 2020-01-15 DIAGNOSIS — U071 COVID-19: Secondary | ICD-10-CM

## 2020-01-15 DIAGNOSIS — I10 Essential (primary) hypertension: Secondary | ICD-10-CM | POA: Diagnosis not present

## 2020-01-15 DIAGNOSIS — J4520 Mild intermittent asthma, uncomplicated: Secondary | ICD-10-CM

## 2020-01-15 HISTORY — DX: COVID-19: U07.1

## 2020-01-15 MED ORDER — FAMOTIDINE IN NACL 20-0.9 MG/50ML-% IV SOLN: 20.0000 mg | Freq: Once | INTRAVENOUS | Status: DC | PRN

## 2020-01-15 MED ORDER — SODIUM CHLORIDE 0.9 % IV SOLN
1200.0000 mg | Freq: Once | INTRAVENOUS | Status: AC
  Administered 2020-01-15: 1200 mg via INTRAVENOUS

## 2020-01-15 MED ORDER — METHYLPREDNISOLONE SODIUM SUCC 125 MG IJ SOLR: 125.0000 mg | Freq: Once | INTRAMUSCULAR | Status: DC | PRN

## 2020-01-15 MED ORDER — ONDANSETRON HCL 4 MG/2ML IJ SOLN
4.0000 mg | Freq: Once | INTRAMUSCULAR | Status: AC
  Administered 2020-01-15: 4 mg via INTRAVENOUS
  Filled 2020-01-15: qty 2

## 2020-01-15 MED ORDER — SODIUM CHLORIDE 0.9 % IV SOLN: INTRAVENOUS | Status: DC | PRN

## 2020-01-15 MED ORDER — EPINEPHRINE 0.3 MG/0.3ML IJ SOAJ: 0.3000 mg | Freq: Once | INTRAMUSCULAR | Status: DC | PRN

## 2020-01-15 MED ORDER — ACETAMINOPHEN 325 MG PO TABS
650.0000 mg | ORAL_TABLET | Freq: Once | ORAL | Status: AC
  Administered 2020-01-15: 650 mg via ORAL
  Filled 2020-01-15: qty 2

## 2020-01-15 MED ORDER — ALBUTEROL SULFATE HFA 108 (90 BASE) MCG/ACT IN AERS: 2.0000 | INHALATION_SPRAY | Freq: Once | RESPIRATORY_TRACT | Status: DC | PRN

## 2020-01-15 MED ORDER — DIPHENHYDRAMINE HCL 50 MG/ML IJ SOLN: 50.0000 mg | Freq: Once | INTRAMUSCULAR | Status: DC | PRN

## 2020-01-15 NOTE — Discharge Instructions (Signed)

## 2020-01-15 NOTE — Progress Notes (Signed)
  Diagnosis: COVID-19  Physician: Dr. Wright  Procedure: Covid Infusion Clinic Med: casirivimab\imdevimab infusion - Provided patient with casirivimab\imdevimab fact sheet for patients, parents and caregivers prior to infusion.  Complications: No immediate complications noted.  Discharge: Discharged home   [name] [name] 01/15/2020  

## 2020-01-17 ENCOUNTER — Other Ambulatory Visit (HOSPITAL_COMMUNITY): Payer: Medicare Other

## 2020-01-29 ENCOUNTER — Encounter (HOSPITAL_BASED_OUTPATIENT_CLINIC_OR_DEPARTMENT_OTHER): Payer: Self-pay | Admitting: Orthopedic Surgery

## 2020-01-29 ENCOUNTER — Other Ambulatory Visit: Payer: Self-pay

## 2020-01-29 NOTE — Progress Notes (Signed)
Spoke w/ via phone for pre-op interview---pt Lab needs dos----    I stat 8, ekg            Lab results------none COVID test ------not needed covid positive 01-15-2020 Arrive at -------1030 am 02-04-20  NPO after MN NO Solid Food.  Clear liquids from MN until---930 am then npo Medications to take morning of surgery -----albuterol inhaler bring, breo ellipta, lyrica, metorpolol succinate, pantaprazole, hydrocodome Diabetic medication -----none Patient Special Instructions -----none Pre-Op special Istructions -----none Patient verbalized understanding of instructions that were given at this phone interview. Patient denies shortness of breath, chest pain, fever, cough at this phone interview.

## 2020-02-04 ENCOUNTER — Other Ambulatory Visit: Payer: Self-pay | Admitting: Physician Assistant

## 2020-02-04 ENCOUNTER — Ambulatory Visit (HOSPITAL_COMMUNITY): Payer: Medicare Other

## 2020-02-04 ENCOUNTER — Encounter (HOSPITAL_BASED_OUTPATIENT_CLINIC_OR_DEPARTMENT_OTHER)
Admission: RE | Disposition: A | Discharge status: Home / Self Care | Payer: Self-pay | Source: Home / Self Care | Attending: Orthopedic Surgery

## 2020-02-04 ENCOUNTER — Encounter (HOSPITAL_BASED_OUTPATIENT_CLINIC_OR_DEPARTMENT_OTHER): Payer: Self-pay | Admitting: Orthopedic Surgery

## 2020-02-04 ENCOUNTER — Ambulatory Visit (HOSPITAL_BASED_OUTPATIENT_CLINIC_OR_DEPARTMENT_OTHER)
Admission: RE | Admit: 2020-02-04 | Discharge: 2020-02-04 | Disposition: A | Discharge status: Home / Self Care | Payer: Medicare Other | Attending: Orthopedic Surgery | Admitting: Orthopedic Surgery

## 2020-02-04 ENCOUNTER — Ambulatory Visit (HOSPITAL_BASED_OUTPATIENT_CLINIC_OR_DEPARTMENT_OTHER): Payer: Medicare Other | Admitting: Anesthesiology

## 2020-02-04 DIAGNOSIS — X58XXXA Exposure to other specified factors, initial encounter: Secondary | ICD-10-CM | POA: Diagnosis not present

## 2020-02-04 DIAGNOSIS — Z818 Family history of other mental and behavioral disorders: Secondary | ICD-10-CM | POA: Diagnosis not present

## 2020-02-04 DIAGNOSIS — F419 Anxiety disorder, unspecified: Secondary | ICD-10-CM | POA: Diagnosis not present

## 2020-02-04 DIAGNOSIS — Z8249 Family history of ischemic heart disease and other diseases of the circulatory system: Secondary | ICD-10-CM | POA: Insufficient documentation

## 2020-02-04 DIAGNOSIS — Z8616 Personal history of COVID-19: Secondary | ICD-10-CM | POA: Diagnosis not present

## 2020-02-04 DIAGNOSIS — Z9889 Other specified postprocedural states: Secondary | ICD-10-CM | POA: Diagnosis not present

## 2020-02-04 DIAGNOSIS — M659 Synovitis and tenosynovitis, unspecified: Secondary | ICD-10-CM | POA: Diagnosis not present

## 2020-02-04 DIAGNOSIS — M797 Fibromyalgia: Secondary | ICD-10-CM | POA: Diagnosis not present

## 2020-02-04 DIAGNOSIS — N3010 Interstitial cystitis (chronic) without hematuria: Secondary | ICD-10-CM | POA: Insufficient documentation

## 2020-02-04 DIAGNOSIS — M1990 Unspecified osteoarthritis, unspecified site: Secondary | ICD-10-CM | POA: Insufficient documentation

## 2020-02-04 DIAGNOSIS — K219 Gastro-esophageal reflux disease without esophagitis: Secondary | ICD-10-CM | POA: Diagnosis not present

## 2020-02-04 DIAGNOSIS — J45909 Unspecified asthma, uncomplicated: Secondary | ICD-10-CM | POA: Insufficient documentation

## 2020-02-04 DIAGNOSIS — M94261 Chondromalacia, right knee: Secondary | ICD-10-CM | POA: Diagnosis not present

## 2020-02-04 DIAGNOSIS — I10 Essential (primary) hypertension: Secondary | ICD-10-CM | POA: Diagnosis not present

## 2020-02-04 DIAGNOSIS — G43909 Migraine, unspecified, not intractable, without status migrainosus: Secondary | ICD-10-CM | POA: Diagnosis not present

## 2020-02-04 DIAGNOSIS — Z79899 Other long term (current) drug therapy: Secondary | ICD-10-CM | POA: Diagnosis not present

## 2020-02-04 DIAGNOSIS — S83241A Other tear of medial meniscus, current injury, right knee, initial encounter: Secondary | ICD-10-CM | POA: Insufficient documentation

## 2020-02-04 DIAGNOSIS — F32A Depression, unspecified: Secondary | ICD-10-CM | POA: Diagnosis not present

## 2020-02-04 DIAGNOSIS — Z981 Arthrodesis status: Secondary | ICD-10-CM | POA: Diagnosis not present

## 2020-02-04 DIAGNOSIS — Z90710 Acquired absence of both cervix and uterus: Secondary | ICD-10-CM | POA: Diagnosis not present

## 2020-02-04 DIAGNOSIS — G4700 Insomnia, unspecified: Secondary | ICD-10-CM | POA: Insufficient documentation

## 2020-02-04 DIAGNOSIS — Z91012 Allergy to eggs: Secondary | ICD-10-CM | POA: Insufficient documentation

## 2020-02-04 DIAGNOSIS — Z888 Allergy status to other drugs, medicaments and biological substances status: Secondary | ICD-10-CM | POA: Insufficient documentation

## 2020-02-04 DIAGNOSIS — M84361A Stress fracture, right tibia, initial encounter for fracture: Secondary | ICD-10-CM | POA: Diagnosis not present

## 2020-02-04 DIAGNOSIS — Z419 Encounter for procedure for purposes other than remedying health state, unspecified: Secondary | ICD-10-CM

## 2020-02-04 DIAGNOSIS — Z885 Allergy status to narcotic agent status: Secondary | ICD-10-CM | POA: Insufficient documentation

## 2020-02-04 DIAGNOSIS — S82121A Displaced fracture of lateral condyle of right tibia, initial encounter for closed fracture: Secondary | ICD-10-CM | POA: Diagnosis not present

## 2020-02-04 DIAGNOSIS — F1120 Opioid dependence, uncomplicated: Secondary | ICD-10-CM | POA: Insufficient documentation

## 2020-02-04 DIAGNOSIS — M2241 Chondromalacia patellae, right knee: Secondary | ICD-10-CM | POA: Diagnosis not present

## 2020-02-04 DIAGNOSIS — M84461A Pathological fracture, right tibia, initial encounter for fracture: Secondary | ICD-10-CM | POA: Diagnosis not present

## 2020-02-04 DIAGNOSIS — F17210 Nicotine dependence, cigarettes, uncomplicated: Secondary | ICD-10-CM | POA: Diagnosis not present

## 2020-02-04 DIAGNOSIS — M65861 Other synovitis and tenosynovitis, right lower leg: Secondary | ICD-10-CM | POA: Diagnosis not present

## 2020-02-04 DIAGNOSIS — Z9079 Acquired absence of other genital organ(s): Secondary | ICD-10-CM | POA: Insufficient documentation

## 2020-02-04 DIAGNOSIS — Z96651 Presence of right artificial knee joint: Secondary | ICD-10-CM | POA: Diagnosis not present

## 2020-02-04 DIAGNOSIS — F17200 Nicotine dependence, unspecified, uncomplicated: Secondary | ICD-10-CM | POA: Insufficient documentation

## 2020-02-04 DIAGNOSIS — Z7951 Long term (current) use of inhaled steroids: Secondary | ICD-10-CM | POA: Insufficient documentation

## 2020-02-04 DIAGNOSIS — G2581 Restless legs syndrome: Secondary | ICD-10-CM

## 2020-02-04 DIAGNOSIS — G8918 Other acute postprocedural pain: Secondary | ICD-10-CM | POA: Diagnosis not present

## 2020-02-04 HISTORY — DX: Gastro-esophageal reflux disease without esophagitis: K21.9

## 2020-02-04 HISTORY — DX: Other tear of medial meniscus, current injury, right knee, initial encounter: S83.241A

## 2020-02-04 HISTORY — PX: KNEE ARTHROSCOPY WITH SUBCHONDROPLASTY: SHX6732

## 2020-02-04 LAB — POCT PREGNANCY, URINE: Preg Test, Ur: NEGATIVE

## 2020-02-04 LAB — POCT I-STAT, CHEM 8
BUN: 14 mg/dL (ref 6–20)
Calcium, Ion: 1.22 mmol/L (ref 1.15–1.40)
Chloride: 100 mmol/L (ref 98–111)
Creatinine, Ser: 0.8 mg/dL (ref 0.44–1.00)
Glucose, Bld: 93 mg/dL (ref 70–99)
HCT: 44 % (ref 36.0–46.0)
Hemoglobin: 15 g/dL (ref 12.0–15.0)
Potassium: 3.2 mmol/L — ABNORMAL LOW (ref 3.5–5.1)
Sodium: 138 mmol/L (ref 135–145)
TCO2: 24 mmol/L (ref 22–32)

## 2020-02-04 SURGERY — ARTHROSCOPY, KNEE, WITH SUBCHONDROPLASTY
Anesthesia: Regional | Site: Knee | Laterality: Right

## 2020-02-04 MED ORDER — ONDANSETRON 4 MG PO TBDP: 4.0000 mg | ORAL_TABLET | Freq: Three times a day (TID) | ORAL | 0 refills | Status: DC | PRN

## 2020-02-04 MED ORDER — OXYCODONE HCL 5 MG PO TABS: 5.0000 mg | ORAL_TABLET | ORAL | 0 refills | Status: DC | PRN

## 2020-02-04 MED ORDER — DIPHENHYDRAMINE HCL 50 MG/ML IJ SOLN
INTRAMUSCULAR | Status: AC
  Filled 2020-02-04: qty 1

## 2020-02-04 MED ORDER — FENTANYL CITRATE (PF) 100 MCG/2ML IJ SOLN
25.0000 ug | INTRAMUSCULAR | Status: DC | PRN
  Administered 2020-02-04: 50 ug via INTRAVENOUS
  Administered 2020-02-04: 25 ug via INTRAVENOUS

## 2020-02-04 MED ORDER — PROMETHAZINE HCL 25 MG/ML IJ SOLN: 6.2500 mg | INTRAMUSCULAR | Status: DC | PRN

## 2020-02-04 MED ORDER — LIDOCAINE 2% (20 MG/ML) 5 ML SYRINGE
INTRAMUSCULAR | Status: DC | PRN
  Administered 2020-02-04: 60 mg via INTRAVENOUS

## 2020-02-04 MED ORDER — SCOPOLAMINE 1 MG/3DAYS TD PT72
1.0000 | MEDICATED_PATCH | TRANSDERMAL | Status: DC
  Administered 2020-02-04: 1.5 mg via TRANSDERMAL

## 2020-02-04 MED ORDER — DEXAMETHASONE SODIUM PHOSPHATE 10 MG/ML IJ SOLN
INTRAMUSCULAR | Status: DC | PRN
  Administered 2020-02-04: 10 mg via INTRAVENOUS

## 2020-02-04 MED ORDER — DIPHENHYDRAMINE HCL 50 MG/ML IJ SOLN
12.5000 mg | Freq: Once | INTRAMUSCULAR | Status: AC
  Administered 2020-02-04: 12.5 mg via INTRAVENOUS

## 2020-02-04 MED ORDER — KETOROLAC TROMETHAMINE 30 MG/ML IJ SOLN
INTRAMUSCULAR | Status: DC | PRN
  Administered 2020-02-04: 30 mg via INTRAVENOUS

## 2020-02-04 MED ORDER — MIDAZOLAM HCL 2 MG/2ML IJ SOLN
2.0000 mg | Freq: Once | INTRAMUSCULAR | Status: AC
  Administered 2020-02-04: 2 mg via INTRAVENOUS

## 2020-02-04 MED ORDER — DEXMEDETOMIDINE (PRECEDEX) IN NS 20 MCG/5ML (4 MCG/ML) IV SYRINGE
PREFILLED_SYRINGE | INTRAVENOUS | Status: DC | PRN
  Administered 2020-02-04 (×3): 4 ug via INTRAVENOUS
  Administered 2020-02-04: 8 ug via INTRAVENOUS

## 2020-02-04 MED ORDER — SCOPOLAMINE 1 MG/3DAYS TD PT72
MEDICATED_PATCH | TRANSDERMAL | Status: AC
  Filled 2020-02-04: qty 1

## 2020-02-04 MED ORDER — ACETAMINOPHEN 500 MG PO TABS
ORAL_TABLET | ORAL | Status: AC
  Filled 2020-02-04: qty 2

## 2020-02-04 MED ORDER — ACETAMINOPHEN 500 MG PO TABS
1000.0000 mg | ORAL_TABLET | Freq: Once | ORAL | Status: AC
  Administered 2020-02-04: 1000 mg via ORAL

## 2020-02-04 MED ORDER — DEXMEDETOMIDINE (PRECEDEX) IN NS 20 MCG/5ML (4 MCG/ML) IV SYRINGE
PREFILLED_SYRINGE | INTRAVENOUS | Status: AC
  Filled 2020-02-04: qty 5

## 2020-02-04 MED ORDER — HYDROMORPHONE HCL 2 MG PO TABS: 2.0000 mg | ORAL_TABLET | ORAL | 0 refills | Status: AC | PRN

## 2020-02-04 MED ORDER — FENTANYL CITRATE (PF) 100 MCG/2ML IJ SOLN
100.0000 ug | Freq: Once | INTRAMUSCULAR | Status: AC
  Administered 2020-02-04: 100 ug via INTRAVENOUS

## 2020-02-04 MED ORDER — PROPOFOL 10 MG/ML IV BOLUS
INTRAVENOUS | Status: DC | PRN
  Administered 2020-02-04: 160 mg via INTRAVENOUS

## 2020-02-04 MED ORDER — FENTANYL CITRATE (PF) 100 MCG/2ML IJ SOLN
INTRAMUSCULAR | Status: AC
  Filled 2020-02-04: qty 2

## 2020-02-04 MED ORDER — BUPIVACAINE-EPINEPHRINE 0.25% -1:200000 IJ SOLN
INTRAMUSCULAR | Status: DC | PRN
  Administered 2020-02-04: 20 mL

## 2020-02-04 MED ORDER — BUPIVACAINE HCL (PF) 0.5 % IJ SOLN
INTRAMUSCULAR | Status: DC | PRN
  Administered 2020-02-04: 30 mL

## 2020-02-04 MED ORDER — ONDANSETRON HCL 4 MG/2ML IJ SOLN
INTRAMUSCULAR | Status: DC | PRN
  Administered 2020-02-04: 4 mg via INTRAVENOUS

## 2020-02-04 MED ORDER — MIDAZOLAM HCL 2 MG/2ML IJ SOLN
INTRAMUSCULAR | Status: AC
  Filled 2020-02-04: qty 2

## 2020-02-04 MED ORDER — FENTANYL CITRATE (PF) 100 MCG/2ML IJ SOLN
INTRAMUSCULAR | Status: DC | PRN
Start: 2020-02-04 — End: 2020-02-04
  Administered 2020-02-04 (×2): 25 ug via INTRAVENOUS
  Administered 2020-02-04: 50 ug via INTRAVENOUS

## 2020-02-04 MED ORDER — CEFAZOLIN SODIUM-DEXTROSE 2-4 GM/100ML-% IV SOLN
INTRAVENOUS | Status: AC
  Filled 2020-02-04: qty 100

## 2020-02-04 MED ORDER — SODIUM CHLORIDE 0.9 % IR SOLN
Status: DC | PRN
  Administered 2020-02-04: 1500 mL

## 2020-02-04 MED ORDER — CEFAZOLIN SODIUM-DEXTROSE 2-4 GM/100ML-% IV SOLN
2.0000 g | INTRAVENOUS | Status: AC
  Administered 2020-02-04: 2 g via INTRAVENOUS

## 2020-02-04 MED ORDER — LACTATED RINGERS IV SOLN: INTRAVENOUS | Status: DC

## 2020-02-04 MED ORDER — DIPHENHYDRAMINE HCL 50 MG/ML IJ SOLN
INTRAMUSCULAR | Status: DC | PRN
  Administered 2020-02-04: 25 mg via INTRAVENOUS

## 2020-02-04 SURGICAL SUPPLY — 44 items
ABLATOR ASPIRATE 50D MULTI-PRT (SURGICAL WAND) IMPLANT
BANDAGE ESMARK 6X9 LF (GAUZE/BANDAGES/DRESSINGS) ×1 IMPLANT
BLADE SHAVER TORPEDO 4X13 (MISCELLANEOUS) ×2 IMPLANT
BNDG CMPR 9X6 STRL LF SNTH (GAUZE/BANDAGES/DRESSINGS) ×1
BNDG ELASTIC 6X5.8 VLCR STR LF (GAUZE/BANDAGES/DRESSINGS) ×2 IMPLANT
BNDG ESMARK 6X9 LF (GAUZE/BANDAGES/DRESSINGS) ×2
BNDG GAUZE ELAST 4 BULKY (GAUZE/BANDAGES/DRESSINGS) ×2 IMPLANT
BURR CLEARCUT OVAL 5.5X13 (MISCELLANEOUS) IMPLANT
BURR OVAL 12 FL 5.5X13 (MISCELLANEOUS)
COVER MAYO STAND STRL (DRAPES) ×2 IMPLANT
COVER WAND RF STERILE (DRAPES) ×2 IMPLANT
CUFF TOURN SGL QUICK 34 (TOURNIQUET CUFF) ×2
CUFF TRNQT CYL 34X4.125X (TOURNIQUET CUFF) ×1 IMPLANT
DRAPE ARTHROSCOPY W/POUCH 114 (DRAPES) ×2 IMPLANT
DRAPE C-ARM 42X120 X-RAY (DRAPES) ×2 IMPLANT
DRAPE U-SHAPE 47X51 STRL (DRAPES) ×2 IMPLANT
DRSG PAD ABDOMINAL 8X10 ST (GAUZE/BANDAGES/DRESSINGS) ×2 IMPLANT
DURAPREP 26ML APPLICATOR (WOUND CARE) ×2 IMPLANT
GAUZE SPONGE 4X4 12PLY STRL (GAUZE/BANDAGES/DRESSINGS) ×2 IMPLANT
GAUZE SPONGE 4X4 12PLY STRL LF (GAUZE/BANDAGES/DRESSINGS) ×2 IMPLANT
GAUZE XEROFORM 1X8 LF (GAUZE/BANDAGES/DRESSINGS) ×2 IMPLANT
GLOVE BIO SURGEON STRL SZ7.5 (GLOVE) ×4 IMPLANT
GLOVE BIOGEL PI IND STRL 6.5 (GLOVE) ×2 IMPLANT
GLOVE BIOGEL PI IND STRL 7.0 (GLOVE) ×1 IMPLANT
GLOVE BIOGEL PI INDICATOR 6.5 (GLOVE) ×2
GLOVE BIOGEL PI INDICATOR 7.0 (GLOVE) ×1
GLOVE INDICATOR 8.0 STRL GRN (GLOVE) ×4 IMPLANT
GOWN STRL REUS W/TWL LRG LVL3 (GOWN DISPOSABLE) ×2 IMPLANT
GOWN STRL REUS W/TWL XL LVL3 (GOWN DISPOSABLE) ×4 IMPLANT
IV NS IRRIG 3000ML ARTHROMATIC (IV SOLUTION) ×2 IMPLANT
KIT KNEE SCP END 414.503 (Knees) ×2 IMPLANT
KIT TURNOVER CYSTO (KITS) ×2 IMPLANT
MANIFOLD NEPTUNE II (INSTRUMENTS) ×2 IMPLANT
NS IRRIG 500ML POUR BTL (IV SOLUTION) ×2 IMPLANT
PACK ARTHROSCOPY DSU (CUSTOM PROCEDURE TRAY) ×2 IMPLANT
PACK BASIN DAY SURGERY FS (CUSTOM PROCEDURE TRAY) ×2 IMPLANT
PADDING CAST COTTON 6X4 STRL (CAST SUPPLIES) ×2 IMPLANT
PORT APPOLLO RF 90DEGREE MULTI (SURGICAL WAND) IMPLANT
SUT ETHILON 4 0 PS 2 18 (SUTURE) ×2 IMPLANT
SYR CONTROL 10ML LL (SYRINGE) IMPLANT
TOWEL OR 17X26 10 PK STRL BLUE (TOWEL DISPOSABLE) ×2 IMPLANT
TUBE CONNECTING 12X1/4 (SUCTIONS) ×2 IMPLANT
TUBING ARTHROSCOPY IRRIG 16FT (MISCELLANEOUS) ×2 IMPLANT
WRAP KNEE MAXI GEL POST OP (GAUZE/BANDAGES/DRESSINGS) ×2 IMPLANT

## 2020-02-04 NOTE — Anesthesia Preprocedure Evaluation (Addendum)
Anesthesia Evaluation  Patient identified by MRN, date of birth, ID band Patient awake    Reviewed: Allergy & Precautions, NPO status , Patient's Chart, lab work & pertinent test results  History of Anesthesia Complications (+) PONV and history of anesthetic complications  Airway Mallampati: II  TM Distance: >3 FB Neck ROM: Limited    Dental  (+) Lower Dentures, Upper Dentures   Pulmonary asthma , Current Smoker,  Recent Covid infection 01/2020   Pulmonary exam normal breath sounds clear to auscultation       Cardiovascular hypertension, Pt. on medications and Pt. on home beta blockers Normal cardiovascular exam Rhythm:Regular Rate:Normal     Neuro/Psych  Headaches, PSYCHIATRIC DISORDERS Anxiety Depression    GI/Hepatic Neg liver ROS, GERD  ,  Endo/Other  negative endocrine ROS  Renal/GU negative Renal ROS     Musculoskeletal  (+) Arthritis , Fibromyalgia -, narcotic dependent  Abdominal   Peds negative pediatric ROS (+)  Hematology   Anesthesia Other Findings Cervical myofascial pain syndrome  Reproductive/Obstetrics                                                             Anesthesia Evaluation  Patient identified by MRN, date of birth, ID band Patient awake    Reviewed: Allergy & Precautions, NPO status , Patient's Chart, lab work & pertinent test results, reviewed documented beta blocker date and time       Anesthesia Physical Anesthesia Plan  ASA: II  Anesthesia Plan: General and Regional   Post-op Pain Management: GA combined w/ Regional for post-op pain   Induction:   PONV Risk Score and Plan:   Airway Management Planned: LMA  Additional Equipment: None  Intra-op Plan:   Post-operative Plan: Extubation in OR  Informed Consent: I have reviewed the patients History and Physical, chart, labs and discussed the procedure including the risks, benefits and  alternatives for the proposed anesthesia with the patient or authorized representative who has indicated his/her understanding and acceptance.       Plan Discussed with: Anesthesiologist, CRNA and Surgeon  Anesthesia Plan Comments: (Adductor canal block for postop pain control. GA/LMA)       Anesthesia Quick Evaluation

## 2020-02-04 NOTE — Anesthesia Postprocedure Evaluation (Addendum)
Anesthesia Post Note  Patient: [name]  Procedure(s) Performed: Right knee arthroscopy with partial medial meniscectomy with lateral tibia subchondroplasty (Right Knee)     Patient location during evaluation: PACU Anesthesia Type: Regional and General Level of consciousness: awake and alert Pain management: pain level controlled Vital Signs Assessment: post-procedure vital signs reviewed and stable Respiratory status: spontaneous breathing, nonlabored ventilation and respiratory function stable Cardiovascular status: blood pressure returned to baseline and stable Postop Assessment: no apparent nausea or vomiting Anesthetic complications: no   No complications documented.  Last Vitals:  Vitals:   02/04/20 1400 02/04/20 1415  BP: (!) 105/55 113/74  Pulse: 72 76  Resp: 16 16  Temp:  36.7 C  SpO2: 94% 96%    Last Pain:  Vitals:   02/04/20 1415  TempSrc:   PainSc: 4                  Candra R Ashwika Freels

## 2020-02-04 NOTE — H&P (Signed)
ORTHOPAEDIC H and P  REQUESTING PHYSICIAN: Nicholes Stairs, MD  PCP:  Donella Stade, PA-C  Chief Complaint: Right knee pain  HPI: [name] is a 44 y.o. female who complains of recalcitrant right knee pain.  She is here today for arthroscopic intervention with possible medial and lateral meniscectomies and subchondroplasty of the lateral tibia.  No new complaints.  Past Medical History:  Diagnosis Date  . Acute medial meniscus tear of right knee   . Anxiety   . Arthritis   . Asthma   . Complication of anesthesia    likes iv med and scopolamine patch also, limited neck motion  . COVID-19 01/15/2020   antibody infusion given 01-16-2020 no symptoms since infusion  . Dysrhythmia    tachycardia  . Elevated heart rate and blood pressure    takes toprol for increased heart rate  . Family history of adverse reaction to anesthesia    mother got blood clots after anesthesia , mother on heparin now  . Fibromyalgia   . GERD (gastroesophageal reflux disease)   . Headache(784.0)    migraines  . IC (interstitial cystitis)   . Insomnia   . Myofascial pain syndrome, cervical   . Neck pain    C 2 and C 3 slipped disc has neck pain with, limited neck motion  . Ovarian cyst    x 2now  . PONV (postoperative nausea and vomiting)    Past Surgical History:  Procedure Laterality Date  . CARPAL TUNNEL RELEASE Left 06/05/2014   Procedure: LEFT CARPAL TUNNEL RELEASE;  Surgeon: Daryll Brod, MD;  Location: [place];  Service: Orthopedics;  Laterality: Left;  . CARPAL TUNNEL RELEASE Right 08/25/2014   Procedure: RIGHT CARPAL TUNNEL RELEASE;  Surgeon: Daryll Brod, MD;  Location: [place];  Service: Orthopedics;  Laterality: Right;  . CARPAL TUNNEL RELEASE Left 07/14/2015   Procedure: LEFT CARPAL TUNNEL RELEASE;  Surgeon: Daryll Brod, MD;  Location: [place];  Service: Orthopedics;  Laterality: Left;  . CERVICAL SPINE SURGERY   11/2013   dr Joya Salm  . CESAREAN SECTION     x2  . DILATION AND EVACUATION N/A 05/07/2013   Procedure: DILATATION AND EVACUATION with tissue sent for chromosome analysis;  Surgeon: Lovenia Kim, MD;  Location: [facility];  Service: Gynecology;  Laterality: N/A;  . EPIDURAL STEROID INJECTION  03/20/15  . KNEE ARTHROSCOPY WITH MEDIAL MENISECTOMY Right 04/09/2019   Procedure: Right knee arthroscopy fat pad resection with partial medial meniscectomy;  Surgeon: Nicholes Stairs, MD;  Location: [facility];  Service: Orthopedics;  Laterality: Right;  60 mins  . OVARIAN CYST REMOVAL    . PELVIC LAPAROSCOPY  2003   endometriosis  . POSTERIOR CERVICAL FUSION/FORAMINOTOMY N/A 10/28/2014   Procedure: Cervical Four-Cervical Seven Posterior cervical fusion with lateral mass fixation;  Surgeon: Eustace Moore, MD;  Location: [place] [facility];  Service: Neurosurgery;  Laterality: N/A;  posterior  . ROBOTIC ASSISTED LAPAROSCOPIC LYSIS OF ADHESION N/A 07/18/2014   Procedure: ROBOTIC ASSISTED LAPAROSCOPIC  EXCISION POSTERIOR  UTERINE WALL MASS; EXCISION RIGHT MESSALEINGEAL MASS, EXCISION AND ABLATION CULDASAC ENDOMETRIOSIS;  Surgeon: Brien Few, MD;  Location: [facility];  Service: Gynecology;  Laterality: N/A;  . ROBOTIC ASSISTED TOTAL HYSTERECTOMY WITH SALPINGECTOMY Bilateral 05/11/2015   Procedure: ROBOTIC ASSISTED TOTAL HYSTERECTOMY WITH BILATERAL SALPINGECTOMY;  Surgeon: Brien Few, MD;  Location: [facility];  Service: Gynecology;  Laterality: Bilateral;   Social History   Socioeconomic History  .  Marital status: Divorced    Spouse name: Not on file  . Number of children: 2  . Years of education: 20  . Highest education level: Associate degree: occupational, Hotel manager, or vocational program  Occupational History  . 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 Smoking status: Current Every Day Smoker    Packs/day: 0.50    Years: 18.00    Pack  years: 9.00    Types: Cigarettes  . Smokeless tobacco: Never Used  Vaping Use  . Vaping Use: Never used  Substance and Sexual Activity  . Alcohol use: No    Alcohol/week: 0.0 standard drinks  . Drug use: No  . Sexual activity: Yes    Birth control/protection: Surgical  Other Topics Concern  . Not on file  Social History Narrative   Going through separation.  Children Sage and Luisa Hart.  Stays in a lot of pain all the time   Social Determinants of Health   Financial Resource Strain:   . Difficulty of Paying Living Expenses: Not on file  Food Insecurity:   . Worried About Charity fundraiser in the Last Year: Not on file  . Ran Out of Food in the Last Year: Not on file  Transportation Needs:   . Lack of Transportation (Medical): Not on file  . Lack of Transportation (Non-Medical): Not on file  Physical Activity:   . Days of Exercise per Week: Not on file  . Minutes of Exercise per Session: Not on file  Stress:   . Feeling of Stress : Not on file  Social Connections:   . Frequency of Communication with Friends and Family: Not on file  . Frequency of Social Gatherings with Friends and Family: Not on file  . Attends Religious Services: Not on file  . Active Member of Clubs or Organizations: Not on file  . Attends Archivist Meetings: Not on file  . Marital Status: Not on file   Family History  Problem Relation Age of Onset  . Heart attack Father   . ADD / ADHD Brother   . Depression Brother   . Depression Cousin    Allergies  Allergen Reactions  . Gadolinium Derivatives Hives, Itching and Other (See Comments)    After MRI with Multihance finished, patient had redness and itching on chest, tightness in throat.  Patient went to ED to be monitored (04/23/14).had mri since takes benadryl before, tolerates well  . Prednisone Other (See Comments)    Pain all over, "it triggers my myofascial pain syndrome"   . Amitriptyline Other (See Comments)    Gained weight and  does not want to be on.   . Eggs Or Egg-Derived Products Rash  . Hydrocodone-Acetaminophen Itching    Tolerates with benadryl (no reaction to oxycodone)  . Lunesta [Eszopiclone] Other (See Comments)    Metallic taste in mouth  . Tramadol Itching   Prior to Admission medications   Medication Sig Start Date End Date Taking? Authorizing Provider  albuterol (VENTOLIN HFA) 108 (90 Base) MCG/ACT inhaler Inhale 2 puffs into the lungs every 6 (six) hours as needed. 01/14/20  Yes Breeback, Jade L, PA-C  ARIPiprazole (ABILIFY) 2 MG tablet TAKE 1 TABLET BY MOUTH EVERY DAY Patient taking differently: at bedtime.  12/02/19  Yes Breeback, Jade L, PA-C  BREO ELLIPTA 100-25 MCG/INH AEPB INHALE 1 PUFF BY MOUTH EVERY DAY 11/14/19  Yes Breeback, Jade L, PA-C  hydrochlorothiazide (HYDRODIURIL) 12.5  MG tablet Take 1 tablet (12.5 mg total) by mouth daily as needed. APPT FOR FURTHER REFILLS 07/08/19  Yes Breeback, Jade L, PA-C  HYDROcodone-acetaminophen (NORCO/VICODIN) 5-325 MG tablet Take 1 tablet by mouth 3 (three) times daily.   Yes [provider]  HYDROcodone-homatropine (HYCODAN) 5-1.5 MG/5ML syrup Take 5 mLs by mouth every 12 (twelve) hours as needed for cough. 01/14/20  Yes Breeback, Jade L, PA-C  hydrOXYzine (ATARAX/VISTARIL) 50 MG tablet Take 1 tablet (50 mg total) by mouth 3 (three) times daily as needed. Patient taking differently: Take 50 mg by mouth daily.  09/16/19  Yes Breeback, Jade L, PA-C  metoprolol succinate (TOPROL-XL) 100 MG 24 hr tablet Take 1 tablet (100 mg total) by mouth daily. 07/08/19  Yes Breeback, Jade L, PA-C  pantoprazole (PROTONIX) 40 MG tablet Take 1 tablet (40 mg total) by mouth daily. 11/20/19  Yes Breeback, Jade L, PA-C  pregabalin (LYRICA) 200 MG capsule Take 100 mg by mouth 3 (three) times daily. 100 mg tid   Yes [provider]  rizatriptan (MAXALT) 10 MG tablet TAKE 1 TABLET AS NEEDED FOR MIGRAINE, MAY REPEAT 2 HRS PRN 10/30/19  Yes Breeback, Jade L, PA-C  rOPINIRole  (REQUIP) 0.25 MG tablet TAKE 1-3 TABLETS (0.25-0.75 MG TOTAL) BY MOUTH AT BEDTIME. 12/09/19  Yes Breeback, Jade L, PA-C  zolpidem (AMBIEN) 10 MG tablet Take 1 tablet (10 mg total) by mouth at bedtime. Patient taking differently: Take by mouth at bedtime.  07/08/19  Yes Breeback, Jade L, PA-C  promethazine (PHENERGAN) 25 MG tablet TAKE 1 TABLET (25 MG TOTAL) BY MOUTH EVERY 8 (EIGHT) HOURS AS NEEDED FOR NAUSEA OR VOMITING. 01/03/20   Breeback, Jade L, PA-C  valACYclovir (VALTREX) 1000 MG tablet TAKE 1 TABLET BY MOUTH EVERY DAY 01/08/20   Breeback, Jade L, PA-C   No results found.  Positive ROS: All other systems have been reviewed and were otherwise negative with the exception of those mentioned in the HPI and as above.  Physical Exam: General: Alert, no acute distress Cardiovascular: No pedal edema Respiratory: No cyanosis, no use of accessory musculature GI: No organomegaly, abdomen is soft and non-tender Skin: No lesions in the area of chief complaint Neurologic: Sensation intact distally Psychiatric: Patient is competent for consent with normal mood and affect Lymphatic: No axillary or cervical lymphadenopathy  MUSCULOSKELETAL:  Right lower extremity is warm and well-perfused with no open wounds.  Neurovascularly intact.  Assessment: 1.  Right acute medial meniscus tear  2.  Right acute lateral meniscus tear.  3.  Right lateral tibia stress fracture  Plan: -Plan is to proceed today with arthroscopic intervention on the right knee with partial medial lateral meniscectomies and lateral tibial plateau subchondroplasty.  We again reviewed the risk and benefits of this procedure including but not limited to bleeding, infection, damage to surrounding nerves and vessels, stiffness, DVT, need for further surgery, persistent pain, and the risk of anesthesia.  She has provided informed consent.  -Plan for discharge home postoperatively.    Nicholes Stairs, MD Cell [phone]     02/04/2020 12:18 PM

## 2020-02-04 NOTE — Brief Op Note (Signed)
02/04/2020  1:16 PM  PATIENT:  [name]  44 y.o. 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   ANESTHESIA:   local and general  EBL:  5 cc  BLOOD ADMINISTERED:none  DRAINS: none   LOCAL MEDICATIONS USED:  MARCAINE     SPECIMEN:  No Specimen  DISPOSITION OF SPECIMEN:  N/A  COUNTS:  YES  TOURNIQUET:  * Missing tourniquet times found for documented tourniquets in log: [id number] *  DICTATION: .Note written in EPIC  PLAN OF CARE: Admit to inpatient   PATIENT DISPOSITION:  PACU - hemodynamically stable.   Delay start of Pharmacological VTE agent (>24hrs) due to surgical blood loss or risk of bleeding: not applicable

## 2020-02-04 NOTE — Anesthesia Procedure Notes (Signed)
Anesthesia Regional Block: Adductor canal block   Pre-Anesthetic Checklist: ,, timeout performed, Correct Patient, Correct Site, Correct Laterality, Correct Procedure, Correct Position, site marked, Risks and benefits discussed,  Surgical consent,  Pre-op evaluation,  At surgeon's request and post-op pain management  Laterality: Right  Prep: chloraprep       Needles:  Injection technique: Single-shot  Needle Type: Echogenic Stimulator Needle          Additional Needles:   Procedures:,,,, ultrasound used (permanent image in chart),,,,  Narrative:  Start time: 02/04/2020 11:24 AM End time: 02/04/2020 11:30 AM Injection made incrementally with aspirations every 5 mL.  Performed by: Personally  Anesthesiologist: Merlinda Frederick, MD  Additional Notes: A functioning IV was confirmed and monitors were applied.  Sterile prep and drape, hand hygiene and sterile gloves were used.  Negative aspiration and test dose prior to incremental administration of local anesthetic. The patient tolerated the procedure well.Ultrasound  guidance: relevant anatomy identified, needle position confirmed, local anesthetic spread visualized around nerve(s), vascular puncture avoided.  Image printed for medical record.

## 2020-02-04 NOTE — Anesthesia Procedure Notes (Signed)
Procedure Name: LMA Insertion Date/Time: 02/04/2020 12:39 PM Performed by: Genelle Bal, CRNA Pre-anesthesia Checklist: Patient identified, Emergency Drugs available, Suction available and Patient being monitored Patient Re-evaluated:Patient Re-evaluated prior to induction Oxygen Delivery Method: Circle system utilized Preoxygenation: Pre-oxygenation with 100% oxygen Induction Type: IV induction Ventilation: Mask ventilation without difficulty LMA: LMA inserted LMA Size: 4.0 Number of attempts: 1 Airway Equipment and Method: Bite block Placement Confirmation: positive ETCO2 Tube secured with: Tape Dental Injury: Teeth and Oropharynx as per pre-operative assessment

## 2020-02-04 NOTE — Discharge Instructions (Signed)
-Okay for full weightbearing as tolerated to the right lower extremity.  You should maintain postoperative bandages for 2 days.  You may remove your bandages on the third day and begin showering at that time.  Please do not submerge underwater until your follow-up appointment.  -You should elevate the leg with your "toes above nose."  As able.  You should also apply ice to the right knee for 20 to 30 minutes out of each hour that you are awake.  -For mild to moderate pain use Tylenol and Advil around-the-clock.  For breakthrough pain use Dilaudid as necessary.  -For the prevention of blood clots take an 81 mg aspirin once per day for 6 weeks.   -You should follow up with Dr. Stann Mainland in 2 weeks for postoperative care.    Post Anesthesia Home Care Instructions  Activity: Get plenty of rest for the remainder of the day. A responsible individual must stay with you for 24 hours following the procedure.  For the next 24 hours, DO NOT: -Drive a car -Paediatric nurse -Drink alcoholic beverages -Take any medication unless instructed by your physician -Make any legal decisions or sign important papers.  Meals: Start with liquid foods such as gelatin or soup. Progress to regular foods as tolerated. Avoid greasy, spicy, heavy foods. If nausea and/or vomiting occur, drink only clear liquids until the nausea and/or vomiting subsides. Call your physician if vomiting continues.  Special Instructions/Symptoms: Your throat may feel dry or sore from the anesthesia or the breathing tube placed in your throat during surgery. If this causes discomfort, gargle with warm salt water. The discomfort should disappear within 24 hours.  If you had a scopolamine patch placed behind your ear for the management of post- operative nausea and/or vomiting:  1. The medication in the patch is effective for 72 hours, after which it should be removed.  Wrap patch in a tissue and discard in the trash. Wash hands thoroughly  with soap and water. 2. You may remove the patch earlier than 72 hours if you experience unpleasant side effects which may include dry mouth, dizziness or visual disturbances. 3. Avoid touching the patch. Wash your hands with soap and water after contact with the patch.     NO IBUPROFEN PRODUCTS (MOTRIN, ADVIL) OR ALEVE UNTIL 7:15PM.   Regional Anesthesia Blocks  1. Numbness or the inability to move the "blocked" extremity may last from 3-48 hours after placement. The length of time depends on the medication injected and your individual response to the medication. If the numbness is not going away after 48 hours, call your surgeon.  2. The extremity that is blocked will need to be protected until the numbness is gone and the  Strength has returned. Because you cannot feel it, you will need to take extra care to avoid injury. Because it may be weak, you may have difficulty moving it or using it. You may not know what position it is in without looking at it while the block is in effect.  3. For blocks in the legs and feet, returning to weight bearing and walking needs to be done carefully. You will need to wait until the numbness is entirely gone and the strength has returned. You should be able to move your leg and foot normally before you try and bear weight or walk. You will need someone to be with you when you first try to ensure you do not fall and possibly risk injury.  4. Bruising and tenderness at  the needle site are common side effects and will resolve in a few days.  5. Persistent numbness or new problems with movement should be communicated to the surgeon or the [place] [phone] [place] [phone]).

## 2020-02-04 NOTE — Op Note (Addendum)
Surgeon(s): Nicholes Stairs, MD  Assistant:  Jonelle Sidle PA-C  Attestation;  Achilles Dunk was used throughout the proceed to assist with positioning patient, implanting the device and closing as well as transport to PACU.   ANESTHESIA:  general, and regional   FLUIDS: Per anesthesia record.    ESTIMATED BLOOD LOSS: minimal     PREOPERATIVE DIAGNOSES:  1. Right knee medial meniscus tear 2.  Right lateral tibial condyle insufficiency fracture 3.   Right lateral tibial condyle and medial plateau chondromalacia   POSTOPERATIVE DIAGNOSES:  same   PROCEDURES PERFORMED:  1. Right knee arthroscopically aided treatment of lateral tibial condyle insufficiency fracture with percutaneous internal fixation (subchondroplasty) 2. Right knee arthroscopy with arthroscopic partial medial meniscectomy 3.  Chondroplasty, right medial femoral condyle and lateral tibial plateau   Implant: Flowable calcium phosphate, 5 mL. Zimmer   DESCRIPTION OF PROCEDURE: The patient has a right knee medial meniscus tear. They have had pain that has been refractory to conservative management. Their preoperative MRI demonstrated subchondral bone marrow edema and insufficiency fractures of the lateral tibial plateau as well as the medial meniscus tear. Plans are to proceed with partial medial meniscectomy, internal fixation of subchondral insufficiency fractures with flowable calcium phosphate, and diagnostic arthroscopy with debridement as indicated. Full discussion held regarding risks benefits alternatives and complications related surgical intervention. Conservative care options reviewed. All questions answered.   The patient was identified in the preoperative holding area and the operative extremity was marked. The patient was brought to the operating room and transferred to operating table in a supine position. Satisfactory general anesthesia was induced by anesthesiology.     Standard anterolateral, anteromedial  arthroscopy portals were obtained. The anteromedial portal was obtained with a spinal needle for localization under direct visualization with subsequent diagnostic findings.    Anteromedial and anterolateral chambers: moderate synovitis. The synovitis was debrided with a 4.5 mm full radius shaver through both the anteromedial and lateral portals.    Suprapatellar pouch and gutters: mild synovitis with moderate loose debris  Patella chondral surface: Grade 0 Trochlear chondral surface: Grade 0 Patellofemoral tracking: level Medial meniscus: horizontal tearing of mid body  Medial femoral condyle flexion bearing surface: Grade 2 Medial femoral condyle extension bearing surface: Grade 0 Medial tibial plateau: Grade 3 adjacent to meniscus tear Anterior cruciate ligament:stable Posterior cruciate ligament:stable Lateral meniscus: no tear.   Lateral femoral condyle flexion bearing surface: Grade 0 Lateral femoral condyle extension bearing surface: Grade 0 Lateral tibial plateau: Grade 3-4 changes along the area of the stress fracture  All three compartments were with moderate loose cartilage debris.   Medial meniscus tear was debrided using biters and motorized shaver alternating until a stable remnant was left. Upon completion the probe was used to evaluate and assess the remaining meniscus which was gleaned to be stable.   Chondroplasty was achieved on the medial tibial condyle as well as the lateral tibial plateau, using a motorized shaver to debride the grade 3 and 4 unstable cartilage. Completion of the chondroplasty left with smooth stable surface. There was no full-thickness component noted.   Next we turned our attention to the internal fixation of the lateral tibial condyle. Arthroscopically we evaluated lateral tibial condyle noted there was no loose cartilage or debris surrounding the lesion and the fracture did not propagate to the joint surface. Using preoperative MRI we targeted the  delivery device to just under the subchondral density and in the lesion. This was achieved with intraoperative fluoroscopy. Once accurate  placement was noted on 2 views and confirmed we delivered 5 mL of flowable calcium phosphate into the posterior medial aspect of the lateral tibial plateau. We left the cannulas in place for approximately 8 minutes while the implant hardened. We removed the cannulas and again took 2 views of fluoroscopic pictures to confirm there was no extravasation outside of the bone. There was none noted.   After completion of synovectomy, diagnostic exam, and debridements as described, all compartments were checked and no residual debris remained. Hemostasis was achieved with the cautery wand. The portals were approximated with nylon suture. All excess fluid was expressed from the joint.  Xeroform sterile gauze dressings were applied followed by Ace bandage and ice pack.    There were no immediate competitions and all counts were correct.   DISPOSITION: The patient was awakened from general anesthetic, extubated, taken to the recovery room in medically stable condition, no apparent complications. The patient may be weightbearing as tolerated to the operative lower extremity with crutches.  Range of motion of right knee as tolerated.  They will use bid asa for DVT ppx, and return in 2 weeks for suture removal.   Nicholes Stairs

## 2020-02-04 NOTE — Progress Notes (Signed)
Assisted Dr. Bass with right, ultrasound guided, adductor canal block. Side rails up, monitors on throughout procedure. See vital signs in flow sheet. Tolerated Procedure well.  

## 2020-02-04 NOTE — Transfer of Care (Signed)
Immediate Anesthesia Transfer of Care Note  Patient: [name]  Procedure(s) Performed: Right knee arthroscopy with partial medial meniscectomy with lateral tibia subchondroplasty (Right Knee)  Patient Location: PACU  Anesthesia Type:GA combined with regional for post-op pain  Level of Consciousness: drowsy and patient cooperative  Airway & Oxygen Therapy: Patient Spontanous Breathing and Patient connected to face mask oxygen  Post-op Assessment: Report given to RN and Post -op Vital signs reviewed and stable  Post vital signs: Reviewed and stable  Last Vitals:  Vitals Value Taken Time  BP 101/75 02/04/20 1332  Temp    Pulse 70 02/04/20 1333  Resp 11 02/04/20 1333  SpO2 100 % 02/04/20 1333  Vitals shown include unvalidated device data.  Last Pain:  Vitals:   02/04/20 1058  TempSrc: Oral  PainSc: 6       Patients Stated Pain Goal: 5 (56/38/75 6433)  Complications: No complications documented.

## 2020-02-05 ENCOUNTER — Encounter (HOSPITAL_BASED_OUTPATIENT_CLINIC_OR_DEPARTMENT_OTHER): Payer: Self-pay | Admitting: Orthopedic Surgery

## 2020-02-06 ENCOUNTER — Other Ambulatory Visit: Payer: Self-pay | Admitting: Physician Assistant

## 2020-02-06 DIAGNOSIS — T753XXS Motion sickness, sequela: Secondary | ICD-10-CM

## 2020-02-12 DIAGNOSIS — M25612 Stiffness of left shoulder, not elsewhere classified: Secondary | ICD-10-CM | POA: Diagnosis not present

## 2020-02-12 DIAGNOSIS — R262 Difficulty in walking, not elsewhere classified: Secondary | ICD-10-CM | POA: Diagnosis not present

## 2020-02-12 DIAGNOSIS — R202 Paresthesia of skin: Secondary | ICD-10-CM | POA: Diagnosis not present

## 2020-02-12 DIAGNOSIS — M25512 Pain in left shoulder: Secondary | ICD-10-CM | POA: Diagnosis not present

## 2020-02-12 DIAGNOSIS — M25561 Pain in right knee: Secondary | ICD-10-CM | POA: Diagnosis not present

## 2020-02-12 DIAGNOSIS — Z4789 Encounter for other orthopedic aftercare: Secondary | ICD-10-CM | POA: Diagnosis not present

## 2020-02-12 DIAGNOSIS — M7502 Adhesive capsulitis of left shoulder: Secondary | ICD-10-CM | POA: Diagnosis not present

## 2020-02-12 DIAGNOSIS — M25661 Stiffness of right knee, not elsewhere classified: Secondary | ICD-10-CM | POA: Diagnosis not present

## 2020-02-12 DIAGNOSIS — M6281 Muscle weakness (generalized): Secondary | ICD-10-CM | POA: Diagnosis not present

## 2020-02-13 DIAGNOSIS — R202 Paresthesia of skin: Secondary | ICD-10-CM | POA: Diagnosis not present

## 2020-02-13 DIAGNOSIS — M6281 Muscle weakness (generalized): Secondary | ICD-10-CM | POA: Diagnosis not present

## 2020-02-13 DIAGNOSIS — M25661 Stiffness of right knee, not elsewhere classified: Secondary | ICD-10-CM | POA: Diagnosis not present

## 2020-02-13 DIAGNOSIS — M25561 Pain in right knee: Secondary | ICD-10-CM | POA: Diagnosis not present

## 2020-02-13 DIAGNOSIS — M25512 Pain in left shoulder: Secondary | ICD-10-CM | POA: Diagnosis not present

## 2020-02-13 DIAGNOSIS — M25612 Stiffness of left shoulder, not elsewhere classified: Secondary | ICD-10-CM | POA: Diagnosis not present

## 2020-02-13 DIAGNOSIS — R262 Difficulty in walking, not elsewhere classified: Secondary | ICD-10-CM | POA: Diagnosis not present

## 2020-02-13 DIAGNOSIS — M7502 Adhesive capsulitis of left shoulder: Secondary | ICD-10-CM | POA: Diagnosis not present

## 2020-02-13 DIAGNOSIS — Z4789 Encounter for other orthopedic aftercare: Secondary | ICD-10-CM | POA: Diagnosis not present

## 2020-02-18 ENCOUNTER — Other Ambulatory Visit: Payer: Self-pay | Admitting: Physician Assistant

## 2020-02-18 DIAGNOSIS — R4586 Emotional lability: Secondary | ICD-10-CM

## 2020-02-18 DIAGNOSIS — F330 Major depressive disorder, recurrent, mild: Secondary | ICD-10-CM

## 2020-02-18 DIAGNOSIS — F5101 Primary insomnia: Secondary | ICD-10-CM

## 2020-02-18 NOTE — Telephone Encounter (Signed)
Last written 07/08/2019 #30 with 5 refills Last appt 01/14/2020 (sick visit) Last normal follow up 08/20/2019

## 2020-02-18 NOTE — Telephone Encounter (Signed)
Need 6 month follow up. Will send month.

## 2020-02-18 NOTE — Telephone Encounter (Signed)
Please call patient to schedule appt 

## 2020-02-19 NOTE — Telephone Encounter (Signed)
30mo follow up appt scheduled for next Wednesday, 02/26/2020. AM

## 2020-02-26 ENCOUNTER — Encounter: Payer: Self-pay | Admitting: Physician Assistant

## 2020-02-26 ENCOUNTER — Other Ambulatory Visit: Payer: Self-pay | Admitting: Physician Assistant

## 2020-02-26 ENCOUNTER — Ambulatory Visit (INDEPENDENT_AMBULATORY_CARE_PROVIDER_SITE_OTHER): Payer: Medicare Other | Admitting: Physician Assistant

## 2020-02-26 VITALS — BP 117/65 | HR 92 | Temp 98.4°F | Ht 63.0 in | Wt 146.0 lb

## 2020-02-26 DIAGNOSIS — G2581 Restless legs syndrome: Secondary | ICD-10-CM

## 2020-02-26 DIAGNOSIS — I10 Essential (primary) hypertension: Secondary | ICD-10-CM | POA: Diagnosis not present

## 2020-02-26 DIAGNOSIS — M25661 Stiffness of right knee, not elsewhere classified: Secondary | ICD-10-CM | POA: Diagnosis not present

## 2020-02-26 DIAGNOSIS — F5101 Primary insomnia: Secondary | ICD-10-CM | POA: Diagnosis not present

## 2020-02-26 DIAGNOSIS — J40 Bronchitis, not specified as acute or chronic: Secondary | ICD-10-CM | POA: Diagnosis not present

## 2020-02-26 DIAGNOSIS — M6281 Muscle weakness (generalized): Secondary | ICD-10-CM | POA: Diagnosis not present

## 2020-02-26 DIAGNOSIS — Z4789 Encounter for other orthopedic aftercare: Secondary | ICD-10-CM | POA: Diagnosis not present

## 2020-02-26 DIAGNOSIS — M25561 Pain in right knee: Secondary | ICD-10-CM | POA: Diagnosis not present

## 2020-02-26 DIAGNOSIS — F411 Generalized anxiety disorder: Secondary | ICD-10-CM | POA: Diagnosis not present

## 2020-02-26 DIAGNOSIS — R4586 Emotional lability: Secondary | ICD-10-CM | POA: Diagnosis not present

## 2020-02-26 DIAGNOSIS — F330 Major depressive disorder, recurrent, mild: Secondary | ICD-10-CM | POA: Diagnosis not present

## 2020-02-26 DIAGNOSIS — M7502 Adhesive capsulitis of left shoulder: Secondary | ICD-10-CM | POA: Diagnosis not present

## 2020-02-26 DIAGNOSIS — R202 Paresthesia of skin: Secondary | ICD-10-CM | POA: Diagnosis not present

## 2020-02-26 DIAGNOSIS — M25512 Pain in left shoulder: Secondary | ICD-10-CM | POA: Diagnosis not present

## 2020-02-26 DIAGNOSIS — J329 Chronic sinusitis, unspecified: Secondary | ICD-10-CM | POA: Diagnosis not present

## 2020-02-26 DIAGNOSIS — M25612 Stiffness of left shoulder, not elsewhere classified: Secondary | ICD-10-CM | POA: Diagnosis not present

## 2020-02-26 DIAGNOSIS — R262 Difficulty in walking, not elsewhere classified: Secondary | ICD-10-CM | POA: Diagnosis not present

## 2020-02-26 MED ORDER — HYDROCHLOROTHIAZIDE 12.5 MG PO TABS: 12.5000 mg | ORAL_TABLET | Freq: Every day | ORAL | 1 refills | Status: DC | PRN

## 2020-02-26 MED ORDER — AZITHROMYCIN 250 MG PO TABS: ORAL_TABLET | ORAL | 0 refills | Status: DC

## 2020-02-26 MED ORDER — ARIPIPRAZOLE 5 MG PO TABS: 5.0000 mg | ORAL_TABLET | Freq: Every day | ORAL | 0 refills | Status: DC

## 2020-02-26 MED ORDER — BENZONATATE 200 MG PO CAPS: 200.0000 mg | ORAL_CAPSULE | Freq: Two times a day (BID) | ORAL | 0 refills | Status: DC | PRN

## 2020-02-26 MED ORDER — ROPINIROLE HCL 0.25 MG PO TABS: 0.2500 mg | ORAL_TABLET | Freq: Every day | ORAL | 1 refills | Status: DC

## 2020-02-26 MED ORDER — HYDROXYZINE HCL 50 MG PO TABS: 50.0000 mg | ORAL_TABLET | Freq: Three times a day (TID) | ORAL | 1 refills | Status: DC | PRN

## 2020-02-26 MED ORDER — ZOLPIDEM TARTRATE 10 MG PO TABS: 10.0000 mg | ORAL_TABLET | Freq: Every day | ORAL | 5 refills | Status: DC

## 2020-02-26 MED ORDER — METHYLPREDNISOLONE SODIUM SUCC 125 MG IJ SOLR
125.0000 mg | Freq: Once | INTRAMUSCULAR | Status: AC
  Administered 2020-02-26: 125 mg via INTRAMUSCULAR

## 2020-02-26 NOTE — Progress Notes (Signed)
Subjective:    Patient ID: [name], female    DOB: 06-Nov-1975, 44 y.o.   MRN: [id number]  HPI  Patient is a 44 year old female with insomnia, depression, anxiety, HTN and ongoing chronic pain.  Pt is here for 6 month follow up and medication refill.   2 weeks ago she did have right knee chondroplasty.  Same surgeon is going to do surgery on her left shoulder in December.  She is very optimistic that this will help her with some pain relief.  She does have a new pain doctor Dr. Asencion Partridge at [facility].  She likes her a lot.  She feels like she is being listened to.  She is on oxycodone as needed.  Patient did not feel like BuSpar helped at all with her anxiety.  She does feel like the Abilify is helping a lot.  She would like to increase dose.  She denies any side effects.  She is not using Klonopin since on oxycodone and on pain contract.  She has taken the hydroxyzine as needed for anxiety and itching.  Patient continues to use Ambien for insomnia.  It is working well with no concerns or complaints.  Restless leg treated with Requip and doing well.  For the last week she has had some sinus pressure and congestion.  She denies any fever, chills, loss of smell or taste, GI symptoms.  She had Covid about a month ago.  She does not have body aches.  She does feel like it is going into her chest.  She has some chest tightness and wheezing.  Her cough is keeping her up at night.  She is taking over-the-counter medications with little relief.     .. Active Ambulatory Problems    Diagnosis Date Noted  . Anxiety state 07/06/2007  . Smoker 02/05/2010  . Depressed 06/26/2008  . INTERSTITIAL CYSTITIS 02/05/2010  . Insomnia 07/06/2007  . FATIGUE 07/02/2009  . HEADACHE 07/06/2007  . TROCHANTERIC BURSITIS 06/21/2010  . Tick-borne disease 08/09/2010  . PTSD (post-traumatic stress disorder) 12/14/2012  . Weight gain 11/29/2013  . Greater trochanteric bursitis 11/29/2013  .  Constipation due to opioid therapy 01/17/2014  . Left shoulder pain 04/02/2014  . Carpal tunnel syndrome of left wrist 05/05/2014  . Myofascial pain syndrome 05/29/2014  . Paresthesia 06/03/2014  . Endometriosis 07/20/2014  . Tachycardia 08/13/2014  . S/P cervical spinal fusion 10/28/2014  . Therapeutic opioid induced constipation 12/16/2014  . Vasomotor flushing 06/15/2015  . GAD (generalized anxiety disorder) 06/15/2015  . Fixed pupils 08/31/2015  . Dysuria 08/31/2015  . Depression 08/31/2015  . Primary osteoarthritis of left first metacarpophalangeal joint 04/04/2016  . Palpitations 04/14/2016  . Balance problems 07/19/2016  . Dizziness 07/19/2016  . Primary osteoarthritis of right knee 09/05/2016  . Trigger thumb, left thumb 10/06/2016  . Chronic pain syndrome 01/08/2017  . 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  . First degree ankle sprain, left, initial encounter 08/29/2017  . Hypertension 10/06/2017  . Hx of cold sores 10/06/2017  . Asthma 01/29/2018  . Chronic neck pain 01/29/2018  . Traumatic ecchymosis of left foot 10/15/2018  . Mild episode of recurrent major depressive disorder ([place]) 10/30/2018  . Dog bite 02/15/2019  . Leukocytes in urine 03/04/2019  . Other microscopic hematuria 03/04/2019  . Left ovarian cyst 03/04/2019  . RLS (restless legs syndrome) 07/08/2019  . Mood changes 08/25/2019  . Pes anserine bursitis 09/03/2019  . Nodule right fifth PIP of hand 09/03/2019  . Impingement syndrome, shoulder, left  12/30/2019  . COVID-19 virus infection 01/14/2020   Resolved Ambulatory Problems    Diagnosis Date Noted  . RHINITIS 10/13/2009  . Polyuria 11/04/2010  . Degenerative disc disease, cervical 10/31/2013  . Cervical disc disorder with radiculopathy of cervical region 11/29/2013  . Itching 11/29/2013  . Radiculitis of left cervical region 01/17/2014  . Nausea with vomiting 11/25/2014  . Dysmenorrhea 05/11/2015  . Cervical disc disorder with radiculopathy of cervical  region 11/11/2015  . Acute upper respiratory infection 04/04/2016  . Localized primary carpometacarpal osteoarthritis, left 04/04/2016  . Cervical post-laminectomy syndrome 07/08/2019   Past Medical History:  Diagnosis Date  . Acute medial meniscus tear of right knee   . Anxiety   . Arthritis   . Complication of anesthesia   . COVID-19 01/15/2020  . Dysrhythmia   . Elevated heart rate and blood pressure   . Family history of adverse reaction to anesthesia   . Fibromyalgia   . GERD (gastroesophageal reflux disease)   . IC (interstitial cystitis)   . Myofascial pain syndrome, cervical   . Neck pain   . Ovarian cyst   . PONV (postoperative nausea and vomiting)       Review of Systems See HPI.     Objective:   Physical Exam Vitals reviewed.  Constitutional:      Appearance: Normal appearance.  HENT:     Head: Normocephalic.  Cardiovascular:     Rate and Rhythm: Normal rate and regular rhythm.     Pulses: Normal pulses.  Pulmonary:     Effort: Pulmonary effort is normal.     Breath sounds: Normal breath sounds.  Musculoskeletal:     Comments: Right knee brace.   Neurological:     General: No focal deficit present.     Mental Status: She is oriented to person, place, and time.  Psychiatric:        Mood and Affect: Mood normal.           Assessment & Plan:  .Marland KitchenTeila was seen today for follow-up.  Diagnoses and all orders for this visit:  Primary insomnia -     zolpidem (AMBIEN) 10 MG tablet; Take 1 tablet (10 mg total) by mouth at bedtime.  GAD (generalized anxiety disorder) -     hydrOXYzine (ATARAX/VISTARIL) 50 MG tablet; Take 1 tablet (50 mg total) by mouth 3 (three) times daily as needed.  Anxiety state -     hydrOXYzine (ATARAX/VISTARIL) 50 MG tablet; Take 1 tablet (50 mg total) by mouth 3 (three) times daily as needed.  RLS (restless legs syndrome) -     rOPINIRole (REQUIP) 0.25 MG tablet; Take 1-3 tablets (0.25-0.75 mg total) by mouth at  bedtime.  Essential hypertension -     hydrochlorothiazide (HYDRODIURIL) 12.5 MG tablet; Take 1 tablet (12.5 mg total) by mouth daily as needed.  Mild episode of recurrent major depressive disorder (HCC) -     ARIPiprazole (ABILIFY) 5 MG tablet; Take 1 tablet (5 mg total) by mouth daily.  Mood changes  Sinobronchitis -     azithromycin (ZITHROMAX Z-PAK) 250 MG tablet; Take 2 tablets (500 mg) on  Day 1,  followed by 1 tablet (250 mg) once daily on Days 2 through 5. -     benzonatate (TESSALON) 200 MG capsule; Take 1 capsule (200 mg total) by mouth 2 (two) times daily as needed for cough. -     methylPREDNISolone sodium succinate (SOLU-MEDROL) 125 mg/2 mL injection 125 mg   Medication refills given  today.  Hopefully these upcoming Ortho surgeries will help her significantly with her pain relief.  Blood pressure to goal refilled medications today.  Increase Abilify from 2 mg to 5 mg.  Took BuSpar off list.  Follow-up as needed or in 6 months.  Treated for sinobronchitis with Z-Pak, Tessalon, IM solumedrol in office. Follow up as needed or if symptoms persist.

## 2020-03-02 ENCOUNTER — Other Ambulatory Visit: Payer: Self-pay | Admitting: Physician Assistant

## 2020-03-02 DIAGNOSIS — K219 Gastro-esophageal reflux disease without esophagitis: Secondary | ICD-10-CM

## 2020-03-02 DIAGNOSIS — L237 Allergic contact dermatitis due to plants, except food: Secondary | ICD-10-CM

## 2020-03-02 NOTE — Telephone Encounter (Signed)
Pt is requesting med refill for clobetasol. Rx not listed in active med list.

## 2020-03-03 ENCOUNTER — Other Ambulatory Visit: Payer: Self-pay | Admitting: Physician Assistant

## 2020-03-29 ENCOUNTER — Other Ambulatory Visit: Payer: Self-pay | Admitting: Sports Medicine

## 2020-03-29 ENCOUNTER — Other Ambulatory Visit: Payer: Self-pay | Admitting: Physician Assistant

## 2020-03-29 DIAGNOSIS — J40 Bronchitis, not specified as acute or chronic: Secondary | ICD-10-CM

## 2020-03-29 DIAGNOSIS — L989 Disorder of the skin and subcutaneous tissue, unspecified: Secondary | ICD-10-CM

## 2020-03-29 DIAGNOSIS — J329 Chronic sinusitis, unspecified: Secondary | ICD-10-CM

## 2020-03-30 ENCOUNTER — Telehealth (INDEPENDENT_AMBULATORY_CARE_PROVIDER_SITE_OTHER): Payer: Medicare Other | Admitting: Physician Assistant

## 2020-03-30 VITALS — BP 101/74 | HR 113 | Temp 102.7°F | Ht 63.0 in | Wt 141.0 lb

## 2020-03-30 DIAGNOSIS — R000 Tachycardia, unspecified: Secondary | ICD-10-CM | POA: Diagnosis not present

## 2020-03-30 DIAGNOSIS — R509 Fever, unspecified: Secondary | ICD-10-CM

## 2020-03-30 DIAGNOSIS — R059 Cough, unspecified: Secondary | ICD-10-CM

## 2020-03-30 MED ORDER — BENZONATATE 200 MG PO CAPS: 200.0000 mg | ORAL_CAPSULE | Freq: Two times a day (BID) | ORAL | 0 refills | Status: DC | PRN

## 2020-03-30 MED ORDER — HYDROCOD POLST-CPM POLST ER 10-8 MG/5ML PO SUER: 5.0000 mL | Freq: Two times a day (BID) | ORAL | 0 refills | Status: DC | PRN

## 2020-03-30 NOTE — Progress Notes (Signed)
Started Thursday pm Cough  Headache Fatigue Fever Congestion Body aches chills  No sore throat, no loss of taste/smell, diarrhea  Kids - coughing Monday, no other symptoms, no sick contacts that she knows of  Has not taken anything OTC for cold symptoms, taking Motrin for fever, fever worse at night

## 2020-03-30 NOTE — Progress Notes (Signed)
Patient ID: [name], female   DOB: November 26, 1975, 44 y.o.   MRN: [id number] .Michele KitchenVirtual Visit via Video Note  I connected with [name] on 03/31/2020 at  3:20 PM EST by a video enabled telemedicine application and verified that I am speaking with the correct person using two identifiers.  Location: Patient: home Provider: clinic  Patient, myself, and PA student Janett Billow gagnon present for encounter.    I discussed the limitations of evaluation and management by telemedicine and the availability of in person appointments. The patient expressed understanding and agreed to proceed.  History of Present Illness: Patient is a 44 year old female who presents via video to the clinic to discuss fever, chills, body aches, cough since Thursday.  For the last 4 to 5 days she has been very tired and battled symptoms and fevers of 102 to 104.  She denies any shortness of breath that is out of the ordinary for her or calf tenderness or swelling.  She did have surgery in October for her right knee.  She does mention a rash on her upper extremities that she use a topical steroid on for the last 5 weeks that recently went away before the symptoms started.  Patient denies any loss of smell or taste, diarrhea, urinary symptoms.  She is nauseated and having some dry heaving.  On 1027 in the office she was seen for sinobronchitis.  She was treated with cough suppressant, antibiotic.    Active Ambulatory Problems    Diagnosis Date Noted  . Anxiety state 07/06/2007  . Smoker 02/05/2010  . Depressed 06/26/2008  . INTERSTITIAL CYSTITIS 02/05/2010  . Insomnia 07/06/2007  . FATIGUE 07/02/2009  . HEADACHE 07/06/2007  . TROCHANTERIC BURSITIS 06/21/2010  . Tick-borne disease 08/09/2010  . PTSD (post-traumatic stress disorder) 12/14/2012  . Weight gain 11/29/2013  . Greater trochanteric bursitis 11/29/2013  . Constipation due to opioid therapy 01/17/2014  . Left shoulder pain 04/02/2014  .  Carpal tunnel syndrome of left wrist 05/05/2014  . Myofascial pain syndrome 05/29/2014  . Paresthesia 06/03/2014  . Endometriosis 07/20/2014  . Tachycardia 08/13/2014  . S/P cervical spinal fusion 10/28/2014  . Therapeutic opioid induced constipation 12/16/2014  . Vasomotor flushing 06/15/2015  . GAD (generalized anxiety disorder) 06/15/2015  . Fixed pupils 08/31/2015  . Dysuria 08/31/2015  . Depression 08/31/2015  . Primary osteoarthritis of left first metacarpophalangeal joint 04/04/2016  . Palpitations 04/14/2016  . Balance problems 07/19/2016  . Dizziness 07/19/2016  . Primary osteoarthritis of right knee 09/05/2016  . Trigger thumb, left thumb 10/06/2016  . Chronic pain syndrome 01/08/2017  . 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  . First degree ankle sprain, left, initial encounter 08/29/2017  . Hypertension 10/06/2017  . Hx of cold sores 10/06/2017  . Asthma 01/29/2018  . Chronic neck pain 01/29/2018  . Traumatic ecchymosis of left foot 10/15/2018  . Mild episode of recurrent major depressive disorder ([place]) 10/30/2018  . Dog bite 02/15/2019  . Leukocytes in urine 03/04/2019  . Other microscopic hematuria 03/04/2019  . Left ovarian cyst 03/04/2019  . RLS (restless legs syndrome) 07/08/2019  . Mood changes 08/25/2019  . Pes anserine bursitis 09/03/2019  . Nodule right fifth PIP of hand 09/03/2019  . Impingement syndrome, shoulder, left 12/30/2019  . COVID-19 virus infection 01/14/2020   Resolved Ambulatory Problems    Diagnosis Date Noted  . RHINITIS 10/13/2009  . Polyuria 11/04/2010  . Degenerative disc disease, cervical 10/31/2013  . Cervical disc disorder with radiculopathy of cervical region 11/29/2013  .  Itching 11/29/2013  . Radiculitis of left cervical region 01/17/2014  . Nausea with vomiting 11/25/2014  . Dysmenorrhea 05/11/2015  . Cervical disc disorder with radiculopathy of cervical region 11/11/2015  . Acute upper respiratory infection 04/04/2016  . Localized  primary carpometacarpal osteoarthritis, left 04/04/2016  . Cervical post-laminectomy syndrome 07/08/2019   Past Medical History:  Diagnosis Date  . Acute medial meniscus tear of right knee   . Anxiety   . Arthritis   . Complication of anesthesia   . COVID-19 01/15/2020  . Dysrhythmia   . Elevated heart rate and blood pressure   . Family history of adverse reaction to anesthesia   . Fibromyalgia   . GERD (gastroesophageal reflux disease)   . IC (interstitial cystitis)   . Myofascial pain syndrome, cervical   . Neck pain   . Ovarian cyst   . PONV (postoperative nausea and vomiting)    Reviewed med, allergy, problem list.      Observations/Objective: No acute distress.  Pt laying in bed while visit underway.  Normal breathing.  Dry cough noted.  No wheezing.   .. Today's Vitals   03/30/20 1524  BP: 101/74  Pulse: (!) 113  Temp: (!) 102.7 F (39.3 C)  TempSrc: Oral  Weight: 141 lb (64 kg)  Height: 5\' 3"  (1.6 m)   Body mass index is 24.98 kg/m.    Assessment and Plan: .Michele KitchenMikiala was seen today for cough and fever.  Diagnoses and all orders for this visit:  Cough -     DG Chest 2 View -     benzonatate (TESSALON) 200 MG capsule; Take 1 capsule (200 mg total) by mouth 2 (two) times daily as needed for cough. -     chlorpheniramine-HYDROcodone ([place]) 10-8 MG/5ML SUER; Take 5 mLs by mouth every 12 (twelve) hours as needed.  Fever and chills -     DG Chest 2 View  Tachycardia   High fever is always concerning for an bacterial infection somewhere. Will get CXR. No urinary symptoms. Please come in for testing for strep, flu, covid. Pt recently had covid almost a month ago but want to test to be sure. Pt is outside window for treating the flu but if her kids get sick can be more on top of how to treat. Rest and hydrate. Alternate ibuprofen and tylenol for fever and aches. quarantine until after test results. Tessalon pearl and tussionex given for cough at  patients request. Follow up with any problems breathing in ED.    Follow Up Instructions:    I discussed the assessment and treatment plan with the patient. The patient was provided an opportunity to ask questions and all were answered. The patient agreed with the plan and demonstrated an understanding of the instructions.   The patient was advised to call back or seek an in-person evaluation if the symptoms worsen or if the condition fails to improve as anticipated.    Iran Planas, PA-C

## 2020-03-31 ENCOUNTER — Encounter: Payer: Medicare Other | Admitting: Physician Assistant

## 2020-03-31 ENCOUNTER — Encounter: Payer: Self-pay | Admitting: Physician Assistant

## 2020-03-31 NOTE — Progress Notes (Signed)
Established Patient Office Visit  Subjective:  Patient ID: [name], female    DOB: 29-May-1975  Age: 44 y.o. MRN: [id number]  CC: No chief complaint on file.   HPI [name] no showed appointment.   Past Medical History:  Diagnosis Date  . Acute medial meniscus tear of right knee   . Anxiety   . Arthritis   . Asthma   . Complication of anesthesia    likes iv med and scopolamine patch also, limited neck motion  . COVID-19 01/15/2020   antibody infusion given 01-16-2020 no symptoms since infusion  . Dysrhythmia    tachycardia  . Elevated heart rate and blood pressure    takes toprol for increased heart rate  . Family history of adverse reaction to anesthesia    mother got blood clots after anesthesia , mother on heparin now  . Fibromyalgia   . GERD (gastroesophageal reflux disease)   . Headache(784.0)    migraines  . IC (interstitial cystitis)   . Insomnia   . Myofascial pain syndrome, cervical   . Neck pain    C 2 and C 3 slipped disc has neck pain with, limited neck motion  . Ovarian cyst    x 2now  . PONV (postoperative nausea and vomiting)     Past Surgical History:  Procedure Laterality Date  . CARPAL TUNNEL RELEASE Left 06/05/2014   Procedure: LEFT CARPAL TUNNEL RELEASE;  Surgeon: Daryll Brod, MD;  Location: [facility];  Service: Orthopedics;  Laterality: Left;  . CARPAL TUNNEL RELEASE Right 08/25/2014   Procedure: RIGHT CARPAL TUNNEL RELEASE;  Surgeon: Daryll Brod, MD;  Location: [facility];  Service: Orthopedics;  Laterality: Right;  . CARPAL TUNNEL RELEASE Left 07/14/2015   Procedure: LEFT CARPAL TUNNEL RELEASE;  Surgeon: Daryll Brod, MD;  Location: [facility];  Service: Orthopedics;  Laterality: Left;  . CERVICAL SPINE SURGERY  11/2013   dr Joya Salm  . CESAREAN SECTION     x2  . DILATION AND EVACUATION N/A 05/07/2013   Procedure: DILATATION AND EVACUATION with tissue sent for chromosome  analysis;  Surgeon: Lovenia Kim, MD;  Location: [facility];  Service: Gynecology;  Laterality: N/A;  . EPIDURAL STEROID INJECTION  03/20/15  . KNEE ARTHROSCOPY WITH MEDIAL MENISECTOMY Right 04/09/2019   Procedure: Right knee arthroscopy fat pad resection with partial medial meniscectomy;  Surgeon: Nicholes Stairs, MD;  Location: [facility];  Service: Orthopedics;  Laterality: Right;  60 mins  . KNEE ARTHROSCOPY WITH SUBCHONDROPLASTY Right 02/04/2020   Procedure: Right knee arthroscopy with partial medial meniscectomy with lateral tibia subchondroplasty;  Surgeon: Nicholes Stairs, MD;  Location: [facility];  Service: Orthopedics;  Laterality: Right;  75 mins  . OVARIAN CYST REMOVAL    . PELVIC LAPAROSCOPY  2003   endometriosis  . POSTERIOR CERVICAL FUSION/FORAMINOTOMY N/A 10/28/2014   Procedure: Cervical Four-Cervical Seven Posterior cervical fusion with lateral mass fixation;  Surgeon: Eustace Moore, MD;  Location: [facility];  Service: Neurosurgery;  Laterality: N/A;  posterior  . ROBOTIC ASSISTED LAPAROSCOPIC LYSIS OF ADHESION N/A 07/18/2014   Procedure: ROBOTIC ASSISTED LAPAROSCOPIC  EXCISION POSTERIOR  UTERINE WALL MASS; EXCISION RIGHT MESSALEINGEAL MASS, EXCISION AND ABLATION CULDASAC ENDOMETRIOSIS;  Surgeon: Brien Few, MD;  Location: [facility];  Service: Gynecology;  Laterality: N/A;  . ROBOTIC ASSISTED TOTAL HYSTERECTOMY WITH SALPINGECTOMY Bilateral 05/11/2015   Procedure: ROBOTIC ASSISTED TOTAL HYSTERECTOMY WITH BILATERAL SALPINGECTOMY;  Surgeon: Delfino Lovett  Ronita Hipps, MD;  Location: [facility];  Service: Gynecology;  Laterality: Bilateral;    Family History  Problem Relation Age of Onset  . Heart attack Father   . ADD / ADHD Brother   . Depression Brother   . Depression Cousin     Social History   Socioeconomic History  . Marital status: Divorced    Spouse name: Not on file  . Number of children: 2  . Years of education: 49  . Highest education  level: Associate degree: occupational, Hotel manager, or vocational program  Occupational History  . 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 Smoking status: Current Every Day Smoker    Packs/day: 0.50    Years: 18.00    Pack years: 9.00    Types: Cigarettes  . Smokeless tobacco: Never Used  Vaping Use  . Vaping Use: Never used  Substance and Sexual Activity  . Alcohol use: No    Alcohol/week: 0.0 standard drinks  . Drug use: No  . Sexual activity: Yes    Birth control/protection: Surgical  Other Topics Concern  . Not on file  Social History Narrative   Going through separation.  Children Sage and Luisa Hart.  Stays in a lot of pain all the time   Social Determinants of Health   Financial Resource Strain:   . Difficulty of Paying Living Expenses: Not on file  Food Insecurity:   . Worried About Charity fundraiser in the Last Year: Not on file  . Ran Out of Food in the Last Year: Not on file  Transportation Needs:   . Lack of Transportation (Medical): Not on file  . Lack of Transportation (Non-Medical): Not on file  Physical Activity:   . Days of Exercise per Week: Not on file  . Minutes of Exercise per Session: Not on file  Stress:   . Feeling of Stress : Not on file  Social Connections:   . Frequency of Communication with Friends and Family: Not on file  . Frequency of Social Gatherings with Friends and Family: Not on file  . Attends Religious Services: Not on file  . Active Member of Clubs or Organizations: Not on file  . Attends Archivist Meetings: Not on file  . Marital Status: Not on file  Intimate Partner Violence:   . Fear of Current or Ex-Partner: Not on file  . Emotionally Abused: Not on file  . Physically Abused: Not on file  . Sexually Abused: Not on file    Outpatient Medications Prior to Visit  Medication Sig Dispense Refill  . ARIPiprazole (ABILIFY) 5 MG tablet Take 1 tablet (5 mg total) by mouth  daily. 90 tablet 0  . benzonatate (TESSALON) 200 MG capsule Take 1 capsule (200 mg total) by mouth 2 (two) times daily as needed for cough. 20 capsule 0  . BREO ELLIPTA 100-25 MCG/INH AEPB INHALE 1 PUFF BY MOUTH EVERY DAY 60 each 11  . chlorpheniramine-HYDROcodone (TUSSIONEX) 10-8 MG/5ML SUER Take 5 mLs by mouth every 12 (twelve) hours as needed. 70 mL 0  . clobetasol cream (TEMOVATE) 0.05 % APPLY TO AFFECTED AREA TWICE A DAY 60 g 0  . hydrochlorothiazide (HYDRODIURIL) 12.5 MG tablet Take 1 tablet (12.5 mg total) by mouth daily as needed. 90 tablet 1  . hydrOXYzine (ATARAX/VISTARIL) 50 MG tablet Take 1 tablet (50 mg total) by mouth 3 (three) times daily as needed. 270 tablet 1  . metoprolol succinate (  TOPROL-XL) 100 MG 24 hr tablet Take 1 tablet (100 mg total) by mouth daily. 90 tablet 3  . ondansetron (ZOFRAN ODT) 4 MG disintegrating tablet Take 1 tablet (4 mg total) by mouth every 8 (eight) hours as needed for nausea or vomiting. 20 tablet 0  . oxyCODONE (OXY IR/ROXICODONE) 5 MG immediate release tablet Take by mouth.    . pantoprazole (PROTONIX) 40 MG tablet TAKE 1 TABLET BY MOUTH EVERY DAY 90 tablet 1  . pregabalin (LYRICA) 200 MG capsule Take 100 mg by mouth 3 (three) times daily. 100 mg tid    . promethazine (PHENERGAN) 25 MG tablet TAKE 1 TABLET (25 MG TOTAL) BY MOUTH EVERY 8 (EIGHT) HOURS AS NEEDED FOR NAUSEA OR VOMITING. 30 tablet 5  . rizatriptan (MAXALT) 10 MG tablet TAKE 1 TABLET AS NEEDED FOR MIGRAINE, MAY REPEAT 2 HRS PRN 10 tablet 5  . rOPINIRole (REQUIP) 0.25 MG tablet Take 1-3 tablets (0.25-0.75 mg total) by mouth at bedtime. 270 tablet 1  . triamcinolone cream (KENALOG) 0.5 % APPLY 1 APPLICATION TOPICALLY 2 (TWO) TIMES DAILY. TO AFFECTED AREA ON PINKY FINGER. 30 g 3  . valACYclovir (VALTREX) 1000 MG tablet TAKE 1 TABLET BY MOUTH EVERY DAY 90 tablet 1  . VENTOLIN HFA 108 (90 Base) MCG/ACT inhaler INHALE 2 PUFFS BY MOUTH EVERY 6 HOURS AS NEEDED 18 each 0  . zolpidem (AMBIEN) 10 MG  tablet Take 1 tablet (10 mg total) by mouth at bedtime. 30 tablet 5   No facility-administered medications prior to visit.    Allergies  Allergen Reactions  . Gadolinium Derivatives Hives, Itching and Other (See Comments)    After MRI with Mult[facility] finished, patient had redness and itching on chest, tightness in throat.  Patient went to ED to be monitored (04/23/14).had mri since takes benadryl before, tolerates well  . Prednisone Other (See Comments)    Pain all over, "it triggers my myofascial pain syndrome"   . Amitriptyline Other (See Comments)    Gained weight and does not want to be on.   . Eggs Or Egg-Derived Products Rash  . Hydrocodone-Acetaminophen Itching    Tolerates with benadryl (no reaction to oxycodone)  . Lunesta [Eszopiclone] Other (See Comments)    Metallic taste in mouth  . Tramadol Itching    ROS Review of Systems    Objective:    Physical Exam  LMP 05/10/2015 (LMP Unknown)  Wt Readings from Last 3 Encounters:  03/30/20 141 lb (64 kg)  02/26/20 146 lb (66.2 kg)  02/04/20 141 lb 4.8 oz (64.1 kg)     Health Maintenance Due  Topic Date Due  . COVID-19 Vaccine (1) Never done    There are no preventive care reminders to display for this patient.  Lab Results  Component Value Date   TSH 1.92 10/02/2017   Lab Results  Component Value Date   WBC 11.4 (H) 10/02/2017   HGB 15.0 02/04/2020   HCT 44.0 02/04/2020   MCV 93.9 10/02/2017   PLT 350 10/02/2017   Lab Results  Component Value Date   NA 138 02/04/2020   K 3.2 (L) 02/04/2020   CO2 27 10/02/2017   GLUCOSE 93 02/04/2020   BUN 14 02/04/2020   CREATININE 0.80 02/04/2020   BILITOT 0.6 10/02/2017   ALKPHOS 60 12/16/2014   AST 17 10/02/2017   ALT 12 10/02/2017   PROT 6.9 10/02/2017   ALBUMIN 4.4 12/16/2014   CALCIUM 9.8 10/02/2017   ANIONGAP 7 05/12/2015   Lab Results  Component Value Date   CHOL 138 08/22/2008   Lab Results  Component Value Date   HDL 53 08/22/2008   Lab  Results  Component Value Date   LDLCALC 68 08/22/2008   Lab Results  Component Value Date   TRIG 85 08/22/2008   Lab Results  Component Value Date   CHOLHDL 2.6 Ratio 08/22/2008   No results found for: HGBA1C    Assessment & Plan:  Cough and fever - Sent out Covid test. POCT for flu and strep in office.    Problem List Items Addressed This Visit    None    Visit Diagnoses    Cough    -  Primary   Fever and chills          No orders of the defined types were placed in this encounter.   Follow-up: No follow-ups on file.    Lavell Luster, CMA This encounter was created in error - please disregard.

## 2020-04-16 ENCOUNTER — Other Ambulatory Visit: Payer: Self-pay | Admitting: Physician Assistant

## 2020-04-16 DIAGNOSIS — L237 Allergic contact dermatitis due to plants, except food: Secondary | ICD-10-CM

## 2020-04-16 DIAGNOSIS — S83241D Other tear of medial meniscus, current injury, right knee, subsequent encounter: Secondary | ICD-10-CM | POA: Diagnosis not present

## 2020-04-16 DIAGNOSIS — Z4789 Encounter for other orthopedic aftercare: Secondary | ICD-10-CM | POA: Diagnosis not present

## 2020-04-16 DIAGNOSIS — T887XXA Unspecified adverse effect of drug or medicament, initial encounter: Secondary | ICD-10-CM

## 2020-04-30 DIAGNOSIS — M5412 Radiculopathy, cervical region: Secondary | ICD-10-CM | POA: Diagnosis not present

## 2020-05-04 ENCOUNTER — Telehealth (INDEPENDENT_AMBULATORY_CARE_PROVIDER_SITE_OTHER): Payer: Medicare Other | Admitting: Sports Medicine

## 2020-05-04 ENCOUNTER — Telehealth: Payer: Self-pay | Admitting: Physician Assistant

## 2020-05-04 DIAGNOSIS — M79671 Pain in right foot: Secondary | ICD-10-CM

## 2020-05-04 NOTE — Assessment & Plan Note (Signed)
[name] is a 45 year old female, it sounds like she had a subchondral insufficiency fracture, she did have PMMA injection in the subchondral bone. Unfortunately she started to have pain for the past several days in her right heel, plantar aspect, worse with the first few steps in the morning, this all sounds like plantar fasciitis. She will return in a nurse visit may be tomorrow for a cam boot, and I am going to email her the plantar fascia rehab exercises, she will wear the boot for 2 weeks and come to see me in person at which point we will do an injection if insufficiently better.

## 2020-05-04 NOTE — Telephone Encounter (Signed)
I called pt 3x trying to schedule her on Nurse schedule on 05/05/20 for a boot. If she calls back, it is okay to schedule on Nurse schedule PER Dr T.  Her Voicemail did not come on

## 2020-05-04 NOTE — Progress Notes (Signed)
   Virtual Visit via Telephone   I connected with  [name]  on 05/04/20 by telephone/telehealth and verified that I am speaking with the correct person using two identifiers.   I discussed the limitations, risks, security and privacy concerns of performing an evaluation and management service by telephone, including the higher likelihood of inaccurate diagnosis and treatment, and the availability of in person appointments.  We also discussed the likely need of an additional face to face encounter for complete and high quality delivery of care.  I also discussed with the patient that there may be a patient responsible charge related to this service. The patient expressed understanding and wishes to proceed.  Provider location is in medical facility. Patient location is at their home, different from provider location. People involved in care of the patient during this telehealth encounter were myself, my nurse/medical assistant, and my front office/scheduling team member.  Review of Systems: No fevers, chills, night sweats, weight loss, chest pain, or shortness of breath.   Objective Findings:    General: Speaking full sentences, no audible heavy breathing.  Sounds alert and appropriately interactive.    Independent interpretation of tests performed by another provider:   None.  Brief History, Exam, Impression, and Recommendations:    Intractable right heel pain [name] is a 45 year old female, it sounds like she had a subchondral insufficiency fracture, she did have PMMA injection in the subchondral bone. Unfortunately she started to have pain for the past several days in her right heel, plantar aspect, worse with the first few steps in the morning, this all sounds like plantar fasciitis. She will return in a nurse visit may be tomorrow for a cam boot, and I am going to email her the plantar fascia rehab exercises, she will wear the boot for 2 weeks and come to see me in  person at which point we will do an injection if insufficiently better.   I discussed the above assessment and treatment plan with the patient. The patient was provided an opportunity to ask questions and all were answered. The patient agreed with the plan and demonstrated an understanding of the instructions.   The patient was advised to call back or seek an in-person evaluation if the symptoms worsen or if the condition fails to improve as anticipated.   I provided 30 minutes of face to face and non-face-to-face time during this encounter date, time was needed to gather information, review chart, records, communicate/coordinate with staff remotely, as well as complete documentation.   ___________________________________________ Ihor Austin. Benjamin Stain, M.D., ABFM., CAQSM. Primary Care and Sports Medicine [facility]  Adjunct Professor of Family Medicine  [employer]

## 2020-05-04 NOTE — Progress Notes (Signed)
Knee surgery in October. Friday jumped, pain in the heel shooting up shin to knee. Twisting foot causes pain as well. Just the right foot.

## 2020-05-05 ENCOUNTER — Other Ambulatory Visit: Payer: Self-pay

## 2020-05-05 ENCOUNTER — Ambulatory Visit (INDEPENDENT_AMBULATORY_CARE_PROVIDER_SITE_OTHER): Payer: Medicare Other | Admitting: Sports Medicine

## 2020-05-05 VITALS — Wt 141.0 lb

## 2020-05-05 DIAGNOSIS — M79671 Pain in right foot: Secondary | ICD-10-CM | POA: Diagnosis not present

## 2020-05-05 NOTE — Progress Notes (Signed)
Patient received cam boot for right foot injury per Dr. Karie Schwalbe

## 2020-05-08 ENCOUNTER — Other Ambulatory Visit: Payer: Self-pay | Admitting: Physician Assistant

## 2020-05-08 DIAGNOSIS — T887XXA Unspecified adverse effect of drug or medicament, initial encounter: Secondary | ICD-10-CM

## 2020-05-08 DIAGNOSIS — F330 Major depressive disorder, recurrent, mild: Secondary | ICD-10-CM

## 2020-05-08 DIAGNOSIS — G43009 Migraine without aura, not intractable, without status migrainosus: Secondary | ICD-10-CM

## 2020-05-12 ENCOUNTER — Other Ambulatory Visit: Payer: Self-pay | Admitting: Physician Assistant

## 2020-05-12 DIAGNOSIS — R4586 Emotional lability: Secondary | ICD-10-CM

## 2020-05-12 DIAGNOSIS — F330 Major depressive disorder, recurrent, mild: Secondary | ICD-10-CM

## 2020-05-13 ENCOUNTER — Other Ambulatory Visit: Payer: Self-pay | Admitting: Physician Assistant

## 2020-05-19 ENCOUNTER — Ambulatory Visit: Payer: Medicare Other | Admitting: Sports Medicine

## 2020-05-21 DIAGNOSIS — M25561 Pain in right knee: Secondary | ICD-10-CM | POA: Diagnosis not present

## 2020-05-21 DIAGNOSIS — M7522 Bicipital tendinitis, left shoulder: Secondary | ICD-10-CM | POA: Diagnosis not present

## 2020-05-21 DIAGNOSIS — M25511 Pain in right shoulder: Secondary | ICD-10-CM | POA: Diagnosis not present

## 2020-05-22 ENCOUNTER — Other Ambulatory Visit: Payer: Self-pay

## 2020-05-22 ENCOUNTER — Ambulatory Visit (INDEPENDENT_AMBULATORY_CARE_PROVIDER_SITE_OTHER): Payer: Medicare Other | Admitting: Sports Medicine

## 2020-05-22 ENCOUNTER — Other Ambulatory Visit: Payer: Self-pay | Admitting: Physician Assistant

## 2020-05-22 ENCOUNTER — Ambulatory Visit (INDEPENDENT_AMBULATORY_CARE_PROVIDER_SITE_OTHER): Payer: Medicare Other

## 2020-05-22 DIAGNOSIS — M79671 Pain in right foot: Secondary | ICD-10-CM

## 2020-05-22 DIAGNOSIS — I10 Essential (primary) hypertension: Secondary | ICD-10-CM

## 2020-05-22 NOTE — Progress Notes (Signed)
    Procedures performed today:    Procedure: Real-time Ultrasound Guided injection of the right plantar fascia origin Device: Samsung HS60  Verbal informed consent obtained.  Time-out conducted.  Noted no overlying erythema, induration, or other signs of local infection.  Skin prepped in a sterile fashion.  Local anesthesia: Topical Ethyl chloride.  With sterile technique and under real time ultrasound guidance: 1 cc Kenalog 40, 1 cc lidocaine, 1 cc bupivacaine injected easily Completed without difficulty  Advised to call if fevers/chills, erythema, induration, drainage, or persistent bleeding.  Images permanently stored and available for review in PACS.  Impression: Technically successful ultrasound guided injection.  Independent interpretation of notes and tests performed by another provider:   None.  Brief History, Exam, Impression, and Recommendations:    Intractable right heel pain Persistent intractable right heel pain, we tried a boot which only provided minimal relief. We did a right plantar fascia injection today. She was having some paresthesias into her right great toe, if the plantar fascia injection resolves this we know it was coming from a compressed plantar nerve. If not we will further investigate her lumbar spine likely with an updated MRI and/or nerve conduction/EMG, followed by an L4 epidural.    ___________________________________________ Gwen Her. Dianah Field, M.D., ABFM., CAQSM. Primary Care and [place] Instructor of [place] [employer] [facility] [employer]

## 2020-05-22 NOTE — Assessment & Plan Note (Signed)
Persistent intractable right heel pain, we tried a boot which only provided minimal relief. We did a right plantar fascia injection today. She was having some paresthesias into her right great toe, if the plantar fascia injection resolves this we know it was coming from a compressed plantar nerve. If not we will further investigate her lumbar spine likely with an updated MRI and/or nerve conduction/EMG, followed by an L4 epidural.

## 2020-06-01 DIAGNOSIS — S46219A Strain of muscle, fascia and tendon of other parts of biceps, unspecified arm, initial encounter: Secondary | ICD-10-CM | POA: Diagnosis not present

## 2020-06-01 DIAGNOSIS — R300 Dysuria: Secondary | ICD-10-CM | POA: Diagnosis not present

## 2020-06-01 DIAGNOSIS — Z79899 Other long term (current) drug therapy: Secondary | ICD-10-CM | POA: Diagnosis not present

## 2020-06-01 DIAGNOSIS — Z9889 Other specified postprocedural states: Secondary | ICD-10-CM | POA: Diagnosis not present

## 2020-06-01 DIAGNOSIS — F17210 Nicotine dependence, cigarettes, uncomplicated: Secondary | ICD-10-CM | POA: Diagnosis not present

## 2020-06-02 DIAGNOSIS — G4700 Insomnia, unspecified: Secondary | ICD-10-CM | POA: Diagnosis not present

## 2020-06-15 DIAGNOSIS — M7522 Bicipital tendinitis, left shoulder: Secondary | ICD-10-CM | POA: Diagnosis not present

## 2020-06-15 DIAGNOSIS — M24112 Other articular cartilage disorders, left shoulder: Secondary | ICD-10-CM | POA: Diagnosis not present

## 2020-06-15 DIAGNOSIS — G8918 Other acute postprocedural pain: Secondary | ICD-10-CM | POA: Diagnosis not present

## 2020-06-15 DIAGNOSIS — X58XXXA Exposure to other specified factors, initial encounter: Secondary | ICD-10-CM | POA: Diagnosis not present

## 2020-06-15 DIAGNOSIS — Y999 Unspecified external cause status: Secondary | ICD-10-CM | POA: Diagnosis not present

## 2020-06-15 DIAGNOSIS — S43432A Superior glenoid labrum lesion of left shoulder, initial encounter: Secondary | ICD-10-CM | POA: Diagnosis not present

## 2020-06-15 DIAGNOSIS — M7501 Adhesive capsulitis of right shoulder: Secondary | ICD-10-CM | POA: Diagnosis not present

## 2020-06-19 ENCOUNTER — Ambulatory Visit: Payer: Medicare Other | Admitting: Sports Medicine

## 2020-06-21 ENCOUNTER — Other Ambulatory Visit: Payer: Self-pay | Admitting: Physician Assistant

## 2020-06-21 DIAGNOSIS — Z8619 Personal history of other infectious and parasitic diseases: Secondary | ICD-10-CM

## 2020-06-21 DIAGNOSIS — A6000 Herpesviral infection of urogenital system, unspecified: Secondary | ICD-10-CM

## 2020-06-23 ENCOUNTER — Ambulatory Visit (INDEPENDENT_AMBULATORY_CARE_PROVIDER_SITE_OTHER): Payer: Medicare Other | Admitting: Sports Medicine

## 2020-06-23 DIAGNOSIS — M79671 Pain in right foot: Secondary | ICD-10-CM

## 2020-06-23 DIAGNOSIS — R200 Anesthesia of skin: Secondary | ICD-10-CM

## 2020-06-23 NOTE — Assessment & Plan Note (Signed)
[name] also reports chronic numbness of her right great toe, medial aspect of the toe. She does have some paresthesias into the thigh and lower leg as well. She has had some major knee surgery, what sounds to be a stabilization of a tibial plateau insufficiency fracture. Reports numbness since then. Clinically this sounds like a radiculopathy. I did review her lumbar spine MRI from 2019, she really does not have significant compression of the right L4 nerve. Differential is broad including right L4 radiculopathy, plantar nerve entrapment, injury of the deep component of the fibular nerve after her orthopedic surgery. We will proceed with nerve conduction and EMG to get a better idea of the site of nerve compression.

## 2020-06-23 NOTE — Assessment & Plan Note (Signed)
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

## 2020-06-23 NOTE — Progress Notes (Signed)
    Procedures performed today:    None.  Independent interpretation of notes and tests performed by another provider:   Personal review of lumbar spine MRI from 2019 shows mild DDD, no overt right L4 nerve root compression.  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  Numbness of right great toe [name] also reports chronic numbness of her right great toe, medial aspect of the toe. She does have some paresthesias into the thigh and lower leg as well. She has had some major knee surgery, what sounds to be a stabilization of a tibial plateau insufficiency fracture. Reports numbness since then. Clinically this sounds like a radiculopathy. I did review her lumbar spine MRI from 2019, she really does not have significant compression of the right L4 nerve. Differential is broad including right L4 radiculopathy, plantar nerve entrapment, injury of the deep component of the fibular nerve after her orthopedic surgery. We will proceed with nerve conduction and EMG to get a better idea of the site of nerve compression.    ___________________________________________ [name], M.D., ABFM., CAQSM. Primary Care and [place] [employer] [facility]

## 2020-07-23 ENCOUNTER — Telehealth: Payer: Self-pay | Admitting: Physician Assistant

## 2020-07-23 DIAGNOSIS — R200 Anesthesia of skin: Secondary | ICD-10-CM

## 2020-07-23 NOTE — Telephone Encounter (Signed)
[name] called and left a voicemail asking about her referral for Dr. Nelva Bush for her EMG. I do not have a referral for this please advise so I can call patient and let her know what is going on with referral. These can no longer just be orders they have to be sent to neuro for a consult first. - CF

## 2020-07-24 NOTE — Telephone Encounter (Signed)
I faxed order to [facility] and called Colette to let her know that I faxed it and they should call her to schedule - CF

## 2020-07-24 NOTE — Telephone Encounter (Signed)
No problem, switched orders to Dr. Nelva Bush

## 2020-07-31 DIAGNOSIS — Z4789 Encounter for other orthopedic aftercare: Secondary | ICD-10-CM | POA: Diagnosis not present

## 2020-07-31 DIAGNOSIS — M25512 Pain in left shoulder: Secondary | ICD-10-CM | POA: Diagnosis not present

## 2020-07-31 DIAGNOSIS — M6281 Muscle weakness (generalized): Secondary | ICD-10-CM | POA: Diagnosis not present

## 2020-08-01 ENCOUNTER — Other Ambulatory Visit: Payer: Self-pay | Admitting: Physician Assistant

## 2020-08-01 DIAGNOSIS — F330 Major depressive disorder, recurrent, mild: Secondary | ICD-10-CM

## 2020-08-05 DIAGNOSIS — M25512 Pain in left shoulder: Secondary | ICD-10-CM | POA: Diagnosis not present

## 2020-08-05 DIAGNOSIS — M6281 Muscle weakness (generalized): Secondary | ICD-10-CM | POA: Diagnosis not present

## 2020-08-05 DIAGNOSIS — Z4789 Encounter for other orthopedic aftercare: Secondary | ICD-10-CM | POA: Diagnosis not present

## 2020-08-10 DIAGNOSIS — M6281 Muscle weakness (generalized): Secondary | ICD-10-CM | POA: Diagnosis not present

## 2020-08-10 DIAGNOSIS — Z4789 Encounter for other orthopedic aftercare: Secondary | ICD-10-CM | POA: Diagnosis not present

## 2020-08-10 DIAGNOSIS — M25512 Pain in left shoulder: Secondary | ICD-10-CM | POA: Diagnosis not present

## 2020-08-13 DIAGNOSIS — M5412 Radiculopathy, cervical region: Secondary | ICD-10-CM | POA: Diagnosis not present

## 2020-08-18 ENCOUNTER — Telehealth (INDEPENDENT_AMBULATORY_CARE_PROVIDER_SITE_OTHER): Payer: Medicare Other | Admitting: Medical-Surgical

## 2020-08-18 ENCOUNTER — Encounter: Payer: Self-pay | Admitting: Medical-Surgical

## 2020-08-18 ENCOUNTER — Other Ambulatory Visit: Payer: Self-pay | Admitting: Physician Assistant

## 2020-08-18 VITALS — Temp 102.0°F

## 2020-08-18 DIAGNOSIS — B349 Viral infection, unspecified: Secondary | ICD-10-CM

## 2020-08-18 DIAGNOSIS — R059 Cough, unspecified: Secondary | ICD-10-CM | POA: Diagnosis not present

## 2020-08-18 DIAGNOSIS — F411 Generalized anxiety disorder: Secondary | ICD-10-CM

## 2020-08-18 MED ORDER — BENZONATATE 200 MG PO CAPS: 200.0000 mg | ORAL_CAPSULE | Freq: Three times a day (TID) | ORAL | 0 refills | Status: DC | PRN

## 2020-08-18 MED ORDER — HYDROCOD POLST-CPM POLST ER 10-8 MG/5ML PO SUER: 5.0000 mL | Freq: Two times a day (BID) | ORAL | 0 refills | Status: DC | PRN

## 2020-08-18 MED ORDER — ONDANSETRON 8 MG PO TBDP: 8.0000 mg | ORAL_TABLET | Freq: Three times a day (TID) | ORAL | 3 refills | Status: DC | PRN

## 2020-08-18 NOTE — Progress Notes (Signed)
Virtual Visit via Video Note  I connected with [name] on 08/18/20 at  9:50 AM EDT by a video enabled telemedicine application and verified that I am speaking with the correct person using two identifiers.   I discussed the limitations of evaluation and management by telemedicine and the availability of in person appointments. The patient expressed understanding and agreed to proceed.  Patient location: home Provider locations: office  Subjective:    CC: Viral symptoms  HPI: Pleasant 45 year old female presenting with complaints of 4 days of viral symptoms including fever T-max 104, nonproductive cough, chills, body aches, and rib pain with coughing severe headache, nausea, vomiting, and occasional dry heaves.  Her symptoms started on Saturday suddenly.  She notes that she felt the same symptoms when she had COVID 2 times before.  She has not taken a test for COVID but does have 1 at home.  Has not been able to eat and drink well because of the nausea.  She has been taking DayQuil, Mucinex, and Phenergan with minimal to no relief of symptoms.  Denies shortness of breath, chest pain and mental status changes.  She does have a pulse ox at home and is able to monitor her oxygen levels.  Past medical history, Surgical history, Family history not pertinant except as noted below, Social history, Allergies, and medications have been entered into the medical record, reviewed, and corrections made.   Review of Systems: See HPI for pertinent positives and negatives.   Objective:    General: Speaking clearly in complete sentences without any shortness of breath.  Alert and oriented x3.  Normal judgment. No apparent acute distress.  Impression and Recommendations:    1. Viral illness with cough Strongly recommend COVID testing and advised her to let us know what the results are.  Symptoms consistent with a viral illness so symptomatic treatment indicated.  For now consider  conservative measures using over-the-counter treatments.  Sending in Tussionex as well as Best boy.  Also sending in Zofran to alternate with Phenergan.  Drink clear fluids and small sips frequently.  When ready, start with bland foods and small frequent meals.  Reviewed symptoms to seek emergency care for. - chlorpheniramine-HYDROcodone ([place]) 10-8 MG/5ML SUER; Take 5 mLs by mouth every 12 (twelve) hours as needed.  Dispense: 70 mL; Refill: 0   I discussed the assessment and treatment plan with the patient. The patient was provided an opportunity to ask questions and all were answered. The patient agreed with the plan and demonstrated an understanding of the instructions.   The patient was advised to call back or seek an in-person evaluation if the symptoms worsen or if the condition fails to improve as anticipated.  20 minutes of non-face-to-face time was provided during this encounter.  Return if symptoms worsen or fail to improve.  Michele Sorrel, DNP, APRN, FNP-BC [facility]

## 2020-08-20 ENCOUNTER — Other Ambulatory Visit: Payer: Self-pay | Admitting: Physician Assistant

## 2020-08-20 MED ORDER — ALBUTEROL SULFATE HFA 108 (90 BASE) MCG/ACT IN AERS: 2.0000 | INHALATION_SPRAY | Freq: Four times a day (QID) | RESPIRATORY_TRACT | 0 refills | Status: DC | PRN

## 2020-08-24 ENCOUNTER — Telehealth (INDEPENDENT_AMBULATORY_CARE_PROVIDER_SITE_OTHER): Payer: Medicare Other | Admitting: Family Medicine

## 2020-08-24 ENCOUNTER — Encounter: Payer: Self-pay | Admitting: Family Medicine

## 2020-08-24 VITALS — Temp 99.9°F | Wt 136.0 lb

## 2020-08-24 DIAGNOSIS — J329 Chronic sinusitis, unspecified: Secondary | ICD-10-CM | POA: Diagnosis not present

## 2020-08-24 DIAGNOSIS — J4521 Mild intermittent asthma with (acute) exacerbation: Secondary | ICD-10-CM

## 2020-08-24 DIAGNOSIS — J40 Bronchitis, not specified as acute or chronic: Secondary | ICD-10-CM

## 2020-08-24 DIAGNOSIS — B370 Candidal stomatitis: Secondary | ICD-10-CM | POA: Diagnosis not present

## 2020-08-24 MED ORDER — AMOXICILLIN-POT CLAVULANATE 875-125 MG PO TABS
1.0000 | ORAL_TABLET | Freq: Two times a day (BID) | ORAL | 0 refills | Status: AC
Start: 2020-08-24 — End: 2020-08-31

## 2020-08-24 MED ORDER — ALBUTEROL SULFATE HFA 108 (90 BASE) MCG/ACT IN AERS: 2.0000 | INHALATION_SPRAY | Freq: Four times a day (QID) | RESPIRATORY_TRACT | 0 refills | Status: DC | PRN

## 2020-08-24 MED ORDER — FLUCONAZOLE 150 MG PO TABS: 150.0000 mg | ORAL_TABLET | Freq: Once | ORAL | 0 refills | Status: AC

## 2020-08-24 MED ORDER — PROMETHAZINE-DM 6.25-15 MG/5ML PO SYRP: 5.0000 mL | ORAL_SOLUTION | Freq: Four times a day (QID) | ORAL | 0 refills | Status: DC | PRN

## 2020-08-24 MED ORDER — NYSTATIN 100000 UNIT/ML MT SUSP: 500000.0000 [IU] | Freq: Four times a day (QID) | OROMUCOSAL | 0 refills | Status: DC

## 2020-08-24 MED ORDER — GUAIFENESIN ER 600 MG PO TB12: 1200.0000 mg | ORAL_TABLET | Freq: Two times a day (BID) | ORAL | 2 refills | Status: DC

## 2020-08-24 NOTE — Progress Notes (Signed)
Virtual Video Visit via MyChart Note  I connected with  [name] on 08/24/20 at  2:20 PM EDT by the video enabled telemedicine application for MyChart, and verified that I am speaking with the correct person using two identifiers.   I introduced myself as a Designer, jewellery with the practice. We discussed the limitations of evaluation and management by telemedicine and the availability of in person appointments. The patient expressed understanding and agreed to proceed.  Participating parties in this visit include: The patient and the nurse practitioner listed.  The patient is: At home I am: In the office - [facility]  Subjective:    CC:  Chief Complaint  Patient presents with  . Sore Throat  . Nasal Congestion    HPI: [name] is a 45 y.o. year old female presenting today via [place] today for nasal congestoin . Patient states she has been feeling bad for at least 2 weeks now. She saw Samuel Bouche, NP via a virtual visit on 4/19 for a viral infection and was given some Tussionex and encouraged symptomatic treatment and COVID testing. She has had 2 negative tests since symptoms began. She is reporting chest congestion with productive cough, dyspnea on exertion and mild wheezing with chest tightness - requiring albuterol inhaler 2-3x/day, nasal congestion, sore throat, mouth burning with white patches, headaches, pansinusitis pressure 8/10, nausea, poor appetite, fatigue and fevers daily for the past week (100 F). She is having trouble sleeping because of the symptoms and has been doing well with the  Tussionex.  She denies any chest pain, vomiting, diarrhea, abdominal pain, dysuria, blood in urine/stool, neck pain/stiffness. So far she has only been taking the Tussionex for her symptoms.     Past medical history, Surgical history, Family history not pertinant except as noted below, Social history, Allergies, and medications have been entered  into the medical record, reviewed, and corrections made.   Review of Systems:  All review of systems negative except what is listed in the HPI   Objective:    General:  Speaking clearly in complete sentences. Absent shortness of breath noted.   Alert and oriented x3.   Normal judgment.  Absent acute distress.   Impression and Recommendations:    1. Sinobronchitis 2. Mild intermittent asthma with exacerbation Patient with duration and severity consistent with bacterial infection. Treating with Augmentin. Recommend she take  Mucinex, OTC analgesics for pain and fever control, rest, hydration, humidifier use, warm compresses, etc. History of yeast infections following antibiotic use - will send in Diflucan. Also refilling albuterol inhaler. Promethazine DM given for cough considering she just had some Tussionex and PDMP with multiple narcotics would prefer to avoid if able. Patient educated on signs and symptoms that would require further evaluation. If not improving within the next several days or if symptoms worsen, she needs to be seen in person for extensive exam and possible chest x-ray if indicated. Could also consider oral steroids if symptoms not improving.  - amoxicillin-clavulanate (AUGMENTIN) 875-125 MG tablet; Take 1 tablet by mouth 2 (two) times daily for 7 days.  Dispense: 14 tablet; Refill: 0 - fluconazole (DIFLUCAN) 150 MG tablet; Take 1 tablet (150 mg total) by mouth once for 1 dose. Can repeat after 1-2 days if not improving.  Dispense: 2 tablet; Refill: 0 - promethazine-dextromethorphan (PROMETHAZINE-DM) 6.25-15 MG/5ML syrup; Take 5 mLs by mouth 4 (four) times daily as needed for cough.  Dispense: 118 mL; Refill: 0 - albuterol (VENTOLIN HFA) 108 (90  Base) MCG/ACT inhaler; Inhale 2 puffs into the lungs every 6 (six) hours as needed.  Dispense: 18 each; Refill: 0 - guaiFENesin (MUCINEX) 600 MG 12 hr tablet; Take 2 tablets (1,200 mg total) by mouth 2 (two) times daily.   Dispense: 30 tablet; Refill: 2  3. Oral thrush Unable to visualize on video visit, but description consistent with oral thrush. Will give her Nystatin rinse and encouraged she rinse her mouth after using inhalers.   - nystatin (MYCOSTATIN) 100000 UNIT/ML suspension; Take 5 mLs (500,000 Units total) by mouth 4 (four) times daily. Swish for 30 seconds and spit out.  Dispense: 180 mL; Refill: 0    Follow-up if symptoms worsen or fail to improve.    I discussed the assessment and treatment plan with the patient. The patient was provided an opportunity to ask questions and all were answered. The patient agreed with the plan and demonstrated an understanding of the instructions.   The patient was advised to call back or seek an in-person evaluation if the symptoms worsen or if the condition fails to improve as anticipated.  I provided 20 minutes of non-face-to-face interaction with this [facility] visit including intake, same-day documentation, and chart review.   Terrilyn Saver, NP

## 2020-09-01 ENCOUNTER — Other Ambulatory Visit: Payer: Self-pay | Admitting: Neurology

## 2020-09-01 DIAGNOSIS — F5101 Primary insomnia: Secondary | ICD-10-CM

## 2020-09-01 MED ORDER — ZOLPIDEM TARTRATE 10 MG PO TABS: 10.0000 mg | ORAL_TABLET | Freq: Every day | ORAL | 0 refills | Status: DC

## 2020-09-01 NOTE — Telephone Encounter (Signed)
Patient preferred virtual, is virtual okay for this?

## 2020-09-01 NOTE — Telephone Encounter (Signed)
Yes virtual okay.

## 2020-09-01 NOTE — Telephone Encounter (Signed)
Needs office visit. Will give one refill.

## 2020-09-01 NOTE — Telephone Encounter (Signed)
Last written 02/26/2020 #30 with 5 refills Last normal follow up 02/25/2021, has been seen for urgent visits since

## 2020-09-01 NOTE — Telephone Encounter (Signed)
Please schedule follow up

## 2020-09-07 ENCOUNTER — Telehealth (INDEPENDENT_AMBULATORY_CARE_PROVIDER_SITE_OTHER): Payer: Medicare Other | Admitting: Physician Assistant

## 2020-09-07 ENCOUNTER — Encounter: Payer: Self-pay | Admitting: Physician Assistant

## 2020-09-07 VITALS — BP 126/72 | HR 78

## 2020-09-07 DIAGNOSIS — I10 Essential (primary) hypertension: Secondary | ICD-10-CM

## 2020-09-07 DIAGNOSIS — F5101 Primary insomnia: Secondary | ICD-10-CM

## 2020-09-07 DIAGNOSIS — Z13220 Encounter for screening for lipoid disorders: Secondary | ICD-10-CM | POA: Diagnosis not present

## 2020-09-07 DIAGNOSIS — Z131 Encounter for screening for diabetes mellitus: Secondary | ICD-10-CM

## 2020-09-07 DIAGNOSIS — G2581 Restless legs syndrome: Secondary | ICD-10-CM

## 2020-09-07 DIAGNOSIS — J4520 Mild intermittent asthma, uncomplicated: Secondary | ICD-10-CM | POA: Diagnosis not present

## 2020-09-07 MED ORDER — METOPROLOL SUCCINATE ER 100 MG PO TB24: 100.0000 mg | ORAL_TABLET | Freq: Every day | ORAL | 1 refills | Status: AC

## 2020-09-07 MED ORDER — ZOLPIDEM TARTRATE 10 MG PO TABS: 10.0000 mg | ORAL_TABLET | Freq: Every day | ORAL | 5 refills | Status: DC

## 2020-09-07 MED ORDER — HYDROCHLOROTHIAZIDE 12.5 MG PO TABS: 12.5000 mg | ORAL_TABLET | Freq: Every day | ORAL | 1 refills | Status: DC | PRN

## 2020-09-07 MED ORDER — ALBUTEROL SULFATE (2.5 MG/3ML) 0.083% IN NEBU
2.5000 mg | INHALATION_SOLUTION | RESPIRATORY_TRACT | 2 refills | Status: AC | PRN
Start: 2020-09-07 — End: ?

## 2020-09-07 MED ORDER — ROPINIROLE HCL 0.25 MG PO TABS: 0.2500 mg | ORAL_TABLET | Freq: Every day | ORAL | 1 refills | Status: DC

## 2020-09-07 NOTE — Progress Notes (Signed)
Patient ID: [name], female   DOB: [birth year], 45 y.o.   MRN: [id number] .Marland KitchenVirtual Visit via Video Note  I connected with [name] on 09/07/20 at  1:40 PM EDT by a video enabled telemedicine application and verified that I am speaking with the correct person using two identifiers.  Location: Patient: home Provider: clinic  .Marland KitchenParticipating in visit:  Patient: [name]  Provider: Iran Planas PA-C   I discussed the limitations of evaluation and management by telemedicine and the availability of in person appointments. The patient expressed understanding and agreed to proceed.  History of Present Illness: Pt is a 45 yo female with chronic pain, insomnia, restless leg, hypertension, asthma who calls into the clinic for refills.  Overall patient is doing well.  She does need new refills.  Her albuterol inhaler was really expensive at the pharmacy she is requesting some nebulizer solutions that she can use with the machine at her home.  Her sleep is well controlled on Ambien 10 mg at bedtime.  Her restless legs are controlled with Requip.  Patient denies any palpitations and on metoprolol.  Patient's pain is controlled at pain clinic.  .. Active Ambulatory Problems    Diagnosis Date Noted  . Anxiety state 07/06/2007  . Smoker 02/05/2010  . Depressed 06/26/2008  . INTERSTITIAL CYSTITIS 02/05/2010  . Insomnia 07/06/2007  . FATIGUE 07/02/2009  . HEADACHE 07/06/2007  . TROCHANTERIC BURSITIS 06/21/2010  . Tick-borne disease 08/09/2010  . PTSD (post-traumatic stress disorder) 12/14/2012  . Weight gain 11/29/2013  . Greater trochanteric bursitis 11/29/2013  . Constipation due to opioid therapy 01/17/2014  . Left shoulder pain 04/02/2014  . Carpal tunnel syndrome of left wrist 05/05/2014  . Myofascial pain syndrome 05/29/2014  . Paresthesia 06/03/2014  . Endometriosis 07/20/2014  . Tachycardia 08/13/2014  . S/P cervical spinal fusion 10/28/2014  .  Therapeutic opioid induced constipation 12/16/2014  . Vasomotor flushing 06/15/2015  . GAD (generalized anxiety disorder) 06/15/2015  . Fixed pupils 08/31/2015  . Dysuria 08/31/2015  . Depression 08/31/2015  . Primary osteoarthritis of left first metacarpophalangeal joint 04/04/2016  . Palpitations 04/14/2016  . Balance problems 07/19/2016  . Dizziness 07/19/2016  . Primary osteoarthritis of right knee 09/05/2016  . Trigger thumb, left thumb 10/06/2016  . Chronic pain syndrome 01/08/2017  . 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  . First degree ankle sprain, left, initial encounter 08/29/2017  . Hypertension 10/06/2017  . Hx of cold sores 10/06/2017  . Asthma 01/29/2018  . Chronic neck pain 01/29/2018  . Traumatic ecchymosis of left foot 10/15/2018  . Mild episode of recurrent major depressive disorder ([place]) 10/30/2018  . Dog bite 02/15/2019  . Leukocytes in urine 03/04/2019  . Other microscopic hematuria 03/04/2019  . Left ovarian cyst 03/04/2019  . RLS (restless legs syndrome) 07/08/2019  . Mood changes 08/25/2019  . Pes anserine bursitis 09/03/2019  . Nodule right fifth PIP of hand 09/03/2019  . Impingement syndrome, shoulder, left 12/30/2019  . COVID-19 virus infection 01/14/2020  . Intractable right heel pain 05/04/2020  . Numbness of right great toe 06/23/2020   Resolved Ambulatory Problems    Diagnosis Date Noted  . RHINITIS 10/13/2009  . Polyuria 11/04/2010  . Degenerative disc disease, cervical 10/31/2013  . Cervical disc disorder with radiculopathy of cervical region 11/29/2013  . Itching 11/29/2013  . Radiculitis of left cervical region 01/17/2014  . Nausea with vomiting 11/25/2014  . Dysmenorrhea 05/11/2015  . Cervical disc disorder with radiculopathy of cervical region 11/11/2015  . Acute upper  respiratory infection 04/04/2016  . Localized primary carpometacarpal osteoarthritis, left 04/04/2016  . Cervical post-laminectomy syndrome 07/08/2019   Past Medical History:   Diagnosis Date  . Acute medial meniscus tear of right knee   . Anxiety   . Arthritis   . Complication of anesthesia   . COVID-19 01/15/2020  . Dysrhythmia   . Elevated heart rate and blood pressure   . Family history of adverse reaction to anesthesia   . Fibromyalgia   . GERD (gastroesophageal reflux disease)   . IC (interstitial cystitis)   . Myofascial pain syndrome, cervical   . Neck pain   . Ovarian cyst   . PONV (postoperative nausea and vomiting)    Reviewed med, allergy, problem list.     Observations/Objective: No acute distress Normal mood and appearance  .[name] Today's Vitals   09/07/20 1345  BP: 126/72  Pulse: 78   There is no height or weight on file to calculate BMI.    Assessment and Plan: Marland KitchenMarland KitchenDiagnoses and all orders for this visit:  Screening for lipid disorders -     Lipid Panel w/reflex Direct LDL  Primary insomnia -     zolpidem (AMBIEN) 10 MG tablet; Take 1 tablet (10 mg total) by mouth at bedtime. -     COMPLETE METABOLIC PANEL WITH GFR  RLS (restless legs syndrome) -     rOPINIRole (REQUIP) 0.25 MG tablet; Take 1-3 tablets (0.25-0.75 mg total) by mouth at bedtime. -     COMPLETE METABOLIC PANEL WITH GFR  Essential hypertension -     metoprolol succinate (TOPROL-XL) 100 MG 24 hr tablet; Take 1 tablet (100 mg total) by mouth daily. -     hydrochlorothiazide (HYDRODIURIL) 12.5 MG tablet; Take 1 tablet (12.5 mg total) by mouth daily as needed. -     COMPLETE METABOLIC PANEL WITH GFR  Screening for diabetes mellitus -     COMPLETE METABOLIC PANEL WITH GFR  Primary hypertension  Mild intermittent asthma, unspecified whether complicated -     albuterol (PROVENTIL) (2.5 MG/3ML) 0.083% nebulizer solution; Take 3 mLs (2.5 mg total) by nebulization every 4 (four) hours as needed for wheezing or shortness of breath (please include nebulizer machine, hoses, and mask if needed.).   Overall patient is doing very well.  She seems to have recovered from  her sinobronchitis.  She does request nebulizer solution to intermittently use with albuterol inhaler.  The albuterol inhaler was much more expensive at the pharmacy this visit.  Refilled medications for hypertension, insomnia, and restless leg.  Patient does need fasting labs.  Those were ordered today.    Follow Up Instructions:    I discussed the assessment and treatment plan with the patient. The patient was provided an opportunity to ask questions and all were answered. The patient agreed with the plan and demonstrated an understanding of the instructions.   The patient was advised to call back or seek an in-person evaluation if the symptoms worsen or if the condition fails to improve as anticipated.   Iran Planas, PA-C

## 2020-09-15 DIAGNOSIS — M67912 Unspecified disorder of synovium and tendon, left shoulder: Secondary | ICD-10-CM | POA: Diagnosis not present

## 2020-09-15 DIAGNOSIS — M961 Postlaminectomy syndrome, not elsewhere classified: Secondary | ICD-10-CM | POA: Diagnosis not present

## 2020-09-15 DIAGNOSIS — Z79899 Other long term (current) drug therapy: Secondary | ICD-10-CM | POA: Diagnosis not present

## 2020-09-15 DIAGNOSIS — J45909 Unspecified asthma, uncomplicated: Secondary | ICD-10-CM | POA: Diagnosis not present

## 2020-09-15 DIAGNOSIS — T7840XS Allergy, unspecified, sequela: Secondary | ICD-10-CM | POA: Diagnosis not present

## 2020-09-18 DIAGNOSIS — M5416 Radiculopathy, lumbar region: Secondary | ICD-10-CM | POA: Diagnosis not present

## 2020-09-18 DIAGNOSIS — M5459 Other low back pain: Secondary | ICD-10-CM | POA: Diagnosis not present

## 2020-09-29 DIAGNOSIS — Z1339 Encounter for screening examination for other mental health and behavioral disorders: Secondary | ICD-10-CM | POA: Diagnosis not present

## 2020-09-29 DIAGNOSIS — E559 Vitamin D deficiency, unspecified: Secondary | ICD-10-CM | POA: Diagnosis not present

## 2020-09-29 DIAGNOSIS — Z9889 Other specified postprocedural states: Secondary | ICD-10-CM | POA: Diagnosis not present

## 2020-09-29 DIAGNOSIS — F17210 Nicotine dependence, cigarettes, uncomplicated: Secondary | ICD-10-CM | POA: Diagnosis not present

## 2020-09-29 DIAGNOSIS — Z131 Encounter for screening for diabetes mellitus: Secondary | ICD-10-CM | POA: Diagnosis not present

## 2020-09-29 DIAGNOSIS — Z79899 Other long term (current) drug therapy: Secondary | ICD-10-CM | POA: Diagnosis not present

## 2020-09-29 DIAGNOSIS — Z20822 Contact with and (suspected) exposure to covid-19: Secondary | ICD-10-CM | POA: Diagnosis not present

## 2020-09-29 DIAGNOSIS — M5416 Radiculopathy, lumbar region: Secondary | ICD-10-CM | POA: Diagnosis not present

## 2020-09-29 DIAGNOSIS — R0602 Shortness of breath: Secondary | ICD-10-CM | POA: Diagnosis not present

## 2020-09-29 DIAGNOSIS — S46219A Strain of muscle, fascia and tendon of other parts of biceps, unspecified arm, initial encounter: Secondary | ICD-10-CM | POA: Diagnosis not present

## 2020-09-29 DIAGNOSIS — E7800 Pure hypercholesterolemia, unspecified: Secondary | ICD-10-CM | POA: Diagnosis not present

## 2020-09-29 LAB — CBC: RBC: 4.97 (ref 3.87–5.11)

## 2020-09-29 LAB — CBC AND DIFFERENTIAL
HCT: 46 (ref 36–46)
Hemoglobin: 15.4 (ref 12.0–16.0)
Platelets: 350 (ref 150–399)
WBC: 10.2

## 2020-10-01 ENCOUNTER — Encounter: Payer: Self-pay | Admitting: Physician Assistant

## 2020-10-02 NOTE — Telephone Encounter (Signed)
No I have not seen them. Can we call and get them to fax over.

## 2020-10-07 ENCOUNTER — Ambulatory Visit (INDEPENDENT_AMBULATORY_CARE_PROVIDER_SITE_OTHER): Payer: Medicare Other | Admitting: Physician Assistant

## 2020-10-07 DIAGNOSIS — Z1231 Encounter for screening mammogram for malignant neoplasm of breast: Secondary | ICD-10-CM

## 2020-10-07 DIAGNOSIS — Z1211 Encounter for screening for malignant neoplasm of colon: Secondary | ICD-10-CM | POA: Diagnosis not present

## 2020-10-07 DIAGNOSIS — Z1212 Encounter for screening for malignant neoplasm of rectum: Secondary | ICD-10-CM

## 2020-10-07 DIAGNOSIS — Z0000 Encounter for general adult medical examination without abnormal findings: Secondary | ICD-10-CM | POA: Diagnosis not present

## 2020-10-07 NOTE — Progress Notes (Signed)
MEDICARE ANNUAL WELLNESS VISIT  10/07/2020  Telephone Visit Disclaimer This Medicare AWV was conducted by telephone due to national recommendations for restrictions regarding the COVID-19 Pandemic (e.g. social distancing).  I verified, using two identifiers, that I am speaking with [name] or their authorized healthcare agent. I discussed the limitations, risks, security, and privacy concerns of performing an evaluation and management service by telephone and the potential availability of an in-person appointment in the future. The patient expressed understanding and agreed to proceed.  Location of Patient: Home Location of Provider (nurse):  Provider home  Subjective:    [name] is a 45 y.o. female patient of Donella Stade, PA-C who had a [employer] Visit today via telephone. [name] is Retired and lives with their son and daughter. she has 2 children. she reports that she is socially active and does interact with friends/family regularly. she is minimally physically active and enjoys yardwork and going to the beach.  Patient Care Team: Lavada Mesi as PCP - General (Family Medicine) Leeroy Cha, MD as Consulting Physician (Neurosurgery) Silverio Decamp, MD as Consulting Physician (Family Medicine)  Advanced Directives 10/07/2020 02/04/2020 07/16/2019 04/09/2019  Does Patient Have a Medical Advance Directive? No No No No  Would patient like information on creating a medical advance directive? No - Patient declined Yes (MAU/Ambulatory/Procedural Areas - Information given) No - Patient declined Yes (MAU/Ambulatory/Procedural Areas - Information given)  Some encounter information is confidential and restricted. Go to Review Flowsheets activity to see all data.    Hospital Utilization Over the Past 12 Months: # of hospitalizations or ER visits: 0 # of surgeries: 2  Review of Systems    Patient reports that her overall  health is worse compared to last year.  History obtained from chart review and the patient  Patient Reported Readings (BP, Pulse, CBG, Weight, etc) none  Pain Assessment Pain : 0-10 Pain Score: 4  Pain Type: Chronic pain Pain Location: Back Pain Orientation: Lower Pain Descriptors / Indicators: Constant Pain Onset: More than a month ago Pain Frequency: Constant Pain Relieving Factors: muscle relaxer and rest Effect of Pain on Daily Activities: unable to stand or walk for a long period of time.  Pain Relieving Factors: muscle relaxer and rest  Current Medications & Allergies (verified) Allergies as of 10/07/2020      Reactions   Gadolinium Derivatives Hives, Itching, Other (See Comments)   After MRI with [facility] finished, patient had redness and itching on chest, tightness in throat.  Patient went to ED to be monitored (04/23/14).had mri since takes benadryl before, tolerates well   Iodinated Diagnostic Agents Rash, Itching   Prednisone Other (See Comments)   Pain all over, "it triggers my myofascial pain syndrome"    Ioversol    Other reaction(s): Hives   Amitriptyline Other (See Comments)   Gained weight and does not want to be on.    Eggs Or Egg-derived Products Rash   Hydrocodone-acetaminophen Itching   Tolerates with benadryl (no reaction to oxycodone)   Lunesta [eszopiclone] Other (See Comments)   Metallic taste in mouth   Tramadol Itching   Other reaction(s): Unknown      Medication List       Accurate as of October 07, 2020  3:00 PM. If you have any questions, ask your nurse or doctor.        albuterol 108 (90 Base) MCG/ACT inhaler Commonly known as: Ventolin HFA Inhale 2 puffs into the  lungs every 6 (six) hours as needed.   albuterol (2.5 MG/3ML) 0.083% nebulizer solution Commonly known as: PROVENTIL Take 3 mLs (2.5 mg total) by nebulization every 4 (four) hours as needed for wheezing or shortness of breath (please include nebulizer machine, hoses, and mask  if needed.).   Breo Ellipta 100-25 MCG/INH Aepb Generic drug: fluticasone furoate-vilanterol INHALE 1 PUFF BY MOUTH EVERY DAY   buPROPion 100 MG 12 hr tablet Commonly known as: WELLBUTRIN SR Take 100 mg by mouth daily.   guaiFENesin 600 MG 12 hr tablet Commonly known as: Mucinex Take 2 tablets (1,200 mg total) by mouth 2 (two) times daily.   hydrochlorothiazide 12.5 MG tablet Commonly known as: HYDRODIURIL Take 1 tablet (12.5 mg total) by mouth daily as needed.   hydrOXYzine 50 MG tablet Commonly known as: ATARAX/VISTARIL Take 1 tablet (50 mg total) by mouth 3 (three) times daily as needed. Needs appt   methocarbamol 500 MG tablet Commonly known as: ROBAXIN Take 1 tablet by mouth 2 (two) times daily as needed.   metoprolol succinate 100 MG 24 hr tablet Commonly known as: TOPROL-XL Take 1 tablet (100 mg total) by mouth daily.   ondansetron 8 MG disintegrating tablet Commonly known as: ZOFRAN-ODT Take 1 tablet (8 mg total) by mouth every 8 (eight) hours as needed for nausea.   pantoprazole 40 MG tablet Commonly known as: PROTONIX TAKE 1 TABLET BY MOUTH EVERY DAY   pregabalin 200 MG capsule Commonly known as: LYRICA Take 100 mg by mouth 3 (three) times daily. 100 mg tid   promethazine 25 MG tablet Commonly known as: PHENERGAN TAKE 1 TABLET (25 MG TOTAL) BY MOUTH EVERY 8 (EIGHT) HOURS AS NEEDED FOR NAUSEA OR VOMITING.   rizatriptan 10 MG tablet Commonly known as: MAXALT TAKE 1 TABLET AS NEEDED FOR MIGRAINE, MAY REPEAT 2 HRS AS NEEDED   rOPINIRole 0.25 MG tablet Commonly known as: REQUIP Take 1-3 tablets (0.25-0.75 mg total) by mouth at bedtime.   triamcinolone 0.025 % cream Commonly known as: KENALOG APPLY TO AFFECTED AREA TWICE A DAY AS NEEDED   valACYclovir 1000 MG tablet Commonly known as: VALTREX TAKE 1 TABLET BY MOUTH EVERY DAY   zolpidem 10 MG tablet Commonly known as: AMBIEN Take 1 tablet (10 mg total) by mouth at bedtime.       History  (reviewed): Past Medical History:  Diagnosis Date  . Acute medial meniscus tear of right knee   . Anxiety   . Arthritis   . Asthma   . Complication of anesthesia    likes iv med and scopolamine patch also, limited neck motion  . COVID-19 01/15/2020   antibody infusion given 01-16-2020 no symptoms since infusion  . Dysrhythmia    tachycardia  . Elevated heart rate and blood pressure    takes toprol for increased heart rate  . Family history of adverse reaction to anesthesia    mother got blood clots after anesthesia , mother on heparin now  . Fibromyalgia   . GERD (gastroesophageal reflux disease)   . Headache(784.0)    migraines  . IC (interstitial cystitis)   . Insomnia   . Myofascial pain syndrome, cervical   . Neck pain    C 2 and C 3 slipped disc has neck pain with, limited neck motion  . Ovarian cyst    x 2now  . PONV (postoperative nausea and vomiting)    Past Surgical History:  Procedure Laterality Date  . CARPAL TUNNEL RELEASE Left 06/05/2014  Procedure: LEFT CARPAL TUNNEL RELEASE;  Surgeon: Daryll Brod, MD;  Location: [facility];  Service: Orthopedics;  Laterality: Left;  . CARPAL TUNNEL RELEASE Right 08/25/2014   Procedure: RIGHT CARPAL TUNNEL RELEASE;  Surgeon: Daryll Brod, MD;  Location: [place];  Service: Orthopedics;  Laterality: Right;  . CARPAL TUNNEL RELEASE Left 07/14/2015   Procedure: LEFT CARPAL TUNNEL RELEASE;  Surgeon: Daryll Brod, MD;  Location: [place];  Service: Orthopedics;  Laterality: Left;  . CERVICAL SPINE SURGERY  11/2013   dr Joya Salm  . CESAREAN SECTION     x2  . DILATION AND EVACUATION N/A 05/07/2013   Procedure: DILATATION AND EVACUATION with tissue sent for chromosome analysis;  Surgeon: Lovenia Kim, MD;  Location: [facility];  Service: Gynecology;  Laterality: N/A;  . EPIDURAL STEROID INJECTION  03/20/15  . KNEE ARTHROSCOPY WITH MEDIAL MENISECTOMY Right 04/09/2019   Procedure: Right knee  arthroscopy fat pad resection with partial medial meniscectomy;  Surgeon: Nicholes Stairs, MD;  Location: [facility];  Service: Orthopedics;  Laterality: Right;  60 mins  . KNEE ARTHROSCOPY WITH SUBCHONDROPLASTY Right 02/04/2020   Procedure: Right knee arthroscopy with partial medial meniscectomy with lateral tibia subchondroplasty;  Surgeon: Nicholes Stairs, MD;  Location: [facility];  Service: Orthopedics;  Laterality: Right;  75 mins  . OVARIAN CYST REMOVAL    . PELVIC LAPAROSCOPY  2003   endometriosis  . POSTERIOR CERVICAL FUSION/FORAMINOTOMY N/A 10/28/2014   Procedure: Cervical Four-Cervical Seven Posterior cervical fusion with lateral mass fixation;  Surgeon: Eustace Moore, MD;  Location: [facility];  Service: Neurosurgery;  Laterality: N/A;  posterior  . ROBOTIC ASSISTED LAPAROSCOPIC LYSIS OF ADHESION N/A 07/18/2014   Procedure: ROBOTIC ASSISTED LAPAROSCOPIC  EXCISION POSTERIOR  UTERINE WALL MASS; EXCISION RIGHT MESSALEINGEAL MASS, EXCISION AND ABLATION CULDASAC ENDOMETRIOSIS;  Surgeon: Brien Few, MD;  Location: [facility];  Service: Gynecology;  Laterality: N/A;  . ROBOTIC ASSISTED TOTAL HYSTERECTOMY WITH SALPINGECTOMY Bilateral 05/11/2015   Procedure: ROBOTIC ASSISTED TOTAL HYSTERECTOMY WITH BILATERAL SALPINGECTOMY;  Surgeon: Brien Few, MD;  Location: [facility];  Service: Gynecology;  Laterality: Bilateral;   Family History  Problem Relation Age of Onset  . Heart attack Father   . ADD / ADHD Brother   . Depression Brother   . Depression Cousin    Social History   Socioeconomic History  . Marital status: Divorced    Spouse name: Not on file  . Number of children: 2  . Years of education: 35  . Highest education level: Associate degree: occupational, Hotel manager, or vocational program  Occupational History  . Occupation: CMA    Employer: [employer]    Comment: [place]: Disabled  Tobacco Use  . Smoking status:  Current Every Day Smoker    Packs/day: 0.50    Years: 23.00    Pack years: 11.50    Types: Cigarettes  . Smokeless tobacco: Never Used  Vaping Use  . Vaping Use: Never used  Substance and Sexual Activity  . Alcohol use: No    Alcohol/week: 0.0 standard drinks  . Drug use: No  . Sexual activity: Yes    Birth control/protection: Surgical  Other Topics Concern  . Not on file  Social History Narrative   Lives with children [place] and [place].  Stays in a lot of pain all the time. She enjoys doing Haematologist and enjoys going to [employer].   Social Determinants of Health  Financial Resource Strain: Low Risk   . Difficulty of Paying Living Expenses: Not hard at all  Food Insecurity: No Food Insecurity  . Worried About Charity fundraiser in the Last Year: Never true  . Ran Out of Food in the Last Year: Never true  Transportation Needs: No Transportation Needs  . Lack of Transportation (Medical): No  . Lack of Transportation (Non-Medical): No  Physical Activity: Inactive  . Days of Exercise per Week: 0 days  . Minutes of Exercise per Session: 0 min  Stress: Stress Concern Present  . Feeling of Stress : Very much  Social Connections: Socially Isolated  . Frequency of Communication with Friends and Family: More than three times a week  . Frequency of Social Gatherings with Friends and Family: Once a week  . Attends Religious Services: Never  . Active Member of Clubs or Organizations: No  . Attends Archivist Meetings: Never  . Marital Status: Divorced    Activities of Daily Living In your present state of health, do you have any difficulty performing the following activities: 10/07/2020 02/04/2020  Hearing? N N  Vision? N N  Difficulty concentrating or making decisions? Tempie Donning  Comment she is having difficulty with everything. due to medicine  Walking or climbing stairs? Y N  Comment sometimes -  Dressing or bathing? N N  Doing errands, shopping? N -  Preparing Food and  eating ? N -  Using the Toilet? N -  In the past six months, have you accidently leaked urine? N -  Do you have problems with loss of bowel control? N -  Managing your Medications? N -  Managing your Finances? N -  Housekeeping or managing your Housekeeping? N -  Some recent data might be hidden    Patient Education/ Literacy How often do you need to have someone help you when you read instructions, pamphlets, or other written materials from your doctor or pharmacy?: 1 - Never What is the last grade level you completed in school?: 12th grade.  Exercise Current Exercise Habits: Home exercise routine, Type of exercise: Other - see comments (yardwork), Time (Minutes): 40, Frequency (Times/Week): >7, Weekly Exercise (Minutes/Week): 0, Intensity: Mild, Exercise limited by: orthopedic condition(s)  Diet Patient reports consuming 2-3 meals a day and 0 snack(s) a day Patient reports that her primary diet is: Regular Patient reports that she does have regular access to food.   Depression Screen PHQ 2/9 Scores 10/07/2020 02/26/2020 07/16/2019 07/08/2019 10/30/2018 09/18/2018  PHQ - 2 Score 0 2 3 4 4 6   PHQ- 9 Score - 13 17 19 16 22   Some encounter information is confidential and restricted. Go to Review Flowsheets activity to see all data.     Fall Risk Fall Risk  10/07/2020 02/26/2020 07/16/2019  Falls in the past year? 1 1 1   Number falls in past yr: 1 1 1   Injury with Fall? 0 1 0  Risk for fall due to : No Fall Risks History of fall(s) History of fall(s);Impaired balance/gait;Medication side effect  Follow up Falls evaluation completed;Education provided Falls evaluation completed Falls prevention discussed     Objective:  [name] seemed alert and oriented and she participated appropriately during our telephone visit.  Blood Pressure Weight BMI  BP Readings from Last 3 Encounters:  09/07/20 126/72  03/30/20 101/74  02/26/20 117/65   Wt Readings from Last 3 Encounters:   08/24/20 136 lb (61.7 kg)  05/05/20 141 lb (64 kg)  03/30/20 141 lb (64 kg)   BMI Readings from Last 1 Encounters:  08/24/20 24.09 kg/m    *Unable to obtain current vital signs, weight, and BMI due to telephone visit type  Hearing/Vision  . Lacresia did not seem to have difficulty with hearing/understanding during the telephone conversation . Reports that she has had a formal eye exam by an eye care professional within the past year . Reports that she has not had a formal hearing evaluation within the past year *Unable to fully assess hearing and vision during telephone visit type  Cognitive Function: 6CIT Screen 10/07/2020 07/16/2019  What Year? 0 points 0 points  What month? 0 points 0 points  What time? 0 points 0 points  Count back from 20 0 points 0 points  Months in reverse 0 points 0 points  Repeat phrase 0 points 2 points  Total Score 0 2   (Normal:0-7, Significant for Dysfunction: >8)  Normal Cognitive Function Screening: Yes   Immunization & Health Maintenance Record Immunization History  Administered Date(s) Administered  . Pneumococcal Polysaccharide-23 05/12/2015  . Tdap 10/05/2017    Health Maintenance  Topic Date Due  . COVID-19 Vaccine (1) 10/23/2020 (Originally 02/08/1981)  . Pneumococcal Vaccine 68-47 Years old (1 of 4 - PCV13) 10/07/2021 (Originally 02/08/1982)  . COLONOSCOPY (Pts 45-55yrs Insurance coverage will need to be confirmed)  10/07/2021 (Originally 02/08/2019)  . MAMMOGRAM  06/30/2021  . Zoster Vaccines- Shingrix (1 of 2) 02/08/2024  . TETANUS/TDAP  10/06/2027  . Hepatitis C Screening  Completed  . HIV Screening  Completed  . HPV VACCINES  Aged Out  . INFLUENZA VACCINE  Discontinued       Assessment  This is a routine wellness examination for [employer]  Health Maintenance: Due or Overdue There are no preventive care reminders to display for this patient.  [name] does not need a referral for Community  Assistance: Care Management:   no Social Work:    no Prescription Assistance:  no Nutrition/Diabetes Education:  no   Plan:  Personalized Goals Goals Addressed              This Visit's Progress   .  Patient Stated (pt-stated)        10/07/2020 AWV Goal: Exercise for General Health   Patient will verbalize understanding of the benefits of increased physical activity:  Exercising regularly is important. It will improve your overall fitness, flexibility, and endurance.  Regular exercise also will improve your overall health. It can help you control your weight, reduce stress, and improve your bone density.  Over the next year, patient will increase physical activity as tolerated with a goal of at least 150 minutes of moderate physical activity per week.   You can tell that you are exercising at a moderate intensity if your heart starts beating faster and you start breathing faster but can still hold a conversation.  Moderate-intensity exercise ideas include:  Walking 1 mile (1.6 km) in about 15 minutes  Biking  Hiking  Golfing  Dancing  Water aerobics  Patient will verbalize understanding of everyday activities that increase physical activity by providing examples like the following: ? Yard work, such as: ? Pushing a Conservation officer, nature ? Raking and bagging leaves ? Washing your car ? Pushing a stroller ? Shoveling snow ? Gardening ? Washing windows or floors  Patient will be able to explain general safety guidelines for exercising:   Before you start a new exercise program, talk with your  health care provider.  Do not exercise so much that you hurt yourself, feel dizzy, or get very short of breath.  Wear comfortable clothes and wear shoes with good support.  Drink plenty of water while you exercise to prevent dehydration or heat stroke.  Work out until your breathing and your heartbeat get faster.       Personalized Health Maintenance & Screening  Recommendations  Screening mammography Colorectal cancer screening  Lung Cancer Screening Recommended: no (Low Dose CT Chest recommended if Age 25-80 years, 30 pack-year currently smoking OR have quit w/in past 15 years) Hepatitis C Screening recommended: no HIV Screening recommended: no  Advanced Directives: Written information was not prepared per patient's request.  Referrals & Orders Orders Placed This Encounter  Procedures  . Mammogram 3D SCREEN BREAST BILATERAL  . Ambulatory referral to Gastroenterology (for Colonoscopy)    Follow-up Plan . Follow-up with Donella Stade, PA-C as planned . Referral for your colonoscopy and mammogram has been sent. . Medicare wellness in one year. . AVS printed and mailed.   I have personally reviewed and noted the following in the patient's chart:   . Medical and social history . Use of alcohol, tobacco or illicit drugs  . Current medications and supplements . Functional ability and status . Nutritional status . Physical activity . Advanced directives . List of other physicians . Hospitalizations, surgeries, and ER visits in previous 12 months . Vitals . Screenings to include cognitive, depression, and falls . Referrals and appointments  In addition, I have reviewed and discussed with [name] certain preventive protocols, quality metrics, and best practice recommendations. A written personalized care plan for preventive services as well as general preventive health recommendations is available and can be mailed to the patient at her request.      Tinnie Gens, RN  10/07/2020

## 2020-10-07 NOTE — Patient Instructions (Addendum)
[place] Maintenance Summary and Written Plan of Care  Ms. Parlin ,  Thank you for allowing me to perform your Medicare Annual Wellness Visit and for your ongoing commitment to your health.   Health Maintenance & Immunization History Health Maintenance  Topic Date Due  . COVID-19 Vaccine (1) 10/23/2020 (Originally 02/08/1981)  . Pneumococcal Vaccine 56-47 Years old (1 of 4 - PCV13) 10/07/2021 (Originally 02/08/1982)  . COLONOSCOPY (Pts 45-71yrs Insurance coverage will need to be confirmed)  10/07/2021 (Originally 02/08/2019)  . MAMMOGRAM  06/30/2021  . Zoster Vaccines- Shingrix (1 of 2) 02/08/2024  . TETANUS/TDAP  10/06/2027  . Hepatitis C Screening  Completed  . HIV Screening  Completed  . HPV VACCINES  Aged Out  . INFLUENZA VACCINE  Discontinued   Immunization History  Administered Date(s) Administered  . Pneumococcal Polysaccharide-23 05/12/2015  . Tdap 10/05/2017    These are the patient goals that we discussed: Goals Addressed              This Visit's Progress   .  Patient Stated (pt-stated)        10/07/2020 AWV Goal: Exercise for General Health   Patient will verbalize understanding of the benefits of increased physical activity:  Exercising regularly is important. It will improve your overall fitness, flexibility, and endurance.  Regular exercise also will improve your overall health. It can help you control your weight, reduce stress, and improve your bone density.  Over the next year, patient will increase physical activity as tolerated with a goal of at least 150 minutes of moderate physical activity per week.   You can tell that you are exercising at a moderate intensity if your heart starts beating faster and you start breathing faster but can still hold a conversation.  Moderate-intensity exercise ideas include:  Walking 1 mile (1.6 km) in about 15 minutes  Biking  Hiking  Golfing  Dancing  Water  aerobics  Patient will verbalize understanding of everyday activities that increase physical activity by providing examples like the following: ? Yard work, such as: ? Pushing a Conservation officer, nature ? Raking and bagging leaves ? Washing your car ? Pushing a stroller ? Shoveling snow ? Gardening ? Washing windows or floors  Patient will be able to explain general safety guidelines for exercising:   Before you start a new exercise program, talk with your health care provider.  Do not exercise so much that you hurt yourself, feel dizzy, or get very short of breath.  Wear comfortable clothes and wear shoes with good support.  Drink plenty of water while you exercise to prevent dehydration or heat stroke.  Work out until your breathing and your heartbeat get faster.         This is a list of Health Maintenance Items that are overdue or due now: Screening mammography Colorectal cancer screening  Orders/Referrals Placed Today: Orders Placed This Encounter  Procedures  . Mammogram 3D SCREEN BREAST BILATERAL    Standing Status:   Future    Standing Expiration Date:   10/07/2021    Scheduling Instructions:     Please call patient to schedule.    Order Specific Question:   Reason for Exam (SYMPTOM  OR DIAGNOSIS REQUIRED)    Answer:   Breast cancer screening    Order Specific Question:   Is the patient pregnant?    Answer:   No    Order Specific Question:   Preferred imaging location?    Answer:  [facility]  . Ambulatory referral to Gastroenterology (for Colonoscopy)    Referral Priority:   Routine    Referral Type:   Consultation    Referral Reason:   Specialty Services Required    Number of Visits Requested:   1   (Contact our referral department at [phone] if you have not spoken with someone about your referral appointment within the next 5 days)    Follow-up Plan . Follow-up with Donella Stade, PA-C as planned . Referral for your colonoscopy and mammogram has  been sent. . Medicare wellness in one year. . AVS printed and mailed.

## 2020-10-12 DIAGNOSIS — J302 Other seasonal allergic rhinitis: Secondary | ICD-10-CM | POA: Diagnosis not present

## 2020-10-12 DIAGNOSIS — L209 Atopic dermatitis, unspecified: Secondary | ICD-10-CM | POA: Diagnosis not present

## 2020-10-12 DIAGNOSIS — J4540 Moderate persistent asthma, uncomplicated: Secondary | ICD-10-CM | POA: Diagnosis not present

## 2020-10-12 DIAGNOSIS — J3089 Other allergic rhinitis: Secondary | ICD-10-CM | POA: Diagnosis not present

## 2020-10-12 DIAGNOSIS — Z91018 Allergy to other foods: Secondary | ICD-10-CM | POA: Diagnosis not present

## 2020-10-14 ENCOUNTER — Ambulatory Visit: Payer: Medicare Other

## 2020-10-14 DIAGNOSIS — H52229 Regular astigmatism, unspecified eye: Secondary | ICD-10-CM | POA: Diagnosis not present

## 2020-10-14 DIAGNOSIS — M5459 Other low back pain: Secondary | ICD-10-CM | POA: Diagnosis not present

## 2020-10-14 DIAGNOSIS — H52223 Regular astigmatism, bilateral: Secondary | ICD-10-CM | POA: Diagnosis not present

## 2020-10-14 DIAGNOSIS — H43393 Other vitreous opacities, bilateral: Secondary | ICD-10-CM | POA: Diagnosis not present

## 2020-10-14 DIAGNOSIS — H524 Presbyopia: Secondary | ICD-10-CM | POA: Diagnosis not present

## 2020-10-14 DIAGNOSIS — M545 Low back pain, unspecified: Secondary | ICD-10-CM | POA: Diagnosis not present

## 2020-10-14 DIAGNOSIS — H5213 Myopia, bilateral: Secondary | ICD-10-CM | POA: Diagnosis not present

## 2020-10-15 ENCOUNTER — Ambulatory Visit: Payer: Medicare Other

## 2020-10-21 ENCOUNTER — Telehealth: Payer: Self-pay | Admitting: Neurology

## 2020-10-21 DIAGNOSIS — J3089 Other allergic rhinitis: Secondary | ICD-10-CM | POA: Diagnosis not present

## 2020-10-21 NOTE — Telephone Encounter (Signed)
Received vm transferred to me where patient is asking for appt to evaluate pink eye. Please call patient to schedule appt. Any provider.

## 2020-10-21 NOTE — Telephone Encounter (Signed)
Left voicemail for patient to call back to get this appt scheduled. AM

## 2020-10-26 DIAGNOSIS — L209 Atopic dermatitis, unspecified: Secondary | ICD-10-CM | POA: Diagnosis not present

## 2020-10-26 DIAGNOSIS — Z9109 Other allergy status, other than to drugs and biological substances: Secondary | ICD-10-CM | POA: Diagnosis not present

## 2020-10-26 DIAGNOSIS — T50905S Adverse effect of unspecified drugs, medicaments and biological substances, sequela: Secondary | ICD-10-CM | POA: Diagnosis not present

## 2020-10-26 DIAGNOSIS — J4540 Moderate persistent asthma, uncomplicated: Secondary | ICD-10-CM | POA: Diagnosis not present

## 2020-10-26 DIAGNOSIS — Z91018 Allergy to other foods: Secondary | ICD-10-CM | POA: Diagnosis not present

## 2020-10-27 DIAGNOSIS — M5416 Radiculopathy, lumbar region: Secondary | ICD-10-CM | POA: Diagnosis not present

## 2020-10-27 DIAGNOSIS — M961 Postlaminectomy syndrome, not elsewhere classified: Secondary | ICD-10-CM | POA: Diagnosis not present

## 2020-10-27 DIAGNOSIS — M47896 Other spondylosis, lumbar region: Secondary | ICD-10-CM | POA: Diagnosis not present

## 2020-10-27 DIAGNOSIS — M5126 Other intervertebral disc displacement, lumbar region: Secondary | ICD-10-CM | POA: Diagnosis not present

## 2020-10-27 DIAGNOSIS — Z79899 Other long term (current) drug therapy: Secondary | ICD-10-CM | POA: Diagnosis not present

## 2020-10-27 DIAGNOSIS — F17210 Nicotine dependence, cigarettes, uncomplicated: Secondary | ICD-10-CM | POA: Diagnosis not present

## 2020-10-29 DIAGNOSIS — Z791 Long term (current) use of non-steroidal anti-inflammatories (NSAID): Secondary | ICD-10-CM | POA: Diagnosis not present

## 2020-10-29 DIAGNOSIS — Z91012 Allergy to eggs: Secondary | ICD-10-CM | POA: Diagnosis not present

## 2020-10-29 DIAGNOSIS — Z23 Encounter for immunization: Secondary | ICD-10-CM | POA: Diagnosis not present

## 2020-10-29 DIAGNOSIS — Z888 Allergy status to other drugs, medicaments and biological substances status: Secondary | ICD-10-CM | POA: Diagnosis not present

## 2020-10-29 DIAGNOSIS — Z79899 Other long term (current) drug therapy: Secondary | ICD-10-CM | POA: Diagnosis not present

## 2020-10-29 DIAGNOSIS — Z7951 Long term (current) use of inhaled steroids: Secondary | ICD-10-CM | POA: Diagnosis not present

## 2020-10-29 DIAGNOSIS — S91012A Laceration without foreign body, left ankle, initial encounter: Secondary | ICD-10-CM | POA: Diagnosis not present

## 2020-10-29 DIAGNOSIS — F17210 Nicotine dependence, cigarettes, uncomplicated: Secondary | ICD-10-CM | POA: Diagnosis not present

## 2020-10-29 DIAGNOSIS — G8911 Acute pain due to trauma: Secondary | ICD-10-CM | POA: Diagnosis not present

## 2020-10-29 DIAGNOSIS — Z91041 Radiographic dye allergy status: Secondary | ICD-10-CM | POA: Diagnosis not present

## 2020-10-29 DIAGNOSIS — J45909 Unspecified asthma, uncomplicated: Secondary | ICD-10-CM | POA: Diagnosis not present

## 2020-10-30 DIAGNOSIS — T7840XD Allergy, unspecified, subsequent encounter: Secondary | ICD-10-CM | POA: Diagnosis not present

## 2020-11-03 DIAGNOSIS — T7840XD Allergy, unspecified, subsequent encounter: Secondary | ICD-10-CM | POA: Diagnosis not present

## 2020-11-06 DIAGNOSIS — T7840XD Allergy, unspecified, subsequent encounter: Secondary | ICD-10-CM | POA: Diagnosis not present

## 2020-11-10 ENCOUNTER — Ambulatory Visit (INDEPENDENT_AMBULATORY_CARE_PROVIDER_SITE_OTHER): Payer: Medicare Other | Admitting: Physician Assistant

## 2020-11-10 ENCOUNTER — Ambulatory Visit (INDEPENDENT_AMBULATORY_CARE_PROVIDER_SITE_OTHER): Payer: Medicare Other

## 2020-11-10 ENCOUNTER — Other Ambulatory Visit: Payer: Self-pay

## 2020-11-10 ENCOUNTER — Encounter: Payer: Self-pay | Admitting: Physician Assistant

## 2020-11-10 VITALS — BP 109/70 | HR 92 | Ht 63.0 in | Wt 141.0 lb

## 2020-11-10 DIAGNOSIS — S8991XA Unspecified injury of right lower leg, initial encounter: Secondary | ICD-10-CM | POA: Diagnosis not present

## 2020-11-10 DIAGNOSIS — T7840XD Allergy, unspecified, subsequent encounter: Secondary | ICD-10-CM | POA: Diagnosis not present

## 2020-11-10 DIAGNOSIS — M25561 Pain in right knee: Secondary | ICD-10-CM

## 2020-11-10 NOTE — Patient Instructions (Signed)
Patellofemoral Pain Syndrome Patellofemoral pain syndrome is a condition in which the tissue (cartilage) on the underside of the kneecap (patella) softens or breaks down. This causes pain in the front of the knee. The condition is also called runner's knee or chondromalacia patella. Patellofemoral pain syndrome is most common in young adults who are active in sports. The knee is the largest joint in the body. The patella covers the front of the knee and is attached to muscles above and below the knee. The underside of the patella is covered with a smooth type of cartilage (synovium). The smooth surface helps the patella glide easily when you move your knee. Patellofemoral pain syndrome causes swelling in the joint linings and bone surfaces in the knee. What are the causes? This condition may be caused by: Overuse of the knee. Poor alignment of your knee joints. Weak leg muscles. A direct hit to your kneecap. What increases the risk? You are more likely to develop this condition if: You do a lot of activities that can wear down your kneecap. These include: Running. Squatting. Climbing stairs. You start a new physical activity or exercise program. You wear shoes that do not fit well. You do not have good leg strength. You are overweight. What are the signs or symptoms? The main symptom of this condition is knee pain. This may feel like a dull, aching pain underneath your patella, in the front of your knee. There may be a popping or cracking sound when you move your knee. Pain may get worse with: Exercise. Climbing stairs. Running. Jumping. Squatting. Kneeling. Sitting for a long time. Moving or pushing on your patella. How is this diagnosed? This condition may be diagnosed based on: Your symptoms and medical history. You may be asked about your recent physical activities and which ones cause knee pain. A physical exam. This may include: Moving your patella back and forth. Checking  your range of knee motion. Having you squat or jump to see if you have pain. Checking the strength of your leg muscles. Imaging tests to confirm the diagnosis. These may include an MRI of your knee. How is this treated? This condition may be treated at home with rest, ice, compression, and elevation (RICE).  Other treatments may include: NSAIDs, such as ibuprofen. Physical therapy to stretch and strengthen your leg muscles. Shoe inserts (orthotics) to take stress off your knee. A knee brace or knee support. Adhesive tapes to the skin. Surgery to remove damaged cartilage or move the patella to a better position. This is rare. Follow these instructions at home: If you have a brace: Wear the brace as told by your health care provider. Remove it only as told by your health care provider. Loosen the brace if your toes tingle, become numb, or turn cold and blue. Keep the brace clean. If the brace is not waterproof: Do not let it get wet. Cover it with a watertight covering when you take a bath or a shower. Managing pain, stiffness, and swelling  If directed, put ice on the painful area. To do this: If you have a removable brace, remove it as told by your health care provider. Put ice in a plastic bag. Place a towel between your skin and the bag. Leave the ice on for 20 minutes, 2-3 times a day. Remove the ice if your skin turns bright red. This is very important. If you cannot feel pain, heat, or cold, you have a greater risk of damage to the area. Move   your toes often to reduce stiffness and swelling. Raise (elevate) the injured area above the level of your heart while you are sitting or lying down. Activity Rest your knee. Avoid activities that cause knee pain. Perform stretching and strengthening exercises as told by your health care provider or physical therapist. Return to your normal activities as told by your health care provider. Ask your health care provider what activities are  safe for you. General instructions Take over-the-counter and prescription medicines only as told by your health care provider. Use splints, braces, knee supports, or walking aids as directed by your health care provider. Do not use any products that contain nicotine or tobacco, such as cigarettes, e-cigarettes, and chewing tobacco. These can delay healing. If you need help quitting, ask your health care provider. Keep all follow-up visits. This is important. Contact a health care provider if: Your symptoms get worse. You are not improving with home care. Summary Patellofemoral pain syndrome is a condition in which the tissue (cartilage) on the underside of the kneecap (patella) softens or breaks down. This condition causes swelling in the joint linings and bone surfaces in the knee. This leads to pain in the front of the knee. This condition may be treated at home with rest, ice, compression, and elevation (RICE). Use splints, braces, knee supports, or walking aids as directed by your health care provider. This information is not intended to replace advice given to you by your health care provider. Make sure you discuss any questions you have with your health care provider. Document Revised: 10/02/2019 Document Reviewed: 10/02/2019 Elsevier Patient Education  2022 Elsevier Inc.  

## 2020-11-10 NOTE — Progress Notes (Signed)
Subjective:    Patient ID: [name], female    DOB: [birth year], 45 y.o.   MRN: [id number]  HPI Pt is a 45 yo female with hx of right knee pain and surgical right knee arthroscopy with partial meniscectomy and subcondroplasty in 01/2020 who presents to the clinic with right knee injury yesterday and increased pain. Yesterday she was taking the trash to the road when trash can fell back and she tried to catch it while twisting her right knee and falling forward. She is concerned about injury. She does not know where her knee brace is. It is swollen some and hurts walking and more going up stairs. On pain contract and taking medication along with lyrica. Cannot tolerate GI side effects of oral NSAIDs.   [name]. Active Ambulatory Problems    Diagnosis Date Noted   Anxiety state 07/06/2007   Smoker 02/05/2010   Depressed 06/26/2008   INTERSTITIAL CYSTITIS 02/05/2010   Insomnia 07/06/2007   FATIGUE 07/02/2009   HEADACHE 07/06/2007   TROCHANTERIC BURSITIS 06/21/2010   Tick-borne disease 08/09/2010   PTSD (post-traumatic stress disorder) 12/14/2012   Weight gain 11/29/2013   Greater trochanteric bursitis 11/29/2013   Constipation due to opioid therapy 01/17/2014   Left shoulder pain 04/02/2014   Carpal tunnel syndrome of left wrist 05/05/2014   Myofascial pain syndrome 05/29/2014   Paresthesia 06/03/2014   Endometriosis 07/20/2014   Tachycardia 08/13/2014   S/P cervical spinal fusion 10/28/2014   Therapeutic opioid induced constipation 12/16/2014   Vasomotor flushing 06/15/2015   GAD (generalized anxiety disorder) 06/15/2015   Fixed pupils 08/31/2015   Dysuria 08/31/2015   Depression 08/31/2015   Primary osteoarthritis of left first metacarpophalangeal joint 04/04/2016   Palpitations 04/14/2016   Balance problems 07/19/2016   Dizziness 07/19/2016   Primary osteoarthritis of right knee 09/05/2016   Trigger thumb, left thumb 10/06/2016   Chronic pain syndrome 01/08/2017    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   First degree ankle sprain, left, initial encounter 08/29/2017   Hypertension 10/06/2017   Hx of cold sores 10/06/2017   Asthma 01/29/2018   Chronic neck pain 01/29/2018   Traumatic ecchymosis of left foot 10/15/2018   Mild episode of recurrent major depressive disorder ([place]) 10/30/2018   Dog bite 02/15/2019   Leukocytes in urine 03/04/2019   Other microscopic hematuria 03/04/2019   Left ovarian cyst 03/04/2019   RLS (restless legs syndrome) 07/08/2019   Mood changes 08/25/2019   Pes anserine bursitis 09/03/2019   Nodule right fifth PIP of hand 09/03/2019   Impingement syndrome, shoulder, left 12/30/2019   COVID-19 virus infection 01/14/2020   Intractable right heel pain 05/04/2020   Numbness of right great toe 06/23/2020   Injury of right knee 11/14/2020   Acute pain of right knee 11/14/2020   Resolved Ambulatory Problems    Diagnosis Date Noted   RHINITIS 10/13/2009   Polyuria 11/04/2010   Degenerative disc disease, cervical 10/31/2013   Cervical disc disorder with radiculopathy of cervical region 11/29/2013   Itching 11/29/2013   Radiculitis of left cervical region 01/17/2014   Nausea with vomiting 11/25/2014   Dysmenorrhea 05/11/2015   Cervical disc disorder with radiculopathy of cervical region 11/11/2015   Acute upper respiratory infection 04/04/2016   Localized primary carpometacarpal osteoarthritis, left 04/04/2016   Cervical post-laminectomy syndrome 07/08/2019   Past Medical History:  Diagnosis Date   Acute medial meniscus tear of right knee    Anxiety    Arthritis    Complication of anesthesia  COVID-19 01/15/2020   Dysrhythmia    Elevated heart rate and blood pressure    Family history of adverse reaction to anesthesia    Fibromyalgia    GERD (gastroesophageal reflux disease)    IC (interstitial cystitis)    Myofascial pain syndrome, cervical    Neck pain    Ovarian cyst    PONV (postoperative nausea and vomiting)      Review of Systems See HPI.     Objective:   Physical Exam Vitals reviewed.  Musculoskeletal:     Comments: Pain with full extension of right leg in the knee.  Scant swelling in the suprapatellar area.  Tenderness focused around patella and medial joint space to palpation.  Anterior draw seemed to be intact.  Pain with [place] medially.   Neurological:     Mental Status: She is alert.          Assessment & Plan:  .Marland KitchenMetztli was seen today for knee pain.  Diagnoses and all orders for this visit:  Acute pain of right knee -     DG Knee Complete 4 Views Right; Future  Injury of right knee, initial encounter   ? Could be some patellofemoral pain behind patella and/or some soft tissue meniscal injury. Overall knee appears stable today.  Placed in knee brace.  Will get xray for any acute bone damage.  Ice, topical NSAID with rest.  Follow up with Dr. Darene Flynn or ortho in 2-4 weeks if no improvement or worsening of symptoms.

## 2020-11-11 DIAGNOSIS — M25561 Pain in right knee: Secondary | ICD-10-CM | POA: Diagnosis not present

## 2020-11-13 ENCOUNTER — Other Ambulatory Visit: Payer: Self-pay | Admitting: Physician Assistant

## 2020-11-13 DIAGNOSIS — F411 Generalized anxiety disorder: Secondary | ICD-10-CM

## 2020-11-13 DIAGNOSIS — T7840XD Allergy, unspecified, subsequent encounter: Secondary | ICD-10-CM | POA: Diagnosis not present

## 2020-11-13 DIAGNOSIS — M25561 Pain in right knee: Secondary | ICD-10-CM | POA: Diagnosis not present

## 2020-11-13 NOTE — Progress Notes (Signed)
No acute findings on knee xray.

## 2020-11-14 ENCOUNTER — Encounter: Payer: Self-pay | Admitting: Physician Assistant

## 2020-11-14 DIAGNOSIS — M25561 Pain in right knee: Secondary | ICD-10-CM | POA: Insufficient documentation

## 2020-11-14 DIAGNOSIS — G8929 Other chronic pain: Secondary | ICD-10-CM | POA: Insufficient documentation

## 2020-11-14 DIAGNOSIS — S8991XA Unspecified injury of right lower leg, initial encounter: Secondary | ICD-10-CM | POA: Insufficient documentation

## 2020-11-17 DIAGNOSIS — T7840XD Allergy, unspecified, subsequent encounter: Secondary | ICD-10-CM | POA: Diagnosis not present

## 2020-11-17 DIAGNOSIS — M25561 Pain in right knee: Secondary | ICD-10-CM | POA: Diagnosis not present

## 2020-11-18 ENCOUNTER — Other Ambulatory Visit: Payer: Self-pay

## 2020-11-18 ENCOUNTER — Ambulatory Visit (INDEPENDENT_AMBULATORY_CARE_PROVIDER_SITE_OTHER): Payer: Medicare Other

## 2020-11-18 DIAGNOSIS — Z1231 Encounter for screening mammogram for malignant neoplasm of breast: Secondary | ICD-10-CM | POA: Diagnosis not present

## 2020-11-18 DIAGNOSIS — Z0000 Encounter for general adult medical examination without abnormal findings: Secondary | ICD-10-CM

## 2020-11-19 ENCOUNTER — Encounter: Payer: Self-pay | Admitting: Physician Assistant

## 2020-11-19 DIAGNOSIS — M47816 Spondylosis without myelopathy or radiculopathy, lumbar region: Secondary | ICD-10-CM | POA: Diagnosis not present

## 2020-11-20 ENCOUNTER — Encounter: Payer: Self-pay | Admitting: Neurology

## 2020-11-23 DIAGNOSIS — M5416 Radiculopathy, lumbar region: Secondary | ICD-10-CM | POA: Diagnosis not present

## 2020-11-23 DIAGNOSIS — T7840XD Allergy, unspecified, subsequent encounter: Secondary | ICD-10-CM | POA: Diagnosis not present

## 2020-11-23 DIAGNOSIS — M961 Postlaminectomy syndrome, not elsewhere classified: Secondary | ICD-10-CM | POA: Diagnosis not present

## 2020-11-23 DIAGNOSIS — H52223 Regular astigmatism, bilateral: Secondary | ICD-10-CM | POA: Diagnosis not present

## 2020-11-23 DIAGNOSIS — Z79899 Other long term (current) drug therapy: Secondary | ICD-10-CM | POA: Diagnosis not present

## 2020-11-23 DIAGNOSIS — M5136 Other intervertebral disc degeneration, lumbar region: Secondary | ICD-10-CM | POA: Diagnosis not present

## 2020-11-24 DIAGNOSIS — M25561 Pain in right knee: Secondary | ICD-10-CM | POA: Diagnosis not present

## 2020-11-24 DIAGNOSIS — J3089 Other allergic rhinitis: Secondary | ICD-10-CM | POA: Diagnosis not present

## 2020-11-27 DIAGNOSIS — T7840XD Allergy, unspecified, subsequent encounter: Secondary | ICD-10-CM | POA: Diagnosis not present

## 2020-12-01 DIAGNOSIS — T7840XD Allergy, unspecified, subsequent encounter: Secondary | ICD-10-CM | POA: Diagnosis not present

## 2020-12-04 DIAGNOSIS — T7840XD Allergy, unspecified, subsequent encounter: Secondary | ICD-10-CM | POA: Diagnosis not present

## 2020-12-08 DIAGNOSIS — T7840XD Allergy, unspecified, subsequent encounter: Secondary | ICD-10-CM | POA: Diagnosis not present

## 2020-12-08 DIAGNOSIS — M5412 Radiculopathy, cervical region: Secondary | ICD-10-CM | POA: Diagnosis not present

## 2020-12-11 DIAGNOSIS — T7840XD Allergy, unspecified, subsequent encounter: Secondary | ICD-10-CM | POA: Diagnosis not present

## 2020-12-23 DIAGNOSIS — T7840XD Allergy, unspecified, subsequent encounter: Secondary | ICD-10-CM | POA: Diagnosis not present

## 2020-12-25 ENCOUNTER — Encounter: Payer: Self-pay | Admitting: Physician Assistant

## 2020-12-25 ENCOUNTER — Telehealth (INDEPENDENT_AMBULATORY_CARE_PROVIDER_SITE_OTHER): Payer: Medicare Other | Admitting: Physician Assistant

## 2020-12-25 VITALS — Wt 137.0 lb

## 2020-12-25 DIAGNOSIS — G4700 Insomnia, unspecified: Secondary | ICD-10-CM

## 2020-12-25 DIAGNOSIS — F439 Reaction to severe stress, unspecified: Secondary | ICD-10-CM

## 2020-12-25 DIAGNOSIS — F419 Anxiety disorder, unspecified: Secondary | ICD-10-CM | POA: Diagnosis not present

## 2020-12-25 DIAGNOSIS — F411 Generalized anxiety disorder: Secondary | ICD-10-CM

## 2020-12-25 MED ORDER — QUETIAPINE FUMARATE 25 MG PO TABS: ORAL_TABLET | ORAL | 1 refills | Status: DC

## 2020-12-25 NOTE — Progress Notes (Signed)
Patient ID: [name], female   DOB: Nov 25, 1975, 45 y.o.   MRN: [id number] .Marland KitchenVirtual Visit via Video Note  I connected with [name] on 12/29/20 at 11:10 AM EDT by a video enabled telemedicine application and verified that I am speaking with the correct person using two identifiers.  Location: Patient: home Provider: clinic  .Marland KitchenParticipating in visit:  Patient: [name] Provider: Iran Planas PA-C   I discussed the limitations of evaluation and management by telemedicine and the availability of in person appointments. The patient expressed understanding and agreed to proceed.  History of Present Illness: Pt is a 45 yo female with anxiety, depression who calls into the clinic with worsening anxiety and depression.   She has a lot of stressors at home right now.  Her son is in high school but functioning and reading at a first grade level.  She is concerned he is going to get in a lot of trouble because he just cannot do the work.  She cannot afford to get him into a special school.  Her daughter is moving out.  She is having a lot of trouble sleeping.  Ambien just is not helping.  Denies any suicidal thoughts or homicidal idealizations.    .. Active Ambulatory Problems    Diagnosis Date Noted   Anxiety state 07/06/2007   Smoker 02/05/2010   Depressed 06/26/2008   INTERSTITIAL CYSTITIS 02/05/2010   Insomnia 07/06/2007   FATIGUE 07/02/2009   HEADACHE 07/06/2007   TROCHANTERIC BURSITIS 06/21/2010   Tick-borne disease 08/09/2010   PTSD (post-traumatic stress disorder) 12/14/2012   Weight gain 11/29/2013   Greater trochanteric bursitis 11/29/2013   Constipation due to opioid therapy 01/17/2014   Left shoulder pain 04/02/2014   Carpal tunnel syndrome of left wrist 05/05/2014   Myofascial pain syndrome 05/29/2014   Paresthesia 06/03/2014   Endometriosis 07/20/2014   Tachycardia 08/13/2014   S/P cervical spinal fusion 10/28/2014   Therapeutic opioid  induced constipation 12/16/2014   Vasomotor flushing 06/15/2015   GAD (generalized anxiety disorder) 06/15/2015   Fixed pupils 08/31/2015   Dysuria 08/31/2015   Depression 08/31/2015   Primary osteoarthritis of left first metacarpophalangeal joint 04/04/2016   Palpitations 04/14/2016   Balance problems 07/19/2016   Dizziness 07/19/2016   Primary osteoarthritis of right knee 09/05/2016   Trigger thumb, left thumb 10/06/2016   Chronic pain syndrome 01/08/2017   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   First degree ankle sprain, left, initial encounter 08/29/2017   Hypertension 10/06/2017   Hx of cold sores 10/06/2017   Asthma 01/29/2018   Chronic neck pain 01/29/2018   Traumatic ecchymosis of left foot 10/15/2018   Mild episode of recurrent major depressive disorder ([place]) 10/30/2018   Dog bite 02/15/2019   Leukocytes in urine 03/04/2019   Other microscopic hematuria 03/04/2019   Left ovarian cyst 03/04/2019   RLS (restless legs syndrome) 07/08/2019   Mood changes 08/25/2019   Pes anserine bursitis 09/03/2019   Nodule right fifth PIP of hand 09/03/2019   Impingement syndrome, shoulder, left 12/30/2019   COVID-19 virus infection 01/14/2020   Intractable right heel pain 05/04/2020   Numbness of right great toe 06/23/2020   Injury of right knee 11/14/2020   Acute pain of right knee 11/14/2020   Stress at home 12/29/2020   Resolved Ambulatory Problems    Diagnosis Date Noted   RHINITIS 10/13/2009   Polyuria 11/04/2010   Degenerative disc disease, cervical 10/31/2013   Cervical disc disorder with radiculopathy of cervical region 11/29/2013   Itching  11/29/2013   Radiculitis of left cervical region 01/17/2014   Nausea with vomiting 11/25/2014   Dysmenorrhea 05/11/2015   Cervical disc disorder with radiculopathy of cervical region 11/11/2015   Acute upper respiratory infection 04/04/2016   Localized primary carpometacarpal osteoarthritis, left 04/04/2016   Cervical post-laminectomy syndrome  07/08/2019   Past Medical History:  Diagnosis Date   Acute medial meniscus tear of right knee    Anxiety    Arthritis    Complication of anesthesia    COVID-19 01/15/2020   Dysrhythmia    Elevated heart rate and blood pressure    Family history of adverse reaction to anesthesia    Fibromyalgia    GERD (gastroesophageal reflux disease)    IC (interstitial cystitis)    Myofascial pain syndrome, cervical    Neck pain    Ovarian cyst    PONV (postoperative nausea and vomiting)     Observations/Objective: No acute distress Normal breathing Normal mood and appearance Tearful at some points.   .. Depression screen [facility] 2/9 12/25/2020 12/25/2020 10/07/2020 02/26/2020 07/16/2019  Decreased Interest 0 3 0 1 1  Down, Depressed, Hopeless 0 3 - 1 2  PHQ - 2 Score 0 6 0 2 3  Altered sleeping - 3 - 2 3  Tired, decreased energy - 3 - 2 3  Change in appetite - 3 - 1 3  Feeling bad or failure about yourself  - 0 - 1 2  Trouble concentrating - 0 - 3 3  Moving slowly or fidgety/restless - 3 - 2 0  Suicidal thoughts - 0 - 0 0  PHQ-9 Score - 18 - 13 17  Difficult doing work/chores - - - - Extremely dIfficult  Some encounter information is confidential and restricted. Go to Review Flowsheets activity to see all data.  Some recent data might be hidden   .[name] GAD 7 : Generalized Anxiety Score 12/25/2020 02/26/2020 07/08/2019 10/30/2018  Nervous, Anxious, on Edge '3 2 3 3  '$ Control/stop worrying '3 2 3 3  '$ Worry too much - different things '3 3 3 3  '$ Trouble relaxing '3 3 3 3  '$ Restless '3 3 3 3  '$ Easily annoyed or irritable '3 1 3 3  '$ Afraid - awful might happen '3 3 3 3  '$ Total GAD 7 Score '21 17 21 21  '$ Anxiety Difficulty Not difficult at all - - Extremely difficult  Some encounter information is confidential and restricted. Go to Review Flowsheets activity to see all data.     Assessment and Plan: .Marland KitchenDaryn was seen today for anxiety.  Diagnoses and all orders for this visit:  Anxiety -      QUEtiapine (SEROQUEL) 25 MG tablet; Take 1-2 tablets 58mnutes to 1 hour before bed for mood and sleep.  Stress at home  GAD (generalized anxiety disorder)  Insomnia, unspecified type -     QUEtiapine (SEROQUEL) 25 MG tablet; Take 1-2 tablets 327mutes to 1 hour before bed for mood and sleep.  Discussed stress/anxiety/sleep. I do not want to start on benzos. Lets target sleep first. She is on [place] it is not working. Stop ambien. Start seroquel. This can help with mood and sleep. Follow up in 4 weeks.  Continue wellbutrin.  Strongly recommended counseling to work through her life changes.    Follow Up Instructions:    I discussed the assessment and treatment plan with the patient. The patient was provided an opportunity to ask questions and all were answered. The patient agreed with the plan and  demonstrated an understanding of the instructions.   The patient was advised to call back or seek an in-person evaluation if the symptoms worsen or if the condition fails to improve as anticipated.     Iran Planas, PA-C

## 2020-12-25 NOTE — Progress Notes (Signed)
Patient would like to discuss increased anxiety. PHQ-9 abd GAD 7 completed over phone

## 2020-12-28 DIAGNOSIS — J3089 Other allergic rhinitis: Secondary | ICD-10-CM | POA: Diagnosis not present

## 2020-12-29 DIAGNOSIS — F439 Reaction to severe stress, unspecified: Secondary | ICD-10-CM | POA: Insufficient documentation

## 2020-12-30 DIAGNOSIS — K648 Other hemorrhoids: Secondary | ICD-10-CM | POA: Diagnosis not present

## 2020-12-30 DIAGNOSIS — D122 Benign neoplasm of ascending colon: Secondary | ICD-10-CM | POA: Diagnosis not present

## 2020-12-30 DIAGNOSIS — Z1211 Encounter for screening for malignant neoplasm of colon: Secondary | ICD-10-CM | POA: Diagnosis not present

## 2020-12-30 DIAGNOSIS — D124 Benign neoplasm of descending colon: Secondary | ICD-10-CM | POA: Diagnosis not present

## 2020-12-30 LAB — HM COLONOSCOPY

## 2021-01-01 ENCOUNTER — Telehealth (INDEPENDENT_AMBULATORY_CARE_PROVIDER_SITE_OTHER): Payer: Medicare Other | Admitting: Medical-Surgical

## 2021-01-01 ENCOUNTER — Encounter: Payer: Self-pay | Admitting: Medical-Surgical

## 2021-01-01 DIAGNOSIS — J019 Acute sinusitis, unspecified: Secondary | ICD-10-CM | POA: Diagnosis not present

## 2021-01-01 MED ORDER — IPRATROPIUM BROMIDE 0.03 % NA SOLN: 2.0000 | Freq: Two times a day (BID) | NASAL | 0 refills | Status: DC

## 2021-01-01 MED ORDER — AZITHROMYCIN 250 MG PO TABS: ORAL_TABLET | ORAL | 0 refills | Status: DC

## 2021-01-01 NOTE — Progress Notes (Signed)
Virtual Visit via Video Note  I connected with [name] on 01/01/21 at 11:10 AM EDT by a video enabled telemedicine application and verified that I am speaking with the correct person using two identifiers.   I discussed the limitations of evaluation and management by telemedicine and the availability of in person appointments. The patient expressed understanding and agreed to proceed.  Patient location: home Provider locations: office  Subjective:    CC: sinus issues  HPI: Pleasant 45 year old female presenting today with complaints of sinus symptoms including nasal congestion, sinus pressure, sore throat, and a nighttime cough that is productive of green sputum.  She has been sneezing and feels that her congestion is now moving to her chest.  Notes that her temperature last night was 99.2 which she considers a fever.  Her symptoms started on Tuesday evening.  She has not COVID tested.  Has not been using any over-the-counter medications to help with her symptoms so far.  She does have a history of recurrent sinus infections related to seasonal allergies.  Past medical history, Surgical history, Family history not pertinant except as noted below, Social history, Allergies, and medications have been entered into the medical record, reviewed, and corrections made.   Review of Systems: See HPI for pertinent positives and negatives.   Objective:    General: Speaking clearly in complete sentences without any shortness of breath.  Alert and oriented x3.  Normal judgment. No apparent acute distress.  Impression and Recommendations:    1. Acute non-recurrent sinusitis, unspecified location Suspect symptoms are related to a viral issue but she does have a history of recurrent sinusitis.  Advised COVID testing either using a home test or via a pharmacy drive up.  With her history, I will go ahead and send in azithromycin but recommend starting it on Sunday if no improvement in  symptoms.  Sending in Atrovent nasal spray to help with congestion.  Discussed symptomatic treatment using over-the-counter options.  Patient verbalized that these never work for her and she does not get them because of it.  I discussed the assessment and treatment plan with the patient. The patient was provided an opportunity to ask questions and all were answered. The patient agreed with the plan and demonstrated an understanding of the instructions.   The patient was advised to call back or seek an in-person evaluation if the symptoms worsen or if the condition fails to improve as anticipated.  20 minutes of non-face-to-face time was provided during this encounter.  Return if symptoms worsen or fail to improve.  Clearnce Sorrel, DNP, APRN, FNP-BC [place] [facility]

## 2021-01-02 DIAGNOSIS — Z20822 Contact with and (suspected) exposure to covid-19: Secondary | ICD-10-CM | POA: Diagnosis not present

## 2021-01-05 DIAGNOSIS — M7522 Bicipital tendinitis, left shoulder: Secondary | ICD-10-CM | POA: Diagnosis not present

## 2021-01-20 ENCOUNTER — Other Ambulatory Visit: Payer: Self-pay | Admitting: Physician Assistant

## 2021-01-20 DIAGNOSIS — G4700 Insomnia, unspecified: Secondary | ICD-10-CM

## 2021-01-20 DIAGNOSIS — F419 Anxiety disorder, unspecified: Secondary | ICD-10-CM

## 2021-01-21 ENCOUNTER — Other Ambulatory Visit: Payer: Self-pay | Admitting: Family Medicine

## 2021-01-21 DIAGNOSIS — J40 Bronchitis, not specified as acute or chronic: Secondary | ICD-10-CM

## 2021-01-21 DIAGNOSIS — J4521 Mild intermittent asthma with (acute) exacerbation: Secondary | ICD-10-CM

## 2021-01-21 DIAGNOSIS — J329 Chronic sinusitis, unspecified: Secondary | ICD-10-CM

## 2021-01-24 ENCOUNTER — Other Ambulatory Visit: Payer: Self-pay | Admitting: Medical-Surgical

## 2021-01-25 DIAGNOSIS — M961 Postlaminectomy syndrome, not elsewhere classified: Secondary | ICD-10-CM | POA: Diagnosis not present

## 2021-01-25 DIAGNOSIS — Z79899 Other long term (current) drug therapy: Secondary | ICD-10-CM | POA: Diagnosis not present

## 2021-01-25 DIAGNOSIS — Z9109 Other allergy status, other than to drugs and biological substances: Secondary | ICD-10-CM | POA: Diagnosis not present

## 2021-01-25 DIAGNOSIS — M5010 Cervical disc disorder with radiculopathy, unspecified cervical region: Secondary | ICD-10-CM | POA: Diagnosis not present

## 2021-02-02 DIAGNOSIS — J3089 Other allergic rhinitis: Secondary | ICD-10-CM | POA: Diagnosis not present

## 2021-02-09 ENCOUNTER — Telehealth: Payer: Self-pay | Admitting: Neurology

## 2021-02-09 NOTE — Telephone Encounter (Signed)
Patient called to get a refill of Seroquel. This was sent in for a 3 month supply at the end of September. [phone].

## 2021-02-09 NOTE — Telephone Encounter (Signed)
Called left vm that patient should have enough medication to contact pharmacy.

## 2021-02-16 ENCOUNTER — Other Ambulatory Visit: Payer: Self-pay | Admitting: *Deleted

## 2021-02-16 ENCOUNTER — Other Ambulatory Visit: Payer: Self-pay | Admitting: Physician Assistant

## 2021-02-17 ENCOUNTER — Telehealth: Payer: Medicare Other | Admitting: Physician Assistant

## 2021-02-23 ENCOUNTER — Telehealth (INDEPENDENT_AMBULATORY_CARE_PROVIDER_SITE_OTHER): Payer: Medicare Other | Admitting: Sports Medicine

## 2021-02-23 DIAGNOSIS — J4521 Mild intermittent asthma with (acute) exacerbation: Secondary | ICD-10-CM

## 2021-02-23 MED ORDER — PREDNISONE 50 MG PO TABS: ORAL_TABLET | ORAL | 0 refills | Status: DC

## 2021-02-23 MED ORDER — HYDROCOD POLST-CPM POLST ER 10-8 MG/5ML PO SUER
5.0000 mL | Freq: Two times a day (BID) | ORAL | 0 refills | Status: DC | PRN
Start: 2021-02-23 — End: 2021-04-09

## 2021-02-23 MED ORDER — AZITHROMYCIN 250 MG PO TABS: ORAL_TABLET | ORAL | 0 refills | Status: DC

## 2021-02-23 NOTE — Progress Notes (Signed)
Patient has had cough, sore throat, runny nose, and congestion x 4 days. She did take a covid test and it was negative. Her son was sick previously and also had a negative covid test.   She prefers a phone call vs video visit

## 2021-02-23 NOTE — Assessment & Plan Note (Signed)
This is a pleasant 45 year old female, she does have a history of asthma, for the past 4 days she has had increasing sore throat, cough, runny nose, muscle aches and body aches, COVID testing was negative at home. Due to her underlying history of asthma we will treat aggressively with prednisone, azithromycin, Tussionex. She will drop by for chest x-ray. Return to see [place] if no better in a week or 2.

## 2021-02-23 NOTE — Progress Notes (Signed)
   Virtual Visit via Telephone   I connected with  [name]  on 02/23/21 by telephone/telehealth and verified that I am speaking with the correct person using two identifiers.   I discussed the limitations, risks, security and privacy concerns of performing an evaluation and management service by telephone, including the higher likelihood of inaccurate diagnosis and treatment, and the availability of in person appointments.  We also discussed the likely need of an additional face to face encounter for complete and high quality delivery of care.  I also discussed with the patient that there may be a patient responsible charge related to this service. The patient expressed understanding and wishes to proceed.  Provider location is in medical facility. Patient location is at their home, different from provider location. People involved in care of the patient during this telehealth encounter were myself, my nurse/medical assistant, and my front office/scheduling team member.  Review of Systems: No fevers, chills, night sweats, weight loss, chest pain, or shortness of breath.   Objective Findings:    General: Speaking full sentences, no audible heavy breathing.  Sounds alert and appropriately interactive.    Independent interpretation of tests performed by another provider:   None.  Brief History, Exam, Impression, and Recommendations:    Asthma This is a pleasant 45 year old female, she does have a history of asthma, for the past 4 days she has had increasing sore throat, cough, runny nose, muscle aches and body aches, COVID testing was negative at home. Due to her underlying history of asthma we will treat aggressively with prednisone, azithromycin, Tussionex. She will drop by for chest x-ray. Return to see [place] if no better in a week or 2.   I discussed the above assessment and treatment plan with the patient. The patient was provided an opportunity to ask questions and all  were answered. The patient agreed with the plan and demonstrated an understanding of the instructions.   The patient was advised to call back or seek an in-person evaluation if the symptoms worsen or if the condition fails to improve as anticipated.   I provided 30 minutes of verbal and non-verbal time during this encounter date, time was needed to gather information, review chart, records, communicate/coordinate with staff remotely, as well as complete documentation.   ___________________________________________ Gwen Her. Dianah Field, M.D., ABFM., CAQSM. Primary Care and Sports Medicine [facility]  Adjunct Professor of [employer]

## 2021-02-24 ENCOUNTER — Other Ambulatory Visit: Payer: Self-pay | Admitting: Physician Assistant

## 2021-02-24 DIAGNOSIS — M961 Postlaminectomy syndrome, not elsewhere classified: Secondary | ICD-10-CM | POA: Diagnosis not present

## 2021-02-24 DIAGNOSIS — Z79899 Other long term (current) drug therapy: Secondary | ICD-10-CM | POA: Diagnosis not present

## 2021-02-24 DIAGNOSIS — R059 Cough, unspecified: Secondary | ICD-10-CM | POA: Diagnosis not present

## 2021-02-24 DIAGNOSIS — Z1159 Encounter for screening for other viral diseases: Secondary | ICD-10-CM | POA: Diagnosis not present

## 2021-02-24 DIAGNOSIS — E7800 Pure hypercholesterolemia, unspecified: Secondary | ICD-10-CM | POA: Diagnosis not present

## 2021-02-24 DIAGNOSIS — E559 Vitamin D deficiency, unspecified: Secondary | ICD-10-CM | POA: Diagnosis not present

## 2021-02-24 DIAGNOSIS — M5010 Cervical disc disorder with radiculopathy, unspecified cervical region: Secondary | ICD-10-CM | POA: Diagnosis not present

## 2021-02-25 DIAGNOSIS — M25561 Pain in right knee: Secondary | ICD-10-CM | POA: Diagnosis not present

## 2021-02-25 DIAGNOSIS — M7502 Adhesive capsulitis of left shoulder: Secondary | ICD-10-CM | POA: Diagnosis not present

## 2021-03-01 DIAGNOSIS — M5030 Other cervical disc degeneration, unspecified cervical region: Secondary | ICD-10-CM | POA: Diagnosis not present

## 2021-03-01 DIAGNOSIS — M47816 Spondylosis without myelopathy or radiculopathy, lumbar region: Secondary | ICD-10-CM | POA: Diagnosis not present

## 2021-03-01 DIAGNOSIS — Z79899 Other long term (current) drug therapy: Secondary | ICD-10-CM | POA: Diagnosis not present

## 2021-03-13 DIAGNOSIS — M25561 Pain in right knee: Secondary | ICD-10-CM | POA: Diagnosis not present

## 2021-03-23 ENCOUNTER — Other Ambulatory Visit: Payer: Self-pay | Admitting: Physician Assistant

## 2021-03-23 DIAGNOSIS — F5101 Primary insomnia: Secondary | ICD-10-CM

## 2021-03-24 ENCOUNTER — Other Ambulatory Visit: Payer: Self-pay | Admitting: Physician Assistant

## 2021-03-24 ENCOUNTER — Other Ambulatory Visit: Payer: Self-pay | Admitting: *Deleted

## 2021-03-24 ENCOUNTER — Telehealth: Payer: Self-pay | Admitting: *Deleted

## 2021-03-24 MED ORDER — ZOLPIDEM TARTRATE ER 12.5 MG PO TBCR: 12.5000 mg | EXTENDED_RELEASE_TABLET | Freq: Every evening | ORAL | 1 refills | Status: DC | PRN

## 2021-03-24 NOTE — Telephone Encounter (Signed)
Pt called wanting a refill on Ambien. She stated that she has not been taking the Seroquel because it didn't work.  She had leftover Ambien at home so she's been taking that instead but took her last pill last night.  I advised her that I talked to you and that you were going to look into it. Pt is aware that if you do send it in that she won't be able to get it tonight.

## 2021-03-30 DIAGNOSIS — M961 Postlaminectomy syndrome, not elsewhere classified: Secondary | ICD-10-CM | POA: Diagnosis not present

## 2021-03-30 DIAGNOSIS — E7800 Pure hypercholesterolemia, unspecified: Secondary | ICD-10-CM | POA: Diagnosis not present

## 2021-03-30 DIAGNOSIS — E559 Vitamin D deficiency, unspecified: Secondary | ICD-10-CM | POA: Diagnosis not present

## 2021-03-30 DIAGNOSIS — M5010 Cervical disc disorder with radiculopathy, unspecified cervical region: Secondary | ICD-10-CM | POA: Diagnosis not present

## 2021-03-30 DIAGNOSIS — Z79899 Other long term (current) drug therapy: Secondary | ICD-10-CM | POA: Diagnosis not present

## 2021-03-31 DIAGNOSIS — G8918 Other acute postprocedural pain: Secondary | ICD-10-CM | POA: Diagnosis not present

## 2021-03-31 DIAGNOSIS — Y999 Unspecified external cause status: Secondary | ICD-10-CM | POA: Diagnosis not present

## 2021-03-31 DIAGNOSIS — M65861 Other synovitis and tenosynovitis, right lower leg: Secondary | ICD-10-CM | POA: Diagnosis not present

## 2021-03-31 DIAGNOSIS — X58XXXA Exposure to other specified factors, initial encounter: Secondary | ICD-10-CM | POA: Diagnosis not present

## 2021-03-31 DIAGNOSIS — M794 Hypertrophy of (infrapatellar) fat pad: Secondary | ICD-10-CM | POA: Diagnosis not present

## 2021-03-31 DIAGNOSIS — M94261 Chondromalacia, right knee: Secondary | ICD-10-CM | POA: Diagnosis not present

## 2021-03-31 DIAGNOSIS — J3089 Other allergic rhinitis: Secondary | ICD-10-CM | POA: Diagnosis not present

## 2021-03-31 DIAGNOSIS — M659 Synovitis and tenosynovitis, unspecified: Secondary | ICD-10-CM | POA: Diagnosis not present

## 2021-03-31 DIAGNOSIS — S83221A Peripheral tear of medial meniscus, current injury, right knee, initial encounter: Secondary | ICD-10-CM | POA: Diagnosis not present

## 2021-03-31 DIAGNOSIS — S83241A Other tear of medial meniscus, current injury, right knee, initial encounter: Secondary | ICD-10-CM | POA: Diagnosis not present

## 2021-04-09 ENCOUNTER — Telehealth: Payer: Medicare Other | Admitting: Physician Assistant

## 2021-04-09 ENCOUNTER — Telehealth: Payer: Self-pay | Admitting: Neurology

## 2021-04-09 DIAGNOSIS — R6889 Other general symptoms and signs: Secondary | ICD-10-CM | POA: Diagnosis not present

## 2021-04-09 MED ORDER — OSELTAMIVIR PHOSPHATE 75 MG PO CAPS: 75.0000 mg | ORAL_CAPSULE | Freq: Two times a day (BID) | ORAL | 0 refills | Status: DC

## 2021-04-09 MED ORDER — BENZONATATE 100 MG PO CAPS
100.0000 mg | ORAL_CAPSULE | Freq: Three times a day (TID) | ORAL | 0 refills | Status: DC | PRN
Start: 2021-04-09 — End: 2021-04-20

## 2021-04-09 NOTE — Progress Notes (Signed)
Virtual Visit Consent   [name], you are scheduled for a virtual visit with a [facility] provider today.     Just as with appointments in the office, your consent must be obtained to participate.  Your consent will be active for this visit and any virtual visit you may have with one of our providers in the next 365 days.     If you have a MyChart account, a copy of this consent can be sent to you electronically.  All virtual visits are billed to your insurance company just like a traditional visit in the office.    As this is a virtual visit, video technology does not allow for your provider to perform a traditional examination.  This may limit your provider's ability to fully assess your condition.  If your provider identifies any concerns that need to be evaluated in person or the need to arrange testing (such as labs, EKG, etc.), we will make arrangements to do so.     Although advances in technology are sophisticated, we cannot ensure that it will always work on either your end or our end.  If the connection with a video visit is poor, the visit may have to be switched to a telephone visit.  With either a video or telephone visit, we are not always able to ensure that we have a secure connection.     I need to obtain your verbal consent now.   Are you willing to proceed with your visit today?    [name] has provided verbal consent on 04/09/2021 for a virtual visit (video or telephone).   Leeanne Rio, [place]   Date: 04/09/2021 1:58 PM   Virtual Visit via Video Note   I, Leeanne Rio, connected with  [name]  ([id number], August 04, 1975) on 04/09/21 at  2:00 PM EST by a video-enabled telemedicine application and verified that I am speaking with the correct person using two identifiers.  Location: Patient: Virtual Visit Location Patient: Home Provider: Virtual Visit Location Provider: Home Office   I discussed the limitations of  evaluation and management by telemedicine and the availability of in person appointments. The patient expressed understanding and agreed to proceed.    History of Present Illness: [name] is a 45 y.o. who identifies as a female who was assigned female at birth, and is being seen today for URI symptoms starting yesterday. Notes some fatigue, sore throat, fever (101), chills, aches and a persistent dry cough. Some chest tightness for which she has been using her inhaler which helps. Recently had neck surgery and the cough is making her neck hurt worse. Has been taking OTC cold/sinus medications without much improvement in symptoms. Denies recent travel.  HPI: HPI  Problems:  Patient Active Problem List   Diagnosis Date Noted   Stress at home 12/29/2020   Injury of right knee 11/14/2020   Acute pain of right knee 11/14/2020   Numbness of right great toe 06/23/2020   Intractable right heel pain 05/04/2020   COVID-19 virus infection 01/14/2020   Impingement syndrome, shoulder, left 12/30/2019   Pes anserine bursitis 09/03/2019   Nodule right fifth PIP of hand 09/03/2019   Mood changes 08/25/2019   RLS (restless legs syndrome) 07/08/2019   Leukocytes in urine 03/04/2019   Other microscopic hematuria 03/04/2019   Left ovarian cyst 03/04/2019   Dog bite 02/15/2019   Mild episode of recurrent major depressive disorder ([place]) 10/30/2018   Traumatic ecchymosis of  left foot 10/15/2018   Asthma 01/29/2018   Chronic neck pain 01/29/2018   Hypertension 10/06/2017   Hx of cold sores 10/06/2017   First degree ankle sprain, left, initial encounter 08/29/2017   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   Chronic pain syndrome 01/08/2017   Trigger thumb, left thumb 10/06/2016   Primary osteoarthritis of right knee 09/05/2016   Balance problems 07/19/2016   Dizziness 07/19/2016   Palpitations 04/14/2016   Primary osteoarthritis of left first metacarpophalangeal joint 04/04/2016   Fixed pupils  08/31/2015   Dysuria 08/31/2015   Depression 08/31/2015   Vasomotor flushing 06/15/2015   GAD (generalized anxiety disorder) 06/15/2015   Therapeutic opioid induced constipation 12/16/2014   S/P cervical spinal fusion 10/28/2014   Tachycardia 08/13/2014   Endometriosis 07/20/2014   Paresthesia 06/03/2014   Myofascial pain syndrome 05/29/2014   Carpal tunnel syndrome of left wrist 05/05/2014   Left shoulder pain 04/02/2014   Constipation due to opioid therapy 01/17/2014   Weight gain 11/29/2013   Greater trochanteric bursitis 11/29/2013   PTSD (post-traumatic stress disorder) 12/14/2012   Tick-borne disease 08/09/2010   TROCHANTERIC BURSITIS 06/21/2010   Smoker 02/05/2010   INTERSTITIAL CYSTITIS 02/05/2010   FATIGUE 07/02/2009   Depressed 06/26/2008   Anxiety state 07/06/2007   Insomnia 07/06/2007   HEADACHE 07/06/2007    Allergies:  Allergies  Allergen Reactions   Gadolinium Derivatives Hives, Itching and Other (See Comments)    After MRI with [facility] finished, patient had redness and itching on chest, tightness in throat.  Patient went to ED to be monitored (04/23/14).had mri since takes benadryl before, tolerates well   Iodinated Diagnostic Agents Rash and Itching   Prednisone Other (See Comments)    Pain all over, "it triggers my myofascial pain syndrome"    Ioversol     Other reaction(s): Hives   Amitriptyline Other (See Comments)    Gained weight and does not want to be on.    Eggs Or Egg-Derived Products Rash   Hydrocodone-Acetaminophen Itching    Tolerates with benadryl (no reaction to oxycodone)   Lunesta [Eszopiclone] Other (See Comments)    Metallic taste in mouth   Tramadol Itching    Other reaction(s): Unknown   Medications:  Current Outpatient Medications:    benzonatate (TESSALON) 100 MG capsule, Take 1-2 capsules (100-200 mg total) by mouth 3 (three) times daily as needed for cough., Disp: 30 capsule, Rfl: 0   oseltamivir (TAMIFLU) 75 MG capsule,  Take 1 capsule (75 mg total) by mouth 2 (two) times daily., Disp: 10 capsule, Rfl: 0   albuterol (PROVENTIL) (2.5 MG/3ML) 0.083% nebulizer solution, Take 3 mLs (2.5 mg total) by nebulization every 4 (four) hours as needed for wheezing or shortness of breath (please include nebulizer machine, hoses, and mask if needed.)., Disp: 3 mL, Rfl: 2   albuterol (VENTOLIN HFA) 108 (90 Base) MCG/ACT inhaler, TAKE 2 PUFFS BY MOUTH EVERY 6 HOURS AS NEEDED, Disp: 8.5 each, Rfl: 1   hydrochlorothiazide (HYDRODIURIL) 12.5 MG tablet, Take 1 tablet (12.5 mg total) by mouth daily as needed., Disp: 90 tablet, Rfl: 1   hydrOXYzine (ATARAX/VISTARIL) 50 MG tablet, TAKE 1 TABLET (50 MG TOTAL) BY MOUTH 3 (THREE) TIMES DAILY AS NEEDED., Disp: 270 tablet, Rfl: 0   ipratropium (ATROVENT) 0.03 % nasal spray, PLACE 2 SPRAYS INTO BOTH NOSTRILS EVERY 12 (TWELVE) HOURS., Disp: 90 mL, Rfl: 1   methocarbamol (ROBAXIN) 500 MG tablet, Take 1 tablet by mouth 2 (two) times daily as needed., Disp: , Rfl:  metoprolol succinate (TOPROL-XL) 100 MG 24 hr tablet, Take 1 tablet (100 mg total) by mouth daily., Disp: 90 tablet, Rfl: 1   oxyCODONE (OXY IR/ROXICODONE) 5 MG immediate release tablet, Take 1 tablet by mouth every 12 (twelve) hours., Disp: , Rfl:    pantoprazole (PROTONIX) 40 MG tablet, TAKE 1 TABLET BY MOUTH EVERY DAY, Disp: 90 tablet, Rfl: 1   pregabalin (LYRICA) 200 MG capsule, Take 100 mg by mouth 3 (three) times daily. 100 mg tid, Disp: , Rfl:    promethazine (PHENERGAN) 25 MG tablet, TAKE 1 TABLET (25 MG TOTAL) BY MOUTH EVERY 8 (EIGHT) HOURS AS NEEDED FOR NAUSEA OR VOMITING., Disp: 30 tablet, Rfl: 5   QUEtiapine (SEROQUEL) 25 MG tablet, TAKE 1-2 TABLETS 30MINUTES TO 1 HOUR BEFORE BED FOR MOOD AND SLEEP., Disp: 180 tablet, Rfl: 0   rizatriptan (MAXALT) 10 MG tablet, TAKE 1 TABLET AS NEEDED FOR MIGRAINE, MAY REPEAT 2 HRS AS NEEDED, Disp: 10 tablet, Rfl: 5   rOPINIRole (REQUIP) 0.25 MG tablet, Take 1-3 tablets (0.25-0.75 mg total) by  mouth at bedtime., Disp: 270 tablet, Rfl: 1   triamcinolone (KENALOG) 0.025 % cream, APPLY TO AFFECTED AREA TWICE A DAY AS NEEDED, Disp: , Rfl:    zolpidem (AMBIEN CR) 12.5 MG CR tablet, Take 1 tablet (12.5 mg total) by mouth at bedtime as needed for sleep., Disp: 30 tablet, Rfl: 1  Observations/Objective: Patient is well-developed, well-nourished in no acute distress.  Resting comfortably in bed at home.  Head is normocephalic, atraumatic.  No labored breathing. Speech is clear and coherent with logical content.  Patient is alert and oriented at baseline.  Assessment and Plan: 1. Flu-like symptoms - benzonatate (TESSALON) 100 MG capsule; Take 1-2 capsules (100-200 mg total) by mouth 3 (three) times daily as needed for cough.  Dispense: 30 capsule; Refill: 0 - oseltamivir (TAMIFLU) 75 MG capsule; Take 1 capsule (75 mg total) by mouth 2 (two) times daily.  Dispense: 10 capsule; Refill: 0 Rx Tamiflu giving her risks. Supportive measures, OTC medications and quarantine reviewed. Tessalon per orders. Strict in-person precautions reviewed.   Follow Up Instructions: I discussed the assessment and treatment plan with the patient. The patient was provided an opportunity to ask questions and all were answered. The patient agreed with the plan and demonstrated an understanding of the instructions.  A copy of instructions were sent to the patient via MyChart unless otherwise noted below.   The patient was advised to call back or seek an in-person evaluation if the symptoms worsen or if the condition fails to improve as anticipated.  Time:  I spent 12 minutes with the patient via telehealth technology discussing the above problems/concerns.    Leeanne Rio, PA-C

## 2021-04-09 NOTE — Telephone Encounter (Signed)
Spoke with patient and advised her to schedule via MyChart.

## 2021-04-09 NOTE — Patient Instructions (Signed)
[name], thank you for joining Leeanne Rio, PA-C for today's virtual visit.  While this provider is not your primary care provider (PCP), if your PCP is located in our provider database this encounter information will be shared with them immediately following your visit.  Consent: (Patient) [name] provided verbal consent for this virtual visit at the beginning of the encounter.  Current Medications:  Current Outpatient Medications:    albuterol (PROVENTIL) (2.5 MG/3ML) 0.083% nebulizer solution, Take 3 mLs (2.5 mg total) by nebulization every 4 (four) hours as needed for wheezing or shortness of breath (please include nebulizer machine, hoses, and mask if needed.)., Disp: 3 mL, Rfl: 2   albuterol (VENTOLIN HFA) 108 (90 Base) MCG/ACT inhaler, TAKE 2 PUFFS BY MOUTH EVERY 6 HOURS AS NEEDED, Disp: 8.5 each, Rfl: 1   azithromycin (ZITHROMAX Z-PAK) 250 MG tablet, Take 2 tablets (500 mg) on  Day 1,  followed by 1 tablet (250 mg) once daily on Days 2 through 5., Disp: 6 tablet, Rfl: 0   chlorpheniramine-HYDROcodone (TUSSIONEX) 10-8 MG/5ML SUER, Take 5 mLs by mouth every 12 (twelve) hours as needed for cough (cough, will cause drowsiness.)., Disp: 120 mL, Rfl: 0   hydrochlorothiazide (HYDRODIURIL) 12.5 MG tablet, Take 1 tablet (12.5 mg total) by mouth daily as needed., Disp: 90 tablet, Rfl: 1   hydrOXYzine (ATARAX/VISTARIL) 50 MG tablet, TAKE 1 TABLET (50 MG TOTAL) BY MOUTH 3 (THREE) TIMES DAILY AS NEEDED., Disp: 270 tablet, Rfl: 0   ipratropium (ATROVENT) 0.03 % nasal spray, PLACE 2 SPRAYS INTO BOTH NOSTRILS EVERY 12 (TWELVE) HOURS., Disp: 90 mL, Rfl: 1   methocarbamol (ROBAXIN) 500 MG tablet, Take 1 tablet by mouth 2 (two) times daily as needed., Disp: , Rfl:    metoprolol succinate (TOPROL-XL) 100 MG 24 hr tablet, Take 1 tablet (100 mg total) by mouth daily., Disp: 90 tablet, Rfl: 1   ondansetron (ZOFRAN-ODT) 8 MG disintegrating tablet, Take 1 tablet (8 mg  total) by mouth every 8 (eight) hours as needed for nausea., Disp: 20 tablet, Rfl: 3   oxyCODONE (OXY IR/ROXICODONE) 5 MG immediate release tablet, Take 1 tablet by mouth every 12 (twelve) hours., Disp: , Rfl:    pantoprazole (PROTONIX) 40 MG tablet, TAKE 1 TABLET BY MOUTH EVERY DAY, Disp: 90 tablet, Rfl: 1   pregabalin (LYRICA) 200 MG capsule, Take 100 mg by mouth 3 (three) times daily. 100 mg tid, Disp: , Rfl:    promethazine (PHENERGAN) 25 MG tablet, TAKE 1 TABLET (25 MG TOTAL) BY MOUTH EVERY 8 (EIGHT) HOURS AS NEEDED FOR NAUSEA OR VOMITING., Disp: 30 tablet, Rfl: 5   QUEtiapine (SEROQUEL) 25 MG tablet, TAKE 1-2 TABLETS 30MINUTES TO 1 HOUR BEFORE BED FOR MOOD AND SLEEP., Disp: 180 tablet, Rfl: 0   rizatriptan (MAXALT) 10 MG tablet, TAKE 1 TABLET AS NEEDED FOR MIGRAINE, MAY REPEAT 2 HRS AS NEEDED, Disp: 10 tablet, Rfl: 5   rOPINIRole (REQUIP) 0.25 MG tablet, Take 1-3 tablets (0.25-0.75 mg total) by mouth at bedtime., Disp: 270 tablet, Rfl: 1   triamcinolone (KENALOG) 0.025 % cream, APPLY TO AFFECTED AREA TWICE A DAY AS NEEDED, Disp: , Rfl:    zolpidem (AMBIEN CR) 12.5 MG CR tablet, Take 1 tablet (12.5 mg total) by mouth at bedtime as needed for sleep., Disp: 30 tablet, Rfl: 1   Medications ordered in this encounter:  No orders of the defined types were placed in this encounter.    *If you need refills on other medications prior  to your next appointment, please contact your pharmacy*  Follow-Up: Call back or seek an in-person evaluation if the symptoms worsen or if the condition fails to improve as anticipated.  Other Instructions Please keep well-hydrated and get plenty of rest.  Alternate tylenol and ibuprofen if needed for headache, aches or fever. If you have a humidifier, place in the bedroom and run at night.  Take the Tamiflu as directed. You can use the Tessalon as directed for cough along with your albuterol as needed.  Remain home until fever-free for 24 hours without medication  and feeling better.    If you have been instructed to have an in-person evaluation today at a local Urgent Care facility, please use the link below. It will take you to a list of all of our available [facility], including address, phone number and hours of operation. Please do not delay care.  [facility]  If you or a family member do not have a primary care provider, use the link below to schedule a visit and establish care. When you choose a [facility] primary care physician or advanced practice provider, you gain a long-term partner in health. Find a Primary Care Provider  Learn more about [facility]'s in-office and virtual care options: [facility]

## 2021-04-09 NOTE — Telephone Encounter (Signed)
Patient left vm stating she was trying to get an appt today and none available. Wants RX for cough medication. Can not give RX without appt. Patient had knee surgery and can not go to urgent care for evaluation.   Can we let patient know she can have a virtual visit through her mychart with [facility], not our office, if sick and needs to discuss symptoms, get medication, etc. Thanks!

## 2021-04-13 DIAGNOSIS — J168 Pneumonia due to other specified infectious organisms: Secondary | ICD-10-CM | POA: Diagnosis not present

## 2021-04-13 DIAGNOSIS — R059 Cough, unspecified: Secondary | ICD-10-CM | POA: Diagnosis not present

## 2021-04-13 DIAGNOSIS — Z7952 Long term (current) use of systemic steroids: Secondary | ICD-10-CM | POA: Diagnosis not present

## 2021-04-13 DIAGNOSIS — Z23 Encounter for immunization: Secondary | ICD-10-CM | POA: Diagnosis not present

## 2021-04-13 DIAGNOSIS — Z20822 Contact with and (suspected) exposure to covid-19: Secondary | ICD-10-CM | POA: Diagnosis not present

## 2021-04-13 DIAGNOSIS — Z791 Long term (current) use of non-steroidal anti-inflammatories (NSAID): Secondary | ICD-10-CM | POA: Diagnosis not present

## 2021-04-13 DIAGNOSIS — Z91012 Allergy to eggs: Secondary | ICD-10-CM | POA: Diagnosis not present

## 2021-04-13 DIAGNOSIS — J984 Other disorders of lung: Secondary | ICD-10-CM | POA: Diagnosis not present

## 2021-04-13 DIAGNOSIS — Z888 Allergy status to other drugs, medicaments and biological substances status: Secondary | ICD-10-CM | POA: Diagnosis not present

## 2021-04-13 DIAGNOSIS — F17210 Nicotine dependence, cigarettes, uncomplicated: Secondary | ICD-10-CM | POA: Diagnosis not present

## 2021-04-13 DIAGNOSIS — Z79891 Long term (current) use of opiate analgesic: Secondary | ICD-10-CM | POA: Diagnosis not present

## 2021-04-13 DIAGNOSIS — J189 Pneumonia, unspecified organism: Secondary | ICD-10-CM | POA: Diagnosis not present

## 2021-04-13 DIAGNOSIS — D72829 Elevated white blood cell count, unspecified: Secondary | ICD-10-CM | POA: Diagnosis not present

## 2021-04-13 DIAGNOSIS — Z7951 Long term (current) use of inhaled steroids: Secondary | ICD-10-CM | POA: Diagnosis not present

## 2021-04-13 DIAGNOSIS — Z79899 Other long term (current) drug therapy: Secondary | ICD-10-CM | POA: Diagnosis not present

## 2021-04-13 DIAGNOSIS — R0602 Shortness of breath: Secondary | ICD-10-CM | POA: Diagnosis not present

## 2021-04-13 DIAGNOSIS — Z885 Allergy status to narcotic agent status: Secondary | ICD-10-CM | POA: Diagnosis not present

## 2021-04-13 DIAGNOSIS — G4710 Hypersomnia, unspecified: Secondary | ICD-10-CM | POA: Diagnosis not present

## 2021-04-13 DIAGNOSIS — Z91041 Radiographic dye allergy status: Secondary | ICD-10-CM | POA: Diagnosis not present

## 2021-04-13 DIAGNOSIS — Z792 Long term (current) use of antibiotics: Secondary | ICD-10-CM | POA: Diagnosis not present

## 2021-04-14 DIAGNOSIS — D72829 Elevated white blood cell count, unspecified: Secondary | ICD-10-CM | POA: Diagnosis not present

## 2021-04-14 DIAGNOSIS — J189 Pneumonia, unspecified organism: Secondary | ICD-10-CM | POA: Diagnosis not present

## 2021-04-20 ENCOUNTER — Encounter: Payer: Self-pay | Admitting: Physician Assistant

## 2021-04-20 ENCOUNTER — Ambulatory Visit (INDEPENDENT_AMBULATORY_CARE_PROVIDER_SITE_OTHER): Payer: Medicare Other

## 2021-04-20 ENCOUNTER — Ambulatory Visit (INDEPENDENT_AMBULATORY_CARE_PROVIDER_SITE_OTHER): Payer: Medicare Other | Admitting: Physician Assistant

## 2021-04-20 ENCOUNTER — Other Ambulatory Visit: Payer: Self-pay

## 2021-04-20 VITALS — BP 115/50 | HR 89 | Ht 63.0 in | Wt 142.0 lb

## 2021-04-20 DIAGNOSIS — R4586 Emotional lability: Secondary | ICD-10-CM | POA: Diagnosis not present

## 2021-04-20 DIAGNOSIS — Z09 Encounter for follow-up examination after completed treatment for conditions other than malignant neoplasm: Secondary | ICD-10-CM

## 2021-04-20 DIAGNOSIS — J189 Pneumonia, unspecified organism: Secondary | ICD-10-CM

## 2021-04-20 DIAGNOSIS — F411 Generalized anxiety disorder: Secondary | ICD-10-CM

## 2021-04-20 DIAGNOSIS — Z87891 Personal history of nicotine dependence: Secondary | ICD-10-CM

## 2021-04-20 DIAGNOSIS — F330 Major depressive disorder, recurrent, mild: Secondary | ICD-10-CM

## 2021-04-20 DIAGNOSIS — M47816 Spondylosis without myelopathy or radiculopathy, lumbar region: Secondary | ICD-10-CM | POA: Diagnosis not present

## 2021-04-20 MED ORDER — QUETIAPINE FUMARATE 50 MG PO TABS: 50.0000 mg | ORAL_TABLET | Freq: Every day | ORAL | 1 refills | Status: DC

## 2021-04-20 NOTE — Progress Notes (Signed)
Pneumonia has cleared!

## 2021-04-20 NOTE — Progress Notes (Signed)
Subjective:    Patient ID: [name], female    DOB: 04/22/1976, 45 y.o.   MRN: [id number]  HPI Pt is a 45 yo female with history of asthma, anxiety presents to the clinic for hospital follow up on bilateral pneumonia. She went to ED on 12/13. Pulse ox was 94 percent, leukocytosis and CT with possible pneumonia. Procalcitonin low and no sign of sepsis. Pt was admitted for 24 hours and discharged the next day. She was given zpak, breathing treatments and ceftriaxone in the hospital and She was sent home with steroids and augmentin. She has finished steroids and has a few more days left of augmentin. She was negative for flu and covid. She is feeling much better. No fever, chills, cough.   She did stop smoking since last week. Her anxiety has been very bad. She is also not taking seroquel because she is on [place]. She felt ambien helps sleep more but seroquel was helping her mood.   .. Active Ambulatory Problems    Diagnosis Date Noted   Anxiety state 07/06/2007   Smoker 02/05/2010   Depressed 06/26/2008   INTERSTITIAL CYSTITIS 02/05/2010   Insomnia 07/06/2007   FATIGUE 07/02/2009   HEADACHE 07/06/2007   TROCHANTERIC BURSITIS 06/21/2010   Tick-borne disease 08/09/2010   PTSD (post-traumatic stress disorder) 12/14/2012   Weight gain 11/29/2013   Greater trochanteric bursitis 11/29/2013   Constipation due to opioid therapy 01/17/2014   Left shoulder pain 04/02/2014   Carpal tunnel syndrome of left wrist 05/05/2014   Myofascial pain syndrome 05/29/2014   Paresthesia 06/03/2014   Endometriosis 07/20/2014   Tachycardia 08/13/2014   S/P cervical spinal fusion 10/28/2014   Therapeutic opioid induced constipation 12/16/2014   Vasomotor flushing 06/15/2015   GAD (generalized anxiety disorder) 06/15/2015   Fixed pupils 08/31/2015   Dysuria 08/31/2015   Depression 08/31/2015   Primary osteoarthritis of left first metacarpophalangeal joint 04/04/2016   Palpitations  04/14/2016   Balance problems 07/19/2016   Dizziness 07/19/2016   Primary osteoarthritis of right knee 09/05/2016   Trigger thumb, left thumb 10/06/2016   Chronic pain syndrome 01/08/2017   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   First degree ankle sprain, left, initial encounter 08/29/2017   Hypertension 10/06/2017   Hx of cold sores 10/06/2017   Asthma 01/29/2018   Chronic neck pain 01/29/2018   Traumatic ecchymosis of left foot 10/15/2018   Mild episode of recurrent major depressive disorder ([place]) 10/30/2018   Dog bite 02/15/2019   Leukocytes in urine 03/04/2019   Other microscopic hematuria 03/04/2019   Left ovarian cyst 03/04/2019   RLS (restless legs syndrome) 07/08/2019   Mood changes 08/25/2019   Pes anserine bursitis 09/03/2019   Nodule right fifth PIP of hand 09/03/2019   Impingement syndrome, shoulder, left 12/30/2019   COVID-19 virus infection 01/14/2020   Intractable right heel pain 05/04/2020   Numbness of right great toe 06/23/2020   Injury of right knee 11/14/2020   Acute pain of right knee 11/14/2020   Stress at home 12/29/2020   Former cigarette smoker 04/20/2021   Pneumonia of both lower lobes due to infectious organism 04/20/2021   Resolved Ambulatory Problems    Diagnosis Date Noted   RHINITIS 10/13/2009   Polyuria 11/04/2010   Degenerative disc disease, cervical 10/31/2013   Cervical disc disorder with radiculopathy of cervical region 11/29/2013   Itching 11/29/2013   Radiculitis of left cervical region 01/17/2014   Nausea with vomiting 11/25/2014   Dysmenorrhea 05/11/2015   Cervical disc disorder with  radiculopathy of cervical region 11/11/2015   Acute upper respiratory infection 04/04/2016   Localized primary carpometacarpal osteoarthritis, left 04/04/2016   Cervical post-laminectomy syndrome 07/08/2019   Past Medical History:  Diagnosis Date   Acute medial meniscus tear of right knee    Anxiety    Arthritis    Complication of anesthesia    COVID-19  01/15/2020   Dysrhythmia    Elevated heart rate and blood pressure    Family history of adverse reaction to anesthesia    Fibromyalgia    GERD (gastroesophageal reflux disease)    IC (interstitial cystitis)    Myofascial pain syndrome, cervical    Neck pain    Ovarian cyst    PONV (postoperative nausea and vomiting)       Review of Systems See HPI.     Objective:   Physical Exam Vitals reviewed.  Constitutional:      Appearance: Normal appearance.  HENT:     Head: Normocephalic.  Cardiovascular:     Rate and Rhythm: Normal rate and regular rhythm.     Pulses: Normal pulses.     Heart sounds: Normal heart sounds.  Pulmonary:     Effort: Pulmonary effort is normal.     Comments: Left lower lung wheezing.  Musculoskeletal:     Right lower leg: No edema.     Left lower leg: No edema.  Neurological:     General: No focal deficit present.     Mental Status: She is alert and oriented to person, place, and time.  Psychiatric:        Mood and Affect: Mood normal.          Assessment & Plan:  .Marland KitchenKeela was seen today for hospitalization follow-up.  Diagnoses and all orders for this visit:  Hospital discharge follow-up -     DG Chest 2 View; Future  Pneumonia of both lower lobes due to infectious organism -     DG Chest 2 View; Future  Former cigarette smoker  Anxiety state -     QUEtiapine (SEROQUEL) 50 MG tablet; Take 1 tablet (50 mg total) by mouth at bedtime.  Mood changes -     QUEtiapine (SEROQUEL) 50 MG tablet; Take 1 tablet (50 mg total) by mouth at bedtime.  Mild episode of recurrent major depressive disorder (HCC) -     QUEtiapine (SEROQUEL) 50 MG tablet; Take 1 tablet (50 mg total) by mouth at bedtime.   I still heard some wheezing over left lower lungs.  Repeat CXR to make sure pneumonia has cleared.  Vitals look great. Pulse ox 100 percent.  Finish augmentin.   Congrats on smoking cessation.   Anxiety/insomnia- add back seroquel and cut  ambien in half.   Follow up in 6-8 weeks.

## 2021-04-24 ENCOUNTER — Other Ambulatory Visit: Payer: Self-pay | Admitting: Physician Assistant

## 2021-04-24 DIAGNOSIS — T887XXA Unspecified adverse effect of drug or medicament, initial encounter: Secondary | ICD-10-CM

## 2021-04-24 DIAGNOSIS — G2581 Restless legs syndrome: Secondary | ICD-10-CM

## 2021-04-25 DIAGNOSIS — Z91041 Radiographic dye allergy status: Secondary | ICD-10-CM | POA: Diagnosis not present

## 2021-04-25 DIAGNOSIS — J45909 Unspecified asthma, uncomplicated: Secondary | ICD-10-CM | POA: Diagnosis not present

## 2021-04-25 DIAGNOSIS — F419 Anxiety disorder, unspecified: Secondary | ICD-10-CM | POA: Diagnosis not present

## 2021-04-25 DIAGNOSIS — Z885 Allergy status to narcotic agent status: Secondary | ICD-10-CM | POA: Diagnosis not present

## 2021-04-25 DIAGNOSIS — S61212A Laceration without foreign body of right middle finger without damage to nail, initial encounter: Secondary | ICD-10-CM | POA: Diagnosis not present

## 2021-04-25 DIAGNOSIS — Z888 Allergy status to other drugs, medicaments and biological substances status: Secondary | ICD-10-CM | POA: Diagnosis not present

## 2021-04-25 DIAGNOSIS — S6991XA Unspecified injury of right wrist, hand and finger(s), initial encounter: Secondary | ICD-10-CM | POA: Diagnosis not present

## 2021-04-25 DIAGNOSIS — G8911 Acute pain due to trauma: Secondary | ICD-10-CM | POA: Diagnosis not present

## 2021-04-25 DIAGNOSIS — Z91012 Allergy to eggs: Secondary | ICD-10-CM | POA: Diagnosis not present

## 2021-04-25 DIAGNOSIS — F17210 Nicotine dependence, cigarettes, uncomplicated: Secondary | ICD-10-CM | POA: Diagnosis not present

## 2021-04-26 ENCOUNTER — Other Ambulatory Visit: Payer: Self-pay | Admitting: Physician Assistant

## 2021-04-26 DIAGNOSIS — J4521 Mild intermittent asthma with (acute) exacerbation: Secondary | ICD-10-CM

## 2021-04-26 DIAGNOSIS — J40 Bronchitis, not specified as acute or chronic: Secondary | ICD-10-CM

## 2021-05-06 ENCOUNTER — Encounter: Payer: Self-pay | Admitting: Family Medicine

## 2021-05-06 ENCOUNTER — Other Ambulatory Visit: Payer: Self-pay

## 2021-05-06 ENCOUNTER — Telehealth (INDEPENDENT_AMBULATORY_CARE_PROVIDER_SITE_OTHER): Payer: Medicare Other | Admitting: Family Medicine

## 2021-05-06 VITALS — Temp 98.7°F | Ht 63.0 in | Wt 138.0 lb

## 2021-05-06 DIAGNOSIS — J22 Unspecified acute lower respiratory infection: Secondary | ICD-10-CM | POA: Diagnosis not present

## 2021-05-06 DIAGNOSIS — M47816 Spondylosis without myelopathy or radiculopathy, lumbar region: Secondary | ICD-10-CM | POA: Diagnosis not present

## 2021-05-06 MED ORDER — AZITHROMYCIN 250 MG PO TABS: ORAL_TABLET | ORAL | 0 refills | Status: AC

## 2021-05-06 NOTE — Progress Notes (Signed)
. ° ° °  Virtual Visit via Video Note  I connected with [name] on 05/06/21 at  4:20 PM EST by a video enabled telemedicine application and verified that I am speaking with the correct person using two identifiers.   I discussed the limitations of evaluation and management by telemedicine and the availability of in person appointments. The patient expressed understanding and agreed to proceed.  Patient location: at home Provider location: in office  Subjective:    CC:   Chief Complaint  Patient presents with   Cough    Productive cough, Headache, runny nose, nausea for 4 days     HPI: Starting about 5 days ago having HA, runny nose, coughing, nausea + ST. Just got over bilaeral PNA about 4 weeks ago.  Coughing up green phlegm now so worried that she may get pneumonia again.. Having chills os sweats.  Subjective fever.  No ear pain.  No Gi sxs.  Hasn't had to use her nebulizer, but has used her inhaler couple of times.   Past medical history, Surgical history, Family history not pertinant except as noted below, Social history, Allergies, and medications have been entered into the medical record, reviewed, and corrections made.    Objective:    General: Speaking clearly in complete sentences without any shortness of breath.  Alert and oriented x3.  Normal judgment. No apparent acute distress.    Impression and Recommendations:    Problem List Items Addressed This Visit   None Visit Diagnoses     Lower respiratory tract infection    -  Primary   Relevant Medications   azithromycin (ZITHROMAX) 250 MG tablet      Lower respiratory tract infection-we will go ahead and treat with azithromycin this still could be viral as she is only on 05 but I do understand her concern that she just got over bilateral pneumonia.  I think it is reasonable to treat with azithromycin but if she is not feeling better after the weekend then I want her to come in in person to be evaluated  and have her chest evaluated.  Urged her to be liberal with her inhalers and nebulizers if needed.  No orders of the defined types were placed in this encounter.   Meds ordered this encounter  Medications   azithromycin (ZITHROMAX) 250 MG tablet    Sig: 2 Ttabs PO on Day 1, then one a day x 4 days.    Dispense:  6 tablet    Refill:  0     I discussed the assessment and treatment plan with the patient. The patient was provided an opportunity to ask questions and all were answered. The patient agreed with the plan and demonstrated an understanding of the instructions.   The patient was advised to call back or seek an in-person evaluation if the symptoms worsen or if the condition fails to improve as anticipated.   Beatrice Lecher, MD

## 2021-05-07 ENCOUNTER — Other Ambulatory Visit: Payer: Self-pay | Admitting: Neurology

## 2021-05-07 MED ORDER — ZOLPIDEM TARTRATE 10 MG PO TABS: 10.0000 mg | ORAL_TABLET | Freq: Every evening | ORAL | 0 refills | Status: DC | PRN

## 2021-05-07 NOTE — Telephone Encounter (Signed)
Patient is asking for a refill of Ambien 10 mg #30, please advise.  Previous RX 09/07/2020 #30 with 5 refills, Ambien 12.5 mg sent in November, she states it was discussed her decreasing dosage

## 2021-05-18 ENCOUNTER — Other Ambulatory Visit: Payer: Self-pay

## 2021-05-18 ENCOUNTER — Ambulatory Visit (INDEPENDENT_AMBULATORY_CARE_PROVIDER_SITE_OTHER): Payer: Medicare Other | Admitting: Sports Medicine

## 2021-05-18 ENCOUNTER — Ambulatory Visit (INDEPENDENT_AMBULATORY_CARE_PROVIDER_SITE_OTHER): Payer: Medicare Other

## 2021-05-18 DIAGNOSIS — M7989 Other specified soft tissue disorders: Secondary | ICD-10-CM | POA: Diagnosis not present

## 2021-05-18 DIAGNOSIS — S60222A Contusion of left hand, initial encounter: Secondary | ICD-10-CM

## 2021-05-18 DIAGNOSIS — M79604 Pain in right leg: Secondary | ICD-10-CM

## 2021-05-18 DIAGNOSIS — M79642 Pain in left hand: Secondary | ICD-10-CM | POA: Diagnosis not present

## 2021-05-18 DIAGNOSIS — R600 Localized edema: Secondary | ICD-10-CM | POA: Diagnosis not present

## 2021-05-18 DIAGNOSIS — M7121 Synovial cyst of popliteal space [Baker], right knee: Secondary | ICD-10-CM | POA: Diagnosis not present

## 2021-05-18 NOTE — Assessment & Plan Note (Signed)
[name] is just a few weeks post arthroscopic surgery of her right knee, she has been having increasing pain and swelling behind her knee with radiation down the back of the leg, I do suspect a Baker's cyst, and typical postoperative findings however it would be a bad idea to not at least do a DVT ultrasound. She does have an appointment coming up with her surgeon.

## 2021-05-18 NOTE — Assessment & Plan Note (Signed)
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 On exam she has minimal swelling and bruising, tenderness directly over the FDI, not so much over the first extensor compartment, CMC, she does not have a gamekeeper's thumb. Adding a thumb spica brace, x-rays, return to see me as needed if not better in 2 to 3 weeks.

## 2021-05-18 NOTE — Progress Notes (Signed)
° ° °  Procedures performed today:    None.  Independent interpretation of notes and tests performed by another provider:   None.  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 On exam she has minimal swelling and bruising, tenderness directly over the FDI, not so much over the first extensor compartment, CMC, she does not have a gamekeeper's thumb. Adding a thumb spica brace, x-rays, return to see me as needed if not better in 2 to 3 weeks.  Right leg swelling Jakalyn is just a few weeks post arthroscopic surgery of her right knee, she has been having increasing pain and swelling behind her knee with radiation down the back of the leg, I do suspect a Baker's cyst, and typical postoperative findings however it would be a bad idea to not at least do a DVT ultrasound. She does have an appointment coming up with her surgeon.   ___________________________________________ Gwen Her. Dianah Field, M.D., ABFM., CAQSM. Primary Care and [place] Instructor of [place] of [facility] of Medicine

## 2021-05-20 DIAGNOSIS — Z4789 Encounter for other orthopedic aftercare: Secondary | ICD-10-CM | POA: Diagnosis not present

## 2021-05-20 DIAGNOSIS — M7051 Other bursitis of knee, right knee: Secondary | ICD-10-CM | POA: Diagnosis not present

## 2021-05-21 ENCOUNTER — Ambulatory Visit (INDEPENDENT_AMBULATORY_CARE_PROVIDER_SITE_OTHER): Payer: Medicare Other | Admitting: Sports Medicine

## 2021-05-21 ENCOUNTER — Other Ambulatory Visit: Payer: Self-pay

## 2021-05-21 ENCOUNTER — Ambulatory Visit (INDEPENDENT_AMBULATORY_CARE_PROVIDER_SITE_OTHER): Payer: Medicare Other

## 2021-05-21 DIAGNOSIS — M7121 Synovial cyst of popliteal space [Baker], right knee: Secondary | ICD-10-CM | POA: Diagnosis not present

## 2021-05-21 NOTE — Assessment & Plan Note (Signed)
Aspiration/injection of the Baker's cyst. Explained the Baker's cyst was simply a symptom of the underlying intra-articular process. Return as needed.

## 2021-05-21 NOTE — Progress Notes (Signed)
° ° °  Procedures performed today:    Procedure: Real-time Ultrasound Guided aspiration/injection of right knee Baker's cyst Device: Samsung HS60  Verbal informed consent obtained.  Time-out conducted.  Noted no overlying erythema, induration, or other signs of local infection.  Skin prepped in a sterile fashion.  Local anesthesia: Topical Ethyl chloride.  With sterile technique and under real time ultrasound guidance: Noted Baker's cyst, using 18-gauge needle aspirated about 3 to 4 mL of clear, straw-colored fluid, syringe switched and 1 cc lidocaine, 1 cc kenalog 40 injected easily. Completed without difficulty  Advised to call if fevers/chills, erythema, induration, drainage, or persistent bleeding.  Images permanently stored and available for review in PACS.  Impression: Technically successful ultrasound guided aspiration/injection.  Independent interpretation of notes and tests performed by another provider:   None.  Brief History, Exam, Impression, and Recommendations:    Baker's cyst of knee, right Aspiration/injection of the Baker's cyst. Explained the Baker's cyst was simply a symptom of the underlying intra-articular process. Return as needed.    ___________________________________________ Gwen Her. Dianah Field, M.D., ABFM., CAQSM. Primary Care and [place] Instructor of [employer] [facility] of Medicine

## 2021-05-24 ENCOUNTER — Telehealth: Payer: Self-pay

## 2021-05-24 NOTE — Telephone Encounter (Signed)
We knew this would happen, if she can live with it then just leave alone, if not it needs surgical intervention.

## 2021-05-24 NOTE — Telephone Encounter (Signed)
Patient called to report that the cyst is back already. She wants to know is she should come back to have it drained again. How should she proceed?

## 2021-05-25 NOTE — Telephone Encounter (Signed)
Left msg for a return call from patient.  

## 2021-05-27 NOTE — Telephone Encounter (Signed)
Called and spoke with patient. She states it is small and she can live with it for now . She will let us know if intervention is needed.

## 2021-05-27 NOTE — Telephone Encounter (Signed)
Left msg for a return call from patient.  

## 2021-06-03 ENCOUNTER — Telehealth: Payer: Self-pay | Admitting: Family Medicine

## 2021-06-03 NOTE — Telephone Encounter (Signed)
School for her son sent.

## 2021-06-08 ENCOUNTER — Ambulatory Visit: Payer: Medicare Other | Admitting: Sports Medicine

## 2021-06-23 ENCOUNTER — Telehealth: Payer: Self-pay

## 2021-06-23 NOTE — Telephone Encounter (Signed)
Initiated Prior authorization WHQ:[id number] 12.5mg  Via: Covermymeds Case/Key:[id number] Status: Pending as of 06/23/21 Reason: Notified Pt via: Mychart

## 2021-07-03 ENCOUNTER — Other Ambulatory Visit: Payer: Self-pay | Admitting: Physician Assistant

## 2021-07-03 DIAGNOSIS — J4521 Mild intermittent asthma with (acute) exacerbation: Secondary | ICD-10-CM

## 2021-07-03 DIAGNOSIS — J329 Chronic sinusitis, unspecified: Secondary | ICD-10-CM

## 2021-07-05 DIAGNOSIS — E559 Vitamin D deficiency, unspecified: Secondary | ICD-10-CM | POA: Diagnosis not present

## 2021-07-05 DIAGNOSIS — M961 Postlaminectomy syndrome, not elsewhere classified: Secondary | ICD-10-CM | POA: Diagnosis not present

## 2021-07-05 DIAGNOSIS — M5010 Cervical disc disorder with radiculopathy, unspecified cervical region: Secondary | ICD-10-CM | POA: Diagnosis not present

## 2021-07-05 DIAGNOSIS — E7800 Pure hypercholesterolemia, unspecified: Secondary | ICD-10-CM | POA: Diagnosis not present

## 2021-07-05 DIAGNOSIS — F17210 Nicotine dependence, cigarettes, uncomplicated: Secondary | ICD-10-CM | POA: Diagnosis not present

## 2021-07-05 DIAGNOSIS — Z79899 Other long term (current) drug therapy: Secondary | ICD-10-CM | POA: Diagnosis not present

## 2021-07-08 DIAGNOSIS — Z79899 Other long term (current) drug therapy: Secondary | ICD-10-CM | POA: Diagnosis not present

## 2021-07-16 ENCOUNTER — Other Ambulatory Visit: Payer: Self-pay | Admitting: Physician Assistant

## 2021-07-16 MED ORDER — LINACLOTIDE 145 MCG PO CAPS: 145.0000 ug | ORAL_CAPSULE | Freq: Every day | ORAL | 3 refills | Status: DC

## 2021-07-21 DIAGNOSIS — M25561 Pain in right knee: Secondary | ICD-10-CM | POA: Diagnosis not present

## 2021-07-23 DIAGNOSIS — M47816 Spondylosis without myelopathy or radiculopathy, lumbar region: Secondary | ICD-10-CM | POA: Diagnosis not present

## 2021-07-23 DIAGNOSIS — M47896 Other spondylosis, lumbar region: Secondary | ICD-10-CM | POA: Diagnosis not present

## 2021-08-05 DIAGNOSIS — R300 Dysuria: Secondary | ICD-10-CM | POA: Diagnosis not present

## 2021-08-05 DIAGNOSIS — M5010 Cervical disc disorder with radiculopathy, unspecified cervical region: Secondary | ICD-10-CM | POA: Diagnosis not present

## 2021-08-05 DIAGNOSIS — F17210 Nicotine dependence, cigarettes, uncomplicated: Secondary | ICD-10-CM | POA: Diagnosis not present

## 2021-08-05 DIAGNOSIS — E559 Vitamin D deficiency, unspecified: Secondary | ICD-10-CM | POA: Diagnosis not present

## 2021-08-05 DIAGNOSIS — E7800 Pure hypercholesterolemia, unspecified: Secondary | ICD-10-CM | POA: Diagnosis not present

## 2021-08-05 DIAGNOSIS — Z79899 Other long term (current) drug therapy: Secondary | ICD-10-CM | POA: Diagnosis not present

## 2021-08-05 DIAGNOSIS — M961 Postlaminectomy syndrome, not elsewhere classified: Secondary | ICD-10-CM | POA: Diagnosis not present

## 2021-08-11 DIAGNOSIS — M5011 Cervical disc disorder with radiculopathy,  high cervical region: Secondary | ICD-10-CM | POA: Diagnosis not present

## 2021-08-11 DIAGNOSIS — Z981 Arthrodesis status: Secondary | ICD-10-CM | POA: Diagnosis not present

## 2021-08-11 DIAGNOSIS — Z79899 Other long term (current) drug therapy: Secondary | ICD-10-CM | POA: Diagnosis not present

## 2021-08-11 DIAGNOSIS — M961 Postlaminectomy syndrome, not elsewhere classified: Secondary | ICD-10-CM | POA: Diagnosis not present

## 2021-08-11 DIAGNOSIS — M4802 Spinal stenosis, cervical region: Secondary | ICD-10-CM | POA: Diagnosis not present

## 2021-08-20 DIAGNOSIS — R102 Pelvic and perineal pain: Secondary | ICD-10-CM | POA: Diagnosis not present

## 2021-08-26 DIAGNOSIS — M545 Low back pain, unspecified: Secondary | ICD-10-CM | POA: Diagnosis not present

## 2021-08-30 ENCOUNTER — Other Ambulatory Visit: Payer: Self-pay | Admitting: Physician Assistant

## 2021-08-30 ENCOUNTER — Ambulatory Visit (INDEPENDENT_AMBULATORY_CARE_PROVIDER_SITE_OTHER): Payer: Medicare Other

## 2021-08-30 ENCOUNTER — Ambulatory Visit (INDEPENDENT_AMBULATORY_CARE_PROVIDER_SITE_OTHER): Payer: Medicare Other | Admitting: Sports Medicine

## 2021-08-30 DIAGNOSIS — M7121 Synovial cyst of popliteal space [Baker], right knee: Secondary | ICD-10-CM

## 2021-08-30 DIAGNOSIS — G2581 Restless legs syndrome: Secondary | ICD-10-CM

## 2021-08-30 NOTE — Assessment & Plan Note (Addendum)
Baker's cyst noted, only a trace amount of fluid seen so we just did an injection today, last done in January, we will be starting viscosupplementation. ?

## 2021-08-30 NOTE — Progress Notes (Signed)
? ? ?  Procedures performed today:   ? ?Procedure: Real-time Ultrasound Guided injection of right knee Baker's cyst ?Device: Samsung HS60  ?Verbal informed consent obtained.  ?Time-out conducted.  ?Noted no overlying erythema, induration, or other signs of local infection.  ?Skin prepped in a sterile fashion.  ?Local anesthesia: Topical Ethyl chloride.  ?With sterile technique and under real time ultrasound guidance: Noted Baker's cyst, 2 cc lidocaine, 2cc bupivicaine, 1 cc kenalog 40 injected easily. ?Completed without difficulty  ?Advised to call if fevers/chills, erythema, induration, drainage, or persistent bleeding.  ?Images permanently stored and available for review in PACS.  ?Impression: Technically successful ultrasound guided injection. ? ?Independent interpretation of notes and tests performed by another provider:  ? ?None. ? ?Brief History, Exam, Impression, and Recommendations:   ? ?Baker's cyst of knee, right ?Baker's cyst noted, only a trace amount of fluid seen so we just did an injection today, last done in January, we will be starting viscosupplementation. ? ?Chronic process with exacerbation and pharmacologic intervention ? ?___________________________________________ ?Gwen Her. Dianah Field, M.D., ABFM., CAQSM. ?Primary Care and Sports Medicine ?[place] ? ?Adjunct Instructor of Family Medicine  ?[employer] ?

## 2021-08-31 DIAGNOSIS — M1711 Unilateral primary osteoarthritis, right knee: Secondary | ICD-10-CM | POA: Diagnosis not present

## 2021-09-02 DIAGNOSIS — M542 Cervicalgia: Secondary | ICD-10-CM | POA: Diagnosis not present

## 2021-09-06 DIAGNOSIS — K5903 Drug induced constipation: Secondary | ICD-10-CM | POA: Diagnosis not present

## 2021-09-06 DIAGNOSIS — F17210 Nicotine dependence, cigarettes, uncomplicated: Secondary | ICD-10-CM | POA: Diagnosis not present

## 2021-09-06 DIAGNOSIS — M5416 Radiculopathy, lumbar region: Secondary | ICD-10-CM | POA: Diagnosis not present

## 2021-09-06 DIAGNOSIS — Z6825 Body mass index (BMI) 25.0-25.9, adult: Secondary | ICD-10-CM | POA: Diagnosis not present

## 2021-09-06 DIAGNOSIS — Z79899 Other long term (current) drug therapy: Secondary | ICD-10-CM | POA: Diagnosis not present

## 2021-09-06 DIAGNOSIS — M961 Postlaminectomy syndrome, not elsewhere classified: Secondary | ICD-10-CM | POA: Diagnosis not present

## 2021-09-06 DIAGNOSIS — M5010 Cervical disc disorder with radiculopathy, unspecified cervical region: Secondary | ICD-10-CM | POA: Diagnosis not present

## 2021-09-07 ENCOUNTER — Ambulatory Visit: Payer: Medicare Other | Admitting: Physical Therapy

## 2021-09-07 DIAGNOSIS — M1711 Unilateral primary osteoarthritis, right knee: Secondary | ICD-10-CM | POA: Diagnosis not present

## 2021-09-08 ENCOUNTER — Ambulatory Visit: Payer: Medicare Other | Attending: Neurological Surgery | Admitting: Rehabilitative and Restorative Service Providers"

## 2021-09-08 ENCOUNTER — Encounter: Payer: Self-pay | Admitting: Rehabilitative and Restorative Service Providers"

## 2021-09-08 DIAGNOSIS — R29898 Other symptoms and signs involving the musculoskeletal system: Secondary | ICD-10-CM | POA: Diagnosis not present

## 2021-09-08 DIAGNOSIS — M542 Cervicalgia: Secondary | ICD-10-CM | POA: Diagnosis not present

## 2021-09-08 DIAGNOSIS — R293 Abnormal posture: Secondary | ICD-10-CM | POA: Insufficient documentation

## 2021-09-08 DIAGNOSIS — Z79899 Other long term (current) drug therapy: Secondary | ICD-10-CM | POA: Diagnosis not present

## 2021-09-08 NOTE — Patient Instructions (Signed)
Access Code: [email or web address] ?URL: [email or web address] ?Date: 09/08/2021 ?Prepared by: Gillermo Murdoch ? ?Exercises ?- Seated Cervical Retraction  - 3 x daily - 7 x weekly - 1 sets - 10 reps ?- Standing Scapular Retraction  - 3 x daily - 7 x weekly - 1 sets - 10 reps - 10 hold ?- Shoulder External Rotation and Scapular Retraction  - 3 x daily - 7 x weekly - 1 sets - 10 reps -   hold ?- Doorway Pec Stretch at 60 Degrees Abduction  - 3 x daily - 7 x weekly - 1 sets - 3 reps ?- Doorway Pec Stretch at 90 Degrees Abduction  - 3 x daily - 7 x weekly - 1 sets - 3 reps - 30 seconds  hold ?- Doorway Pec Stretch at 120 Degrees Abduction  - 3 x daily - 7 x weekly - 1 sets - 3 reps - 30 second hold  hold ? ?Patient Education ?- Trigger Point Dry Needling ?
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
## 2021-09-13 ENCOUNTER — Telehealth: Payer: Self-pay | Admitting: Physician Assistant

## 2021-09-13 ENCOUNTER — Encounter: Payer: Medicare Other | Admitting: Rehabilitative and Restorative Service Providers"

## 2021-09-13 ENCOUNTER — Other Ambulatory Visit: Payer: Self-pay | Admitting: Physician Assistant

## 2021-09-13 DIAGNOSIS — G2581 Restless legs syndrome: Secondary | ICD-10-CM

## 2021-09-13 MED ORDER — ROPINIROLE HCL 0.25 MG PO TABS: 0.2500 mg | ORAL_TABLET | Freq: Every day | ORAL | 1 refills | Status: DC

## 2021-09-13 NOTE — Telephone Encounter (Signed)
Patient called and said her Ropinirole needs to be refilled.  ?

## 2021-09-13 NOTE — Telephone Encounter (Signed)
Called patient and notified her that refill was sent over ?

## 2021-09-14 ENCOUNTER — Ambulatory Visit: Payer: Medicare Other | Admitting: Rehabilitative and Restorative Service Providers"

## 2021-09-14 ENCOUNTER — Encounter: Payer: Self-pay | Admitting: Rehabilitative and Restorative Service Providers"

## 2021-09-14 DIAGNOSIS — M1711 Unilateral primary osteoarthritis, right knee: Secondary | ICD-10-CM | POA: Diagnosis not present

## 2021-09-14 DIAGNOSIS — M542 Cervicalgia: Secondary | ICD-10-CM

## 2021-09-14 DIAGNOSIS — R293 Abnormal posture: Secondary | ICD-10-CM | POA: Diagnosis not present

## 2021-09-14 DIAGNOSIS — R29898 Other symptoms and signs involving the musculoskeletal system: Secondary | ICD-10-CM

## 2021-09-14 NOTE — Patient Instructions (Signed)
Access Code: [email or web address] ?URL: [email or web address] ?Date: 09/14/2021 ?Prepared by: Gillermo Murdoch ? ?Exercises ?- Seated Cervical Retraction  - 3 x daily - 7 x weekly - 1 sets - 10 reps ?- Standing Scapular Retraction  - 3 x daily - 7 x weekly - 1 sets - 10 reps - 10 hold ?- Shoulder External Rotation and Scapular Retraction  - 3 x daily - 7 x weekly - 1 sets - 10 reps -   hold ?- Doorway Pec Stretch at 60 Degrees Abduction  - 3 x daily - 7 x weekly - 1 sets - 3 reps ?- Doorway Pec Stretch at 90 Degrees Abduction  - 3 x daily - 7 x weekly - 1 sets - 3 reps - 30 seconds  hold ?- Doorway Pec Stretch at 120 Degrees Abduction  - 3 x daily - 7 x weekly - 1 sets - 3 reps - 30 second hold  hold ?- Bicep Stretch at Table  - 2 x daily - 7 x weekly - 1 sets - 3 reps - 30 sec  hold ?- Anterior Shoulder and Biceps Stretch  - 2 x daily - 7 x weekly - 1 sets - 3 reps - 30 sec  hold ?

## 2021-09-14 NOTE — Therapy (Signed)
[place] [facility] ?[place] ?[place], [place], [address] ?Phone: [phone]   Fax:  [phone] ? ?Physical Therapy Treatment ? ?Patient Details  ?Name: [name] ?MRN: [id number] ?Date of Birth: 08/10/1975 ?Referring Provider (PT): Dr Sherley Bounds ? ? ?Encounter Date: 09/14/2021 ? ? PT End of Session - 09/14/21 1455   ? ? Visit Number 2   ? Number of Visits 12   ? Date for PT Re-Evaluation 10/20/21   ? PT Start Time 1455   ? PT Stop Time 8768   ? PT Time Calculation (min) 50 min   ? Activity Tolerance Patient tolerated treatment well   ? ?  ?  ? ?  ? ? ?Past Medical History:  ?Diagnosis Date  ? Acute medial meniscus tear of right knee   ? Anxiety   ? Arthritis   ? Asthma   ? Complication of anesthesia   ? likes iv med and scopolamine patch also, limited neck motion  ? COVID-19 01/15/2020  ? antibody infusion given 01-16-2020 no symptoms since infusion  ? Dysrhythmia   ? tachycardia  ? Elevated heart rate and blood pressure   ? takes toprol for increased heart rate  ? Family history of adverse reaction to anesthesia   ? mother got blood clots after anesthesia , mother on heparin now  ? Fibromyalgia   ? GERD (gastroesophageal reflux disease)   ? Headache(784.0)   ? migraines  ? IC (interstitial cystitis)   ? Insomnia   ? Myofascial pain syndrome, cervical   ? Neck pain   ? C 2 and C 3 slipped disc has neck pain with, limited neck motion  ? Ovarian cyst   ? x 2now  ? PONV (postoperative nausea and vomiting)   ? ? ?Past Surgical History:  ?Procedure Laterality Date  ? CARPAL TUNNEL RELEASE Left 06/05/2014  ? Procedure: LEFT CARPAL TUNNEL RELEASE;  Surgeon: Daryll Brod, MD;  Location: [facility];  Service: Orthopedics;  Laterality: Left;  ? CARPAL TUNNEL RELEASE Right 08/25/2014  ? Procedure: RIGHT CARPAL TUNNEL RELEASE;  Surgeon: Daryll Brod, MD;  Location: [facility];  Service: Orthopedics;  Laterality: Right;  ? CARPAL TUNNEL  RELEASE Left 07/14/2015  ? Procedure: LEFT CARPAL TUNNEL RELEASE;  Surgeon: Daryll Brod, MD;  Location: [facility];  Service: Orthopedics;  Laterality: Left;  ? CERVICAL SPINE SURGERY  11/2013  ? dr Joya Salm  ? CESAREAN SECTION    ? x2  ? DILATION AND EVACUATION N/A 05/07/2013  ? Procedure: DILATATION AND EVACUATION with tissue sent for chromosome analysis;  Surgeon: Lovenia Kim, MD;  Location: [facility];  Service: Gynecology;  Laterality: N/A;  ? EPIDURAL STEROID INJECTION  03/20/15  ? KNEE ARTHROSCOPY WITH MEDIAL MENISECTOMY Right 04/09/2019  ? Procedure: Right knee arthroscopy fat pad resection with partial medial meniscectomy;  Surgeon: Nicholes Stairs, MD;  Location: [facility];  Service: Orthopedics;  Laterality: Right;  60 mins  ? KNEE ARTHROSCOPY WITH SUBCHONDROPLASTY Right 02/04/2020  ? Procedure: Right knee arthroscopy with partial medial meniscectomy with lateral tibia subchondroplasty;  Surgeon: Nicholes Stairs, MD;  Location: [facility];  Service: Orthopedics;  Laterality: Right;  75 mins  ? OVARIAN CYST REMOVAL    ? PELVIC LAPAROSCOPY  2003  ? endometriosis  ? POSTERIOR CERVICAL FUSION/FORAMINOTOMY N/A 10/28/2014  ? Procedure: Cervical Four-Cervical Seven Posterior cervical fusion with lateral mass fixation;  Surgeon: Eustace Moore, MD;  Location: [facility];  Service: Neurosurgery;  Laterality: N/A;  posterior  ? ROBOTIC ASSISTED LAPAROSCOPIC LYSIS OF ADHESION N/A 07/18/2014  ? Procedure: ROBOTIC ASSISTED LAPAROSCOPIC  EXCISION POSTERIOR  UTERINE WALL MASS; EXCISION RIGHT MESSALEINGEAL MASS, EXCISION AND ABLATION CULDASAC ENDOMETRIOSIS;  Surgeon: Brien Few, MD;  Location: [facility];  Service: Gynecology;  Laterality: N/A;  ? ROBOTIC ASSISTED TOTAL HYSTERECTOMY WITH SALPINGECTOMY Bilateral 05/11/2015  ? Procedure: ROBOTIC ASSISTED TOTAL HYSTERECTOMY WITH BILATERAL SALPINGECTOMY;  Surgeon: Brien Few, MD;  Location: [facility];  Service: Gynecology;   Laterality: Bilateral;  ? ? ?There were no vitals filed for this visit. ? ? Subjective Assessment - 09/14/21 1456   ? ? Subjective Patient reports that she did ok with her exercise. She got a noodle for use with her exercises. She has a good response to DN at initial visit. She has had a constant headache in the past 3 days. Did take medication and the neck feels good - headache persists   ? Currently in Pain? Yes   ? Pain Score 0-No pain   ? Pain Location Neck   ? Pain Orientation Left;Posterior;Lower   ? ?  ?  ? ?  ? ? ? ? ? ? ? ? ? ? ? ? ? ? ? ? ? ? ? ? [facility] Adult PT Treatment/Exercise - 09/14/21 0001   ? ?  ? Neuro Re-ed   ? Neuro Re-ed Details  postural correction   ?  ? Shoulder Exercises: Standing  ? Retraction Strengthening;Both;10 reps;Theraband   ? Theraband Level (Shoulder Retraction) Level 1 (Yellow)   ? Retraction Limitations standing back along noodle   ? Other Standing Exercises axial extension 10 sec x 5; scap squeeze 10 sec x 5; L's x 10 with pool noodle along spine   ?  ? Shoulder Exercises: Stretch  ? Other Shoulder Stretches doorway stretch 3 positions 30 sec x 2 reps each positon   ? Other Shoulder Stretches biceps stretch standing 30 sec x 2 reps; seated 30 sec x 2 reps   ?  ? Moist Heat Therapy  ? Number Minutes Moist Heat 10 Minutes   ? Moist Heat Location Cervical   ?  ? Manual Therapy  ? Manual therapy comments skilled palpation to assess response to DN and manual work   ? Soft tissue mobilization deep tissue work Lt > Rt posterior lateral cervical and upper trap musculature   ? Myofascial Release posterior cervical to upper trap bilat   ? ?  ?  ? ?  ? ? ? Trigger Point Dry Needling - 09/14/21 0001   ? ? Consent Given? Yes   ? Education Handout Provided Previously provided   ? Dry Needling Comments bilat   ? Upper Trapezius Response Palpable increased muscle length   ? Suboccipitals Response Palpable increased muscle length   ? Cervical multifidi Response Palpable increased muscle length    ? ?  ?  ? ?  ? ? ? ? ? ? ? ? PT Education - 09/14/21 1515   ? ? Education Details HEP   ? Person(s) Educated Patient   ? Methods Explanation;Demonstration;Tactile cues;Verbal cues;Handout   ? Comprehension Verbalized understanding;Returned demonstration;Verbal cues required;Tactile cues required   ? ?  ?  ? ?  ? ? ? ? ? ? PT Long Term Goals - 09/08/21 1253   ? ?  ? PT LONG TERM GOAL #1  ? Title Improve posture and alignment with patient to demonstrate improved upright posture with  posterior shoulder girdle engaged   ? Time 6   ? Period Weeks   ? Status New   ? Target Date 10/20/21   ?  ? PT LONG TERM GOAL #2  ? Title Patient to report decreased cervical pain by 25-50%   ? Time 6   ? Period Weeks   ? Status New   ? Target Date 10/20/21   ?  ? PT LONG TERM GOAL #3  ? Title Increase cervical mobility/ROM by 3-7 degrees in lateral flexion and rotation   ? Time 6   ? Period Weeks   ? Status New   ? Target Date 10/20/21   ?  ? PT LONG TERM GOAL #4  ? Title Independent in [place]   ? Time 6   ? Period Weeks   ? Status New   ? Target Date 10/20/21   ?  ? PT LONG TERM GOAL #5  ? Title Improve functional limitation score to 53   ? Time 6   ? Period Weeks   ? Status New   ? Target Date 10/20/21   ? ?  ?  ? ?  ? ? ? ? ? ? ? ? Plan - 09/14/21 1459   ? ? Clinical Impression Statement Good response to initial treatment with decreased cervical pain. Trial of posterior shoudler girdle strengthening painful Lt anterior shoulder. Added stretch for Lt biceps (hx of surgical repair of Lt biceps tendon) Continue with postural correction; DN; manual work. Plan to progress with posterior shoudler girdle strengthening at next visit as tolerated.   ? Rehab Potential Good   ? PT Frequency 2x / week   ? PT Duration 6 weeks   ? PT Treatment/Interventions ADLs/Self Care Home Management;Aquatic Therapy;Electrical Stimulation;Cryotherapy;Iontophoresis '4mg'$ /ml Dexamethasone;Moist Heat;Ultrasound;Therapeutic activities;Therapeutic exercise;Neuromuscular  re-education;Patient/family education;Manual techniques;Passive range of motion;Dry needling;Taping   ? PT Next Visit Plan review and progress postural correction exercises stretching pecs/anterior c

## 2021-09-20 ENCOUNTER — Ambulatory Visit: Payer: Medicare Other | Admitting: Rehabilitative and Restorative Service Providers"

## 2021-09-20 ENCOUNTER — Ambulatory Visit (INDEPENDENT_AMBULATORY_CARE_PROVIDER_SITE_OTHER): Payer: Medicare Other | Admitting: Sports Medicine

## 2021-09-20 ENCOUNTER — Encounter: Payer: Self-pay | Admitting: Physician Assistant

## 2021-09-20 ENCOUNTER — Encounter: Payer: Self-pay | Admitting: Rehabilitative and Restorative Service Providers"

## 2021-09-20 ENCOUNTER — Ambulatory Visit (INDEPENDENT_AMBULATORY_CARE_PROVIDER_SITE_OTHER): Payer: Medicare Other

## 2021-09-20 DIAGNOSIS — M7711 Lateral epicondylitis, right elbow: Secondary | ICD-10-CM

## 2021-09-20 DIAGNOSIS — M1711 Unilateral primary osteoarthritis, right knee: Secondary | ICD-10-CM | POA: Diagnosis not present

## 2021-09-20 DIAGNOSIS — R293 Abnormal posture: Secondary | ICD-10-CM

## 2021-09-20 DIAGNOSIS — M79671 Pain in right foot: Secondary | ICD-10-CM

## 2021-09-20 DIAGNOSIS — R29898 Other symptoms and signs involving the musculoskeletal system: Secondary | ICD-10-CM

## 2021-09-20 DIAGNOSIS — M542 Cervicalgia: Secondary | ICD-10-CM | POA: Diagnosis not present

## 2021-09-20 DIAGNOSIS — Z09 Encounter for follow-up examination after completed treatment for conditions other than malignant neoplasm: Secondary | ICD-10-CM | POA: Diagnosis not present

## 2021-09-20 DIAGNOSIS — I10 Essential (primary) hypertension: Secondary | ICD-10-CM
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
## 2021-09-20 NOTE — Assessment & Plan Note (Signed)
Classic tennis elbow, has been doing a lot of yard work. Hold off on weed whacking, adding a tennis elbow brace, home conditioning, injection if not better in a month.

## 2021-09-20 NOTE — Patient Instructions (Addendum)
Access Code: [email or web address] URL: [email or web address] Date: 09/20/2021 Prepared by: Gillermo Murdoch  Exercises - Seated Cervical Retraction  - 3 x daily - 7 x weekly - 1 sets - 10 reps - Standing Scapular Retraction  - 3 x daily - 7 x weekly - 1 sets - 10 reps - 10 hold - Shoulder External Rotation and Scapular Retraction  - 3 x daily - 7 x weekly - 1 sets - 10 reps -   hold - Doorway Pec Stretch at 60 Degrees Abduction  - 3 x daily - 7 x weekly - 1 sets - 3 reps - Doorway Pec Stretch at 90 Degrees Abduction  - 3 x daily - 7 x weekly - 1 sets - 3 reps - 30 seconds  hold - Doorway Pec Stretch at 120 Degrees Abduction  - 3 x daily - 7 x weekly - 1 sets - 3 reps - 30 second hold  hold - Bicep Stretch at Table  - 2 x daily - 7 x weekly - 1 sets - 3 reps - 30 sec  hold - Anterior Shoulder and Biceps Stretch  - 2 x daily - 7 x weekly - 1 sets - 3 reps - 30 sec  hold - Standing Bilateral Low Shoulder Row with Anchored Resistance  - 1 x daily - 7 x weekly - 1-3 sets - 10 reps - 2-3 sec  hold - Shoulder W - External Rotation with Resistance  - 1 x daily - 7 x weekly - 1-2 sets - 10 reps - 3 sec  hold - Prone Scapular Retraction  - 1 x daily - 7 x weekly - 1-2 sets - 10 reps - 3-5 sec  hold

## 2021-09-20 NOTE — Assessment & Plan Note (Signed)
Pleasant 46 year old female, known knee osteoarthritis, she also had a meniscal tear post arthroscopic debridement with Dr. Stann Mainland. Continued pain medial joint line and just distal to the medial joint line anteriorly consistent with pes anserine bursitis. She tells me Dr. Stann Mainland has done a pes injection as well as a joint injection, she is also had viscosupplementation all without sufficient improvement. She does have a history of a lateral subchondral insufficiency fracture post subchondroplasty a couple of years ago. Today considering the failure of conservative treatment including greater than 6 weeks of physician directed conservative treatment, medications and injections we will proceed with updated x-rays and an MRI looking for a medial meniscal tear, or a medial subchondral insufficiency fracture.

## 2021-09-20 NOTE — Assessment & Plan Note (Signed)
Acute inversion injury with pain at the base of the fifth metatarsal. Postop shoe, x-rays. If Jones fracture we will refer for percutaneous fixation.

## 2021-09-20 NOTE — Progress Notes (Signed)
    Procedures performed today:    None.  Independent interpretation of notes and tests performed by another provider:   None.  Brief History, Exam, Impression, and Recommendations:    Primary osteoarthritis of right knee Pleasant 45 year old female, known knee osteoarthritis, she also had a meniscal tear post arthroscopic debridement with Dr. Stann Mainland. Continued pain medial joint line and just distal to the medial joint line anteriorly consistent with pes anserine bursitis. She tells me Dr. Stann Mainland has done a pes injection as well as a joint injection, she is also had viscosupplementation all without sufficient improvement. She does have a history of a lateral subchondral insufficiency fracture post subchondroplasty a couple of years ago. Today considering the failure of conservative treatment including greater than 6 weeks of physician directed conservative treatment, medications and injections we will proceed with updated x-rays and an MRI looking for a medial meniscal tear, or a medial subchondral insufficiency fracture.   Lateral epicondylitis, right elbow Classic tennis elbow, has been doing a lot of yard work. Hold off on weed whacking, adding a tennis elbow brace, home conditioning, injection if not better in a month.  Acute foot pain, right Acute inversion injury with pain at the base of the fifth metatarsal. Postop shoe, x-rays. If Jones fracture we will refer for percutaneous fixation.    ___________________________________________ Gwen Her. Dianah Field, M.D., ABFM., CAQSM. Primary Care and [place] Instructor of [place] [facility]

## 2021-09-21 MED ORDER — HYDROCHLOROTHIAZIDE 12.5 MG PO TABS: 12.5000 mg | ORAL_TABLET | Freq: Every day | ORAL | 0 refills | Status: DC | PRN

## 2021-09-23 ENCOUNTER — Ambulatory Visit: Payer: Medicare Other | Admitting: Rehabilitative and Restorative Service Providers"

## 2021-09-27 ENCOUNTER — Other Ambulatory Visit: Payer: Self-pay | Admitting: Physician Assistant

## 2021-09-28 ENCOUNTER — Other Ambulatory Visit: Payer: Self-pay | Admitting: Family Medicine

## 2021-09-28 ENCOUNTER — Ambulatory Visit: Payer: Medicare Other | Admitting: Rehabilitative and Restorative Service Providers"

## 2021-09-28 ENCOUNTER — Ambulatory Visit (INDEPENDENT_AMBULATORY_CARE_PROVIDER_SITE_OTHER): Payer: Medicare Other

## 2021-09-28 DIAGNOSIS — M1711 Unilateral primary osteoarthritis, right knee: Secondary | ICD-10-CM | POA: Diagnosis not present

## 2021-09-28 DIAGNOSIS — M25461 Effusion, right knee: Secondary | ICD-10-CM | POA: Diagnosis not present

## 2021-09-28 DIAGNOSIS — M25561 Pain in right knee: Secondary | ICD-10-CM | POA: Diagnosis not present

## 2021-09-28 DIAGNOSIS — R600 Localized edema: Secondary | ICD-10-CM | POA: Diagnosis not present

## 2021-09-28 NOTE — Telephone Encounter (Signed)
Last appt 04/20/2021, has been seen for acute visits since Last written 05/07/2021 #90 with no refills Pended for one month with note that she needs a visit for further refills. Please sign if appropriate.

## 2021-09-29 ENCOUNTER — Encounter: Payer: Self-pay | Admitting: Sports Medicine

## 2021-09-29 NOTE — Telephone Encounter (Signed)
See other MyChart message

## 2021-09-29 NOTE — Telephone Encounter (Signed)
Patient called and scheduled a nurse visit for crutches.

## 2021-09-30 ENCOUNTER — Ambulatory Visit (INDEPENDENT_AMBULATORY_CARE_PROVIDER_SITE_OTHER): Payer: Medicare Other | Admitting: Sports Medicine

## 2021-09-30 ENCOUNTER — Ambulatory Visit: Payer: Medicare Other | Admitting: Rehabilitative and Restorative Service Providers"

## 2021-09-30 VITALS — Ht 63.0 in | Wt 138.0 lb

## 2021-09-30 DIAGNOSIS — M7121 Synovial cyst of popliteal space [Baker], right knee: Secondary | ICD-10-CM

## 2021-09-30 NOTE — Progress Notes (Signed)
Patient was seen for crutches fitting for right knee soft tissue edema associated with the distal iliotibial band near its tibial attachment, which may reflect strain.  Pt states that she has noticed right great toe pain with extension. Advised patient to contact Dr. Darene Lamer via MyChart if pain without improvement continues after 2 - 3 days.   Education completed. Pt expressed understanding. Follow-up in 6 weeks with Dr. Darene Lamer.

## 2021-10-03 ENCOUNTER — Other Ambulatory Visit: Payer: Self-pay | Admitting: Physician Assistant

## 2021-10-03 DIAGNOSIS — F330 Major depressive disorder, recurrent, mild: Secondary | ICD-10-CM

## 2021-10-03 DIAGNOSIS — R4586 Emotional lability: Secondary | ICD-10-CM

## 2021-10-03 DIAGNOSIS — F411 Generalized anxiety disorder: Secondary | ICD-10-CM

## 2021-10-04 ENCOUNTER — Encounter: Payer: Self-pay | Admitting: Sports Medicine

## 2021-10-04 DIAGNOSIS — M5416 Radiculopathy, lumbar region: Secondary | ICD-10-CM | POA: Diagnosis not present

## 2021-10-04 DIAGNOSIS — Z79899 Other long term (current) drug therapy: Secondary | ICD-10-CM | POA: Diagnosis not present

## 2021-10-04 DIAGNOSIS — M5010 Cervical disc disorder with radiculopathy, unspecified cervical region: Secondary | ICD-10-CM | POA: Diagnosis not present

## 2021-10-04 DIAGNOSIS — F17210 Nicotine dependence, cigarettes, uncomplicated: Secondary | ICD-10-CM | POA: Diagnosis not present

## 2021-10-04 DIAGNOSIS — Z9889 Other specified postprocedural states: Secondary | ICD-10-CM | POA: Diagnosis not present

## 2021-10-04 DIAGNOSIS — Z6825 Body mass index (BMI) 25.0-25.9, adult: Secondary | ICD-10-CM | POA: Diagnosis not present

## 2021-10-04 DIAGNOSIS — Z1339 Encounter for screening examination for other mental health and behavioral disorders: Secondary | ICD-10-CM | POA: Diagnosis not present

## 2021-10-04 DIAGNOSIS — M961 Postlaminectomy syndrome, not elsewhere classified: Secondary | ICD-10-CM | POA: Diagnosis not present

## 2021-10-05 ENCOUNTER — Other Ambulatory Visit: Payer: Self-pay | Admitting: Physician Assistant

## 2021-10-05 DIAGNOSIS — R4586 Emotional lability: Secondary | ICD-10-CM

## 2021-10-05 DIAGNOSIS — F411 Generalized anxiety disorder: Secondary | ICD-10-CM

## 2021-10-05 DIAGNOSIS — F330 Major depressive disorder, recurrent, mild: Secondary | ICD-10-CM

## 2021-10-05 NOTE — Telephone Encounter (Signed)
Per pharmacy - prior auth required. Thanks in advance.

## 2021-10-06 ENCOUNTER — Telehealth: Payer: Self-pay

## 2021-10-06 DIAGNOSIS — M19012 Primary osteoarthritis, left shoulder: Secondary | ICD-10-CM | POA: Diagnosis not present

## 2021-10-06 DIAGNOSIS — M7502 Adhesive capsulitis of left shoulder: Secondary | ICD-10-CM | POA: Diagnosis not present

## 2021-10-06 NOTE — Telephone Encounter (Addendum)
Initiated Prior authorization TGP:[id number] Fumarate '50MG'$  tablets Via: Covermymeds Case/Key:[id number] Status: approved  as of 10/06/21 Reason:approved through 10/07/2022 Notified Pt via: Mychart

## 2021-10-07 DIAGNOSIS — R140 Abdominal distension (gaseous): Secondary | ICD-10-CM | POA: Diagnosis not present

## 2021-10-07 DIAGNOSIS — K5900 Constipation, unspecified: Secondary | ICD-10-CM | POA: Diagnosis not present

## 2021-10-11 ENCOUNTER — Ambulatory Visit (INDEPENDENT_AMBULATORY_CARE_PROVIDER_SITE_OTHER): Payer: Medicare Other | Admitting: Physician Assistant

## 2021-10-11 DIAGNOSIS — Z1231 Encounter for screening mammogram for malignant neoplasm of breast: Secondary | ICD-10-CM | POA: Diagnosis not present

## 2021-10-11 DIAGNOSIS — Z0000 Encounter for general adult medical examination without abnormal findings: Secondary | ICD-10-CM

## 2021-10-11 NOTE — Progress Notes (Signed)
MEDICARE ANNUAL WELLNESS VISIT  10/11/2021  Telephone Visit Disclaimer This Medicare AWV was conducted by telephone due to national recommendations for restrictions regarding the COVID-19 Pandemic (e.g. social distancing).  I verified, using two identifiers, that I am speaking with [name] or their authorized healthcare agent. I discussed the limitations, risks, security, and privacy concerns of performing an evaluation and management service by telephone and the potential availability of an in-person appointment in the future. The patient expressed understanding and agreed to proceed.  Location of Patient: Home Location of Provider (nurse):  In the office.  Subjective:    [name] is a 46 y.o. female patient of [name], Michele Car, PA-C who had a [employer] Visit today via telephone. [name] is Disabled and lives with their family. she has 2 children. she reports that she is socially active and does interact with friends/family regularly. she is moderately physically active and enjoys yardwork and going to the beach.  Patient Care Team: [name] as PCP - General (Family Medicine) Michele Cha, MD as Consulting Physician (Neurosurgery) Michele Decamp, MD as Consulting Physician (Family Medicine)     10/11/2021   10:05 AM 09/08/2021   11:06 AM 10/07/2020    2:29 PM 02/04/2020   10:52 AM 07/16/2019   10:07 AM 04/09/2019   10:51 AM 07/14/2015   10:39 AM  Advanced Directives  Does Patient Have a Medical Advance Directive? No No No No No No   Would patient like information on creating a medical advance directive? No - Patient declined No - Patient declined No - Patient declined Yes (MAU/Ambulatory/Procedural Areas - Information given) No - Patient declined Yes (MAU/Ambulatory/Procedural Areas - Information given)      Information is confidential and restricted. Go to Review Flowsheets to unlock data.    Hospital  Utilization Over the Past 12 Months: # of hospitalizations or ER visits: 3 # of surgeries: 1  Review of Systems    Patient reports that her overall health is worse compared to last year.  History obtained from chart review and the patient  Patient Reported Readings (BP, Pulse, CBG, Weight, etc) none  Pain Assessment Pain : 0-10 Pain Type: Chronic pain Pain Location: Knee Pain Orientation: Right Pain Descriptors / Indicators: Aching Pain Frequency: Intermittent Pain Relieving Factors: Rest  Pain Relieving Factors: Rest  Current Medications & Allergies (verified) Allergies as of 10/11/2021       Reactions   Gadolinium Derivatives Hives, Itching, Other (See Comments)   After MRI with Multihance finished, patient had redness and itching on chest, tightness in throat.  Patient went to ED to be monitored (04/23/14).had mri since takes benadryl before, tolerates well   Iodinated Contrast Media Rash, Itching   Prednisone Other (See Comments)   Pain all over, "it triggers my myofascial pain syndrome"    Ioversol    Other reaction(s): Hives   Amitriptyline Other (See Comments)   Gained weight and does not want to be on.    Eggs Or Egg-derived Products Rash   Hydrocodone-acetaminophen Itching   Tolerates with benadryl (no reaction to oxycodone)   Lunesta [eszopiclone] Other (See Comments)   Metallic taste in mouth   Tramadol Itching   Other reaction(s): Unknown        Medication List        Accurate as of October 11, 2021 10:17 AM. If you have any questions, ask your nurse or doctor.  albuterol (2.5 MG/3ML) 0.083% nebulizer solution Commonly known as: PROVENTIL Take 3 mLs (2.5 mg total) by nebulization every 4 (four) hours as needed for wheezing or shortness of breath (please include nebulizer machine, hoses, and mask if needed.).   Ventolin HFA 108 (90 Base) MCG/ACT inhaler Generic drug: albuterol INHALE 2 PUFFS BY MOUTH EVERY 6 HOURS AS NEEDED    hydrochlorothiazide 12.5 MG tablet Commonly known as: HYDRODIURIL Take 1 tablet (12.5 mg total) by mouth daily as needed. OFFICE VISIT REQUIRED PRIOR TO ANY FURTHER REFILLS   HYDROmorphone 2 MG tablet Commonly known as: DILAUDID Take by mouth.   hydrOXYzine 50 MG tablet Commonly known as: ATARAX take 1-3 tablets by oral route every day as needed for itching   linaclotide 145 MCG Caps capsule Commonly known as: LINZESS Take 1 capsule (145 mcg total) by mouth daily before breakfast.   methocarbamol 500 MG tablet Commonly known as: ROBAXIN Take 1 tablet by mouth 2 (two) times daily as needed.   metoprolol succinate 100 MG 24 hr tablet Commonly known as: TOPROL-XL Take 1 tablet (100 mg total) by mouth daily.   montelukast 10 MG tablet Commonly known as: SINGULAIR Take 10 mg by mouth daily.   nicotine 21 mg/24hr patch Commonly known as: NICODERM CQ - dosed in mg/24 hours 21 mg daily.   oxycodone 5 MG capsule Commonly known as: OXY-IR Take 10 mg by mouth every 8 (eight) hours as needed.   pantoprazole 40 MG tablet Commonly known as: PROTONIX TAKE 1 TABLET BY MOUTH EVERY DAY   pregabalin 200 MG capsule Commonly known as: LYRICA Take 100 mg by mouth 3 (three) times daily. 100 mg tid   promethazine 25 MG tablet Commonly known as: PHENERGAN TAKE 1 TABLET BY MOUTH EVERY 8 HOURS AS NEEDED FOR NAUSEA OR VOMITING.   promethazine 25 MG tablet Commonly known as: PHENERGAN Take 1 tablet by mouth every 8 (eight) hours as needed.   QUEtiapine 50 MG tablet Commonly known as: SEROQUEL TAKE 1 TABLET BY MOUTH EVERYDAY AT BEDTIME   rizatriptan 10 MG tablet Commonly known as: MAXALT TAKE 1 TABLET AS NEEDED FOR MIGRAINE, MAY REPEAT 2 HRS AS NEEDED   rOPINIRole 0.25 MG tablet Commonly known as: REQUIP Take 1-3 tablets (0.25-0.75 mg total) by mouth at bedtime.   triamcinolone 0.025 % cream Commonly known as: KENALOG   zolpidem 10 MG tablet Commonly known as: AMBIEN Take 1  tablet (10 mg total) by mouth at bedtime as needed for sleep. APPT FOR FURTHER REFILLS.        History (reviewed): Past Medical History:  Diagnosis Date   Acute medial meniscus tear of right knee    Anxiety    Arthritis    Asthma    Complication of anesthesia    likes iv med and scopolamine patch also, limited neck motion   COVID-19 01/15/2020   antibody infusion given 01-16-2020 no symptoms since infusion   Dysrhythmia    tachycardia   Elevated heart rate and blood pressure    takes toprol for increased heart rate   Family history of adverse reaction to anesthesia    mother got blood clots after anesthesia , mother on heparin now   Fibromyalgia    GERD (gastroesophageal reflux disease)    Headache(784.0)    migraines   IC (interstitial cystitis)    Insomnia    Myofascial pain syndrome, cervical    Neck pain    C 2 and C 3 slipped disc has neck pain with,  limited neck motion   Ovarian cyst    x 2now   PONV (postoperative nausea and vomiting)    Past Surgical History:  Procedure Laterality Date   CARPAL TUNNEL RELEASE Left 06/05/2014   Procedure: LEFT CARPAL TUNNEL RELEASE;  Surgeon: Daryll Brod, MD;  Location: [place];  Service: Orthopedics;  Laterality: Left;   CARPAL TUNNEL RELEASE Right 08/25/2014   Procedure: RIGHT CARPAL TUNNEL RELEASE;  Surgeon: Daryll Brod, MD;  Location: [place];  Service: Orthopedics;  Laterality: Right;   CARPAL TUNNEL RELEASE Left 07/14/2015   Procedure: LEFT CARPAL TUNNEL RELEASE;  Surgeon: Daryll Brod, MD;  Location: [place];  Service: Orthopedics;  Laterality: Left;   CERVICAL SPINE SURGERY  11/2013   dr Joya Salm   CESAREAN SECTION     x2   DILATION AND EVACUATION N/A 05/07/2013   Procedure: DILATATION AND EVACUATION with tissue sent for chromosome analysis;  Surgeon: Lovenia Kim, MD;  Location: [facility];  Service: Gynecology;  Laterality: N/A;   EPIDURAL STEROID INJECTION  03/20/15   KNEE  ARTHROSCOPY WITH MEDIAL MENISECTOMY Right 04/09/2019   Procedure: Right knee arthroscopy fat pad resection with partial medial meniscectomy;  Surgeon: Nicholes Stairs, MD;  Location[facility];  Service: Orthopedics;  Laterality: Right;  60 mins   KNEE ARTHROSCOPY WITH SUBCHONDROPLASTY Right 02/04/2020   Procedure: Right knee arthroscopy with partial medial meniscectomy with lateral tibia subchondroplasty;  Surgeon: Nicholes Stairs, MD;  Location: [facility];  Service: Orthopedics;  Laterality: Right;  75 mins   OVARIAN CYST REMOVAL     PELVIC LAPAROSCOPY  2003   endometriosis   POSTERIOR CERVICAL FUSION/FORAMINOTOMY N/A 10/28/2014   Procedure: Cervical Four-Cervical Seven Posterior cervical fusion with lateral mass fixation;  Surgeon: Eustace Moore, MD;  Location: [facility];  Service: Neurosurgery;  Laterality: N/A;  posterior   ROBOTIC ASSISTED LAPAROSCOPIC LYSIS OF ADHESION N/A 07/18/2014   Procedure: ROBOTIC ASSISTED LAPAROSCOPIC  EXCISION POSTERIOR  UTERINE WALL MASS; EXCISION RIGHT MESSALEINGEAL MASS, EXCISION AND ABLATION CULDASAC ENDOMETRIOSIS;  Surgeon: Brien Few, MD;  Location: [facility];  Service: Gynecology;  Laterality: N/A;   ROBOTIC ASSISTED TOTAL HYSTERECTOMY WITH SALPINGECTOMY Bilateral 05/11/2015   Procedure: ROBOTIC ASSISTED TOTAL HYSTERECTOMY WITH BILATERAL SALPINGECTOMY;  Surgeon: Brien Few, MD;  Location: [facility];  Service: Gynecology;  Laterality: Bilateral;   Family History  Problem Relation Age of Onset   Heart attack Father    Breast cancer Maternal Grandmother    Depression Cousin    ADD / ADHD Brother    Depression Brother    Social History   Socioeconomic History   Marital status: Divorced    Spouse name: Not on file   Number of children: 2   Years of education: 14   Highest education level: Associate degree: occupational, Hotel manager, or vocational program  Occupational History   Occupation: CMA    Employer:  [employer]    Comment: Dr.Gross    Comment: Disabled  Tobacco Use   Smoking status: Former    Packs/day: 0.50    Years: 23.00    Total pack years: 11.50    Types: Cigarettes    Quit date: 04/13/2021    Years since quitting: 0.4   Smokeless tobacco: Never  Vaping Use   Vaping Use: Never used  Substance and Sexual Activity   Alcohol use: No    Alcohol/week: 0.0 standard drinks of alcohol   Drug use: No  Sexual activity: Yes    Birth control/protection: Surgical  Other Topics Concern   Not on file  Social History Narrative   Lives with children [place] and [place].  Stays in a lot of pain all the time. She enjoys doing Haematologist and enjoys going to [employer].   Social Determinants of Health   Financial Resource Strain: Low Risk  (10/11/2021)   Overall Financial Resource Strain (CARDIA)    Difficulty of Paying Living Expenses: Not hard at all  Food Insecurity: No Food Insecurity (10/11/2021)   Hunger Vital Sign    Worried About Running Out of Food in the Last Year: Never true    Ran Out of Food in the Last Year: Never true  Transportation Needs: No Transportation Needs (10/11/2021)   PRAPARE - Hydrologist (Medical): No    Lack of Transportation (Non-Medical): No  Physical Activity: Inactive (10/11/2021)   Exercise Vital Sign    Days of Exercise per Week: 0 days    Minutes of Exercise per Session: 0 min  Stress: Stress Concern Present (10/11/2021)   [place]    Feeling of Stress : Very much  Social Connections: Socially Isolated (10/11/2021)   Social Connection and Isolation Panel [NHANES]    Frequency of Communication with Friends and Family: Twice a week    Frequency of Social Gatherings with Friends and Family: Once a week    Attends Religious Services: Never    Marine scientist or Organizations: No    Attends Archivist Meetings: Never    Marital Status:  Divorced    Activities of Daily Living    10/11/2021   10:12 AM  In your present state of health, do you have any difficulty performing the following activities:  Hearing? 0  Vision? 0  Difficulty concentrating or making decisions? 1  Comment difficulty remembering  Walking or climbing stairs? 1  Comment sometimes  Dressing or bathing? 0  Doing errands, shopping? 1  Comment for shopping- her kids have to go with her depending on the items she can't lift.  Preparing Food and eating ? N  Using the Toilet? N  In the past six months, have you accidently leaked urine? Y  Comment only stress incontinence  Do you have problems with loss of bowel control? N  Managing your Medications? N  Managing your Finances? N  Housekeeping or managing your Housekeeping? N    Patient Education/ Literacy How often do you need to have someone help you when you read instructions, pamphlets, or other written materials from your doctor or pharmacy?: 1 - Never What is the last grade level you completed in school?: 12th grade  Exercise Current Exercise Habits: The patient does not participate in regular exercise at present, Exercise limited by: orthopedic condition(s)  Diet Patient reports consuming  1.5  meals a day and 0 snack(s) a day Patient reports that her primary diet is: Regular Patient reports that she does have regular access to food.   Depression Screen    10/11/2021   10:05 AM 05/06/2021    8:56 AM 12/25/2020   10:50 AM 12/25/2020   10:48 AM 10/07/2020    2:31 PM 02/26/2020    2:12 PM 07/16/2019   10:08 AM  PHQ 2/9 Scores  PHQ - 2 Score 1 0 0 6 0 2 3  PHQ- 9 Score  0  '18  13 17     '$ Fall  Risk    10/11/2021   10:05 AM 05/06/2021    8:56 AM 12/25/2020   10:48 AM 10/07/2020    2:30 PM 02/26/2020    2:12 PM  Fall Risk   Falls in the past year? 1 0 0 1 1  Number falls in past yr: 1 0 0 1 1  Injury with Fall? 1 0 0 0 1  Risk for fall due to : History of fall(s);Impaired balance/gait No  Fall Risks  No Fall Risks History of fall(s)  Follow up Falls evaluation completed;Education provided;Falls prevention discussed Falls evaluation completed;Falls prevention discussed  Falls evaluation completed;Education provided Falls evaluation completed     Objective:  [name] seemed alert and oriented and she participated appropriately during our telephone visit.  Blood Pressure Weight BMI  BP Readings from Last 3 Encounters:  04/20/21 (!) 115/50  11/10/20 109/70  09/07/20 126/72   Wt Readings from Last 3 Encounters:  09/30/21 138 lb (62.6 kg)  05/06/21 138 lb (62.6 kg)  04/20/21 142 lb (64.4 kg)   BMI Readings from Last 1 Encounters:  09/30/21 24.45 kg/m    *Unable to obtain current vital signs, weight, and BMI due to telephone visit type  Hearing/Vision  [name] did not seem to have difficulty with hearing/understanding during the telephone conversation Reports that she has had a formal eye exam by an eye care professional within the past year Reports that she has not had a formal hearing evaluation within the past year *Unable to fully assess hearing and vision during telephone visit type  Cognitive Function:    10/11/2021   10:10 AM 10/07/2020    2:39 PM 07/16/2019   10:13 AM  6CIT Screen  What Year? 0 points 0 points 0 points  What month? 0 points 0 points 0 points  What time? 0 points 0 points 0 points  Count back from 20 0 points 0 points 0 points  Months in reverse 2 points 0 points 0 points  Repeat phrase 2 points 0 points 2 points  Total Score 4 points 0 points 2 points   (Normal:0-7, Significant for Dysfunction: >8)  Normal Cognitive Function Screening: Yes   Immunization & Health Maintenance Record Immunization History  Administered Date(s) Administered   Pneumococcal Polysaccharide-23 05/12/2015   Tdap 10/05/2017, 10/29/2020    Health Maintenance  Topic Date Due   COVID-19 Vaccine (1) 10/27/2021 (Originally 08/09/1976)   INFLUENZA  VACCINE  11/30/2021   MAMMOGRAM  11/19/2022   TETANUS/TDAP  10/30/2030   COLONOSCOPY (Pts 45-87yr Insurance coverage will need to be confirmed)  12/31/2030   Hepatitis C Screening  Completed   HIV Screening  Completed   HPV VACCINES  Aged Out       Assessment  This is a routine wellness examination for [employer]  Health Maintenance: Due or Overdue There are no preventive care reminders to display for this patient.   JAdrian Prowsdoes not need a referral for Community Assistance: Care Management:   no Social Work:    no Prescription Assistance:  no Nutrition/Diabetes Education:  no   Plan:  Personalized Goals  Goals Addressed               This Visit's Progress     Patient Stated (pt-stated)        She would like for her knee to heal up soon.       Personalized Health Maintenance & Screening Recommendations  Screening mammography - due in July, 2023  Lung Cancer Screening Recommended: no (Low Dose CT Chest recommended if Age 83-80 years, 30 pack-year currently smoking OR have quit w/in past 15 years) Hepatitis C Screening recommended: no HIV Screening recommended: no  Advanced Directives: Written information was not prepared per patient's request.  Referrals & Orders Orders Placed This Encounter  Procedures   Mammogram 3D SCREEN BREAST BILATERAL    Follow-up Plan Follow-up with Donella Stade, PA-C as planned Schedule your mammogram after 11/18/21. Medicare wellness visit in one year. Patient will access AVS on my chart.   I have personally reviewed and noted the following in the patient's chart:   Medical and social history Use of alcohol, tobacco or illicit drugs  Current medications and supplements Functional ability and status Nutritional status Physical activity Advanced directives List of other physicians Hospitalizations, surgeries, and ER visits in previous 12 months Vitals Screenings to include cognitive,  depression, and falls Referrals and appointments  In addition, I have reviewed and discussed with [name] certain preventive protocols, quality metrics, and best practice recommendations. A written personalized care plan for preventive services as well as general preventive health recommendations is available and can be mailed to the patient at her request.      Tinnie Gens, RN-BSN  10/11/2021

## 2021-10-11 NOTE — Patient Instructions (Addendum)
[place] Maintenance Summary and Written Plan of Care  [name] ,  Thank you for allowing me to perform your Medicare Annual Wellness Visit and for your ongoing commitment to your health.   Health Maintenance & Immunization History Health Maintenance  Topic Date Due  . COVID-19 Vaccine (1) 10/27/2021 (Originally 08/09/1976)  . INFLUENZA VACCINE  11/30/2021  . MAMMOGRAM  11/19/2022  . TETANUS/TDAP  10/30/2030  . COLONOSCOPY (Pts 45-40yr Insurance coverage will need to be confirmed)  12/31/2030  . Hepatitis C Screening  Completed  . HIV Screening  Completed  . HPV VACCINES  Aged Out   Immunization History  Administered Date(s) Administered  . Pneumococcal Polysaccharide-23 05/12/2015  . Tdap 10/05/2017, 10/29/2020    These are the patient goals that we discussed:  Goals Addressed              This Visit's Progress   .  Patient Stated (pt-stated)        She would like for her knee to heal up soon.        This is a list of Health Maintenance Items that are overdue or due now: There are no preventive care reminders to display for this patient.    Orders/Referrals Placed Today: Orders Placed This Encounter  Procedures  . Mammogram 3D SCREEN BREAST BILATERAL    Standing Status:   Future    Standing Expiration Date:   10/12/2022    Scheduling Instructions:     Please call patient to schedule. She is due after 11/18/21.    Order Specific Question:   Reason for Exam (SYMPTOM  OR DIAGNOSIS REQUIRED)    Answer:   Breast cancer screening    Order Specific Question:   Is the patient pregnant?    Answer:   No    Order Specific Question:   Preferred imaging location?    Answer:   [facility]   (Contact our referral department at [phone] you have not spoken with someone about your referral appointment within the next 5 days)    Follow-up Plan Follow-up with BDonella Stade PA-C as planned Schedule your mammogram  after 11/18/21. Medicare wellness visit in one year. Patient will access AVS on my chart.      Health Maintenance, Female Adopting a healthy lifestyle and getting preventive care are important in promoting health and wellness. Ask your health care provider about: The right schedule for you to have regular tests and exams. Things you can do on your own to prevent diseases and keep yourself healthy. What should I know about diet, weight, and exercise? Eat a healthy diet  Eat a diet that includes plenty of vegetables, fruits, low-fat dairy products, and lean protein. Do not eat a lot of foods that are high in solid fats, added sugars, or sodium. Maintain a healthy weight Body mass index (BMI) is used to identify weight problems. It estimates body fat based on height and weight. Your health care provider can help determine your BMI and help you achieve or maintain a healthy weight. Get regular exercise Get regular exercise. This is one of the most important things you can do for your health. Most adults should: Exercise for at least 150 minutes each week. The exercise should increase your heart rate and make you sweat (moderate-intensity exercise). Do strengthening exercises at least twice a week. This is in addition to the moderate-intensity exercise. Spend less time sitting. Even light physical activity can be beneficial. Watch  cholesterol and blood lipids Have your blood tested for lipids and cholesterol at 48 years of age, then have this test every 5 years. Have your cholesterol levels checked more often if: Your lipid or cholesterol levels are high. You are older than 48 years of age. You are at high risk for heart disease. What should I know about cancer screening? Depending on your health history and family history, you may need to have cancer screening at various ages. This may include screening for: Breast cancer. Cervical cancer. Colorectal cancer. Skin cancer. Lung  cancer. What should I know about heart disease, diabetes, and high blood pressure? Blood pressure and heart disease High blood pressure causes heart disease and increases the risk of stroke. This is more likely to develop in people who have high blood pressure readings or are overweight. Have your blood pressure checked: Every 3-5 years if you are 89-19 years of age. Every year if you are 80 years old or older. Diabetes Have regular diabetes screenings. This checks your fasting blood sugar level. Have the screening done: Once every three years after age 53 if you are at a normal weight and have a low risk for diabetes. More often and at a younger age if you are overweight or have a high risk for diabetes. What should I know about preventing infection? Hepatitis B If you have a higher risk for hepatitis B, you should be screened for this virus. Talk with your health care provider to find out if you are at risk for hepatitis B infection. Hepatitis C Testing is recommended for: Everyone born from [birth year] through 1967. Anyone with known risk factors for hepatitis C. Sexually transmitted infections (STIs) Get screened for STIs, including gonorrhea and chlamydia, if: You are sexually active and are younger than 48 years of age. You are older than 48 years of age and your health care provider tells you that you are at risk for this type of infection. Your sexual activity has changed since you were last screened, and you are at increased risk for chlamydia or gonorrhea. Ask your health care provider if you are at risk. Ask your health care provider about whether you are at high risk for HIV. Your health care provider may recommend a prescription medicine to help prevent HIV infection. If you choose to take medicine to prevent HIV, you should first get tested for HIV. You should then be tested every 3 months for as long as you are taking the medicine. Pregnancy If you are about to stop having your  period (premenopausal) and you may become pregnant, seek counseling before you get pregnant. Take 400 to 800 micrograms (mcg) of folic acid every day if you become pregnant. Ask for birth control (contraception) if you want to prevent pregnancy. Osteoporosis and menopause Osteoporosis is a disease in which the bones lose minerals and strength with aging. This can result in bone fractures. If you are 33 years old or older, or if you are at risk for osteoporosis and fractures, ask your health care provider if you should: Be screened for bone loss. Take a calcium or vitamin D supplement to lower your risk of fractures. Be given hormone replacement therapy (HRT) to treat symptoms of menopause. Follow these instructions at home: Alcohol use Do not drink alcohol if: Your health care provider tells you not to drink. You are pregnant, may be pregnant, or are planning to become pregnant. If you drink alcohol: Limit how much you have to: 0-1 drink a day.  Know how much alcohol is in your drink. In the [place] one drink equals one 12 oz bottle of beer (355 mL), one 5 oz glass of wine (148 mL), or one 1 oz glass of hard liquor (44 mL). Lifestyle Do not use any products that contain nicotine or tobacco. These products include cigarettes, chewing tobacco, and vaping devices, such as e-cigarettes. If you need help quitting, ask your health care provider. Do not use street drugs. Do not share needles. Ask your health care provider for help if you need support or information about quitting drugs. General instructions Schedule regular health, dental, and eye exams. Stay current with your vaccines. Tell your health care provider if: You often feel depressed. You have ever been abused or do not feel safe at home. Summary Adopting a healthy lifestyle and getting preventive care are important in promoting health and wellness. Follow your health care provider's instructions about healthy diet, exercising, and  getting tested or screened for diseases. Follow your health care provider's instructions on monitoring your cholesterol and blood pressure. This information is not intended to replace advice given to you by your health care provider. Make sure you discuss any questions you have with your health care provider. Document Revised: 09/07/2020 Document Reviewed: 09/07/2020 Elsevier Patient Education  [place].

## 2021-10-12 ENCOUNTER — Ambulatory Visit: Payer: Medicare Other | Attending: Neurological Surgery | Admitting: Rehabilitative and Restorative Service Providers"

## 2021-10-12 ENCOUNTER — Encounter: Payer: Self-pay | Admitting: Rehabilitative and Restorative Service Providers"

## 2021-10-12 DIAGNOSIS — M542 Cervicalgia: Secondary | ICD-10-CM | POA: Diagnosis not present

## 2021-10-12 DIAGNOSIS — R29898 Other symptoms and signs involving the musculoskeletal system: Secondary | ICD-10-CM | POA: Insufficient documentation

## 2021-10-12 DIAGNOSIS — R293 Abnormal posture: Secondary | ICD-10-CM | POA: Diagnosis not present
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
## 2021-10-13 DIAGNOSIS — Z79899 Other long term (current) drug therapy: Secondary | ICD-10-CM | POA: Diagnosis not present

## 2021-10-15 ENCOUNTER — Other Ambulatory Visit: Payer: Self-pay | Admitting: Physician Assistant

## 2021-10-15 DIAGNOSIS — I10 Essential (primary) hypertension: Secondary | ICD-10-CM

## 2021-10-18 ENCOUNTER — Ambulatory Visit (INDEPENDENT_AMBULATORY_CARE_PROVIDER_SITE_OTHER): Payer: Medicare Other | Admitting: Sports Medicine

## 2021-10-18 ENCOUNTER — Encounter: Payer: Self-pay | Admitting: Sports Medicine

## 2021-10-18 ENCOUNTER — Ambulatory Visit (INDEPENDENT_AMBULATORY_CARE_PROVIDER_SITE_OTHER): Payer: Medicare Other | Admitting: Physician Assistant

## 2021-10-18 VITALS — BP 107/53 | HR 70 | Ht 63.0 in | Wt 143.0 lb

## 2021-10-18 DIAGNOSIS — G2581 Restless legs syndrome: Secondary | ICD-10-CM

## 2021-10-18 DIAGNOSIS — M7711 Lateral epicondylitis, right elbow: Secondary | ICD-10-CM | POA: Diagnosis not present

## 2021-10-18 DIAGNOSIS — M1711 Unilateral primary osteoarthritis, right knee: Secondary | ICD-10-CM | POA: Diagnosis not present

## 2021-10-18 DIAGNOSIS — M19071 Primary osteoarthritis, right ankle and foot: Secondary | ICD-10-CM | POA: Diagnosis not present

## 2021-10-18 DIAGNOSIS — Z720 Tobacco use: Secondary | ICD-10-CM | POA: Diagnosis not present

## 2021-10-18 DIAGNOSIS — F5101 Primary insomnia: Secondary | ICD-10-CM | POA: Diagnosis not present

## 2021-10-18 DIAGNOSIS — M19042 Primary osteoarthritis, left hand: Secondary | ICD-10-CM

## 2021-10-18 DIAGNOSIS — M7989 Other specified soft tissue disorders: Secondary | ICD-10-CM | POA: Diagnosis not present

## 2021-10-18 DIAGNOSIS — I10 Essential (primary) hypertension: Secondary | ICD-10-CM

## 2021-10-18 MED ORDER — VARENICLINE TARTRATE 1 MG PO TABS: 1.0000 mg | ORAL_TABLET | Freq: Two times a day (BID) | ORAL | 2 refills | Status: DC

## 2021-10-18 MED ORDER — ROPINIROLE HCL 0.5 MG PO TABS: ORAL_TABLET | ORAL | 2 refills | Status: DC

## 2021-10-18 MED ORDER — ZOLPIDEM TARTRATE 10 MG PO TABS: 10.0000 mg | ORAL_TABLET | Freq: Every evening | ORAL | 1 refills | Status: DC | PRN

## 2021-10-18 MED ORDER — HYDROCHLOROTHIAZIDE 12.5 MG PO TABS: ORAL_TABLET | ORAL | 1 refills | Status: DC

## 2021-10-18 MED ORDER — CHANTIX STARTING MONTH PAK 0.5 MG X 11 & 1 MG X 42 PO TBPK: ORAL_TABLET | ORAL | 0 refills | Status: DC

## 2021-10-18 NOTE — Progress Notes (Unsigned)
Established Patient Office Visit  Subjective   Patient ID: ALIECE HONOLD, female    DOB: 1975/11/20  Age: 46 y.o. MRN: [id number]  Chief Complaint  Patient presents with   Follow-up    HPI  Sierra Spargo is a 46 year old female with a history of HTN, Asthma, Constipation, Myofascial Pain Syndrome, Tobacco Use Disorder, Insomnia, GAD, Restless Leg Syndrome, Migraines presents for medication management. She has been doing mostly well with her current medications regimen. She reports having difficulty sleeping about 3 nights per week. She is usually able to fall asleep with 10 mg of Ambien but reports having to use 15 mg Ambien by adding a half tablet during nights where she cannot fall asleep. Migraines are controlled with medication and dry needling. She reports that her nicotine patches continue to fall off after placement due to her active lifestyle, and that she would like to try something new to help her quit smoking. She is currently smoking a half pack daily. She is curious about what to do about itchiness secondary to oxycodone use. She has an injury to the lower right leg in which she is going to see Dr. Darene Lamer for. Denies any fevers, chills, nausea, vomiting, chest pain, claudications, cough.   Review of Systems  Skin:  Positive for itching.  All other systems reviewed and are negative.     Objective:     BP (!) 107/53   Pulse 70   Ht '5\' 3"'$  (1.6 m)   Wt 64.9 kg   LMP 05/10/2015 (LMP Unknown)   SpO2 99%   BMI 25.33 kg/m  {Vitals History (Optional):23777}  Physical Exam Vitals reviewed.  Constitutional:      General: She is not in acute distress.    Appearance: Normal appearance. She is not ill-appearing.  Eyes:     General: No scleral icterus.    Extraocular Movements: Extraocular movements intact.     Conjunctiva/sclera: Conjunctivae normal.  Cardiovascular:     Rate and Rhythm: Normal rate and regular rhythm.     Pulses: Normal pulses.      Heart sounds: Normal heart sounds.  Pulmonary:     Effort: Pulmonary effort is normal.     Breath sounds: Normal breath sounds.  Musculoskeletal:        General: Tenderness present.  Skin:    General: Skin is warm and dry.  Neurological:     Mental Status: She is alert and oriented to person, place, and time.  Psychiatric:        Mood and Affect: Mood normal.        Behavior: Behavior normal.      No results found for any visits on 10/18/21.  {Labs (Optional):23779}  The ASCVD Risk score (Arnett DK, et al., 2019) failed to calculate for the following reasons:   Cannot find a previous HDL lab   Cannot find a previous total cholesterol lab    Assessment & Plan:   Chrissy was seen today for follow-up.  Diagnoses and all orders for this visit:  RLS (restless legs syndrome) -     rOPINIRole (REQUIP) 0.5 MG tablet; Take one to two tablets twice a day.  Essential hypertension -     hydrochlorothiazide (HYDRODIURIL) 12.5 MG tablet; TAKE 1 TABLET EVERY DAY AS NEEDED for swelling.  Primary insomnia -     zolpidem (AMBIEN) 10 MG tablet; Take 1 tablet (10 mg total) by mouth at bedtime as needed for sleep.  Tobacco abuse -  Varenicline Tartrate, Starter, (CHANTIX STARTING MONTH PAK) 0.5 MG X 11 & 1 MG X 42 TBPK; Take one 0.5 mg tablet by mouth once daily for 3 days, then increase to one 0.5 mg tablet twice daily for 4 days, then increase to one 1 mg tablet twice daily. -     varenicline (CHANTIX) 1 MG tablet; Take 1 tablet (1 mg total) by mouth 2 (two) times daily.  Right leg swelling -     hydrochlorothiazide (HYDRODIURIL) 12.5 MG tablet; TAKE 1 TABLET EVERY DAY AS NEEDED for swelling.      No follow-ups on file.    Allen Norris, [place]

## 2021-10-18 NOTE — Assessment & Plan Note (Signed)
Right first MTP burning, arthritic changes, minimal on x-rays. Recommended Morton's plate, more supportive shoes. Return for injection if not sufficiently better.

## 2021-10-18 NOTE — Assessment & Plan Note (Signed)
Better with conservative treatment.

## 2021-10-18 NOTE — Patient Instructions (Signed)
Increase seroquel to '50mg'$  one and one-half tablets at bedtime Continue ambien '10mg'$  for sleep Increase requip  Start chantix for smoking cessation

## 2021-10-18 NOTE — Assessment & Plan Note (Signed)
MRI does confirm subchondral insufficiency fracture tibia, we had suggested minimize weightbearing, she does have a history of needing a lateral subchondroplasty a couple of years ago with Dr. Stann Mainland. He suggest the same, she will wait another month and if still having pain I would recommend she follow this up with Dr. Stann Mainland for subchondroplasty.

## 2021-10-18 NOTE — Assessment & Plan Note (Deleted)
Increasing pain left first MTP,

## 2021-10-18 NOTE — Progress Notes (Signed)
    Procedures performed today:    None.  Independent interpretation of notes and tests performed by another provider:   None.  Brief History, Exam, Impression, and Recommendations:    Osteoarthritis of first metatarsophalangeal (MTP) joint of right foot Right first MTP burning, arthritic changes, minimal on x-rays. Recommended Morton's plate, more supportive shoes. Return for injection if not sufficiently better.  Primary osteoarthritis of right knee with tibial subchondral insufficiency fracture MRI does confirm subchondral insufficiency fracture tibia, we had suggested minimize weightbearing, she does have a history of needing a lateral subchondroplasty a couple of years ago with Dr. Stann Mainland. He suggest the same, she will wait another month and if still having pain I would recommend she follow this up with Dr. Stann Mainland for subchondroplasty.  Lateral epicondylitis, right elbow Better with conservative treatment.    ___________________________________________ Gwen Her. Dianah Field, M.D., ABFM., CAQSM. Primary Care and [place] Instructor of [employer] [facility] [employer]

## 2021-10-19 ENCOUNTER — Encounter: Payer: Self-pay | Admitting: Physician Assistant

## 2021-10-19 ENCOUNTER — Ambulatory Visit: Payer: Medicare Other | Admitting: Rehabilitative and Restorative Service Providers"

## 2021-10-19 ENCOUNTER — Encounter: Payer: Self-pay | Admitting: Rehabilitative and Restorative Service Providers"

## 2021-10-19 DIAGNOSIS — R293 Abnormal posture: Secondary | ICD-10-CM | POA: Diagnosis not present

## 2021-10-19 DIAGNOSIS — Z6825 Body mass index (BMI) 25.0-25.9, adult: Secondary | ICD-10-CM | POA: Diagnosis not present

## 2021-10-19 DIAGNOSIS — M5416 Radiculopathy, lumbar region: Secondary | ICD-10-CM | POA: Diagnosis not present

## 2021-10-19 DIAGNOSIS — M19079 Primary osteoarthritis, unspecified ankle and foot: Secondary | ICD-10-CM | POA: Diagnosis not present

## 2021-10-19 DIAGNOSIS — M542 Cervicalgia: Secondary | ICD-10-CM | POA: Diagnosis not present

## 2021-10-19 DIAGNOSIS — R29898 Other symptoms and signs involving the musculoskeletal system: Secondary | ICD-10-CM

## 2021-10-19 DIAGNOSIS — M5010 Cervical disc disorder with radiculopathy, unspecified cervical region: Secondary | ICD-10-CM | POA: Diagnosis not present

## 2021-10-19 DIAGNOSIS — F17210 Nicotine dependence, cigarettes, uncomplicated: Secondary | ICD-10-CM | POA: Diagnosis not present

## 2021-10-19 DIAGNOSIS — M961 Postlaminectomy syndrome, not elsewhere classified: Secondary | ICD-10-CM | POA: Diagnosis not present
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
## 2021-10-21 ENCOUNTER — Encounter: Payer: Self-pay | Admitting: Physician Assistant

## 2021-10-21 ENCOUNTER — Ambulatory Visit: Payer: Medicare Other | Admitting: Rehabilitative and Restorative Service Providers"

## 2021-10-29 ENCOUNTER — Encounter: Payer: Self-pay | Admitting: Sports Medicine

## 2021-10-31 ENCOUNTER — Other Ambulatory Visit: Payer: Self-pay | Admitting: Family Medicine

## 2021-10-31 DIAGNOSIS — J4521 Mild intermittent asthma with (acute) exacerbation: Secondary | ICD-10-CM

## 2021-10-31 DIAGNOSIS — J329 Chronic sinusitis, unspecified: Secondary | ICD-10-CM

## 2021-11-01 NOTE — Telephone Encounter (Signed)
I did refill her albuterol inhale.  I do think she needs a follow-up appointment though if she is already gone through 2 inhalers and she has neb treatments available at home in the last 90 days that is a lot.  And she likely needs to be put on preventative medication.

## 2021-11-03 NOTE — Telephone Encounter (Signed)
Needs appt to discuss inhaler usage.

## 2021-11-03 NOTE — Telephone Encounter (Signed)
Yes, she needs an appt with Mersadie Kavanaugh to discuss, that's a sports med appt with T. Thanks!

## 2021-11-03 NOTE — Telephone Encounter (Signed)
Patient has an upcoming appointment on 11/11/21. Does she need a separate appointment to discuss this?

## 2021-11-03 NOTE — Telephone Encounter (Signed)
FYI

## 2021-11-03 NOTE — Telephone Encounter (Signed)
Patient stated she has not gone through 2 inhalers & has not used a nebulizer since maybe last year, she stated she only uses the inhaler 4x a month & that she accidentally hit the refill request button. AMUCK

## 2021-11-10 DIAGNOSIS — M25512 Pain in left shoulder: Secondary | ICD-10-CM | POA: Diagnosis not present

## 2021-11-11 ENCOUNTER — Ambulatory Visit (INDEPENDENT_AMBULATORY_CARE_PROVIDER_SITE_OTHER): Payer: Medicare Other | Admitting: Sports Medicine

## 2021-11-11 ENCOUNTER — Encounter: Payer: Self-pay | Admitting: Rehabilitative and Restorative Service Providers"

## 2021-11-11 ENCOUNTER — Ambulatory Visit: Payer: Medicare Other | Attending: Neurological Surgery | Admitting: Rehabilitative and Restorative Service Providers"

## 2021-11-11 DIAGNOSIS — R29898 Other symptoms and signs involving the musculoskeletal system: Secondary | ICD-10-CM | POA: Diagnosis present

## 2021-11-11 DIAGNOSIS — M1711 Unilateral primary osteoarthritis, right knee: Secondary | ICD-10-CM

## 2021-11-11 DIAGNOSIS — R293 Abnormal posture: Secondary | ICD-10-CM | POA: Insufficient documentation

## 2021-11-11 DIAGNOSIS — M542 Cervicalgia: Secondary | ICD-10-CM | POA: Diagnosis not present

## 2021-11-11 NOTE — Progress Notes (Signed)
    Procedures performed today:    None.  Independent interpretation of notes and tests performed by another provider:   None.  Brief History, Exam, Impression, and Recommendations:    Primary osteoarthritis of right knee with tibial subchondral insufficiency fracture [name] returns, she is a pleasant 46 year old female, chronic multifactorial right knee pain, she has had several injections with Dr. Stann Mainland, she has had viscosupplementation. Ultimately we obtained an MRI that did show some mild anterior tibial T2 signal, reactive versus mild subchondral insufficiency fracture. She is already post subchondroplasty with cement. Medially she had some chondral thinning. Continues to have pain, I would like her to touch base again with Dr. Stann Mainland for discussion of any other surgical options, I would also like her to discuss genicular radiofrequency ablation with Dr. Dorene Ar. I really do not think arthroplasty is warranted with her current anatomy and age. We can do a temporary handicap placard while this is getting worked up.  I spent 30 minutes of total time managing this patient today, this includes chart review, face to face, and non-face to face time.   ____________________________________________ Gwen Her. Dianah Field, M.D., ABFM., CAQSM., AME. Primary Care and Sports Medicine [facility]  Adjunct Professor of [employer] [facility] of [employer]  Risk manager

## 2021-11-11 NOTE — Assessment & Plan Note (Addendum)
[name] returns, she is a pleasant 46 year old female, chronic multifactorial right knee pain, she has had several injections with Dr. Stann Mainland, she has had viscosupplementation. Ultimately we obtained an MRI that did show some mild anterior tibial T2 signal, reactive versus mild subchondral insufficiency fracture. She is already post subchondroplasty with cement. Medially she had some chondral thinning. Continues to have pain, I would like her to touch base again with Dr. Stann Mainland for discussion of any other surgical options, I would also like her to discuss genicular radiofrequency ablation with Dr. Dorene Ar. I really do not think arthroplasty is warranted with her current anatomy and age. We can do a temporary handicap placard while this is getting worked up.
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
## 2021-11-12 ENCOUNTER — Other Ambulatory Visit: Payer: Self-pay | Admitting: Physician Assistant

## 2021-11-12 ENCOUNTER — Encounter: Payer: Self-pay | Admitting: Sports Medicine

## 2021-11-12 DIAGNOSIS — F17210 Nicotine dependence, cigarettes, uncomplicated: Secondary | ICD-10-CM | POA: Diagnosis not present

## 2021-11-12 DIAGNOSIS — Z79899 Other long term (current) drug therapy: Secondary | ICD-10-CM | POA: Diagnosis not present

## 2021-11-12 DIAGNOSIS — I10 Essential (primary) hypertension: Secondary | ICD-10-CM

## 2021-11-12 DIAGNOSIS — K5903 Drug induced constipation: Secondary | ICD-10-CM | POA: Diagnosis not present

## 2021-11-12 DIAGNOSIS — M19079 Primary osteoarthritis, unspecified ankle and foot: Secondary | ICD-10-CM | POA: Diagnosis not present

## 2021-11-12 DIAGNOSIS — M961 Postlaminectomy syndrome, not elsewhere classified: Secondary | ICD-10-CM | POA: Diagnosis not present

## 2021-11-12 DIAGNOSIS — Z9889 Other specified postprocedural states: Secondary | ICD-10-CM | POA: Diagnosis not present

## 2021-11-12 DIAGNOSIS — T402X5A Adverse effect of other opioids, initial encounter: Secondary | ICD-10-CM | POA: Diagnosis not present

## 2021-11-12 DIAGNOSIS — M7989 Other specified soft tissue disorders: Secondary | ICD-10-CM

## 2021-11-12 DIAGNOSIS — M5010 Cervical disc disorder with radiculopathy, unspecified cervical region: Secondary | ICD-10-CM | POA: Diagnosis not present

## 2021-11-12 DIAGNOSIS — Z6825 Body mass index (BMI) 25.0-25.9, adult: Secondary | ICD-10-CM | POA: Diagnosis not present

## 2021-11-12 DIAGNOSIS — M5416 Radiculopathy, lumbar region: Secondary | ICD-10-CM | POA: Diagnosis not present

## 2021-11-12 DIAGNOSIS — Z8781 Personal history of (healed) traumatic fracture: Secondary | ICD-10-CM | POA: Diagnosis not present

## 2021-11-19 ENCOUNTER — Other Ambulatory Visit: Payer: Self-pay | Admitting: Physician Assistant

## 2021-11-19 DIAGNOSIS — T887XXA Unspecified adverse effect of drug or medicament, initial encounter: Secondary | ICD-10-CM

## 2021-11-19 DIAGNOSIS — M533 Sacrococcygeal disorders, not elsewhere classified: Secondary | ICD-10-CM | POA: Diagnosis not present

## 2021-11-22 ENCOUNTER — Ambulatory Visit: Payer: Medicare Other | Admitting: Physical Therapy

## 2021-11-24 ENCOUNTER — Ambulatory Visit (INDEPENDENT_AMBULATORY_CARE_PROVIDER_SITE_OTHER): Payer: Medicare Other

## 2021-11-24 DIAGNOSIS — Z1231 Encounter for screening mammogram for malignant neoplasm of breast: Secondary | ICD-10-CM

## 2021-11-24 DIAGNOSIS — Z0000 Encounter for general adult medical examination without abnormal findings: Secondary | ICD-10-CM

## 2021-11-25 ENCOUNTER — Encounter: Payer: Self-pay | Admitting: Physical Therapy

## 2021-11-25 ENCOUNTER — Ambulatory Visit: Payer: Medicare Other | Admitting: Physical Therapy

## 2021-11-25 DIAGNOSIS — R29898 Other symptoms and signs involving the musculoskeletal system: Secondary | ICD-10-CM

## 2021-11-25 DIAGNOSIS — K5900 Constipation, unspecified: Secondary | ICD-10-CM | POA: Diagnosis not present

## 2021-11-25 DIAGNOSIS — M542 Cervicalgia: Secondary | ICD-10-CM

## 2021-11-25 DIAGNOSIS — R293 Abnormal posture: Secondary | ICD-10-CM

## 2021-11-25 DIAGNOSIS — M19012 Primary osteoarthritis, left shoulder: Secondary | ICD-10-CM | POA: Diagnosis not present

## 2021-11-25 DIAGNOSIS — M7502 Adhesive capsulitis of left shoulder: Secondary | ICD-10-CM | POA: Diagnosis not present

## 2021-11-25 DIAGNOSIS — R140 Abdominal distension (gaseous): Secondary | ICD-10-CM | POA: Diagnosis not present

## 2021-11-25 DIAGNOSIS — M25571 Pain in right ankle and joints of right foot: Secondary | ICD-10-CM | POA: Diagnosis not present

## 2021-11-25 DIAGNOSIS — S83241A Other tear of medial meniscus, current injury, right knee, initial encounter: Secondary | ICD-10-CM | POA: Diagnosis not present

## 2021-11-26 ENCOUNTER — Encounter: Payer: Self-pay | Admitting: Physician Assistant

## 2021-11-26 ENCOUNTER — Ambulatory Visit: Payer: Medicare Other | Admitting: Sports Medicine

## 2021-11-26 ENCOUNTER — Other Ambulatory Visit: Payer: Self-pay | Admitting: Physician Assistant

## 2021-11-26 DIAGNOSIS — R928 Other abnormal and inconclusive findings on diagnostic imaging of breast: Secondary | ICD-10-CM

## 2021-11-26 DIAGNOSIS — T887XXA Unspecified adverse effect of drug or medicament, initial encounter: Secondary | ICD-10-CM

## 2021-11-26 NOTE — Therapy (Addendum)
[facility] [place]  [place], [place], [address] Phone: [phone]   Fax:  [phone]  Physical Therapy Treatment and Discharge  Patient Details  Name: [name] MRN: [id number] Date of Birth: July 08, 1975 Referring Provider (PT): Dr Sherley Bounds  PHYSICAL THERAPY DISCHARGE SUMMARY  Visits from Start of Care: 7  Current functional level related to goals / functional outcomes: See below   Remaining deficits: See below   Education / Equipment: See below   Patient agrees to discharge. Patient goals were partially met. Patient is being discharged due to  surgeries and vacations planned in the oncoming months.   Encounter Date: 11/25/2021   PT End of Session - 11/25/21 1438     Visit Number 7    Number of Visits 18    Date for PT Re-Evaluation 12/01/21    PT Start Time 5597    PT Stop Time 1525    PT Time Calculation (min) 40 min    Activity Tolerance Patient tolerated treatment well             Past Medical History:  Diagnosis Date   Acute medial meniscus tear of right knee    Anxiety    Arthritis    Asthma    Complication of anesthesia    likes iv med and scopolamine patch also, limited neck motion   COVID-19 01/15/2020   antibody infusion given 01-16-2020 no symptoms since infusion   Dysrhythmia    tachycardia   Elevated heart rate and blood pressure    takes toprol for increased heart rate   Family history of adverse reaction to anesthesia    mother got blood clots after anesthesia , mother on heparin now   Fibromyalgia    GERD (gastroesophageal reflux disease)    Headache(784.0)    migraines   IC (interstitial cystitis)    Insomnia    Myofascial pain syndrome, cervical    Neck pain    C 2 and C 3 slipped disc has neck pain with, limited neck motion   Ovarian cyst    x 2now   PONV (postoperative nausea and vomiting)     Past Surgical History:  Procedure Laterality Date    CARPAL TUNNEL RELEASE Left 06/05/2014   Procedure: LEFT CARPAL TUNNEL RELEASE;  Surgeon: Daryll Brod, MD;  Location: [facility];  Service: Orthopedics;  Laterality: Left;   CARPAL TUNNEL RELEASE Right 08/25/2014   Procedure: RIGHT CARPAL TUNNEL RELEASE;  Surgeon: Daryll Brod, MD;  Location: [facility];  Service: Orthopedics;  Laterality: Right;   CARPAL TUNNEL RELEASE Left 07/14/2015   Procedure: LEFT CARPAL TUNNEL RELEASE;  Surgeon: Daryll Brod, MD;  Location: [facility];  Service: Orthopedics;  Laterality: Left;   CERVICAL SPINE SURGERY  11/2013   dr Joya Salm   CESAREAN SECTION     x2   DILATION AND EVACUATION N/A 05/07/2013   Procedure: DILATATION AND EVACUATION with tissue sent for chromosome analysis;  Surgeon: Lovenia Kim, MD;  Location: [facility];  Service: Gynecology;  Laterality: N/A;   EPIDURAL STEROID INJECTION  03/20/15   KNEE ARTHROSCOPY WITH MEDIAL MENISECTOMY Right 04/09/2019   Procedure: Right knee arthroscopy fat pad resection with partial medial meniscectomy;  Surgeon: Nicholes Stairs, MD;  Location: [facility];  Service: Orthopedics;  Laterality: Right;  60 mins   KNEE ARTHROSCOPY WITH SUBCHONDROPLASTY Right 02/04/2020   Procedure: Right knee arthroscopy with partial medial meniscectomy with lateral tibia  subchondroplasty;  Surgeon: Nicholes Stairs, MD;  Location: [facility];  Service: Orthopedics;  Laterality: Right;  75 mins   OVARIAN CYST REMOVAL     PELVIC LAPAROSCOPY  2003   endometriosis   POSTERIOR CERVICAL FUSION/FORAMINOTOMY N/A 10/28/2014   Procedure: Cervical Four-Cervical Seven Posterior cervical fusion with lateral mass fixation;  Surgeon: Eustace Moore, MD;  Location: [facility];  Service: Neurosurgery;  Laterality: N/A;  posterior   ROBOTIC ASSISTED LAPAROSCOPIC LYSIS OF ADHESION N/A 07/18/2014   Procedure: ROBOTIC ASSISTED LAPAROSCOPIC  EXCISION POSTERIOR  UTERINE WALL MASS;  EXCISION RIGHT MESSALEINGEAL MASS, EXCISION AND ABLATION CULDASAC ENDOMETRIOSIS;  Surgeon: Brien Few, MD;  Location: [facility];  Service: Gynecology;  Laterality: N/A;   ROBOTIC ASSISTED TOTAL HYSTERECTOMY WITH SALPINGECTOMY Bilateral 05/11/2015   Procedure: ROBOTIC ASSISTED TOTAL HYSTERECTOMY WITH BILATERAL SALPINGECTOMY;  Surgeon: Brien Few, MD;  Location: [facility];  Service: Gynecology;  Laterality: Bilateral;    There were no vitals filed for this visit.   Subjective Assessment - 11/25/21 1447     Subjective Pt states she followed up with doctor who states that she has a labral tear. Pt states her neck has been improving. Has been trying to stay off the foot all together. Pt is going out of town from Aug 3 to 16 with plans for the surgery. Cruise from Sept 2 to 8 and then plans for a shoulder surgery. Discussed putting PT on hold    Pertinent History several surgeries (see above); tachycardia; asthma; anxiety; myofacial pain syndrome    Currently in Pain? Yes    Pain Score 4     Pain Location Neck    Pain Orientation Right;Left;Posterior    Pain Onset More than a month ago                [facility] PT Assessment - 11/26/21 0001       Assessment   Medical Diagnosis Cervicalgia    Referring Provider (PT) Dr Sherley Bounds    Onset Date/Surgical Date 08/31/19    Hand Dominance Right    Next MD Visit PRN    Prior Therapy here for shoulder; in other clinics for knee, neck      Posture/Postural Control   Posture Comments increased head forward; shoulders rounded and elevated; head of the humerus anterior in orientation; scapulae abducted and rotated along the thoracic spine.      AROM   Cervical Flexion 60    Cervical Extension 45    Cervical - Right Side Bend 38    Cervical - Left Side Bend 32    Cervical - Right Rotation 55    Cervical - Left Rotation 55                           [facility] Adult PT Treatment/Exercise - 11/26/21 0001       Shoulder Exercises:  Prone   Retraction Strengthening    Retraction Limitations chin tuck with scap squeeze 10 sec hold x 10 reps    Other Prone Exercises "I", "W", "Y" 2x10      Shoulder Exercises: ROM/Strengthening   UBE (Upper Arm Bike) L4 x 4 min 2 min fwd. 2 min back      Shoulder Exercises: Stretch   Other Shoulder Stretches lateral neck flexion x30 sec R & L      Modalities   Modalities Electrical Stimulation      Moist Heat Therapy   Moist  Heat Location Cervical      Electrical Stimulation   Electrical Stimulation Location Bilat UTs    Electrical Stimulation Action TENS    Electrical Stimulation Parameters to pt tolerance    Electrical Stimulation Goals Pain;Tone      Manual Therapy   Manual therapy comments skilled palpation to assess response to DN and manual work    Soft tissue mobilization deep tissue work Lt > Rt posterior lateral cervical and upper trap musculature    Myofascial Release posterior cervical to upper trap bilat                          PT Long Term Goals - 11/26/21 1020       PT LONG TERM GOAL #1   Title Improve posture and alignment with patient to demonstrate improved upright posture with posterior shoulder girdle engaged    Time 6    Period Weeks    Status Partially Met    Target Date 12/01/21      PT LONG TERM GOAL #2   Title Patient to report decreased headaches/cervical pain by 50-75%    Baseline Headaches improved >75%; cervical pain 50%    Time 6    Period Weeks    Status Achieved    Target Date 12/01/21      PT LONG TERM GOAL #3   Title Increase cervical mobility/ROM by 3-7 degrees in lateral flexion and rotation    Time 6    Period Weeks    Status Partially Met    Target Date 12/01/21      PT LONG TERM GOAL #4   Title Independent in HEP    Time 6    Period Weeks    Status Achieved    Target Date 12/01/21      PT LONG TERM GOAL #5   Title Improve functional limitation score to 53    Baseline accomplished goal 10/20/22 -  score 57    Time 6    Period Weeks    Status Achieved    Target Date 12/01/21                   Plan - 11/26/21 1018     Clinical Impression Statement [name] returns after 2 weeks with continued cervical tightness. Continued to perform TPDN and manual work. Notes that headaches have greatly improved. L shoulder remains painful. Discussed d/cing PT at this point as pt has multiple surgeries and vacations planned until Sept. Pt feels she has the exercises and tools she needs to try and maintain at home. Pt has partially met her LTGs.    Rehab Potential Good    PT Frequency 2x / week    PT Duration 6 weeks    PT Treatment/Interventions ADLs/Self Care Home Management;Aquatic Therapy;Electrical Stimulation;Cryotherapy;Iontophoresis 4mg /ml Dexamethasone;Moist Heat;Ultrasound;Therapeutic activities;Therapeutic exercise;Neuromuscular re-education;Patient/family education;Manual techniques;Passive range of motion;Dry needling;Taping    PT Next Visit Plan review and progress postural correction exercises stretching pecs/anterior chest, cervical musculature as tolerated and possible postions avoiding WB Rt LE: strengthening posterior shoulder girdle musculature; DN and manual work; modalities at home(has TENS unit at home)    PT Home Exercise Plan [id number]    Consulted and Agree with Plan of Care Patient             Patient will benefit from skilled therapeutic intervention in order to improve the following deficits and impairments:     Visit Diagnosis: Cervicalgia  Other symptoms and  signs involving the musculoskeletal system  Abnormal posture     Problem List Patient Active Problem List   Diagnosis Date Noted   Osteoarthritis of first metatarsophalangeal (MTP) joint of right foot 10/18/2021   Lateral epicondylitis, right elbow 09/20/2021   Acute foot pain, right 09/20/2021   Baker's cyst of knee, right 05/21/2021   Contusion of hand, left 05/18/2021   Right leg swelling  05/18/2021   Former cigarette smoker 04/20/2021   Pneumonia of both lower lobes due to infectious organism 04/20/2021   Stress at home 12/29/2020   Injury of right knee 11/14/2020   Acute pain of right knee 11/14/2020   Numbness of right great toe 06/23/2020   Intractable right heel pain 05/04/2020   COVID-19 virus infection 01/14/2020   Impingement syndrome, shoulder, left 12/30/2019   Pes anserine bursitis 09/03/2019   Nodule right fifth PIP of hand 09/03/2019   Mood changes 08/25/2019   RLS (restless legs syndrome) 07/08/2019   Leukocytes in urine 03/04/2019   Other microscopic hematuria 03/04/2019   Left ovarian cyst 03/04/2019   Dog bite 02/15/2019   Mild episode of recurrent major depressive disorder ([place]) 10/30/2018   Traumatic ecchymosis of left foot 10/15/2018   Asthma 01/29/2018   Chronic neck pain 01/29/2018   Essential hypertension 10/06/2017   Hx of cold sores 10/06/2017   First degree ankle sprain, left, initial encounter 08/29/2017   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   Chronic pain syndrome 01/08/2017   Trigger thumb, left thumb 10/06/2016   Primary osteoarthritis of right knee with tibial subchondral insufficiency fracture 09/05/2016   Balance problems 07/19/2016   Dizziness 07/19/2016   Palpitations 04/14/2016   Primary osteoarthritis of left first metacarpophalangeal joint 04/04/2016   Fixed pupils 08/31/2015   Dysuria 08/31/2015   Depression 08/31/2015   Vasomotor flushing 06/15/2015   GAD (generalized anxiety disorder) 06/15/2015   Therapeutic opioid induced constipation 12/16/2014   S/P cervical spinal fusion 10/28/2014   Tachycardia 08/13/2014   Endometriosis 07/20/2014   Paresthesia 06/03/2014   Myofascial pain syndrome 05/29/2014   Carpal tunnel syndrome of left wrist 05/05/2014   Left shoulder pain 04/02/2014   Constipation due to opioid therapy 01/17/2014   Weight gain 11/29/2013   Greater trochanteric bursitis 11/29/2013   PTSD (post-traumatic stress  disorder) 12/14/2012   Tick-borne disease 08/09/2010   TROCHANTERIC BURSITIS 06/21/2010   Tobacco abuse 02/05/2010   INTERSTITIAL CYSTITIS 02/05/2010   FATIGUE 07/02/2009   Depressed 06/26/2008   Anxiety state 07/06/2007   Primary insomnia 07/06/2007   HEADACHE 07/06/2007    Mabrey Howland April Ma L Fyffe, PT, DPT 11/26/2021, 10:25 AM  [facility] [place] [address] [place], [place], [address] Phone: [phone]   Fax:  [phone]  Name: [name] MRN: [id number] Date of Birth: [birth year]

## 2021-11-27 ENCOUNTER — Other Ambulatory Visit: Payer: Self-pay | Admitting: Family Medicine

## 2021-11-27 DIAGNOSIS — M25571 Pain in right ankle and joints of right foot: Secondary | ICD-10-CM | POA: Diagnosis not present

## 2021-11-27 DIAGNOSIS — J4521 Mild intermittent asthma with (acute) exacerbation: Secondary | ICD-10-CM

## 2021-11-27 DIAGNOSIS — J329 Chronic sinusitis, unspecified: Secondary | ICD-10-CM

## 2021-11-27 DIAGNOSIS — M25579 Pain in unspecified ankle and joints of unspecified foot: Secondary | ICD-10-CM | POA: Diagnosis not present

## 2021-11-28 NOTE — Progress Notes (Signed)
There is some left breat asymmetry that will need more imaging. You should be contacted by imaging to schedule.

## 2021-11-29 MED ORDER — PROMETHAZINE HCL 25 MG PO TABS: 25.0000 mg | ORAL_TABLET | Freq: Three times a day (TID) | ORAL | 0 refills | Status: DC | PRN

## 2021-11-29 MED ORDER — QUETIAPINE FUMARATE 100 MG PO TABS: 100.0000 mg | ORAL_TABLET | Freq: Every day | ORAL | 1 refills | Status: DC

## 2021-11-29 NOTE — Addendum Note (Signed)
Addended by: Narda Rutherford on: 11/29/2021 03:23 PM   Modules accepted: Orders

## 2021-11-30 ENCOUNTER — Other Ambulatory Visit: Payer: Self-pay | Admitting: Sports Medicine

## 2021-11-30 ENCOUNTER — Ambulatory Visit (INDEPENDENT_AMBULATORY_CARE_PROVIDER_SITE_OTHER): Payer: Medicare Other | Admitting: Sports Medicine

## 2021-11-30 ENCOUNTER — Encounter: Payer: Self-pay | Admitting: Sports Medicine

## 2021-11-30 DIAGNOSIS — M818 Other osteoporosis without current pathological fracture: Secondary | ICD-10-CM

## 2021-11-30 DIAGNOSIS — T887XXA Unspecified adverse effect of drug or medicament, initial encounter: Secondary | ICD-10-CM

## 2021-11-30 DIAGNOSIS — F411 Generalized anxiety disorder: Secondary | ICD-10-CM | POA: Diagnosis not present

## 2021-11-30 DIAGNOSIS — Z13820 Encounter for screening for osteoporosis: Secondary | ICD-10-CM

## 2021-11-30 MED ORDER — ALPRAZOLAM 0.5 MG PO TABS: 0.5000 mg | ORAL_TABLET | Freq: Every day | ORAL | 0 refills | Status: DC | PRN

## 2021-11-30 MED ORDER — SERTRALINE HCL 25 MG PO TABS: 25.0000 mg | ORAL_TABLET | Freq: Every day | ORAL | 2 refills | Status: DC

## 2021-11-30 NOTE — Assessment & Plan Note (Signed)
Pleasant 46 year old female, has history of anxiety, not on any SSRIs. She does have some surgeries coming up, also had an abnormal mammogram and needs follow-up imaging, she is interested in something to help her stress in the meantime, as her anxiety is long-lasting and underlying we will start Zoloft 25 daily, we will also do a short course of Xanax 0.5 mg number 10/month. She will follow-up with Jade in 6 weeks for titration of the Zoloft dose.

## 2021-11-30 NOTE — Progress Notes (Signed)
    Procedures performed today:    None.  Independent interpretation of notes and tests performed by another provider:   None.  Brief History, Exam, Impression, and Recommendations:    Anxiety state Pleasant 46 year old female, has history of anxiety, not on any SSRIs. She does have some surgeries coming up, also had an abnormal mammogram and needs follow-up imaging, she is interested in something to help her stress in the meantime, as her anxiety is long-lasting and underlying we will start Zoloft 25 daily, we will also do a short course of Xanax 0.5 mg number 10/month. She will follow-up with Jade in 6 weeks for titration of the Zoloft dose.  Chronic process with exacerbation and pharmacologic intervention  ____________________________________________ Gwen Her. Dianah Field, M.D., ABFM., CAQSM., AME. Primary Care and Sports Medicine [facility]  Adjunct Professor of [employer] [employer] Medicine  Risk manager

## 2021-12-02 ENCOUNTER — Encounter: Payer: Medicare Other | Admitting: Rehabilitative and Restorative Service Providers"

## 2021-12-02 DIAGNOSIS — Z79899 Other long term (current) drug therapy: Secondary | ICD-10-CM | POA: Diagnosis not present

## 2021-12-02 DIAGNOSIS — M961 Postlaminectomy syndrome, not elsewhere classified: Secondary | ICD-10-CM | POA: Diagnosis not present

## 2021-12-02 DIAGNOSIS — M5416 Radiculopathy, lumbar region: Secondary | ICD-10-CM | POA: Diagnosis not present

## 2021-12-02 DIAGNOSIS — M25512 Pain in left shoulder: Secondary | ICD-10-CM | POA: Diagnosis not present

## 2021-12-02 DIAGNOSIS — K5903 Drug induced constipation: Secondary | ICD-10-CM | POA: Diagnosis not present

## 2021-12-02 DIAGNOSIS — M5010 Cervical disc disorder with radiculopathy, unspecified cervical region: Secondary | ICD-10-CM | POA: Diagnosis not present

## 2021-12-02 DIAGNOSIS — F17210 Nicotine dependence, cigarettes, uncomplicated: Secondary | ICD-10-CM | POA: Diagnosis not present

## 2021-12-02 DIAGNOSIS — E559 Vitamin D deficiency, unspecified: Secondary | ICD-10-CM | POA: Diagnosis not present

## 2021-12-02 DIAGNOSIS — Z9889 Other specified postprocedural states: Secondary | ICD-10-CM | POA: Diagnosis not present

## 2021-12-02 DIAGNOSIS — M25561 Pain in right knee: Secondary | ICD-10-CM | POA: Diagnosis not present

## 2021-12-02 DIAGNOSIS — T402X5A Adverse effect of other opioids, initial encounter: Secondary | ICD-10-CM | POA: Diagnosis not present

## 2021-12-02 DIAGNOSIS — Z8781 Personal history of (healed) traumatic fracture: Secondary | ICD-10-CM | POA: Diagnosis not present

## 2021-12-02 DIAGNOSIS — Z6825 Body mass index (BMI) 25.0-25.9, adult: Secondary | ICD-10-CM | POA: Diagnosis not present

## 2021-12-03 ENCOUNTER — Ambulatory Visit
Admission: RE | Admit: 2021-12-03 | Discharge: 2021-12-03 | Disposition: A | Discharge status: Home / Self Care | Payer: Medicare Other | Source: Ambulatory Visit | Attending: Physician Assistant | Admitting: Physician Assistant

## 2021-12-03 ENCOUNTER — Ambulatory Visit: Payer: Medicare Other

## 2021-12-03 DIAGNOSIS — R928 Other abnormal and inconclusive findings on diagnostic imaging of breast: Secondary | ICD-10-CM

## 2021-12-03 DIAGNOSIS — R922 Inconclusive mammogram: Secondary | ICD-10-CM | POA: Diagnosis not present

## 2021-12-06 NOTE — Progress Notes (Signed)
No suspicious findings. Screening mammogram in one year.

## 2021-12-14 ENCOUNTER — Other Ambulatory Visit: Payer: Self-pay | Admitting: Physician Assistant

## 2021-12-14 DIAGNOSIS — M7989 Other specified soft tissue disorders: Secondary | ICD-10-CM

## 2021-12-14 DIAGNOSIS — I10 Essential (primary) hypertension: Secondary | ICD-10-CM

## 2021-12-14 NOTE — Progress Notes (Signed)
Sent message, via epic in basket, requesting orders in epic from surgeon.  

## 2021-12-16 NOTE — Progress Notes (Addendum)
Anesthesia Review:  PCP: [name]  Cardiologist : none  Chest x-ray : EKG : 12/21/21  Echo : Stress test: Cardiac Cath :  Activity level: can do a flight of stairs without difficutly  Sleep Study/ CPAP : none  Fasting Blood Sugar :      / Checks Blood Sugar -- times a day:   Blood Thinner/ Instructions /Last Dose: ASA / Instructions/ Last Dose :   CBC/DIFf and CMp done 12/02/21 on chart.

## 2021-12-20 DIAGNOSIS — Z79899 Other long term (current) drug therapy: Secondary | ICD-10-CM | POA: Diagnosis not present

## 2021-12-20 DIAGNOSIS — M25512 Pain in left shoulder: Secondary | ICD-10-CM | POA: Diagnosis not present

## 2021-12-20 DIAGNOSIS — F17210 Nicotine dependence, cigarettes, uncomplicated: Secondary | ICD-10-CM | POA: Diagnosis not present

## 2021-12-20 DIAGNOSIS — Z6825 Body mass index (BMI) 25.0-25.9, adult: Secondary | ICD-10-CM | POA: Diagnosis not present

## 2021-12-20 DIAGNOSIS — M25561 Pain in right knee: Secondary | ICD-10-CM | POA: Diagnosis not present

## 2021-12-20 DIAGNOSIS — M961 Postlaminectomy syndrome, not elsewhere classified: Secondary | ICD-10-CM | POA: Diagnosis not present

## 2021-12-20 DIAGNOSIS — M5010 Cervical disc disorder with radiculopathy, unspecified cervical region: Secondary | ICD-10-CM | POA: Diagnosis not present

## 2021-12-20 DIAGNOSIS — T402X5A Adverse effect of other opioids, initial encounter: Secondary | ICD-10-CM | POA: Diagnosis not present

## 2021-12-20 DIAGNOSIS — K5903 Drug induced constipation: Secondary | ICD-10-CM | POA: Diagnosis not present

## 2021-12-20 DIAGNOSIS — Z8781 Personal history of (healed) traumatic fracture: Secondary | ICD-10-CM | POA: Diagnosis not present

## 2021-12-20 DIAGNOSIS — G8929 Other chronic pain: Secondary | ICD-10-CM | POA: Diagnosis not present

## 2021-12-20 DIAGNOSIS — M5416 Radiculopathy, lumbar region: Secondary | ICD-10-CM | POA: Diagnosis not present

## 2021-12-20 NOTE — Patient Instructions (Signed)
SURGICAL WAITING ROOM VISITATION Patients having surgery or a procedure may have no more than 2 support people in the waiting area - these visitors may rotate.   Children under the age of 35 must have an adult with them who is not the patient. If the patient needs to stay at the hospital during part of their recovery, the visitor guidelines for inpatient rooms apply. Pre-op nurse will coordinate an appropriate time for 1 support person to accompany patient in pre-op.  This support person may not rotate.    Please refer to the [facility] website for the visitor guidelines for Inpatients (after your surgery is over and you are in a regular room).       Your procedure is scheduled on:  12/31/2021    Report to [facility] Main Entrance    Report to admitting at   [phone]   Call this number if you have problems the morning of surgery [phone]   Do not eat food :After Midnight.   After Midnight you may have the following liquids until ___ 0645___ AM  DAY OF SURGERY  Water Non-Citrus Juices (without pulp, NO RED) Carbonated Beverages Black Coffee (NO MILK/CREAM OR CREAMERS, sugar ok)  Clear Tea (NO MILK/CREAM OR CREAMERS, sugar ok) regular and decaf                             Plain Jell-O (NO RED)                                           Fruit ices (not with fruit pulp, NO RED)                                     Popsicles (NO RED)                                                               Sports drinks like Gatorade (NO RED)                   The day of surgery:  Drink ONE (1) Pre-Surgery Clear Ensure or G2 at  0645 AM ( have completed by )  the morning of surgery. Drink in one sitting. Do not sip.  This drink was given to you during your hospital  pre-op appointment visit. Nothing else to drink after completing the  Pre-Surgery Clear Ensure or G2.          If you have questions, please contact your surgeon's office.   FOLLOW BOWEL PREP AND ANY ADDITIONAL PRE OP  INSTRUCTIONS YOU RECEIVED FROM YOUR SURGEON'S OFFICE!!!     Oral Hygiene is also important to reduce your risk of infection.                                    Remember - BRUSH YOUR TEETH THE MORNING OF SURGERY WITH YOUR REGULAR TOOTHPASTE   Do NOT smoke after Midnight   Take these medicines the  morning of surgery with A SIP OF WATER:  nebulizer if needed, inhalers as usual and bring, toprol if needed, protonix if needed, lyrica, zoloft   DO NOT TAKE ANY ORAL DIABETIC MEDICATIONS DAY OF YOUR SURGERY  Bring CPAP mask and tubing day of surgery.                              You may not have any metal on your body including hair pins, jewelry, and body piercing             Do not wear make-up, lotions, powders, perfumes/cologne, or deodorant  Do not wear nail polish including gel and S&S, artificial/acrylic nails, or any other type of covering on natural nails including finger and toenails. If you have artificial nails, gel coating, etc. that needs to be removed by a nail salon please have this removed prior to surgery or surgery may need to be canceled/ delayed if the surgeon/ anesthesia feels like they are unable to be safely monitored.   Do not shave  48 hours prior to surgery.               Men may shave face and neck.   Do not bring valuables to the hospital. [place].   Contacts, dentures or bridgework may not be worn into surgery.   Bring small overnight bag day of surgery.   DO NOT [place]. PHARMACY WILL DISPENSE MEDICATIONS LISTED ON YOUR MEDICATION LIST TO YOU DURING YOUR ADMISSION [place]!    Patients discharged on the day of surgery will not be allowed to drive home.  Someone NEEDS to stay with you for the first 24 hours after anesthesia.   Special Instructions: Bring a copy of your healthcare power of attorney and living will documents         the day of surgery if you haven't  scanned them before.              Please read over the following fact sheets you were given: IF YOU HAVE QUESTIONS ABOUT YOUR PRE-OP INSTRUCTIONS PLEASE CALL [phone]     [facility] - Preparing for Surgery Before surgery, you can play an important role.  Because skin is not sterile, your skin needs to be as free of germs as possible.  You can reduce the number of germs on your skin by washing with CHG (chlorahexidine gluconate) soap before surgery.  CHG is an antiseptic cleaner which kills germs and bonds with the skin to continue killing germs even after washing. Please DO NOT use if you have an allergy to CHG or antibacterial soaps.  If your skin becomes reddened/irritated stop using the CHG and inform your nurse when you arrive at Short Stay. Do not shave (including legs and underarms) for at least 48 hours prior to the first CHG shower.  You may shave your face/neck. Please follow these instructions carefully:  1.  Shower with CHG Soap the night before surgery and the  morning of Surgery.  2.  If you choose to wash your hair, wash your hair first as usual with your  normal  shampoo.  3.  After you shampoo, rinse your hair and body thoroughly to remove the  shampoo.  4.  Use CHG as you would any other liquid soap.  You can apply chg directly  to the skin and wash                       Gently with a scrungie or clean washcloth.  5.  Apply the CHG Soap to your body ONLY FROM THE NECK DOWN.   Do not use on face/ open                           Wound or open sores. Avoid contact with eyes, ears mouth and genitals (private parts).                       Wash face,  Genitals (private parts) with your normal soap.             6.  Wash thoroughly, paying special attention to the area where your surgery  will be performed.  7.  Thoroughly rinse your body with warm water from the neck down.  8.  DO NOT shower/wash with your normal soap after using and rinsing off  the CHG  Soap.                9.  Pat yourself dry with a clean towel.            10.  Wear clean pajamas.            11.  Place clean sheets on your bed the night of your first shower and do not  sleep with pets. Day of Surgery : Do not apply any lotions/deodorants the morning of surgery.  Please wear clean clothes to the hospital/surgery center.  FAILURE TO FOLLOW THESE INSTRUCTIONS MAY RESULT IN THE CANCELLATION OF YOUR SURGERY PATIENT SIGNATURE_________________________________  NURSE SIGNATURE__________________________________  ________________________________________________________________________

## 2021-12-21 ENCOUNTER — Other Ambulatory Visit: Payer: Self-pay

## 2021-12-21 ENCOUNTER — Encounter (HOSPITAL_COMMUNITY): Payer: Self-pay

## 2021-12-21 ENCOUNTER — Encounter (HOSPITAL_COMMUNITY)
Admission: RE | Admit: 2021-12-21 | Discharge: 2021-12-21 | Disposition: A | Discharge status: Home / Self Care | Payer: Medicare Other | Source: Ambulatory Visit | Attending: Orthopedic Surgery | Admitting: Orthopedic Surgery

## 2021-12-21 VITALS — BP 123/79 | Temp 98.7°F | Resp 16

## 2021-12-21 DIAGNOSIS — Z01818 Encounter for other preprocedural examination: Secondary | ICD-10-CM | POA: Diagnosis not present

## 2021-12-21 HISTORY — DX: Pneumonia, unspecified organism: J18.9

## 2021-12-21 HISTORY — DX: Depression, unspecified: F32.A

## 2021-12-22 ENCOUNTER — Other Ambulatory Visit: Payer: Medicare Other

## 2021-12-22 ENCOUNTER — Other Ambulatory Visit: Payer: Self-pay | Admitting: Medical-Surgical

## 2021-12-22 ENCOUNTER — Other Ambulatory Visit: Payer: Self-pay | Admitting: Sports Medicine

## 2021-12-22 DIAGNOSIS — F411 Generalized anxiety disorder: Secondary | ICD-10-CM

## 2021-12-22 DIAGNOSIS — M533 Sacrococcygeal disorders, not elsewhere classified: Secondary | ICD-10-CM | POA: Diagnosis not present

## 2021-12-24 DIAGNOSIS — Z79899 Other long term (current) drug therapy: Secondary | ICD-10-CM | POA: Diagnosis not present

## 2021-12-30 ENCOUNTER — Other Ambulatory Visit: Payer: Self-pay | Admitting: Family Medicine

## 2021-12-30 DIAGNOSIS — J4521 Mild intermittent asthma with (acute) exacerbation: Secondary | ICD-10-CM

## 2021-12-30 DIAGNOSIS — J329 Chronic sinusitis, unspecified: Secondary | ICD-10-CM

## 2021-12-31 ENCOUNTER — Encounter (HOSPITAL_COMMUNITY)
Admission: RE | Disposition: A | Discharge status: Home / Self Care | Payer: Self-pay | Source: Ambulatory Visit | Attending: Orthopedic Surgery

## 2021-12-31 ENCOUNTER — Other Ambulatory Visit: Payer: Self-pay

## 2021-12-31 ENCOUNTER — Ambulatory Visit (HOSPITAL_COMMUNITY): Payer: Medicare Other | Admitting: Physician Assistant

## 2021-12-31 ENCOUNTER — Ambulatory Visit (HOSPITAL_COMMUNITY): Payer: Medicare Other

## 2021-12-31 ENCOUNTER — Ambulatory Visit (HOSPITAL_BASED_OUTPATIENT_CLINIC_OR_DEPARTMENT_OTHER): Payer: Medicare Other | Admitting: Certified Registered Nurse Anesthetist

## 2021-12-31 ENCOUNTER — Ambulatory Visit (HOSPITAL_COMMUNITY)
Admission: RE | Admit: 2021-12-31 | Discharge: 2021-12-31 | Disposition: A | Discharge status: Home / Self Care | Payer: Medicare Other | Source: Ambulatory Visit | Attending: Orthopedic Surgery | Admitting: Orthopedic Surgery

## 2021-12-31 ENCOUNTER — Encounter (HOSPITAL_COMMUNITY): Payer: Self-pay | Admitting: Orthopedic Surgery

## 2021-12-31 DIAGNOSIS — M1990 Unspecified osteoarthritis, unspecified site: Secondary | ICD-10-CM | POA: Diagnosis not present

## 2021-12-31 DIAGNOSIS — I10 Essential (primary) hypertension: Secondary | ICD-10-CM | POA: Diagnosis not present

## 2021-12-31 DIAGNOSIS — R519 Headache, unspecified: Secondary | ICD-10-CM | POA: Insufficient documentation

## 2021-12-31 DIAGNOSIS — M94261 Chondromalacia, right knee: Secondary | ICD-10-CM | POA: Insufficient documentation

## 2021-12-31 DIAGNOSIS — S83241A Other tear of medial meniscus, current injury, right knee, initial encounter: Secondary | ICD-10-CM

## 2021-12-31 DIAGNOSIS — M84461A Pathological fracture, right tibia, initial encounter for fracture: Secondary | ICD-10-CM | POA: Insufficient documentation

## 2021-12-31 DIAGNOSIS — Z79891 Long term (current) use of opiate analgesic: Secondary | ICD-10-CM | POA: Diagnosis not present

## 2021-12-31 DIAGNOSIS — F418 Other specified anxiety disorders: Secondary | ICD-10-CM | POA: Diagnosis not present

## 2021-12-31 DIAGNOSIS — Z87891 Personal history of nicotine dependence: Secondary | ICD-10-CM | POA: Insufficient documentation

## 2021-12-31 DIAGNOSIS — X58XXXA Exposure to other specified factors, initial encounter: Secondary | ICD-10-CM | POA: Insufficient documentation

## 2021-12-31 DIAGNOSIS — J45909 Unspecified asthma, uncomplicated: Secondary | ICD-10-CM | POA: Insufficient documentation

## 2021-12-31 DIAGNOSIS — K219 Gastro-esophageal reflux disease without esophagitis: Secondary | ICD-10-CM | POA: Insufficient documentation

## 2021-12-31 DIAGNOSIS — M65861 Other synovitis and tenosynovitis, right lower leg: Secondary | ICD-10-CM | POA: Diagnosis not present

## 2021-12-31 DIAGNOSIS — M84361A Stress fracture, right tibia, initial encounter for fracture: Secondary | ICD-10-CM | POA: Diagnosis not present

## 2021-12-31 DIAGNOSIS — G8918 Other acute postprocedural pain: Secondary | ICD-10-CM | POA: Diagnosis not present

## 2021-12-31 DIAGNOSIS — Z01818 Encounter for other preprocedural examination: Secondary | ICD-10-CM

## 2021-12-31 DIAGNOSIS — Z9889 Other specified postprocedural states: Secondary | ICD-10-CM | POA: Diagnosis not present

## 2021-12-31 DIAGNOSIS — S83281A Other tear of lateral meniscus, current injury, right knee, initial encounter: Secondary | ICD-10-CM | POA: Diagnosis not present

## 2021-12-31 HISTORY — PX: KNEE ARTHROSCOPY WITH MEDIAL MENISECTOMY: SHX5651

## 2021-12-31 HISTORY — PX: KNEE ARTHROSCOPY WITH SUBCHONDROPLASTY: SHX6732

## 2021-12-31 SURGERY — ARTHROSCOPY, KNEE, WITH SUBCHONDROPLASTY
Anesthesia: Regional | Site: Knee | Laterality: Right

## 2021-12-31 MED ORDER — HYDROMORPHONE HCL 2 MG PO TABS: 2.0000 mg | ORAL_TABLET | ORAL | 0 refills | Status: AC | PRN

## 2021-12-31 MED ORDER — PROPOFOL 10 MG/ML IV BOLUS
INTRAVENOUS | Status: AC
  Filled 2021-12-31: qty 20

## 2021-12-31 MED ORDER — ONDANSETRON HCL 4 MG/2ML IJ SOLN
INTRAMUSCULAR | Status: AC
  Filled 2021-12-31: qty 2

## 2021-12-31 MED ORDER — OXYCODONE HCL 5 MG PO TABS
ORAL_TABLET | ORAL | Status: AC
  Filled 2021-12-31: qty 1

## 2021-12-31 MED ORDER — LACTATED RINGERS IV SOLN: INTRAVENOUS | Status: DC

## 2021-12-31 MED ORDER — EPINEPHRINE PF 1 MG/ML IJ SOLN
INTRAMUSCULAR | Status: DC | PRN
  Administered 2021-12-31: 2 mg

## 2021-12-31 MED ORDER — FENTANYL CITRATE PF 50 MCG/ML IJ SOSY
PREFILLED_SYRINGE | INTRAMUSCULAR | Status: AC
  Filled 2021-12-31: qty 3

## 2021-12-31 MED ORDER — LIDOCAINE 2% (20 MG/ML) 5 ML SYRINGE
INTRAMUSCULAR | Status: DC | PRN
  Administered 2021-12-31: 40 mg via INTRAVENOUS

## 2021-12-31 MED ORDER — EPINEPHRINE PF 1 MG/ML IJ SOLN
INTRAMUSCULAR | Status: AC
  Filled 2021-12-31: qty 2

## 2021-12-31 MED ORDER — OXYCODONE HCL 5 MG PO TABS
5.0000 mg | ORAL_TABLET | Freq: Once | ORAL | Status: AC | PRN
  Administered 2021-12-31: 5 mg via ORAL

## 2021-12-31 MED ORDER — FENTANYL CITRATE (PF) 100 MCG/2ML IJ SOLN
INTRAMUSCULAR | Status: DC | PRN
Start: 2021-12-31 — End: 2021-12-31
  Administered 2021-12-31: 25 ug via INTRAVENOUS
  Administered 2021-12-31: 50 ug via INTRAVENOUS
  Administered 2021-12-31: 25 ug via INTRAVENOUS

## 2021-12-31 MED ORDER — ORAL CARE MOUTH RINSE: 15.0000 mL | Freq: Once | OROMUCOSAL | Status: AC

## 2021-12-31 MED ORDER — DEXAMETHASONE SODIUM PHOSPHATE 10 MG/ML IJ SOLN
INTRAMUSCULAR | Status: AC
  Filled 2021-12-31: qty 1

## 2021-12-31 MED ORDER — ACETAMINOPHEN 500 MG PO TABS
1000.0000 mg | ORAL_TABLET | Freq: Once | ORAL | Status: AC
  Administered 2021-12-31: 1000 mg via ORAL
  Filled 2021-12-31: qty 2

## 2021-12-31 MED ORDER — CEFAZOLIN SODIUM-DEXTROSE 2-4 GM/100ML-% IV SOLN
2.0000 g | INTRAVENOUS | Status: AC
  Administered 2021-12-31: 2 g via INTRAVENOUS
  Filled 2021-12-31: qty 100

## 2021-12-31 MED ORDER — KETOROLAC TROMETHAMINE 30 MG/ML IJ SOLN
INTRAMUSCULAR | Status: DC | PRN
  Administered 2021-12-31: 30 mg via INTRAVENOUS

## 2021-12-31 MED ORDER — KETAMINE HCL 50 MG/5ML IJ SOSY
PREFILLED_SYRINGE | INTRAMUSCULAR | Status: AC
  Filled 2021-12-31: qty 5

## 2021-12-31 MED ORDER — CHLORHEXIDINE GLUCONATE 0.12 % MT SOLN
15.0000 mL | Freq: Once | OROMUCOSAL | Status: AC
  Administered 2021-12-31: 15 mL via OROMUCOSAL

## 2021-12-31 MED ORDER — MIDAZOLAM HCL 2 MG/2ML IJ SOLN
INTRAMUSCULAR | Status: AC
  Filled 2021-12-31: qty 2

## 2021-12-31 MED ORDER — ONDANSETRON HCL 4 MG/2ML IJ SOLN
INTRAMUSCULAR | Status: DC | PRN
  Administered 2021-12-31: 4 mg via INTRAVENOUS

## 2021-12-31 MED ORDER — FENTANYL CITRATE PF 50 MCG/ML IJ SOSY
25.0000 ug | PREFILLED_SYRINGE | INTRAMUSCULAR | Status: DC | PRN
  Administered 2021-12-31 (×3): 50 ug via INTRAVENOUS

## 2021-12-31 MED ORDER — BUPIVACAINE-EPINEPHRINE (PF) 0.5% -1:200000 IJ SOLN
INTRAMUSCULAR | Status: DC | PRN
  Administered 2021-12-31: 30 mL via PERINEURAL

## 2021-12-31 MED ORDER — SCOPOLAMINE 1 MG/3DAYS TD PT72
1.0000 | MEDICATED_PATCH | TRANSDERMAL | Status: DC
  Administered 2021-12-31: 1.5 mg via TRANSDERMAL
  Filled 2021-12-31: qty 1

## 2021-12-31 MED ORDER — MIDAZOLAM HCL 2 MG/2ML IJ SOLN
1.0000 mg | Freq: Once | INTRAMUSCULAR | Status: AC
  Administered 2021-12-31: 2 mg via INTRAVENOUS
  Filled 2021-12-31: qty 2

## 2021-12-31 MED ORDER — AMISULPRIDE (ANTIEMETIC) 5 MG/2ML IV SOLN: 10.0000 mg | Freq: Once | INTRAVENOUS | Status: DC | PRN

## 2021-12-31 MED ORDER — ONDANSETRON HCL 4 MG PO TABS: 4.0000 mg | ORAL_TABLET | Freq: Three times a day (TID) | ORAL | 0 refills | Status: DC | PRN

## 2021-12-31 MED ORDER — SODIUM CHLORIDE 0.9 % IR SOLN
Status: DC | PRN
  Administered 2021-12-31 (×2): 3000 mL

## 2021-12-31 MED ORDER — OXYCODONE HCL 5 MG/5ML PO SOLN: 5.0000 mg | Freq: Once | ORAL | Status: AC | PRN

## 2021-12-31 MED ORDER — KETOROLAC TROMETHAMINE 30 MG/ML IJ SOLN: 30.0000 mg | Freq: Once | INTRAMUSCULAR | Status: DC | PRN

## 2021-12-31 MED ORDER — KETAMINE HCL-SODIUM CHLORIDE 100-0.9 MG/10ML-% IV SOSY
PREFILLED_SYRINGE | INTRAVENOUS | Status: DC | PRN
  Administered 2021-12-31: 30 mg via INTRAVENOUS

## 2021-12-31 MED ORDER — FENTANYL CITRATE (PF) 100 MCG/2ML IJ SOLN
INTRAMUSCULAR | Status: AC
  Filled 2021-12-31: qty 2

## 2021-12-31 MED ORDER — PROPOFOL 10 MG/ML IV BOLUS
INTRAVENOUS | Status: DC | PRN
  Administered 2021-12-31: 200 mg via INTRAVENOUS

## 2021-12-31 MED ORDER — DEXAMETHASONE SODIUM PHOSPHATE 10 MG/ML IJ SOLN
INTRAMUSCULAR | Status: DC | PRN
  Administered 2021-12-31: 5 mg via INTRAVENOUS

## 2021-12-31 MED ORDER — FENTANYL CITRATE PF 50 MCG/ML IJ SOSY
50.0000 ug | PREFILLED_SYRINGE | Freq: Once | INTRAMUSCULAR | Status: AC
  Administered 2021-12-31: 100 ug via INTRAVENOUS
  Filled 2021-12-31: qty 2

## 2021-12-31 MED ORDER — PROMETHAZINE HCL 25 MG/ML IJ SOLN: 6.2500 mg | INTRAMUSCULAR | Status: DC | PRN

## 2021-12-31 MED ORDER — MIDAZOLAM HCL 5 MG/5ML IJ SOLN
INTRAMUSCULAR | Status: DC | PRN
  Administered 2021-12-31: 2 mg via INTRAVENOUS

## 2021-12-31 SURGICAL SUPPLY — 53 items
BAG COUNTER SPONGE SURGICOUNT (BAG) ×1 IMPLANT
BAG SPNG CNTER NS LX DISP (BAG) ×1
BANDAGE ESMARK 6X9 LF (GAUZE/BANDAGES/DRESSINGS) IMPLANT
BLADE SHAVER TORPEDO 4X13 (MISCELLANEOUS) ×1 IMPLANT
BNDG CMPR 9X6 STRL LF SNTH (GAUZE/BANDAGES/DRESSINGS) ×1
BNDG ELASTIC 6X5.8 VLCR STR LF (GAUZE/BANDAGES/DRESSINGS) ×1 IMPLANT
BNDG ESMARK 6X9 LF (GAUZE/BANDAGES/DRESSINGS) ×1
BURR CLEARCUT OVAL 5.5X13 (MISCELLANEOUS) IMPLANT
BURR OVAL 12 FL 5.5X13 (MISCELLANEOUS)
CAST PADDING STERILE 6X4 (CAST SUPPLIES) IMPLANT
CLSR STERI-STRIP ANTIMIC 1/2X4 (GAUZE/BANDAGES/DRESSINGS) IMPLANT
COVER MAYO STAND STRL (DRAPES) ×1 IMPLANT
COVER SURGICAL LIGHT HANDLE (MISCELLANEOUS) ×1 IMPLANT
CUFF TOURN SGL QUICK 34 (TOURNIQUET CUFF)
CUFF TRNQT CYL 34X4.125X (TOURNIQUET CUFF) IMPLANT
DRAPE ARTHROSCOPY W/POUCH 114 (DRAPES) ×1 IMPLANT
DRAPE C-ARM 42X120 X-RAY (DRAPES) ×1 IMPLANT
DRAPE SHEET LG 3/4 BI-LAMINATE (DRAPES) IMPLANT
DRAPE U-SHAPE 47X51 STRL (DRAPES) ×1 IMPLANT
DRSG PAD ABDOMINAL 8X10 ST (GAUZE/BANDAGES/DRESSINGS) IMPLANT
DURAPREP 26ML APPLICATOR (WOUND CARE) ×1 IMPLANT
GAUZE 4X4 16PLY ~~LOC~~+RFID DBL (SPONGE) ×1 IMPLANT
GAUZE PAD ABD 8X10 STRL (GAUZE/BANDAGES/DRESSINGS) ×2 IMPLANT
GAUZE SPONGE 4X4 12PLY STRL (GAUZE/BANDAGES/DRESSINGS) ×1 IMPLANT
GAUZE XEROFORM 1X8 LF (GAUZE/BANDAGES/DRESSINGS) ×1 IMPLANT
GLOVE BIO SURGEON STRL SZ7.5 (GLOVE) ×2 IMPLANT
GLOVE BIOGEL PI IND STRL 8 (GLOVE) ×2 IMPLANT
GLOVE BIOGEL PI INDICATOR 8 (GLOVE) ×2
GOWN STRL REUS W/ TWL XL LVL3 (GOWN DISPOSABLE) ×2 IMPLANT
GOWN STRL REUS W/TWL XL LVL3 (GOWN DISPOSABLE) ×2
GRAFT FILLER BONE 5ML (Knees) IMPLANT
IV NS IRRIG 3000ML ARTHROMATIC (IV SOLUTION) ×2 IMPLANT
KIT ACCUFILL 5CC (Knees) ×1 IMPLANT
KIT BASIN OR (CUSTOM PROCEDURE TRAY) ×1 IMPLANT
KIT KNEE SCP 414.502 (Knees) ×1 IMPLANT
KIT TURNOVER KIT A (KITS) IMPLANT
MANIFOLD NEPTUNE II (INSTRUMENTS) ×1 IMPLANT
PACK ARTHROSCOPY DSU (CUSTOM PROCEDURE TRAY) ×1 IMPLANT
PACK ARTHROSCOPY WL (CUSTOM PROCEDURE TRAY) ×1 IMPLANT
PORT APPOLLO RF 90DEGREE MULTI (SURGICAL WAND) IMPLANT
PROBE HOOK APOLLO (SURGICAL WAND) IMPLANT
STRIP CLOSURE SKIN 1/2X4 (GAUZE/BANDAGES/DRESSINGS) ×1 IMPLANT
SUT ETHILON 3 0 PS 1 (SUTURE) IMPLANT
SUT ETHILON 4 0 PS 2 18 (SUTURE) ×1 IMPLANT
SUT MNCRL AB 3-0 PS2 18 (SUTURE) ×1 IMPLANT
SUT MNCRL AB 3-0 PS2 27 (SUTURE) IMPLANT
SYR CONTROL 10ML LL (SYRINGE) IMPLANT
TOWEL OR 17X26 10 PK STRL BLUE (TOWEL DISPOSABLE) ×1 IMPLANT
TUBING ARTHROSCOPY IRRIG 16FT (MISCELLANEOUS) ×1 IMPLANT
TUBING CONNECTING 10 (TUBING) ×1 IMPLANT
WAND APOLLORF SJ50 AR-9845 (SURGICAL WAND) IMPLANT
WATER STERILE IRR 500ML POUR (IV SOLUTION) ×1 IMPLANT
WRAP KNEE MAXI GEL POST OP (GAUZE/BANDAGES/DRESSINGS) ×1 IMPLANT

## 2021-12-31 NOTE — Anesthesia Procedure Notes (Addendum)
Procedure Name: LMA Insertion Date/Time: 12/31/2021 10:10 AM  Performed by: West Pugh, CRNAPre-anesthesia Checklist: Patient identified, Emergency Drugs available, Suction available, Patient being monitored and Timeout performed Patient Re-evaluated:Patient Re-evaluated prior to induction Oxygen Delivery Method: Circle system utilized Preoxygenation: Pre-oxygenation with 100% oxygen Induction Type: IV induction LMA: LMA inserted Number of attempts: 1 Placement Confirmation: positive ETCO2 Tube secured with: Tape Dental Injury: Teeth and Oropharynx as per pre-operative assessment

## 2021-12-31 NOTE — H&P (Signed)
ORTHOPAEDIC H and P  REQUESTING PHYSICIAN: Nicholes Stairs, MD  PCP:  Donella Stade, PA-C  Chief Complaint: Right knee pain  HPI: [name] is a 46 y.o. female who complains of right knee pain and swelling.  She has had previous arthroscopic surgery and did really well over the last couple years.  Has had return of symptoms and now noted to have a likely medial meniscus tear as well as lateral tibial plateau stress reaction.  Here today for arthroscopic intervention.  No new complaints.  Past Medical History:  Diagnosis Date   Acute medial meniscus tear of right knee    Anxiety    Arthritis    Asthma    Complication of anesthesia    likes iv med and scopolamine patch also, limited neck motion   COVID-19 01/15/2020   antibody infusion given 01-16-2020 no symptoms since infusion   Depression    Dysrhythmia    tachycardia   Elevated heart rate and blood pressure    takes toprol for increased heart rate   Family history of adverse reaction to anesthesia    mother got blood clots after anesthesia , mother on heparin now   GERD (gastroesophageal reflux disease)    Headache(784.0)    migraines   IC (interstitial cystitis)    Insomnia    Myofascial pain syndrome, cervical    Neck pain    C 2 and C 3 slipped disc has neck pain with, limited neck motion   Ovarian cyst    x 2now   Pneumonia    PONV (postoperative nausea and vomiting)    Past Surgical History:  Procedure Laterality Date   ablation of nerves in back      CARPAL TUNNEL RELEASE Left 06/05/2014   Procedure: LEFT CARPAL TUNNEL RELEASE;  Surgeon: Daryll Brod, MD;  Location: [place];  Service: Orthopedics;  Laterality: Left;   CARPAL TUNNEL RELEASE Right 08/25/2014   Procedure: RIGHT CARPAL TUNNEL RELEASE;  Surgeon: Daryll Brod, MD;  Location: [place];  Service: Orthopedics;  Laterality: Right;   CARPAL TUNNEL RELEASE Left 07/14/2015   Procedure: LEFT CARPAL  TUNNEL RELEASE;  Surgeon: Daryll Brod, MD;  Location: [place];  Service: Orthopedics;  Laterality: Left;   CERVICAL SPINE SURGERY  11/2013   dr Joya Salm   CESAREAN SECTION     x2   DILATION AND EVACUATION N/A 05/07/2013   Procedure: DILATATION AND EVACUATION with tissue sent for chromosome analysis;  Surgeon: Lovenia Kim, MD;  Location: [facility];  Service: Gynecology;  Laterality: N/A;   EPIDURAL STEROID INJECTION  03/20/2015   KNEE ARTHROSCOPY WITH MEDIAL MENISECTOMY Right 04/09/2019   Procedure: Right knee arthroscopy fat pad resection with partial medial meniscectomy;  Surgeon: Nicholes Stairs, MD;  Location: [facility];  Service: Orthopedics;  Laterality: Right;  60 mins   KNEE ARTHROSCOPY WITH SUBCHONDROPLASTY Right 02/04/2020   Procedure: Right knee arthroscopy with partial medial meniscectomy with lateral tibia subchondroplasty;  Surgeon: Nicholes Stairs, MD;  Location: [facility];  Service: Orthopedics;  Laterality: Right;  75 mins   left bicep tendon repair      left shoulder scope surgery      OVARIAN CYST REMOVAL     PELVIC LAPAROSCOPY  2003   endometriosis   POSTERIOR CERVICAL FUSION/FORAMINOTOMY N/A 10/28/2014   Procedure: Cervical Four-Cervical Seven Posterior cervical fusion with lateral mass fixation;  Surgeon: Eustace Moore, MD;  Location: [facility];  Service: Neurosurgery;  Laterality: N/A;  posterior   ROBOTIC ASSISTED LAPAROSCOPIC LYSIS OF ADHESION N/A 07/18/2014   Procedure: ROBOTIC ASSISTED LAPAROSCOPIC  EXCISION POSTERIOR  UTERINE WALL MASS; EXCISION RIGHT MESSALEINGEAL MASS, EXCISION AND ABLATION CULDASAC ENDOMETRIOSIS;  Surgeon: Brien Few, MD;  Location: [facility];  Service: Gynecology;  Laterality: N/A;   ROBOTIC ASSISTED TOTAL HYSTERECTOMY WITH SALPINGECTOMY Bilateral 05/11/2015   Procedure: ROBOTIC ASSISTED TOTAL HYSTERECTOMY WITH BILATERAL SALPINGECTOMY;  Surgeon: Brien Few, MD;  Location: [facility];  Service: Gynecology;  Laterality: Bilateral;   Social History   Socioeconomic History   Marital status: Divorced    Spouse name: Not on file   Number of children: 2   Years of education: 14   Highest education level: Associate degree: occupational, Hotel manager, or vocational program  Occupational History   Occupation: CMA    Employer: [employer]    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  Stays in a lot of pain all the time. She enjoys doing Haematologist and enjoys going to [employer].   Social Determinants of Health   Financial Resource Strain: Low Risk  (10/11/2021)   Overall Financial Resource Strain (CARDIA)    Difficulty of Paying Living Expenses: Not hard at all  Food Insecurity: No Food Insecurity (10/11/2021)   Hunger Vital Sign    Worried About Running Out of Food in the Last Year: Never true    Ran Out of Food in the Last Year: Never true  Transportation Needs: No Transportation Needs (10/11/2021)   PRAPARE - Hydrologist (Medical): No    Lack of Transportation (Non-Medical): No  Physical Activity: Inactive (10/11/2021)   Exercise Vital Sign    Days of Exercise per Week: 0 days    Minutes of Exercise per Session: 0 min  Stress: Stress Concern Present (10/11/2021)   [place]    Feeling of Stress : Very much  Social Connections: Socially Isolated  (10/11/2021)   Social Connection and Isolation Panel [NHANES]    Frequency of Communication with Friends and Family: Twice a week    Frequency of Social Gatherings with Friends and Family: Once a week    Attends Religious Services: Never    Marine scientist or Organizations: No    Attends Music therapist: Never    Marital Status: Divorced   Family History  Problem Relation Age of Onset   Heart attack Father    Breast cancer Maternal Grandmother    Depression Cousin    ADD / ADHD Brother    Depression Brother    Allergies  Allergen Reactions   Gadolinium Derivatives Hives, Itching and Other (See Comments)    After MRI with Multihance finished, patient had redness and itching on chest, tightness  in throat.  Patient went to ED to be monitored (04/23/14).had mri since takes benadryl before, tolerates well   Iodinated Contrast Media Rash and Itching   Prednisone Other (See Comments)    Pain all over, "it triggers my myofascial pain syndrome"    Ioversol     Other reaction(s): Hives   Amitriptyline Other (See Comments)    Gained weight and does not want to be on.    Eggs Or Egg-Derived Products Rash    Childhood Reaction    Hydrocodone-Acetaminophen Itching    Tolerates with benadryl (no reaction to oxycodone)   Lunesta [Eszopiclone] Other (See Comments)    Metallic taste in mouth   Tramadol Itching   Prior to Admission medications   Medication Sig Start Date End Date Taking? Authorizing Provider  albuterol (PROVENTIL) (2.5 MG/3ML) 0.083% nebulizer solution Take 3 mLs (2.5 mg total) by nebulization every 4 (four) hours as needed for wheezing or shortness of breath (please include nebulizer machine, hoses, and mask if needed.). 09/07/20  Yes Breeback, Jade L, PA-C  albuterol (VENTOLIN HFA) 108 (90 Base) MCG/ACT inhaler INHALE 2 PUFFS BY MOUTH EVERY 6 HOURS AS NEEDED 11/01/21  Yes Hali Marry, MD  AMITIZA 24 MCG capsule Take 24 mcg by mouth 2 (two) times  daily. 12/02/21  Yes [provider]  hydrochlorothiazide (HYDRODIURIL) 12.5 MG tablet TAKE 1 TABLET EVERY DAY AS NEEDED for swelling. 10/18/21  Yes Breeback, Jade L, PA-C  methocarbamol (ROBAXIN) 750 MG tablet Take 750 mg by mouth 3 (three) times daily. 12/03/21  Yes [provider]  metoprolol succinate (TOPROL-XL) 100 MG 24 hr tablet Take 1 tablet (100 mg total) by mouth daily. Patient taking differently: Take 100 mg by mouth daily as needed (High Heart Rate). 09/07/20  Yes Breeback, Jade L, PA-C  montelukast (SINGULAIR) 10 MG tablet Take 10 mg by mouth at bedtime as needed (allergies). 06/07/21  Yes [provider]  oxyCODONE (OXY IR/ROXICODONE) 5 MG immediate release tablet Take 5 mg by mouth every 6 (six) hours as needed. 11/21/21  Yes [provider]  oxycodone (OXY-IR) 5 MG capsule Take 10 mg by mouth every 8 (eight) hours as needed for pain. 10/04/21  Yes [provider]  pantoprazole (PROTONIX) 40 MG tablet TAKE 1 TABLET BY MOUTH EVERY DAY Patient taking differently: Take 40 mg by mouth daily as needed (acid reflux). 03/02/20  Yes Breeback, Jade L, PA-C  pregabalin (LYRICA) 200 MG capsule Take 200 mg by mouth 3 (three) times daily.   Yes [provider]  promethazine (PHENERGAN) 25 MG tablet Take 1 tablet (25 mg total) by mouth every 8 (eight) hours as needed. 11/29/21  Yes Breeback, Jade L, PA-C  QUEtiapine (SEROQUEL) 100 MG tablet Take 1 tablet (100 mg total) by mouth at bedtime. 11/29/21  Yes Samuel Bouche, NP  rOPINIRole (REQUIP) 0.5 MG tablet Take one to two tablets twice a day. 10/18/21  Yes Breeback, Jade L, PA-C  sertraline (ZOLOFT) 25 MG tablet TAKE 1 TABLET (25 MG TOTAL) BY MOUTH DAILY. 12/22/21  Yes Silverio Decamp, MD  triamcinolone (KENALOG) 0.025 % cream Apply 1 Application topically 2 (two) times daily as needed (itching). 09/29/20  Yes [provider]  varenicline (CHANTIX) 1 MG tablet Take 1 tablet (1 mg total) by mouth 2  (two) times daily. 11/17/21  Yes Breeback, Jade L, PA-C  zolpidem (AMBIEN) 10 MG tablet Take 1 tablet (10 mg total) by mouth at bedtime as needed for sleep. Patient taking differently:  Take 10 mg by mouth at bedtime. 10/18/21  Yes Breeback, Jade L, PA-C  ALPRAZolam (XANAX) 0.5 MG tablet Take 1 tablet (0.5 mg total) by mouth daily as needed for anxiety. 11/30/21   Silverio Decamp, MD  linaclotide Rolan Lipa) 145 MCG CAPS capsule Take 1 capsule (145 mcg total) by mouth daily before breakfast. Patient not taking: Reported on 12/16/2021 07/16/21   Iran Planas L, PA-C  rizatriptan (MAXALT) 10 MG tablet TAKE 1 TABLET AS NEEDED FOR MIGRAINE, MAY REPEAT 2 HRS AS NEEDED 05/08/20   Breeback, Jade L, PA-C   No results found.  Positive ROS: All other systems have been reviewed and were otherwise negative with the exception of those mentioned in the HPI and as above.  Physical Exam: General: Alert, no acute distress Cardiovascular: No pedal edema Respiratory: No cyanosis, no use of accessory musculature GI: No organomegaly, abdomen is soft and non-tender Skin: No lesions in the area of chief complaint Neurologic: Sensation intact distally Psychiatric: Patient is competent for consent with normal mood and affect Lymphatic: No axillary or cervical lymphadenopathy  MUSCULOSKELETAL: Right lower extremity is warm and well-perfused with no open wounds or lesions.  Neurovascular intact throughout.  Assessment: 1.  Right knee medial meniscus tear, acute  2.  Right knee lateral tibial plateau bone marrow stress reaction/stress fracture  Plan: Plan to proceed today with arthroscopic treatment of the right knee.  We discussed arthroscopy with partial medial meniscectomy as well as lateral tibial plateau subchondroplasty to support the stress fracture.  We discussed the risk of bleeding, infection, damage to surrounding nerves and vessels, stiffness, failure of pain relief, development of arthritis, as well as  the risk of anesthesia.  We also discussed the risk of DVT which will be treated postoperatively with prophylactic aspirin.  She has provided informed consent.  -Plan will be to discharge home postoperatively from PACU.    Nicholes Stairs, MD Cell [phone]    12/31/2021 9:18 AM

## 2021-12-31 NOTE — Anesthesia Procedure Notes (Signed)
Anesthesia Regional Block: Adductor canal block   Pre-Anesthetic Checklist: , timeout performed,  Correct Patient, Correct Site, Correct Laterality,  Correct Procedure,, site marked,  Risks and benefits discussed,  Surgical consent,  Pre-op evaluation,  At surgeon's request and post-op pain management  Laterality: Right  Prep: chloraprep       Needles:  Injection technique: Single-shot  Needle Type: Echogenic Stimulator Needle     Needle Length: 9cm  Needle Gauge: 21     Additional Needles:   Procedures:,,,, ultrasound used (permanent image in chart),,    Narrative:  Start time: 12/31/2021 9:20 AM End time: 12/31/2021 9:30 AM Injection made incrementally with aspirations every 5 mL.  Performed by: Personally  Anesthesiologist: Murvin Natal, MD  Additional Notes: Functioning IV was confirmed and monitors were applied. A time-out was performed. Hand hygiene and sterile gloves were used. The thigh was placed in a frog-leg position and prepped in a sterile fashion. A 44m 21ga Arrow echogenic stimulator needle was placed using ultrasound guidance.  Negative aspiration and negative test dose prior to incremental administration of local anesthetic. The patient tolerated the procedure well.

## 2021-12-31 NOTE — Brief Op Note (Signed)
12/31/2021  10:51 AM  PATIENT:  [name]  46 y.o. female  PRE-OPERATIVE DIAGNOSIS:  Right knee medial meniscus tear, lateral plateau stress fx  POST-OPERATIVE DIAGNOSIS:  Right knee medial meniscus tear, lateral plateau stress fx  PROCEDURE:  Procedure(s) with comments: KNEE ARTHROSCOPY WITH LATERAL TIBIA SUBCHONDROPLASTY (Right) - 75 KNEE ARTHROSCOPY WITH PARTIAL  MEDIAL MENISECTOMY (Right)  SURGEON:  Surgeon(s) and Role:    * Nicholes Stairs, MD - Primary  PHYSICIAN ASSISTANT: Jonelle Sidle, PA-C  ANESTHESIA:   regional and general  EBL:  3 cc  BLOOD ADMINISTERED:none  DRAINS: none   LOCAL MEDICATIONS USED:  NONE  SPECIMEN:  No Specimen  DISPOSITION OF SPECIMEN:  N/A  COUNTS:  YES  TOURNIQUET:  * No tourniquets in log *  DICTATION: .Note written in EPIC  PLAN OF CARE: Discharge to home after PACU  PATIENT DISPOSITION:  PACU - hemodynamically stable.   Delay start of Pharmacological VTE agent (>24hrs) due to surgical blood loss or risk of bleeding: not applicable

## 2021-12-31 NOTE — Anesthesia Preprocedure Evaluation (Addendum)
Anesthesia Evaluation  Patient identified by MRN, date of birth, ID band Patient awake    Reviewed: Allergy & Precautions, NPO status , Patient's Chart, lab work & pertinent test results  History of Anesthesia Complications (+) PONV and history of anesthetic complications  Airway Mallampati: II  TM Distance: >3 FB Neck ROM: Full    Dental  (+) Partial Upper   Pulmonary asthma , former smoker,    Pulmonary exam normal        Cardiovascular hypertension, Pt. on medications and Pt. on home beta blockers Normal cardiovascular exam     Neuro/Psych  Headaches, PSYCHIATRIC DISORDERS Anxiety Depression    GI/Hepatic GERD  Medicated and Controlled,(+)     substance abuse  ,   Endo/Other  negative endocrine ROS  Renal/GU negative Renal ROS     Musculoskeletal  (+) Arthritis , narcotic dependentMyofascial pain syndrome   Abdominal   Peds  Hematology negative hematology ROS (+)   Anesthesia Other Findings Right knee medial meniscus tear Lateral plateau stress fx  Reproductive/Obstetrics                            Anesthesia Physical Anesthesia Plan  ASA: 3  Anesthesia Plan: General and Regional   Post-op Pain Management: Regional block*   Induction: Intravenous  PONV Risk Score and Plan: 4 or greater and Ondansetron, Dexamethasone, Midazolam, Scopolamine patch - Pre-op and Treatment may vary due to age or medical condition  Airway Management Planned: LMA  Additional Equipment:   Intra-op Plan:   Post-operative Plan: Extubation in OR  Informed Consent: I have reviewed the patients History and Physical, chart, labs and discussed the procedure including the risks, benefits and alternatives for the proposed anesthesia with the patient or authorized representative who has indicated his/her understanding and acceptance.     Dental advisory given  Plan Discussed with: CRNA  Anesthesia  Plan Comments:        Anesthesia Quick Evaluation

## 2021-12-31 NOTE — Transfer of Care (Signed)
Immediate Anesthesia Transfer of Care Note  Patient: LUCIANNE SMESTAD  Procedure(s) Performed: KNEE ARTHROSCOPY WITH LATERAL TIBIA SUBCHONDROPLASTY (Right: Knee) KNEE ARTHROSCOPY WITH PARTIAL  MEDIAL MENISECTOMY (Right: Knee)  Patient Location: PACU  Anesthesia Type:GA combined with regional for post-op pain  Level of Consciousness: awake, drowsy and patient cooperative  Airway & Oxygen Therapy: Patient Spontanous Breathing and Patient connected to face mask oxygen  Post-op Assessment: Report given to RN and Post -op Vital signs reviewed and stable  Post vital signs: Reviewed and stable  Last Vitals:  Vitals Value Taken Time  BP 107/73 12/31/21 1104  Temp    Pulse 104 12/31/21 1104  Resp 17 12/31/21 1106  SpO2 98 % 12/31/21 1104  Vitals shown include unvalidated device data.  Last Pain:  Vitals:   12/31/21 0935  TempSrc:   PainSc: 3          Complications: No notable events documented.

## 2021-12-31 NOTE — Op Note (Signed)
Surgeon(s): Nicholes Stairs, MD  Assistant: Jonelle Sidle, PA-C  Assistant attestation: PA Mcclung was present for the entire procedure, and participated in all portions.   ANESTHESIA:  general, and regional   FLUIDS: Per anesthesia record.    ESTIMATED BLOOD LOSS: minimal     PREOPERATIVE DIAGNOSES:  1.  Right knee medial meniscus tear, acute 2.  Right knee lateral tibial plateau stress fracture 3.   Right knee lateral tibial plateau chondromalacia   POSTOPERATIVE DIAGNOSES:  same   PROCEDURES PERFORMED:  1. Right knee arthroscopically aided treatment of lateral tibial plateau insufficiency fracture with percutaneous internal fixation (subchondroplasty)  2. Right knee arthroscopy with arthroscopic partial medial meniscectomy 3.  Chondroplasty, right knee medial femoral condyle, and lateral tibial plateau   Implant: Flowable calcium phosphate, 5 mL. Zimmer   DESCRIPTION OF PROCEDURE: The patient has a right knee medial meniscus tear. They have had pain that has been refractory to conservative management. Their preoperative MRI demonstrated subchondral bone marrow edema and insufficiency fractures of the lateral tibial plateau as well as the medial meniscus tear. Plans are to proceed with partial medial meniscectomy, internal fixation of subchondral insufficiency fractures with flowable calcium phosphate, and diagnostic arthroscopy with debridement as indicated. Full discussion held regarding risks benefits alternatives and complications related surgical intervention. Conservative care options reviewed. All questions answered.   The patient was identified in the preoperative holding area and the operative extremity was marked. The patient was brought to the operating room and transferred to operating table in a supine position. Satisfactory general anesthesia was induced by anesthesiology.     Standard anterolateral, anteromedial arthroscopy portals were obtained. The  anteromedial portal was obtained with a spinal needle for localization under direct visualization with subsequent diagnostic findings.    Anteromedial and anterolateral chambers: moderate synovitis. The synovitis was debrided with a 4.5 mm full radius shaver through both the anteromedial and lateral portals.    Suprapatellar pouch and gutters: mild synovitis or debris. Patella chondral surface: Grade 0 Trochlear chondral surface: Grade 0 Patellofemoral tracking: level Medial meniscus: posterior horn partial radial tear of about 50% at the root as well as free edge horizontal tearing the mid body and posterior horn Medial femoral condyle flexion bearing surface: Grade 0 Medial femoral condyle extension bearing surface: Grade 1 Medial tibial plateau: Grade 2 Anterior cruciate ligament:stable Posterior cruciate ligament:stable Lateral meniscus: no tear.   Lateral femoral condyle flexion bearing surface: Grade 0 Lateral femoral condyle extension bearing surface: Grade 0 Lateral tibial plateau: Grade 3   Medial meniscus tear was debrided using biters and motorized shaver alternating until a stable remnant was left. Upon completion the probe was used to evaluate and assess the remaining meniscus which was gleaned to be stable.   Chondroplasty was achieved on the medial tibial plateau as well as lateral tibial plateau using a motorized shaver to debride the grade 3 unstable cartilage. Completion of the chondroplasty left A medial femoral condyle with smooth stable surface. There was no full-thickness component noted.   Next we turned our attention to the internal fixation of the lateral tibial condyle. Arthroscopically we evaluated lateral tibial condyle noted there was no loose cartilage or debris surrounding the lesion and the fracture did not propagate to the joint surface. Using preoperative MRI we targeted the delivery device to just under the subchondral density and in the lateral tibial  plateau. This was achieved with intraoperative fluoroscopy. Once accurate placement was noted on 2 views and confirmed we delivered 5  mL of flowable calcium phosphate into the stress fracture lesion. We left the cannulas in place for approximately 8 minutes while the implant hardened. We removed the cannulas and again took 2 views of fluoroscopic pictures to confirm there was no extravasation outside of the bone. There was none noted.   After completion of synovectomy, diagnostic exam, and debridements as described, all compartments were checked and no residual debris remained. Hemostasis was achieved with the cautery wand. The portals were approximated with nylon suture. All excess fluid was expressed from the joint.  Xeroform sterile gauze dressings were applied followed by Ace bandage and ice pack.    There were no immediate competitions and all counts were correct.   DISPOSITION: The patient was awakened from general anesthetic, extubated, taken to the recovery room in medically stable condition, no apparent complications. The patient may be weightbearing as tolerated to the operative lower extremity with crutches.  Range of motion of right knee as tolerated.  They will use bid asa for DVT ppx, and return in 2 weeks for suture removal.   Nicholes Stairs

## 2021-12-31 NOTE — Anesthesia Postprocedure Evaluation (Signed)
Anesthesia Post Note  Patient: [name]  Procedure(s) Performed: KNEE ARTHROSCOPY WITH LATERAL TIBIA SUBCHONDROPLASTY (Right: Knee) KNEE ARTHROSCOPY WITH PARTIAL  MEDIAL MENISECTOMY (Right: Knee)     Patient location during evaluation: PACU Anesthesia Type: Regional and General Level of consciousness: awake Pain management: pain level controlled Vital Signs Assessment: post-procedure vital signs reviewed and stable Respiratory status: spontaneous breathing, nonlabored ventilation, respiratory function stable and patient connected to nasal cannula oxygen Cardiovascular status: blood pressure returned to baseline and stable Postop Assessment: no apparent nausea or vomiting Anesthetic complications: no   No notable events documented.  Last Vitals:  Vitals:   12/31/21 1130 12/31/21 1150  BP: 117/72 126/64  Pulse: 91 81  Resp: 17 16  Temp:    SpO2: 100% 99%    Last Pain:  Vitals:   12/31/21 1150  TempSrc:   PainSc: 5                  Avi Kerschner P Solstice Lastinger

## 2021-12-31 NOTE — Discharge Instructions (Signed)
Post-operative patient instructions  Knee Arthroscopy   Ice:  Place intermittent ice or cooler pack over your knee, 30 minutes on and 30 minutes off.  Continue this for the first 72 hours after surgery, then save ice for use after therapy sessions or on more active days.   Weight:  You may bear weight on your leg as your symptoms allow. DVT prevention: Perform ankle pumps as able throughout the day on the operative extremity.  Be mobile as possible with ambulation as able.  You should also take an 81 mg aspirin once per day x6 weeks. Crutches:  Use crutches (or walker) to assist in walking until told to discontinue by your physical therapist or physician. This will help to reduce pain. Strengthening:  Perform simple thigh squeezes (isometric quad contractions) and straight leg lifts as you are able (3 sets of 5 to 10 repetitions, 3 times a day).  For the leg lifts, have someone support under your ankle in the beginning until you have increased strength enough to do this on your own.  To help get started on thigh squeezes, place a pillow under your knee and push down on the pillow with back of knee (sometimes easier to do than with your leg fully straight). Motion:  Perform gentle knee motion as tolerated - this is gentle bending and straightening of the knee. Seated heel slides: you can start by sitting in a chair, remove your brace, and gently slide your heel back on the floor - allowing your knee to bend. Have someone help you straighten your knee (or use your other leg/foot hooked under your ankle.  Dressing:  Perform 1st dressing change at 3 days postoperative. A moderate amount of blood tinged drainage is to be expected.  So if you bleed through the dressing on the first or second day or if you have fevers, it is fine to change the dressing/check the wounds early and redress wound. Elevate your leg.  If it bleeds through again, or if the incisions are leaking frank blood, please call the office. May  change dressing every 1-2 days thereafter to help watch wounds. Can purchase Tegderm (or 76M Nexcare) water resistant dressings at local pharmacy / [facility]. Shower:  shower is ok after 3 days.  Please take shower, NO bath. Recover with gauze and ace wrap to help keep wounds protected.   Pain medication:  A narcotic pain medication has been prescribed.  Take as directed.  Typically you need narcotic pain medication more regularly during the first 3 to 5 days after surgery.  Decrease your use of the medication as the pain improves.  Narcotics can sometimes cause constipation, even after a few doses.  If you have problems with constipation, you can take an over the counter stool softener or light laxative.  If you have persistent problems, please notify your physician's office. Physical therapy: Additional activity guidelines to be provided by your physician or physical therapist at follow-up visits.  Driving: Do not recommend driving x 1-2 weeks post surgical, especially if surgery performed on right side. Should not drive while taking narcotic pain medications. It typically takes at least 2 weeks to restore sufficient neuromuscular function for normal reaction times for driving safety.  Call [phone] for questions or problems. Evenings you will be forwarded to the hospital operator.  Ask for the orthopaedic physician on call. Please call if you experience:    Redness, foul smelling, or persistent drainage from the surgical site  worsening knee pain and swelling  not responsive to medication  any calf pain and or swelling of the lower leg  temperatures greater than 101.5 F other questions or concerns   Thank you for allowing [place] to be a part of your care

## 2022-01-04 ENCOUNTER — Encounter (HOSPITAL_COMMUNITY): Payer: Self-pay | Admitting: Orthopedic Surgery

## 2022-01-11 ENCOUNTER — Telehealth (INDEPENDENT_AMBULATORY_CARE_PROVIDER_SITE_OTHER): Payer: Medicare Other | Admitting: Physician Assistant

## 2022-01-11 ENCOUNTER — Encounter: Payer: Self-pay | Admitting: Physician Assistant

## 2022-01-11 VITALS — Ht 63.0 in | Wt 140.0 lb

## 2022-01-11 DIAGNOSIS — M7918 Myalgia, other site: Secondary | ICD-10-CM

## 2022-01-11 DIAGNOSIS — F330 Major depressive disorder, recurrent, mild: Secondary | ICD-10-CM | POA: Diagnosis not present

## 2022-01-11 DIAGNOSIS — T753XXD Motion sickness, subsequent encounter: Secondary | ICD-10-CM

## 2022-01-11 DIAGNOSIS — T753XXA Motion sickness, initial encounter: Secondary | ICD-10-CM | POA: Insufficient documentation

## 2022-01-11 DIAGNOSIS — R4586 Emotional lability: Secondary | ICD-10-CM | POA: Diagnosis not present

## 2022-01-11 MED ORDER — SCOPOLAMINE 1 MG/3DAYS TD PT72: 1.0000 | MEDICATED_PATCH | TRANSDERMAL | 0 refills | Status: DC

## 2022-01-11 MED ORDER — METHOCARBAMOL 750 MG PO TABS: 750.0000 mg | ORAL_TABLET | Freq: Three times a day (TID) | ORAL | 2 refills | Status: DC

## 2022-01-11 MED ORDER — SERTRALINE HCL 50 MG PO TABS: 50.0000 mg | ORAL_TABLET | Freq: Every day | ORAL | 1 refills | Status: DC

## 2022-01-11 MED ORDER — QUETIAPINE FUMARATE 100 MG PO TABS: 100.0000 mg | ORAL_TABLET | Freq: Every day | ORAL | 1 refills | Status: DC

## 2022-01-11 NOTE — Progress Notes (Signed)
..Virtual Visit via Video Note  I connected with [name] on 01/11/22 at 10:30 AM EDT by a video enabled telemedicine application and verified that I am speaking with the correct person using two identifiers.  Location: Patient: home Provider: clinic  .Marland KitchenParticipating in visit:  Patient: [name] Provider: Iran Planas PA-C   I discussed the limitations of evaluation and management by telemedicine and the availability of in person appointments. The patient expressed understanding and agreed to proceed.  History of Present Illness: Patient is a 46 year old female with MDD and mood changes who calls into the clinic for follow-up and medication refills.  Zoloft was started about 6 weeks ago and she has noticed a significant improvement in her depression and anxiety symptoms.  She feels like she is able to handle more of her day-to-day situations.  She still has some breakthrough irritability and down this but it is much more manageable.  She denies any suicidal thoughts or homicidal idealizations.  She did just have knee surgery and she is recouping from this.  She is being managed by her surgeon for pain control.  She has not seen in the pain clinic at this time.  She does need a refill of her Robaxin to help relax her muscles.  Patient is going on a cruise in October and would like some motion sickness patches.   .. Active Ambulatory Problems    Diagnosis Date Noted   Anxiety state 07/06/2007   Tobacco abuse 02/05/2010   Depressed 06/26/2008   INTERSTITIAL CYSTITIS 02/05/2010   Primary insomnia 07/06/2007   FATIGUE 07/02/2009   HEADACHE 07/06/2007   TROCHANTERIC BURSITIS 06/21/2010   Tick-borne disease 08/09/2010   PTSD (post-traumatic stress disorder) 12/14/2012   Weight gain 11/29/2013   Greater trochanteric bursitis 11/29/2013   Constipation due to opioid therapy 01/17/2014   Left shoulder pain 04/02/2014   Carpal tunnel syndrome of left wrist 05/05/2014    Myofascial pain syndrome 05/29/2014   Paresthesia 06/03/2014   Endometriosis 07/20/2014   Tachycardia 08/13/2014   S/P cervical spinal fusion 10/28/2014   Therapeutic opioid induced constipation 12/16/2014   Vasomotor flushing 06/15/2015   GAD (generalized anxiety disorder) 06/15/2015   Fixed pupils 08/31/2015   Dysuria 08/31/2015   Depression 08/31/2015   Primary osteoarthritis of left first metacarpophalangeal joint 04/04/2016   Palpitations 04/14/2016   Balance problems 07/19/2016   Dizziness 07/19/2016   Primary osteoarthritis of right knee with tibial subchondral insufficiency fracture 09/05/2016   Trigger thumb, left thumb 10/06/2016   Chronic pain syndrome 01/08/2017   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   First degree ankle sprain, left, initial encounter 08/29/2017   Essential hypertension 10/06/2017   Hx of cold sores 10/06/2017   Asthma 01/29/2018   Chronic neck pain 01/29/2018   Traumatic ecchymosis of left foot 10/15/2018   Mild episode of recurrent major depressive disorder ([place]) 10/30/2018   Dog bite 02/15/2019   Leukocytes in urine 03/04/2019   Other microscopic hematuria 03/04/2019   Left ovarian cyst 03/04/2019   RLS (restless legs syndrome) 07/08/2019   Mood changes 08/25/2019   Pes anserine bursitis 09/03/2019   Nodule right fifth PIP of hand 09/03/2019   Impingement syndrome, shoulder, left 12/30/2019   COVID-19 virus infection 01/14/2020   Intractable right heel pain 05/04/2020   Numbness of right great toe 06/23/2020   Injury of right knee 11/14/2020   Acute pain of right knee 11/14/2020   Stress at home 12/29/2020   Former cigarette smoker 04/20/2021   Pneumonia of both  lower lobes due to infectious organism 04/20/2021   Contusion of hand, left 05/18/2021   Right leg swelling 05/18/2021   Baker's cyst of knee, right 05/21/2021   Lateral epicondylitis, right elbow 09/20/2021   Acute foot pain, right 09/20/2021   Osteoarthritis of first metatarsophalangeal  (MTP) joint of right foot 10/18/2021   Motion sickness 01/11/2022   Resolved Ambulatory Problems    Diagnosis Date Noted   RHINITIS 10/13/2009   Polyuria 11/04/2010   Degenerative disc disease, cervical 10/31/2013   Cervical disc disorder with radiculopathy of cervical region 11/29/2013   Itching 11/29/2013   Radiculitis of left cervical region 01/17/2014   Nausea with vomiting 11/25/2014   Dysmenorrhea 05/11/2015   Cervical disc disorder with radiculopathy of cervical region 11/11/2015   Acute upper respiratory infection 04/04/2016   Localized primary carpometacarpal osteoarthritis, left 04/04/2016   Cervical post-laminectomy syndrome 07/08/2019   Past Medical History:  Diagnosis Date   Acute medial meniscus tear of right knee    Anxiety    Arthritis    Complication of anesthesia    COVID-19 01/15/2020   Dysrhythmia    Elevated heart rate and blood pressure    Family history of adverse reaction to anesthesia    GERD (gastroesophageal reflux disease)    IC (interstitial cystitis)    Insomnia    Myofascial pain syndrome, cervical    Neck pain    Ovarian cyst    Pneumonia    PONV (postoperative nausea and vomiting)     Observations/Objective: No acute distress Normal breathing Normal mood and appearance  ..    01/11/2022   10:07 AM 10/11/2021   10:05 AM 05/06/2021    8:56 AM 12/25/2020   10:50 AM 12/25/2020   10:48 AM  Depression screen PHQ 2/9  Decreased Interest 1 0 0 0 3  Down, Depressed, Hopeless 0 1 0 0 3  PHQ - 2 Score 1 1 0 0 6  Altered sleeping 2  0  3  Tired, decreased energy 3  0  3  Change in appetite 0  0  3  Feeling bad or failure about yourself  0  0  0  Trouble concentrating 2  0  0  Moving slowly or fidgety/restless 0  0  3  Suicidal thoughts 0  0  0  PHQ-9 Score 8  0  18  Difficult doing work/chores Somewhat difficult  Not difficult at all     ..    01/11/2022   10:09 AM 12/25/2020   10:50 AM 02/26/2020    2:13 PM 07/08/2019    9:15 AM  GAD  7 : Generalized Anxiety Score  Nervous, Anxious, on Edge '1 3 2 3  '$ Control/stop worrying '1 3 2 3  '$ Worry too much - different things '3 3 3 3  '$ Trouble relaxing '1 3 3 3  '$ Restless '3 3 3 3  '$ Easily annoyed or irritable '1 3 1 3  '$ Afraid - awful might happen 0 '3 3 3  '$ Total GAD 7 Score '10 21 17 21  '$ Anxiety Difficulty Not difficult at all Not difficult at all         Assessment and Plan: .Marland KitchenTacori was seen today for follow-up, anxiety and depression.  Diagnoses and all orders for this visit:  Mild episode of recurrent major depressive disorder (HCC) -     sertraline (ZOLOFT) 50 MG tablet; Take 1 tablet (50 mg total) by mouth daily.  Motion sickness, subsequent encounter -     scopolamine (  TRANSDERM-SCOP) 1 MG/3DAYS; Place 1 patch (1.5 mg total) onto the skin every 3 (three) days.  Myofascial pain syndrome -     methocarbamol (ROBAXIN) 750 MG tablet; Take 1 tablet (750 mg total) by mouth 3 (three) times daily.  Mood changes -     QUEtiapine (SEROQUEL) 100 MG tablet; Take 1 tablet (100 mg total) by mouth at bedtime.   PHQ GAD numbers were much improved but not quite to goal Patient agreed and we decided to increase Zoloft to 50 mg daily. Continue Seroquel at bedtime. Follow-up in 3 months.  I did go ahead and refill her Robaxin for her overall myofascial pain.  She has not seen the pain clinic right now because she is in the care of her surgeon for pain medication.  Patches sent for motion sickness to use on her cruise.   Follow Up Instructions:    I discussed the assessment and treatment plan with the patient. The patient was provided an opportunity to ask questions and all were answered. The patient agreed with the plan and demonstrated an understanding of the instructions.   The patient was advised to call back or seek an in-person evaluation if the symptoms worsen or if the condition fails to improve as anticipated.     Iran Planas, PA-C

## 2022-01-11 NOTE — Progress Notes (Signed)
Wants refill Methocarbamol - usually written by pain management but not seeing them right now due to knee surgery  Also wants RX for nausea patches for planned cruise in October   PHQ9 (8) -GAD7 (10) completed. Thinks Zoloft is working well for her.

## 2022-01-15 ENCOUNTER — Other Ambulatory Visit: Payer: Self-pay | Admitting: Physician Assistant

## 2022-01-15 DIAGNOSIS — G2581 Restless legs syndrome: Secondary | ICD-10-CM

## 2022-01-21 ENCOUNTER — Encounter: Payer: Self-pay | Admitting: Physician Assistant

## 2022-01-21 DIAGNOSIS — K219 Gastro-esophageal reflux disease without esophagitis: Secondary | ICD-10-CM

## 2022-01-22 ENCOUNTER — Other Ambulatory Visit: Payer: Self-pay | Admitting: Physician Assistant

## 2022-01-22 DIAGNOSIS — Z720 Tobacco use: Secondary | ICD-10-CM

## 2022-01-24 MED ORDER — PANTOPRAZOLE SODIUM 40 MG PO TBEC: 40.0000 mg | DELAYED_RELEASE_TABLET | Freq: Every day | ORAL | 1 refills | Status: DC

## 2022-01-31 ENCOUNTER — Other Ambulatory Visit: Payer: Self-pay | Admitting: Physician Assistant

## 2022-01-31 DIAGNOSIS — R4586 Emotional lability: Secondary | ICD-10-CM

## 2022-01-31 DIAGNOSIS — F411 Generalized anxiety disorder: Secondary | ICD-10-CM

## 2022-01-31 DIAGNOSIS — F330 Major depressive disorder, recurrent, mild: Secondary | ICD-10-CM

## 2022-02-09 DIAGNOSIS — M7582 Other shoulder lesions, left shoulder: Secondary | ICD-10-CM | POA: Diagnosis not present

## 2022-02-09 DIAGNOSIS — M65812 Other synovitis and tenosynovitis, left shoulder: Secondary | ICD-10-CM | POA: Diagnosis not present

## 2022-02-09 DIAGNOSIS — M24112 Other articular cartilage disorders, left shoulder: Secondary | ICD-10-CM | POA: Diagnosis not present

## 2022-02-09 DIAGNOSIS — S43432A Superior glenoid labrum lesion of left shoulder, initial encounter: Secondary | ICD-10-CM | POA: Diagnosis not present

## 2022-02-09 DIAGNOSIS — M19012 Primary osteoarthritis, left shoulder: Secondary | ICD-10-CM | POA: Diagnosis not present

## 2022-02-09 DIAGNOSIS — G8918 Other acute postprocedural pain: Secondary | ICD-10-CM | POA: Diagnosis not present

## 2022-02-09 DIAGNOSIS — M7542 Impingement syndrome of left shoulder: Secondary | ICD-10-CM | POA: Diagnosis not present

## 2022-02-09 DIAGNOSIS — M7552 Bursitis of left shoulder: Secondary | ICD-10-CM | POA: Diagnosis not present

## 2022-02-09 DIAGNOSIS — M7502 Adhesive capsulitis of left shoulder: Secondary | ICD-10-CM | POA: Diagnosis not present

## 2022-02-15 DIAGNOSIS — G894 Chronic pain syndrome: Secondary | ICD-10-CM | POA: Diagnosis not present

## 2022-02-15 DIAGNOSIS — M5412 Radiculopathy, cervical region: Secondary | ICD-10-CM | POA: Diagnosis not present

## 2022-02-15 DIAGNOSIS — M533 Sacrococcygeal disorders, not elsewhere classified: Secondary | ICD-10-CM | POA: Diagnosis not present

## 2022-02-22 DIAGNOSIS — M5451 Vertebrogenic low back pain: Secondary | ICD-10-CM | POA: Diagnosis not present

## 2022-03-01 DIAGNOSIS — Z4789 Encounter for other orthopedic aftercare: Secondary | ICD-10-CM | POA: Diagnosis not present

## 2022-03-02 ENCOUNTER — Other Ambulatory Visit: Payer: Self-pay | Admitting: Physician Assistant

## 2022-03-02 DIAGNOSIS — Z720 Tobacco use: Secondary | ICD-10-CM

## 2022-03-02 NOTE — Telephone Encounter (Signed)
Last written for 3 months in July. Okay to keep filling?

## 2022-03-02 NOTE — Telephone Encounter (Signed)
Please contact patient for an appt.

## 2022-03-03 NOTE — Telephone Encounter (Signed)
Left voice message for the patient to call back to schedule an appointment and it can be virtual. tvt

## 2022-03-10 DIAGNOSIS — M533 Sacrococcygeal disorders, not elsewhere classified: Secondary | ICD-10-CM | POA: Diagnosis not present

## 2022-03-11 ENCOUNTER — Other Ambulatory Visit: Payer: Self-pay | Admitting: Physician Assistant

## 2022-03-11 DIAGNOSIS — F411 Generalized anxiety disorder: Secondary | ICD-10-CM

## 2022-03-11 DIAGNOSIS — F330 Major depressive disorder, recurrent, mild: Secondary | ICD-10-CM

## 2022-03-11 DIAGNOSIS — R4586 Emotional lability: Secondary | ICD-10-CM

## 2022-03-22 DIAGNOSIS — M533 Sacrococcygeal disorders, not elsewhere classified: Secondary | ICD-10-CM | POA: Diagnosis not present

## 2022-03-22 DIAGNOSIS — M5451 Vertebrogenic low back pain: Secondary | ICD-10-CM | POA: Diagnosis not present

## 2022-03-30 ENCOUNTER — Other Ambulatory Visit: Payer: Self-pay | Admitting: Physician Assistant

## 2022-03-30 DIAGNOSIS — M7918 Myalgia, other site: Secondary | ICD-10-CM

## 2022-03-30 DIAGNOSIS — M533 Sacrococcygeal disorders, not elsewhere classified: Secondary | ICD-10-CM | POA: Diagnosis not present

## 2022-03-31 DIAGNOSIS — M7051 Other bursitis of knee, right knee: Secondary | ICD-10-CM | POA: Diagnosis not present

## 2022-03-31 DIAGNOSIS — Z4789 Encounter for other orthopedic aftercare: Secondary | ICD-10-CM | POA: Diagnosis not present

## 2022-04-01 ENCOUNTER — Other Ambulatory Visit: Payer: Self-pay | Admitting: Family Medicine

## 2022-04-01 DIAGNOSIS — J4521 Mild intermittent asthma with (acute) exacerbation: Secondary | ICD-10-CM

## 2022-04-01 DIAGNOSIS — J329 Chronic sinusitis, unspecified: Secondary | ICD-10-CM

## 2022-04-06 DIAGNOSIS — M5451 Vertebrogenic low back pain: Secondary | ICD-10-CM | POA: Diagnosis not present

## 2022-04-06 DIAGNOSIS — M533 Sacrococcygeal disorders, not elsewhere classified: Secondary | ICD-10-CM | POA: Diagnosis not present

## 2022-04-09 ENCOUNTER — Other Ambulatory Visit: Payer: Self-pay | Admitting: Physician Assistant

## 2022-04-09 DIAGNOSIS — F330 Major depressive disorder, recurrent, mild: Secondary | ICD-10-CM

## 2022-04-09 DIAGNOSIS — F411 Generalized anxiety disorder: Secondary | ICD-10-CM

## 2022-04-09 DIAGNOSIS — R4586 Emotional lability: Secondary | ICD-10-CM

## 2022-04-14 DIAGNOSIS — M25561 Pain in right knee: Secondary | ICD-10-CM | POA: Diagnosis not present

## 2022-04-18 DIAGNOSIS — M5451 Vertebrogenic low back pain: Secondary | ICD-10-CM | POA: Diagnosis not present

## 2022-04-18 DIAGNOSIS — M533 Sacrococcygeal disorders, not elsewhere classified: Secondary | ICD-10-CM | POA: Diagnosis not present

## 2022-04-23 ENCOUNTER — Other Ambulatory Visit: Payer: Self-pay | Admitting: Physician Assistant

## 2022-04-23 DIAGNOSIS — R059 Cough, unspecified: Secondary | ICD-10-CM | POA: Diagnosis not present

## 2022-04-23 DIAGNOSIS — Z6825 Body mass index (BMI) 25.0-25.9, adult: Secondary | ICD-10-CM | POA: Diagnosis not present

## 2022-04-23 DIAGNOSIS — M5010 Cervical disc disorder with radiculopathy, unspecified cervical region: Secondary | ICD-10-CM | POA: Diagnosis not present

## 2022-04-23 DIAGNOSIS — F17210 Nicotine dependence, cigarettes, uncomplicated: Secondary | ICD-10-CM | POA: Diagnosis not present

## 2022-04-23 DIAGNOSIS — Z79899 Other long term (current) drug therapy: Secondary | ICD-10-CM | POA: Diagnosis not present

## 2022-04-23 DIAGNOSIS — T402X5A Adverse effect of other opioids, initial encounter: Secondary | ICD-10-CM | POA: Diagnosis not present

## 2022-04-23 DIAGNOSIS — K5903 Drug induced constipation: Secondary | ICD-10-CM | POA: Diagnosis not present

## 2022-04-23 DIAGNOSIS — M5416 Radiculopathy, lumbar region: Secondary | ICD-10-CM | POA: Diagnosis not present

## 2022-04-23 DIAGNOSIS — F5101 Primary insomnia: Secondary | ICD-10-CM

## 2022-04-23 DIAGNOSIS — M961 Postlaminectomy syndrome, not elsewhere classified: Secondary | ICD-10-CM | POA: Diagnosis not present

## 2022-04-23 DIAGNOSIS — Z8781 Personal history of (healed) traumatic fracture: Secondary | ICD-10-CM | POA: Diagnosis not present

## 2022-04-23 DIAGNOSIS — Z9889 Other specified postprocedural states: Secondary | ICD-10-CM | POA: Diagnosis not present

## 2022-04-23 DIAGNOSIS — M25561 Pain in right knee: Secondary | ICD-10-CM | POA: Diagnosis not present

## 2022-04-26 ENCOUNTER — Ambulatory Visit (INDEPENDENT_AMBULATORY_CARE_PROVIDER_SITE_OTHER): Payer: Medicare Other | Admitting: Sports Medicine

## 2022-04-26 ENCOUNTER — Ambulatory Visit (INDEPENDENT_AMBULATORY_CARE_PROVIDER_SITE_OTHER): Payer: Medicare Other

## 2022-04-26 DIAGNOSIS — M25561 Pain in right knee: Secondary | ICD-10-CM | POA: Diagnosis not present

## 2022-04-26 DIAGNOSIS — M7121 Synovial cyst of popliteal space [Baker], right knee: Secondary | ICD-10-CM

## 2022-04-26 DIAGNOSIS — M1711 Unilateral primary osteoarthritis, right knee: Secondary | ICD-10-CM

## 2022-04-26 MED ORDER — TRIAMCINOLONE ACETONIDE 40 MG/ML IJ SUSP
40.0000 mg | Freq: Once | INTRAMUSCULAR | Status: AC
  Administered 2022-04-26: 40 mg via INTRAMUSCULAR

## 2022-04-26 NOTE — Telephone Encounter (Signed)
Last seen in September   Last written in June for 6 months.

## 2022-04-26 NOTE — Progress Notes (Signed)
    Procedures performed today:    Procedure: Real-time Ultrasound Guided aspiration of injection of right knee Baker's cyst Device: Samsung HS60  Verbal informed consent obtained.  Time-out conducted.  Noted no overlying erythema, induration, or other signs of local infection.  Skin prepped in a sterile fashion.  Local anesthesia: Topical Ethyl chloride.  With sterile technique and under real time ultrasound guidance: Baker's cyst noted, aspirated 5 mL of clear, straw-colored fluid, syringe switched and 1 cc lidocaine, 1 cc kenalog 40 directed easily. Completed without difficulty  Advised to call if fevers/chills, erythema, induration, drainage, or persistent bleeding.  Images permanently stored and available for review in PACS.  Impression: Technically successful ultrasound guided aspiration/injection.  Independent interpretation of notes and tests performed by another provider:   None.  Brief History, Exam, Impression, and Recommendations:    Primary osteoarthritis of right knee with tibial subchondral insufficiency fracture Pleasant 46 year old female, chronic multifactorial right knee pain, she has had several injections with Dr. Stann Mainland, viscosupplementation, ultimately we got an MRI that showed some mild anterior tibial T2 signal that was considered reactive versus a mild subchondral insufficiency fracture, she had a subchondroplasty in the past, medially she has some chondral thinning. We referred her back to [facility] for discussion of surgical intervention, it sounds like she had another cement subchondroplasty. She also is working with one of the pain management providers for genicular radiofrequency ablation. Today she complains of some fullness in the posterior aspect of her knee, I was unable to feel anything on exam but ultrasound does reveal a moderate-sized Baker's cyst, this was drained and injected today, further management really needs to be with Dr. Stann Mainland and her pain  provider. Return to see me as needed.    ____________________________________________ Gwen Her. Dianah Field, M.D., ABFM., CAQSM., AME. Primary Care and Sports Medicine [facility]  Adjunct Professor of [place] [employer]  Risk manager

## 2022-04-26 NOTE — Addendum Note (Signed)
Addended by: Tarri Glenn A on: 04/26/2022 02:27 PM   Modules accepted: Orders

## 2022-04-26 NOTE — Assessment & Plan Note (Signed)
Pleasant 46 year old female, chronic multifactorial right knee pain, she has had several injections with Dr. Stann Mainland, viscosupplementation, ultimately we got an MRI that showed some mild anterior tibial T2 signal that was considered reactive versus a mild subchondral insufficiency fracture, she had a subchondroplasty in the past, medially she has some chondral thinning. We referred her back to [facility] for discussion of surgical intervention, it sounds like she had another cement subchondroplasty. She also is working with one of the pain management providers for genicular radiofrequency ablation. Today she complains of some fullness in the posterior aspect of her knee, I was unable to feel anything on exam but ultrasound does reveal a moderate-sized Baker's cyst, this was drained and injected today, further management really needs to be with Dr. Stann Mainland and her pain provider. Return to see me as needed.

## 2022-04-27 ENCOUNTER — Encounter: Payer: Self-pay | Admitting: Physician Assistant

## 2022-04-27 DIAGNOSIS — Z720 Tobacco use: Secondary | ICD-10-CM

## 2022-04-27 DIAGNOSIS — F411 Generalized anxiety disorder: Secondary | ICD-10-CM

## 2022-05-04 ENCOUNTER — Other Ambulatory Visit: Payer: Self-pay | Admitting: Physician Assistant

## 2022-05-04 DIAGNOSIS — Z720 Tobacco use: Secondary | ICD-10-CM

## 2022-05-04 MED ORDER — ALPRAZOLAM 0.5 MG PO TABS: 0.5000 mg | ORAL_TABLET | Freq: Every day | ORAL | 0 refills | Status: DC | PRN

## 2022-05-04 MED ORDER — VARENICLINE TARTRATE 1 MG PO TABS: 1.0000 mg | ORAL_TABLET | Freq: Two times a day (BID) | ORAL | 2 refills | Status: DC

## 2022-05-04 NOTE — Telephone Encounter (Signed)
PA

## 2022-05-04 NOTE — Telephone Encounter (Signed)
Found out patient's young daughter is pregnant. She is very upset. Requesting refill on xanax. Has started back smoking would like refill of chantix. She has tolerated very well in the past.

## 2022-05-06 ENCOUNTER — Telehealth: Payer: Self-pay

## 2022-05-06 NOTE — Telephone Encounter (Signed)
Initiated Prior authorization XIP:[id number] Tartrate '10MG'$  tablets Via: Cover[employer]ymed Case/Key:[id number] Status: approved  as of 05/06/21 Reason:approved through 11/07/2022 Notified Pt via: Mychart

## 2022-05-23 DIAGNOSIS — Z6825 Body mass index (BMI) 25.0-25.9, adult: Secondary | ICD-10-CM | POA: Diagnosis not present

## 2022-05-23 DIAGNOSIS — Z9889 Other specified postprocedural states: Secondary | ICD-10-CM | POA: Diagnosis not present

## 2022-05-23 DIAGNOSIS — K5903 Drug induced constipation: Secondary | ICD-10-CM | POA: Diagnosis not present

## 2022-05-23 DIAGNOSIS — T402X5A Adverse effect of other opioids, initial encounter: Secondary | ICD-10-CM | POA: Diagnosis not present

## 2022-05-23 DIAGNOSIS — M25561 Pain in right knee: Secondary | ICD-10-CM | POA: Diagnosis not present

## 2022-05-23 DIAGNOSIS — M961 Postlaminectomy syndrome, not elsewhere classified: Secondary | ICD-10-CM | POA: Diagnosis not present

## 2022-05-23 DIAGNOSIS — M5010 Cervical disc disorder with radiculopathy, unspecified cervical region: Secondary | ICD-10-CM | POA: Diagnosis not present

## 2022-05-23 DIAGNOSIS — Z8781 Personal history of (healed) traumatic fracture: Secondary | ICD-10-CM | POA: Diagnosis not present

## 2022-05-23 DIAGNOSIS — Z79899 Other long term (current) drug therapy: Secondary | ICD-10-CM | POA: Diagnosis not present

## 2022-05-23 DIAGNOSIS — M5416 Radiculopathy, lumbar region: Secondary | ICD-10-CM | POA: Diagnosis not present

## 2022-05-24 ENCOUNTER — Telehealth: Payer: Self-pay

## 2022-05-24 MED ORDER — OSELTAMIVIR PHOSPHATE 75 MG PO CAPS: 75.0000 mg | ORAL_CAPSULE | Freq: Every day | ORAL | 0 refills | Status: DC

## 2022-05-24 NOTE — Telephone Encounter (Signed)
Tamiflu '75mg'$  daily prophylactic dose sent to the pharmacy on file.   ___________________________________________ Clearnce Sorrel, DNP, APRN, FNP-BC Primary Care and [place]

## 2022-05-24 NOTE — Telephone Encounter (Signed)
Patient states son was just diagnosed with Flu B today - she is requesting Tamiflu prophylactic due to her daughter being pregnant.

## 2022-05-24 NOTE — Telephone Encounter (Signed)
Patient informed. 

## 2022-05-25 DIAGNOSIS — M25561 Pain in right knee: Secondary | ICD-10-CM | POA: Diagnosis not present

## 2022-05-26 ENCOUNTER — Encounter: Payer: Self-pay | Admitting: Physician Assistant

## 2022-05-26 ENCOUNTER — Encounter: Payer: Self-pay | Admitting: Sports Medicine

## 2022-05-26 DIAGNOSIS — G2581 Restless legs syndrome: Secondary | ICD-10-CM

## 2022-05-26 NOTE — Telephone Encounter (Signed)
Patient requesting rx rf of ropinerole Med last written on 01/17/2022 Last OV where med was discussed  - strength increase  and told to return in 3 months Patient did return in 3 months but med not discussed in notes at that visit for sent for your reivew for refill.  Thank you, Lisette Abu, LPN :)

## 2022-05-27 MED ORDER — ROPINIROLE HCL 0.5 MG PO TABS: ORAL_TABLET | ORAL | 0 refills | Status: DC

## 2022-06-08 ENCOUNTER — Telehealth: Payer: Self-pay

## 2022-06-08 DIAGNOSIS — M5451 Vertebrogenic low back pain: Secondary | ICD-10-CM | POA: Diagnosis not present

## 2022-06-08 DIAGNOSIS — M9904 Segmental and somatic dysfunction of sacral region: Secondary | ICD-10-CM | POA: Diagnosis not present

## 2022-06-08 NOTE — Telephone Encounter (Signed)
Patient came into office to drop off surgery clearance form, form placed in Jade's box, thanks.

## 2022-06-15 ENCOUNTER — Other Ambulatory Visit: Payer: Self-pay | Admitting: Physician Assistant

## 2022-06-15 ENCOUNTER — Telehealth: Payer: Self-pay | Admitting: Neurology

## 2022-06-15 DIAGNOSIS — I10 Essential (primary) hypertension: Secondary | ICD-10-CM

## 2022-06-15 DIAGNOSIS — T887XXA Unspecified adverse effect of drug or medicament, initial encounter: Secondary | ICD-10-CM

## 2022-06-15 DIAGNOSIS — M7989 Other specified soft tissue disorders: Secondary | ICD-10-CM

## 2022-06-15 NOTE — Telephone Encounter (Signed)
Yes it looks like labs need to be done.

## 2022-06-15 NOTE — Telephone Encounter (Signed)
Received request for surgical clearance from Dr. Rolena Infante for [facility] Joint Fusion. Do you need appt to evaluate?

## 2022-06-15 NOTE — Telephone Encounter (Signed)
Please call patient to schedule preop clearance appt.

## 2022-06-22 ENCOUNTER — Encounter: Payer: Self-pay | Admitting: Physician Assistant

## 2022-06-22 ENCOUNTER — Ambulatory Visit (INDEPENDENT_AMBULATORY_CARE_PROVIDER_SITE_OTHER): Payer: Medicare Other | Admitting: Physician Assistant

## 2022-06-22 VITALS — BP 122/66 | HR 95 | Ht 63.0 in | Wt 147.2 lb

## 2022-06-22 DIAGNOSIS — Z01818 Encounter for other preprocedural examination: Secondary | ICD-10-CM | POA: Diagnosis not present

## 2022-06-22 DIAGNOSIS — F4329 Adjustment disorder with other symptoms: Secondary | ICD-10-CM

## 2022-06-22 NOTE — Progress Notes (Signed)
Established Patient Office Visit  Subjective   Patient ID: [name], female    DOB: 04-Jul-1975  Age: 47 y.o. MRN: [id number]  Chief Complaint  Patient presents with   Pre-op Exam    HPI Pt is a 47 yo female who presents to the clinic for pre-surgery clearance for upcoming SI joint fusion by Dr. Rolena Infante. Pt has had multiple ortho surgeries with no complications.   She is very upset this morning. Her daughter is [redacted] weeks pregnant and started bleeding vaginally today. She is concerned she might be having a miscarriage.   Patient Active Problem List   Diagnosis Date Noted   Motion sickness 01/11/2022   Osteoarthritis of first metatarsophalangeal (MTP) joint of right foot 10/18/2021   Lateral epicondylitis, right elbow 09/20/2021   Acute foot pain, right 09/20/2021   Baker's cyst of knee, right 05/21/2021   Contusion of hand, left 05/18/2021   Right leg swelling 05/18/2021   Former cigarette smoker 04/20/2021   Pneumonia of both lower lobes due to infectious organism 04/20/2021   Stress at home 12/29/2020   Injury of right knee 11/14/2020   Acute pain of right knee 11/14/2020   Numbness of right great toe 06/23/2020   Intractable right heel pain 05/04/2020   COVID-19 virus infection 01/14/2020   Impingement syndrome, shoulder, left 12/30/2019   Pes anserine bursitis 09/03/2019   Nodule right fifth PIP of hand 09/03/2019   Mood changes 08/25/2019   RLS (restless legs syndrome) 07/08/2019   Leukocytes in urine 03/04/2019   Other microscopic hematuria 03/04/2019   Left ovarian cyst 03/04/2019   Dog bite 02/15/2019   Mild episode of recurrent major depressive disorder ([place]) 10/30/2018   Traumatic ecchymosis of left foot 10/15/2018   Asthma 01/29/2018   Chronic neck pain 01/29/2018   Essential hypertension 10/06/2017   Hx of cold sores 10/06/2017   First degree ankle sprain, left, initial encounter 08/29/2017   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   Chronic pain syndrome  01/08/2017   Trigger thumb, left thumb 10/06/2016   Primary osteoarthritis of right knee with tibial subchondral insufficiency fracture 09/05/2016   Balance problems 07/19/2016   Dizziness 07/19/2016   Palpitations 04/14/2016   Primary osteoarthritis of left first metacarpophalangeal joint 04/04/2016   Fixed pupils 08/31/2015   Dysuria 08/31/2015   Depression 08/31/2015   Vasomotor flushing 06/15/2015   GAD (generalized anxiety disorder) 06/15/2015   Therapeutic opioid induced constipation 12/16/2014   S/P cervical spinal fusion 10/28/2014   Tachycardia 08/13/2014   Endometriosis 07/20/2014   Paresthesia 06/03/2014   Myofascial pain syndrome 05/29/2014   Carpal tunnel syndrome of left wrist 05/05/2014   Left shoulder pain 04/02/2014   Constipation due to opioid therapy 01/17/2014   Weight gain 11/29/2013   Greater trochanteric bursitis 11/29/2013   PTSD (post-traumatic stress disorder) 12/14/2012   Tick-borne disease 08/09/2010   TROCHANTERIC BURSITIS 06/21/2010   Tobacco abuse 02/05/2010   INTERSTITIAL CYSTITIS 02/05/2010   FATIGUE 07/02/2009   Depressed 06/26/2008   Anxiety state 07/06/2007   Primary insomnia 07/06/2007   HEADACHE 07/06/2007   Past Medical History:  Diagnosis Date   Acute medial meniscus tear of right knee    Anxiety    Arthritis    Asthma    Complication of anesthesia    likes iv med and scopolamine patch also, limited neck motion   COVID-19 01/15/2020   antibody infusion given 01-16-2020 no symptoms since infusion   Depression    Dysrhythmia    tachycardia  Elevated heart rate and blood pressure    takes toprol for increased heart rate   Family history of adverse reaction to anesthesia    mother got blood clots after anesthesia , mother on heparin now   GERD (gastroesophageal reflux disease)    Headache(784.0)    migraines   IC (interstitial cystitis)    Insomnia    Myofascial pain syndrome, cervical    Neck pain    C 2 and C 3 slipped  disc has neck pain with, limited neck motion   Ovarian cyst    x 2now   Pneumonia    PONV (postoperative nausea and vomiting)    Past Surgical History:  Procedure Laterality Date   ablation of nerves in back      CARPAL TUNNEL RELEASE Left 06/05/2014   Procedure: LEFT CARPAL TUNNEL RELEASE;  Surgeon: Daryll Brod, MD;  Location: [facility];  Service: Orthopedics;  Laterality: Left;   CARPAL TUNNEL RELEASE Right 08/25/2014   Procedure: RIGHT CARPAL TUNNEL RELEASE;  Surgeon: Daryll Brod, MD;  Location: [place];  Service: Orthopedics;  Laterality: Right;   CARPAL TUNNEL RELEASE Left 07/14/2015   Procedure: LEFT CARPAL TUNNEL RELEASE;  Surgeon: Daryll Brod, MD;  Location: [facility];  Service: Orthopedics;  Laterality: Left;   CERVICAL SPINE SURGERY  11/2013   dr Joya Salm   CESAREAN SECTION     x2   DILATION AND EVACUATION N/A 05/07/2013   Procedure: DILATATION AND EVACUATION with tissue sent for chromosome analysis;  Surgeon: Lovenia Kim, MD;  Location: [facility];  Service: Gynecology;  Laterality: N/A;   EPIDURAL STEROID INJECTION  03/20/2015   KNEE ARTHROSCOPY WITH MEDIAL MENISECTOMY Right 04/09/2019   Procedure: Right knee arthroscopy fat pad resection with partial medial meniscectomy;  Surgeon: Nicholes Stairs, MD;  Location[facility];  Service: Orthopedics;  Laterality: Right;  60 mins   KNEE ARTHROSCOPY WITH MEDIAL MENISECTOMY Right 12/31/2021   Procedure: KNEE ARTHROSCOPY WITH PARTIAL  MEDIAL MENISECTOMY;  Surgeon: Nicholes Stairs, MD;  Location: [facility];  Service: Orthopedics;  Laterality: Right;   KNEE ARTHROSCOPY WITH SUBCHONDROPLASTY Right 02/04/2020   Procedure: Right knee arthroscopy with partial medial meniscectomy with lateral tibia subchondroplasty;  Surgeon: Nicholes Stairs, MD;  Location[facility];  Service: Orthopedics;  Laterality: Right;  75 mins   KNEE ARTHROSCOPY WITH  SUBCHONDROPLASTY Right 12/31/2021   Procedure: KNEE ARTHROSCOPY WITH LATERAL TIBIA SUBCHONDROPLASTY;  Surgeon: Nicholes Stairs, MD;  Location: [facility];  Service: Orthopedics;  Laterality: Right;  75   left bicep tendon repair      left shoulder scope surgery      OVARIAN CYST REMOVAL     PELVIC LAPAROSCOPY  2003   endometriosis   POSTERIOR CERVICAL FUSION/FORAMINOTOMY N/A 10/28/2014   Procedure: Cervical Four-Cervical Seven Posterior cervical fusion with lateral mass fixation;  Surgeon: Eustace Moore, MD;  Location: [facility];  Service: Neurosurgery;  Laterality: N/A;  posterior   ROBOTIC ASSISTED LAPAROSCOPIC LYSIS OF ADHESION N/A 07/18/2014   Procedure: ROBOTIC ASSISTED LAPAROSCOPIC  EXCISION POSTERIOR  UTERINE WALL MASS; EXCISION RIGHT MESSALEINGEAL MASS, EXCISION AND ABLATION CULDASAC ENDOMETRIOSIS;  Surgeon: Brien Few, MD;  Location: [facility];  Service: Gynecology;  Laterality: N/A;   ROBOTIC ASSISTED TOTAL HYSTERECTOMY WITH SALPINGECTOMY Bilateral 05/11/2015   Procedure: ROBOTIC ASSISTED TOTAL HYSTERECTOMY WITH BILATERAL SALPINGECTOMY;  Surgeon: Brien Few, MD;  Location: [facility];  Service: Gynecology;  Laterality: Bilateral;  Family History  Problem Relation Age of Onset   Heart attack Father    Breast cancer Maternal Grandmother    Depression Cousin    ADD / ADHD Brother    Depression Brother    Allergies  Allergen Reactions   Gadolinium Derivatives Hives, Itching and Other (See Comments)    After MRI with Multihance finished, patient had redness and itching on chest, tightness in throat.  Patient went to ED to be monitored (04/23/14).had mri since takes benadryl before, tolerates well   Iodinated Contrast Media Rash and Itching   Prednisone Other (See Comments)    Pain all over, "it triggers my myofascial pain syndrome"    Ioversol     Other reaction(s): Hives   Amitriptyline Other (See Comments)    Gained weight and does not want to be on.    Eggs Or  Egg-Derived Products Rash    Childhood Reaction    Hydrocodone-Acetaminophen Itching    Tolerates with benadryl (no reaction to oxycodone)   Lunesta [Eszopiclone] Other (See Comments)    Metallic taste in mouth   Tramadol Itching      ROS   See HPI.  Objective:     BP 122/66 (BP Location: Right Arm, Patient Position: Sitting, Cuff Size: Small)   Pulse 95   Ht 5' 3"$  (1.6 m)   Wt 147 lb 3.2 oz (66.8 kg)   LMP 05/10/2015 (LMP Unknown)   SpO2 100%   BMI 26.08 kg/m  BP Readings from Last 3 Encounters:  06/22/22 122/66  12/31/21 126/64  12/21/21 123/79   Wt Readings from Last 3 Encounters:  06/22/22 147 lb 3.2 oz (66.8 kg)  01/11/22 140 lb (63.5 kg)  12/31/21 140 lb (63.5 kg)      Physical Exam Constitutional:      Appearance: Normal appearance.  HENT:     Head: Normocephalic.  Cardiovascular:     Rate and Rhythm: Normal rate and regular rhythm.  Pulmonary:     Effort: Pulmonary effort is normal.  Musculoskeletal:     Cervical back: Normal range of motion and neck supple. No tenderness.     Right lower leg: No edema.     Left lower leg: No edema.  Lymphadenopathy:     Cervical: No cervical adenopathy.  Neurological:     Mental Status: She is alert and oriented to person, place, and time.  Psychiatric:     Comments: tearful         Assessment & Plan:  .Marland KitchenBernie was seen today for pre-op exam.  Diagnoses and all orders for this visit:  Pre-op evaluation -     CBC w/Diff/Platelet -     COMPLETE METABOLIC PANEL WITH GFR  Adjustment disorder with disturbance of emotion   Vitals look great today PE no concerns EKG 11/2021 with no concerns Labs ordered Will fax form once labs received  Discussed mood with patient  Encouraged her to go be with her daughter Follow up as needed   Iran Planas, PA-C

## 2022-06-23 DIAGNOSIS — M5010 Cervical disc disorder with radiculopathy, unspecified cervical region: Secondary | ICD-10-CM | POA: Diagnosis not present

## 2022-06-23 DIAGNOSIS — Z6825 Body mass index (BMI) 25.0-25.9, adult: Secondary | ICD-10-CM | POA: Diagnosis not present

## 2022-06-23 DIAGNOSIS — T402X5A Adverse effect of other opioids, initial encounter: Secondary | ICD-10-CM | POA: Diagnosis not present

## 2022-06-23 DIAGNOSIS — M5416 Radiculopathy, lumbar region: Secondary | ICD-10-CM | POA: Diagnosis not present

## 2022-06-23 DIAGNOSIS — M25561 Pain in right knee: Secondary | ICD-10-CM | POA: Diagnosis not present

## 2022-06-23 DIAGNOSIS — M533 Sacrococcygeal disorders, not elsewhere classified: Secondary | ICD-10-CM | POA: Diagnosis not present

## 2022-06-23 DIAGNOSIS — M961 Postlaminectomy syndrome, not elsewhere classified: Secondary | ICD-10-CM | POA: Diagnosis not present

## 2022-06-23 DIAGNOSIS — Z79899 Other long term (current) drug therapy: Secondary | ICD-10-CM | POA: Diagnosis not present

## 2022-06-23 DIAGNOSIS — Z8781 Personal history of (healed) traumatic fracture: Secondary | ICD-10-CM | POA: Diagnosis not present

## 2022-06-23 DIAGNOSIS — Z9889 Other specified postprocedural states: Secondary | ICD-10-CM | POA: Diagnosis not present

## 2022-06-23 DIAGNOSIS — K5903 Drug induced constipation: Secondary | ICD-10-CM | POA: Diagnosis not present

## 2022-06-25 LAB — CBC WITH DIFFERENTIAL/PLATELET
Absolute Monocytes: 819 cells/uL (ref 200–950)
Basophils Absolute: 36 cells/uL (ref 0–200)
Basophils Relative: 0.4 %
Eosinophils Absolute: 309 cells/uL (ref 15–500)
Eosinophils Relative: 3.4 %
HCT: 46.6 % — ABNORMAL HIGH (ref 35.0–45.0)
Hemoglobin: 15.9 g/dL — ABNORMAL HIGH (ref 11.7–15.5)
Lymphs Abs: 2512 cells/uL (ref 850–3900)
MCH: 31.4 pg (ref 27.0–33.0)
MCHC: 34.1 g/dL (ref 32.0–36.0)
MCV: 92.1 fL (ref 80.0–100.0)
MPV: 11 fL (ref 7.5–12.5)
Monocytes Relative: 9 %
Neutro Abs: 5424 cells/uL (ref 1500–7800)
Neutrophils Relative %: 59.6 %
Platelets: 356 10*3/uL (ref 140–400)
RBC: 5.06 10*6/uL (ref 3.80–5.10)
RDW: 11.8 % (ref 11.0–15.0)
Total Lymphocyte: 27.6 %
WBC: 9.1 10*3/uL (ref 3.8–10.8)

## 2022-06-25 LAB — COMPLETE METABOLIC PANEL WITH GFR
AG Ratio: 1.8 (calc) (ref 1.0–2.5)
ALT: 8 U/L (ref 6–29)
AST: 12 U/L (ref 10–35)
Albumin: 4.4 g/dL (ref 3.6–5.1)
Alkaline phosphatase (APISO): 76 U/L (ref 31–125)
BUN: 11 mg/dL (ref 7–25)
CO2: 25 mmol/L (ref 20–32)
Calcium: 10 mg/dL (ref 8.6–10.2)
Chloride: 102 mmol/L (ref 98–110)
Creat: 0.91 mg/dL (ref 0.50–0.99)
Globulin: 2.5 g/dL (calc) (ref 1.9–3.7)
Glucose, Bld: 114 mg/dL — ABNORMAL HIGH (ref 65–99)
Potassium: 4.2 mmol/L (ref 3.5–5.3)
Sodium: 140 mmol/L (ref 135–146)
Total Bilirubin: 0.7 mg/dL (ref 0.2–1.2)
Total Protein: 6.9 g/dL (ref 6.1–8.1)
eGFR: 78 mL/min/{1.73_m2} (ref >=60)

## 2022-06-27 ENCOUNTER — Encounter: Payer: Self-pay | Admitting: Neurology

## 2022-06-27 NOTE — Progress Notes (Signed)
Cira,   Hemoglobin is elevated. Stop any oral iron. Are you smoking right now?

## 2022-06-28 ENCOUNTER — Encounter: Payer: Self-pay | Admitting: Physician Assistant

## 2022-06-28 ENCOUNTER — Telehealth: Payer: Self-pay | Admitting: Neurology

## 2022-06-28 NOTE — Telephone Encounter (Signed)
Patient called and LVM in response to lab results. She states she is taking iron daily and still smoking about 3 cigarettes a day. FYI.

## 2022-06-29 ENCOUNTER — Other Ambulatory Visit: Payer: Self-pay | Admitting: Orthopedic Surgery

## 2022-06-29 DIAGNOSIS — M533 Sacrococcygeal disorders, not elsewhere classified: Secondary | ICD-10-CM

## 2022-06-29 DIAGNOSIS — M5459 Other low back pain: Secondary | ICD-10-CM | POA: Diagnosis not present

## 2022-06-29 DIAGNOSIS — M5451 Vertebrogenic low back pain: Secondary | ICD-10-CM | POA: Diagnosis not present

## 2022-06-29 NOTE — Telephone Encounter (Signed)
Can you look and see how many of these dates we have appts around and about for?

## 2022-06-29 NOTE — Telephone Encounter (Signed)
Called patient, she states that she had misunderstood me when I asked if she was taking iron and she is not taking that. She will work on smoking cessation and recheck CBC in 3 months.

## 2022-07-04 ENCOUNTER — Other Ambulatory Visit: Payer: Self-pay | Admitting: Orthopedic Surgery

## 2022-07-04 ENCOUNTER — Ambulatory Visit
Admission: RE | Admit: 2022-07-04 | Discharge: 2022-07-04 | Disposition: A | Discharge status: Home / Self Care | Payer: Medicare Other | Source: Ambulatory Visit | Attending: Orthopedic Surgery | Admitting: Orthopedic Surgery

## 2022-07-04 DIAGNOSIS — M533 Sacrococcygeal disorders, not elsewhere classified: Secondary | ICD-10-CM

## 2022-07-06 DIAGNOSIS — M533 Sacrococcygeal disorders, not elsewhere classified: Secondary | ICD-10-CM | POA: Diagnosis not present

## 2022-07-09 ENCOUNTER — Other Ambulatory Visit: Payer: Self-pay | Admitting: Physician Assistant

## 2022-07-09 DIAGNOSIS — I10 Essential (primary) hypertension: Secondary | ICD-10-CM

## 2022-07-09 DIAGNOSIS — M7989 Other specified soft tissue disorders: Secondary | ICD-10-CM

## 2022-07-12 DIAGNOSIS — M25561 Pain in right knee: Secondary | ICD-10-CM | POA: Diagnosis not present

## 2022-07-18 ENCOUNTER — Other Ambulatory Visit: Payer: Self-pay | Admitting: Physician Assistant

## 2022-07-18 DIAGNOSIS — R4586 Emotional lability: Secondary | ICD-10-CM

## 2022-07-20 ENCOUNTER — Ambulatory Visit (INDEPENDENT_AMBULATORY_CARE_PROVIDER_SITE_OTHER): Payer: Medicare Other

## 2022-07-20 ENCOUNTER — Ambulatory Visit (INDEPENDENT_AMBULATORY_CARE_PROVIDER_SITE_OTHER): Payer: Medicare Other | Admitting: Sports Medicine

## 2022-07-20 DIAGNOSIS — F4322 Adjustment disorder with anxiety: Secondary | ICD-10-CM | POA: Diagnosis not present

## 2022-07-20 DIAGNOSIS — M67441 Ganglion, right hand: Secondary | ICD-10-CM

## 2022-07-20 DIAGNOSIS — F411 Generalized anxiety disorder: Secondary | ICD-10-CM

## 2022-07-20 MED ORDER — DICLOFENAC SODIUM 1 % EX GEL: 2.0000 g | Freq: Four times a day (QID) | CUTANEOUS | 11 refills | Status: DC

## 2022-07-20 MED ORDER — ALPRAZOLAM 1 MG PO TABS: 0.5000 mg | ORAL_TABLET | Freq: Every day | ORAL | 0 refills | Status: DC | PRN

## 2022-07-20 NOTE — Progress Notes (Signed)
    Procedures performed today:    None.  Independent interpretation of notes and tests performed by another provider:   None.  Brief History, Exam, Impression, and Recommendations:    Digital mucinous cyst of second DIP of right hand This is a pleasant 47 year old female, she has had a tender nodule palpable on her right second DIP. On exam this appears to be mucinous cyst in association with the distal interphalangeal joint, minimally tender. We will start conservatively, x-rays, topical Voltaren. She will add some meloxicam if not better in a few weeks, if still not better after meloxicam we can do an injection.  Adjustment disorder Having a lot of life changes coming up, her daughter has moved in and is pregnant She does have some back surgery coming up. She will continue sertraline and Seroquel, but she does have episodes of significantly increased anxiety I will refill her alprazolam. She can follow this up with her PCP.    ____________________________________________ Gwen Her. Dianah Field, M.D., ABFM., CAQSM., AME. Primary Care and Sports Medicine [facility]  Adjunct Professor of [employer]  Risk manager

## 2022-07-20 NOTE — Assessment & Plan Note (Signed)
Having a lot of life changes coming up, her daughter has moved in and is pregnant She does have some back surgery coming up. She will continue sertraline and Seroquel, but she does have episodes of significantly increased anxiety I will refill her alprazolam. She can follow this up with her PCP.

## 2022-07-20 NOTE — Assessment & Plan Note (Signed)
This is a pleasant 47 year old female, she has had a tender nodule palpable on her right second DIP. On exam this appears to be mucinous cyst in association with the distal interphalangeal joint, minimally tender. We will start conservatively, x-rays, topical Voltaren. She will add some meloxicam if not better in a few weeks, if still not better after meloxicam we can do an injection.

## 2022-07-22 DIAGNOSIS — M533 Sacrococcygeal disorders, not elsewhere classified: Secondary | ICD-10-CM | POA: Diagnosis not present

## 2022-07-22 DIAGNOSIS — M5416 Radiculopathy, lumbar region: Secondary | ICD-10-CM | POA: Diagnosis not present

## 2022-07-22 DIAGNOSIS — T402X5A Adverse effect of other opioids, initial encounter: Secondary | ICD-10-CM | POA: Diagnosis not present

## 2022-07-22 DIAGNOSIS — Z9889 Other specified postprocedural states: Secondary | ICD-10-CM | POA: Diagnosis not present

## 2022-07-22 DIAGNOSIS — M961 Postlaminectomy syndrome, not elsewhere classified: Secondary | ICD-10-CM | POA: Diagnosis not present

## 2022-07-22 DIAGNOSIS — N390 Urinary tract infection, site not specified: Secondary | ICD-10-CM | POA: Diagnosis not present

## 2022-07-22 DIAGNOSIS — M5010 Cervical disc disorder with radiculopathy, unspecified cervical region: Secondary | ICD-10-CM | POA: Diagnosis not present

## 2022-07-22 DIAGNOSIS — K5903 Drug induced constipation: Secondary | ICD-10-CM | POA: Diagnosis not present

## 2022-07-22 DIAGNOSIS — Z6825 Body mass index (BMI) 25.0-25.9, adult: Secondary | ICD-10-CM | POA: Diagnosis not present

## 2022-07-22 DIAGNOSIS — Z8781 Personal history of (healed) traumatic fracture: Secondary | ICD-10-CM | POA: Diagnosis not present

## 2022-07-22 DIAGNOSIS — M25561 Pain in right knee: Secondary | ICD-10-CM | POA: Diagnosis not present

## 2022-07-22 DIAGNOSIS — R300 Dysuria: Secondary | ICD-10-CM | POA: Diagnosis not present

## 2022-08-17 ENCOUNTER — Ambulatory Visit: Payer: Medicare Other | Admitting: Physician Assistant

## 2022-08-17 ENCOUNTER — Ambulatory Visit: Payer: Medicare Other | Admitting: Sports Medicine

## 2022-08-19 DIAGNOSIS — R350 Frequency of micturition: Secondary | ICD-10-CM | POA: Diagnosis not present

## 2022-08-19 DIAGNOSIS — Z6825 Body mass index (BMI) 25.0-25.9, adult: Secondary | ICD-10-CM | POA: Diagnosis not present

## 2022-08-19 DIAGNOSIS — K5903 Drug induced constipation: Secondary | ICD-10-CM | POA: Diagnosis not present

## 2022-08-19 DIAGNOSIS — T402X5A Adverse effect of other opioids, initial encounter: Secondary | ICD-10-CM | POA: Diagnosis not present

## 2022-08-19 DIAGNOSIS — M5010 Cervical disc disorder with radiculopathy, unspecified cervical region: Secondary | ICD-10-CM | POA: Diagnosis not present

## 2022-08-19 DIAGNOSIS — M961 Postlaminectomy syndrome, not elsewhere classified: Secondary | ICD-10-CM | POA: Diagnosis not present

## 2022-08-19 DIAGNOSIS — Z9889 Other specified postprocedural states: Secondary | ICD-10-CM | POA: Diagnosis not present

## 2022-08-19 DIAGNOSIS — R300 Dysuria: Secondary | ICD-10-CM | POA: Diagnosis not present

## 2022-08-19 DIAGNOSIS — M533 Sacrococcygeal disorders, not elsewhere classified: Secondary | ICD-10-CM | POA: Diagnosis not present

## 2022-08-19 DIAGNOSIS — M5416 Radiculopathy, lumbar region: Secondary | ICD-10-CM | POA: Diagnosis not present

## 2022-08-19 DIAGNOSIS — M25561 Pain in right knee: Secondary | ICD-10-CM | POA: Diagnosis not present

## 2022-08-19 DIAGNOSIS — Z8781 Personal history of (healed) traumatic fracture: Secondary | ICD-10-CM | POA: Diagnosis not present

## 2022-08-22 ENCOUNTER — Encounter: Payer: Self-pay | Admitting: Sports Medicine

## 2022-08-22 NOTE — Telephone Encounter (Signed)
To PCP

## 2022-08-23 ENCOUNTER — Encounter: Payer: Self-pay | Admitting: Physician Assistant

## 2022-08-23 DIAGNOSIS — F411 Generalized anxiety disorder: Secondary | ICD-10-CM

## 2022-08-23 DIAGNOSIS — F4322 Adjustment disorder with anxiety: Secondary | ICD-10-CM

## 2022-08-23 DIAGNOSIS — F330 Major depressive disorder, recurrent, mild: Secondary | ICD-10-CM

## 2022-08-24 ENCOUNTER — Other Ambulatory Visit: Payer: Self-pay | Admitting: Physician Assistant

## 2022-08-24 DIAGNOSIS — F330 Major depressive disorder, recurrent, mild: Secondary | ICD-10-CM

## 2022-08-24 MED ORDER — SERTRALINE HCL 50 MG PO TABS: ORAL_TABLET | ORAL | 1 refills | Status: DC

## 2022-08-24 MED ORDER — ALPRAZOLAM 1 MG PO TABS: 0.5000 mg | ORAL_TABLET | Freq: Every day | ORAL | 0 refills | Status: DC | PRN

## 2022-08-26 ENCOUNTER — Other Ambulatory Visit: Payer: Self-pay | Admitting: Physician Assistant

## 2022-08-26 DIAGNOSIS — R4586 Emotional lability: Secondary | ICD-10-CM

## 2022-09-05 DIAGNOSIS — L237 Allergic contact dermatitis due to plants, except food: Secondary | ICD-10-CM | POA: Diagnosis not present

## 2022-09-05 DIAGNOSIS — M5010 Cervical disc disorder with radiculopathy, unspecified cervical region: Secondary | ICD-10-CM | POA: Diagnosis not present

## 2022-09-05 DIAGNOSIS — Z6826 Body mass index (BMI) 26.0-26.9, adult: Secondary | ICD-10-CM | POA: Diagnosis not present

## 2022-09-08 ENCOUNTER — Encounter: Payer: Self-pay | Admitting: Physician Assistant

## 2022-09-08 NOTE — Telephone Encounter (Signed)
That should be fine. 

## 2022-09-09 ENCOUNTER — Telehealth (INDEPENDENT_AMBULATORY_CARE_PROVIDER_SITE_OTHER): Payer: Medicare Other | Admitting: Physician Assistant

## 2022-09-09 ENCOUNTER — Encounter: Payer: Self-pay | Admitting: Physician Assistant

## 2022-09-09 DIAGNOSIS — F411 Generalized anxiety disorder: Secondary | ICD-10-CM | POA: Diagnosis not present

## 2022-09-09 DIAGNOSIS — F4322 Adjustment disorder with anxiety: Secondary | ICD-10-CM | POA: Diagnosis not present

## 2022-09-09 DIAGNOSIS — T753XXD Motion sickness, subsequent encounter: Secondary | ICD-10-CM | POA: Diagnosis not present

## 2022-09-09 DIAGNOSIS — F330 Major depressive disorder, recurrent, mild: Secondary | ICD-10-CM

## 2022-09-09 MED ORDER — SCOPOLAMINE 1 MG/3DAYS TD PT72: 1.0000 | MEDICATED_PATCH | TRANSDERMAL | 0 refills | Status: DC

## 2022-09-09 MED ORDER — SERTRALINE HCL 50 MG PO TABS: ORAL_TABLET | ORAL | 1 refills | Status: DC

## 2022-09-09 MED ORDER — CLONAZEPAM 0.5 MG PO TABS: 0.5000 mg | ORAL_TABLET | Freq: Two times a day (BID) | ORAL | 1 refills | Status: DC | PRN

## 2022-09-09 NOTE — Progress Notes (Unsigned)
..Virtual Visit via Video Note  I connected with [name] on 09/12/22 at  3:00 PM EDT by a video enabled telemedicine application and verified that I am speaking with the correct person using two identifiers.  Location: Patient: home Provider: clinic  .Marland KitchenParticipating in visit:  Patient: [name] Provider: Tandy Gaw PA-C   I discussed the limitations of evaluation and management by telemedicine and the availability of in person appointments. The patient expressed understanding and agreed to proceed.  History of Present Illness: Pt is a 47 yo female who calls in to follow up on mood. She still has a lot going on in her life. Her daughter is pregnant and living with her. Her son has a lot going on in his life. She is very overwhelmed. She wonders if anything other than xanax she can use as needed for anxiety. She is using at least once a day. She did not start zoloft at last visit. No SI/HC.   Going on a cruise and worried about motion sickness. Would like patches.   .. Active Ambulatory Problems    Diagnosis Date Noted   Adjustment disorder 07/06/2007   Tobacco abuse 02/05/2010   Depressed 06/26/2008   INTERSTITIAL CYSTITIS 02/05/2010   Primary insomnia 07/06/2007   FATIGUE 07/02/2009   HEADACHE 07/06/2007   TROCHANTERIC BURSITIS 06/21/2010   Tick-borne disease 08/09/2010   PTSD (post-traumatic stress disorder) 12/14/2012   Weight gain 11/29/2013   Greater trochanteric bursitis 11/29/2013   Constipation due to opioid therapy 01/17/2014   Left shoulder pain 04/02/2014   Carpal tunnel syndrome of left wrist 05/05/2014   Myofascial pain syndrome 05/29/2014   Paresthesia 06/03/2014   Endometriosis 07/20/2014   Tachycardia 08/13/2014   S/P cervical spinal fusion 10/28/2014   Therapeutic opioid induced constipation 12/16/2014   Vasomotor flushing 06/15/2015   GAD (generalized anxiety disorder) 06/15/2015   Fixed pupils 08/31/2015   Dysuria 08/31/2015    Depression 08/31/2015   Primary osteoarthritis of left first metacarpophalangeal joint 04/04/2016   Palpitations 04/14/2016   Balance problems 07/19/2016   Dizziness 07/19/2016   Primary osteoarthritis of right knee with tibial subchondral insufficiency fracture 09/05/2016   Trigger thumb, left thumb 10/06/2016   Chronic pain syndrome 01/08/2017   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   First degree ankle sprain, left, initial encounter 08/29/2017   Essential hypertension 10/06/2017   Hx of cold sores 10/06/2017   Asthma 01/29/2018   Chronic neck pain 01/29/2018   Traumatic ecchymosis of left foot 10/15/2018   Mild episode of recurrent major depressive disorder (HCC) 10/30/2018   Dog bite 02/15/2019   Leukocytes in urine 03/04/2019   Other microscopic hematuria 03/04/2019   Left ovarian cyst 03/04/2019   RLS (restless legs syndrome) 07/08/2019   Mood changes 08/25/2019   Pes anserine bursitis 09/03/2019   Nodule right fifth PIP of hand 09/03/2019   Impingement syndrome, shoulder, left 12/30/2019   COVID-19 virus infection 01/14/2020   Intractable right heel pain 05/04/2020   Numbness of right great toe 06/23/2020   Injury of right knee 11/14/2020   Acute pain of right knee 11/14/2020   Stress at home 12/29/2020   Former cigarette smoker 04/20/2021   Pneumonia of both lower lobes due to infectious organism 04/20/2021   Contusion of hand, left 05/18/2021   Right leg swelling 05/18/2021   Baker's cyst of knee, right 05/21/2021   Lateral epicondylitis, right elbow 09/20/2021   Acute foot pain, right 09/20/2021   Osteoarthritis of first metatarsophalangeal (MTP) joint of right foot  10/18/2021   Motion sickness 01/11/2022   Digital mucinous cyst of second DIP of right hand 07/20/2022   Anxiety state 07/06/2007   Medication management 04/02/2019   Pes anserinus bursitis of right knee 09/05/2016   Resolved Ambulatory Problems    Diagnosis Date Noted   RHINITIS 10/13/2009   Polyuria  11/04/2010   Degenerative disc disease, cervical 10/31/2013   Cervical disc disorder with radiculopathy of cervical region 11/29/2013   Itching 11/29/2013   Radiculitis of left cervical region 01/17/2014   Nausea with vomiting 11/25/2014   Dysmenorrhea 05/11/2015   Cervical disc disorder with radiculopathy of cervical region 11/11/2015   Acute upper respiratory infection 04/04/2016   Localized primary carpometacarpal osteoarthritis, left 04/04/2016   Cervical post-laminectomy syndrome 07/08/2019   Past Medical History:  Diagnosis Date   Acute medial meniscus tear of right knee    Anxiety    Arthritis    Complication of anesthesia    COVID-19 01/15/2020   Dysrhythmia    Elevated heart rate and blood pressure    Family history of adverse reaction to anesthesia    GERD (gastroesophageal reflux disease)    IC (interstitial cystitis)    Insomnia    Myofascial pain syndrome, cervical    Neck pain    Ovarian cyst    Pneumonia    PONV (postoperative nausea and vomiting)        Observations/Objective: No acute distress Normal mood and appearance.  Assessment and Plan: .Marland KitchenHeavyn was seen today for follow-up.  Diagnoses and all orders for this visit:  Adjustment disorder with anxious mood -     sertraline (ZOLOFT) 50 MG tablet; Take 1/2 tablet for 7 days then increase to 1 tablet daily. -     clonazePAM (KLONOPIN) 0.5 MG tablet; Take 1 tablet (0.5 mg total) by mouth 2 (two) times daily as needed for anxiety.  Mild episode of recurrent major depressive disorder (HCC) -     sertraline (ZOLOFT) 50 MG tablet; Take 1/2 tablet for 7 days then increase to 1 tablet daily.  Anxiety state -     clonazePAM (KLONOPIN) 0.5 MG tablet; Take 1 tablet (0.5 mg total) by mouth 2 (two) times daily as needed for anxiety.  Motion sickness, subsequent encounter -     scopolamine (TRANSDERM-SCOP) 1 MG/3DAYS; Place 1 patch (1.5 mg total) onto the skin every 3 (three) days.   Pt never started  zoloft Stop xanax Start klonapin as not as short acting and less risk for dependence Discussed this is still as needed medication Encouraged to start zoloft to help with persistent anxiety and depression  Scopolamine given to use for upcoming cruise to prevent motion sickness.    Follow Up Instructions:    I discussed the assessment and treatment plan with the patient. The patient was provided an opportunity to ask questions and all were answered. The patient agreed with the plan and demonstrated an understanding of the instructions.   The patient was advised to call back or seek an in-person evaluation if the symptoms worsen or if the condition fails to improve as anticipated.    Tandy Gaw, PA-C

## 2022-09-09 NOTE — Telephone Encounter (Signed)
Thank you :)

## 2022-09-11 ENCOUNTER — Other Ambulatory Visit: Payer: Self-pay | Admitting: Physician Assistant

## 2022-09-11 DIAGNOSIS — T887XXA Unspecified adverse effect of drug or medicament, initial encounter: Secondary | ICD-10-CM

## 2022-09-12 ENCOUNTER — Encounter: Payer: Self-pay | Admitting: Physician Assistant

## 2022-09-14 ENCOUNTER — Ambulatory Visit: Payer: Medicare Other | Admitting: Physician Assistant

## 2022-09-15 DIAGNOSIS — M5010 Cervical disc disorder with radiculopathy, unspecified cervical region: Secondary | ICD-10-CM | POA: Diagnosis not present

## 2022-09-15 DIAGNOSIS — T7840XS Allergy, unspecified, sequela: Secondary | ICD-10-CM | POA: Diagnosis not present

## 2022-09-15 DIAGNOSIS — K5903 Drug induced constipation: Secondary | ICD-10-CM | POA: Diagnosis not present

## 2022-09-15 DIAGNOSIS — M533 Sacrococcygeal disorders, not elsewhere classified: Secondary | ICD-10-CM | POA: Diagnosis not present

## 2022-09-15 DIAGNOSIS — M25561 Pain in right knee: Secondary | ICD-10-CM | POA: Diagnosis not present

## 2022-09-15 DIAGNOSIS — Z8781 Personal history of (healed) traumatic fracture: Secondary | ICD-10-CM | POA: Diagnosis not present

## 2022-09-15 DIAGNOSIS — Z79899 Other long term (current) drug therapy: Secondary | ICD-10-CM | POA: Diagnosis not present

## 2022-09-15 DIAGNOSIS — Z9889 Other specified postprocedural states: Secondary | ICD-10-CM | POA: Diagnosis not present

## 2022-09-15 DIAGNOSIS — M961 Postlaminectomy syndrome, not elsewhere classified: Secondary | ICD-10-CM | POA: Diagnosis not present

## 2022-09-15 DIAGNOSIS — T402X5A Adverse effect of other opioids, initial encounter: Secondary | ICD-10-CM | POA: Diagnosis not present

## 2022-09-15 DIAGNOSIS — M5416 Radiculopathy, lumbar region: Secondary | ICD-10-CM | POA: Diagnosis not present

## 2022-09-15 DIAGNOSIS — Z6825 Body mass index (BMI) 25.0-25.9, adult: Secondary | ICD-10-CM | POA: Diagnosis not present

## 2022-09-28 ENCOUNTER — Encounter: Payer: Self-pay | Admitting: Medical-Surgical

## 2022-09-28 ENCOUNTER — Telehealth (INDEPENDENT_AMBULATORY_CARE_PROVIDER_SITE_OTHER): Payer: Medicare Other | Admitting: Medical-Surgical

## 2022-09-28 DIAGNOSIS — R509 Fever, unspecified: Secondary | ICD-10-CM

## 2022-09-28 DIAGNOSIS — R051 Acute cough: Secondary | ICD-10-CM

## 2022-09-28 MED ORDER — PROMETHAZINE-DM 6.25-15 MG/5ML PO SYRP: 5.0000 mL | ORAL_SOLUTION | Freq: Four times a day (QID) | ORAL | 0 refills | Status: DC | PRN

## 2022-09-28 NOTE — Progress Notes (Signed)
Virtual Visit via Video Note  I connected with [name] on 09/28/22 at  8:30 AM EDT by a video enabled telemedicine application and verified that I am speaking with the correct person using two identifiers.   I discussed the limitations of evaluation and management by telemedicine and the availability of in person appointments. The patient expressed understanding and agreed to proceed.  Patient location: home Provider locations: office  Subjective:    CC: Viral symptoms  HPI: Pleasant 47 year old female presenting via MyChart video visit with reports of symptoms that started on Saturday after getting off of the cruise ship where they had been on vacation for 1 week.  Her symptoms started with nausea, vomiting, and diarrhea.  She had a fever for several days with Tmax 102.5.  Has been having episodes where she feels overheated and has been coughing.  Notes that her cough is nonproductive however she feels like she has congestion deep in her chest that will not come out.  Having a shortness of breath and fatigue with severe coughing spells.  Has also had some post cough emesis and dry heaving.  Had a headache for several days but this is getting a little better.  Notes that she does have some nasal congestion today.  Has been using an inhaler as well as Mucinex without benefit.  Past medical history, Surgical history, Family history not pertinant except as noted below, Social history, Allergies, and medications have been entered into the medical record, reviewed, and corrections made.   Review of Systems: See HPI for pertinent positives and negatives.   Objective:    General: Speaking clearly in complete sentences without any shortness of breath.  Alert and oriented x3.  Normal judgment. No apparent acute distress.  Impression and Recommendations:    1. Acute cough 2.  Fever in adult On review of symptoms, it sounds as if she had gastroenteritis which could certainly explain  the nausea, vomiting, diarrhea, and fever.  These symptoms do appear to be resolving and she is able to eat and drink without difficulty so no further intervention needed.  Unclear if she has also had a viral upper respiratory infection similar to the illness her son has.  Would like to get her in for drive up swabs for flu and COVID today.  Patient verbalized understanding and is agreeable to swing by our office to have these completed.  With her shortness of breath and persistent cough, plan for chest x-ray today to rule out pneumonia as she has had this in the past.  Until results available, recommend conservative measures with over-the-counter medications for symptom management.  Adding Promethazine DM 5 mL 4 times daily as needed for cough. - DG Chest 2 View; Future - POCT Influenza A/B - POC COVID-19  I discussed the assessment and treatment plan with the patient. The patient was provided an opportunity to ask questions and all were answered. The patient agreed with the plan and demonstrated an understanding of the instructions.   The patient was advised to call back or seek an in-person evaluation if the symptoms worsen or if the condition fails to improve as anticipated.  25 minutes of non-face-to-face time was provided during this encounter.  Return if symptoms worsen or fail to improve.  Thayer Ohm, DNP, APRN, FNP-BC [facility]

## 2022-09-29 ENCOUNTER — Ambulatory Visit (INDEPENDENT_AMBULATORY_CARE_PROVIDER_SITE_OTHER): Payer: Medicare Other

## 2022-09-29 DIAGNOSIS — R051 Acute cough: Secondary | ICD-10-CM | POA: Diagnosis not present

## 2022-09-29 LAB — POCT INFLUENZA A/B
Influenza A, POC: NEGATIVE
Influenza B, POC: NEGATIVE

## 2022-09-29 LAB — POC COVID19 BINAXNOW: SARS Coronavirus 2 Ag: NEGATIVE

## 2022-09-30 ENCOUNTER — Encounter: Payer: Self-pay | Admitting: Medical-Surgical

## 2022-09-30 ENCOUNTER — Encounter: Payer: Self-pay | Admitting: Physician Assistant

## 2022-10-01 ENCOUNTER — Other Ambulatory Visit: Payer: Self-pay | Admitting: Physician Assistant

## 2022-10-01 DIAGNOSIS — F330 Major depressive disorder, recurrent, mild: Secondary | ICD-10-CM

## 2022-10-01 DIAGNOSIS — F4322 Adjustment disorder with anxiety: Secondary | ICD-10-CM

## 2022-10-03 MED ORDER — HYDROCODONE BIT-HOMATROP MBR 5-1.5 MG/5ML PO SOLN: 5.0000 mL | Freq: Four times a day (QID) | ORAL | 0 refills | Status: DC | PRN

## 2022-10-03 MED ORDER — METHYLPREDNISOLONE 4 MG PO TBPK: ORAL_TABLET | ORAL | 0 refills | Status: DC

## 2022-10-03 MED ORDER — AIRSUPRA 90-80 MCG/ACT IN AERO: 2.0000 | INHALATION_SPRAY | Freq: Four times a day (QID) | RESPIRATORY_TRACT | 0 refills | Status: AC | PRN

## 2022-10-17 ENCOUNTER — Other Ambulatory Visit: Payer: Self-pay | Admitting: Physician Assistant

## 2022-10-17 ENCOUNTER — Encounter: Payer: Self-pay | Admitting: Physician Assistant

## 2022-10-17 DIAGNOSIS — F5101 Primary insomnia: Secondary | ICD-10-CM

## 2022-10-17 DIAGNOSIS — T887XXA Unspecified adverse effect of drug or medicament, initial encounter: Secondary | ICD-10-CM

## 2022-10-18 MED ORDER — PROMETHAZINE HCL 25 MG PO TABS
25.0000 mg | ORAL_TABLET | Freq: Three times a day (TID) | ORAL | 0 refills | Status: DC | PRN
Start: 2022-10-18 — End: 2022-12-15

## 2022-10-18 MED ORDER — ZOLPIDEM TARTRATE 10 MG PO TABS: 10.0000 mg | ORAL_TABLET | Freq: Every day | ORAL | 1 refills | Status: DC

## 2022-10-20 ENCOUNTER — Other Ambulatory Visit: Payer: Self-pay | Admitting: Physician Assistant

## 2022-10-20 DIAGNOSIS — M79644 Pain in right finger(s): Secondary | ICD-10-CM | POA: Insufficient documentation

## 2022-10-20 DIAGNOSIS — Z1231 Encounter for screening mammogram for malignant neoplasm of breast: Secondary | ICD-10-CM

## 2022-10-26 ENCOUNTER — Ambulatory Visit (INDEPENDENT_AMBULATORY_CARE_PROVIDER_SITE_OTHER): Payer: Medicaid Other | Admitting: Sports Medicine

## 2022-10-26 ENCOUNTER — Encounter: Payer: Self-pay | Admitting: Sports Medicine

## 2022-10-26 ENCOUNTER — Ambulatory Visit (INDEPENDENT_AMBULATORY_CARE_PROVIDER_SITE_OTHER): Payer: Medicaid Other

## 2022-10-26 DIAGNOSIS — M79675 Pain in left toe(s): Secondary | ICD-10-CM | POA: Diagnosis not present

## 2022-10-26 DIAGNOSIS — M533 Sacrococcygeal disorders, not elsewhere classified: Secondary | ICD-10-CM | POA: Diagnosis not present

## 2022-10-26 DIAGNOSIS — M7989 Other specified soft tissue disorders: Secondary | ICD-10-CM

## 2022-10-26 DIAGNOSIS — M1711 Unilateral primary osteoarthritis, right knee: Secondary | ICD-10-CM

## 2022-10-26 MED ORDER — DICLOFENAC SODIUM 1 % EX GEL
4.0000 g | Freq: Four times a day (QID) | CUTANEOUS | 11 refills | Status: DC
Start: 2022-10-26 — End: 2022-12-20

## 2022-10-26 NOTE — Progress Notes (Signed)
    Procedures performed today:    None.  Independent interpretation of notes and tests performed by another provider:   None.  Brief History, Exam, Impression, and Recommendations:    Primary osteoarthritis of right knee with tibial subchondral insufficiency fracture This is a pleasant 47 year old female, chronic multifactorial right knee pain, she had a bunch of injections with Dr. Aundria Rud, viscosupplementation, ultimately MRI showed anterior tibial T2 signal considered reactive versus subchondral insufficiency fracture, she had a subchondroplasty in the past, medially. Subsequently had a genicular RFA.  This was about a month ago. Had some short-term initial relief and then recurrence of pain, we will go and get the MRI, she already had an x-ray done recently at her pain management provider, x-rays were unrevealing, proceeding with MRI, she does have an appointment with Dr. Aundria Rud next month.  She will do Voltaren and bracing in the meantime.  Swelling of fifth toe of left foot Kicked an object, now with pain and swelling fifth toe proximal phalanx, adding x-rays, continue postop shoe, she has this already.  Sacroiliac joint dysfunction Has had a few injection in the past, she also had RFA without efficacy, Dr. Shon Baton was planning fusion, but not improved by insurance. We can do intermittent injections for this. Return to see me as needed.    ____________________________________________ Ihor Austin. Benjamin Stain, M.D., ABFM., CAQSM., AME. Primary Care and Sports Medicine [facility]  Adjunct Professor of Family Medicine  [place] [employer] [facility] [employer]  Restaurant manager, fast food

## 2022-10-26 NOTE — Assessment & Plan Note (Signed)
Has had a few injection in the past, she also had RFA without efficacy, Dr. Shon Baton was planning fusion, but not improved by insurance. We can do intermittent injections for this. Return to see me as needed.

## 2022-10-26 NOTE — Assessment & Plan Note (Signed)
This is a pleasant 47 year old female, chronic multifactorial right knee pain, she had a bunch of injections with Dr. Aundria Rud, viscosupplementation, ultimately MRI showed anterior tibial T2 signal considered reactive versus subchondral insufficiency fracture, she had a subchondroplasty in the past, medially. Subsequently had a genicular RFA.  This was about a month ago. Had some short-term initial relief and then recurrence of pain, we will go and get the MRI, she already had an x-ray done recently at her pain management provider, x-rays were unrevealing, proceeding with MRI, she does have an appointment with Dr. Aundria Rud next month.  She will do Voltaren and bracing in the meantime.

## 2022-10-26 NOTE — Assessment & Plan Note (Signed)
Kicked an object, now with pain and swelling fifth toe proximal phalanx, adding x-rays, continue postop shoe, she has this already.

## 2022-10-30 ENCOUNTER — Ambulatory Visit (INDEPENDENT_AMBULATORY_CARE_PROVIDER_SITE_OTHER): Payer: Medicaid Other

## 2022-10-30 DIAGNOSIS — M1711 Unilateral primary osteoarthritis, right knee: Secondary | ICD-10-CM | POA: Diagnosis not present

## 2022-11-01 ENCOUNTER — Encounter: Payer: Self-pay | Admitting: Sports Medicine

## 2022-11-08 ENCOUNTER — Ambulatory Visit: Payer: Medicaid Other | Admitting: Physician Assistant

## 2022-11-08 ENCOUNTER — Telehealth: Payer: Self-pay | Admitting: Physician Assistant

## 2022-11-08 VITALS — BP 133/83 | HR 88 | Ht 63.0 in | Wt 147.0 lb

## 2022-11-08 DIAGNOSIS — F331 Major depressive disorder, recurrent, moderate: Secondary | ICD-10-CM

## 2022-11-08 DIAGNOSIS — F411 Generalized anxiety disorder: Secondary | ICD-10-CM

## 2022-11-08 MED ORDER — DULOXETINE HCL 30 MG PO CPEP
30.0000 mg | ORAL_CAPSULE | Freq: Every day | ORAL | 2 refills | Status: DC
Start: 2022-11-08 — End: 2022-12-09

## 2022-11-08 NOTE — Progress Notes (Unsigned)
Established Patient Office Visit  Subjective   Patient ID: BRADI ERKKILA, female    DOB: 1975-11-04  Age: 47 y.o. MRN: [id number]  Chief Complaint  Patient presents with   Follow-up    Fup on medication    HPI Pt is a 47 yo female here to follow up on mood. She thought zoloft made her more depressed and stopped it. She continues to be anxious and depressed. She has a new knee injury with old chronic pain injuries. Her daughter is 8 weeks a way from having a baby and they will both live with her. She is overwhelmed. She is only taking xanax when she has to about 2 a week. No SI/HC.   [name]. Active Ambulatory Problems    Diagnosis Date Noted   Adjustment disorder 07/06/2007   Tobacco abuse 02/05/2010   Depressed 06/26/2008   INTERSTITIAL CYSTITIS 02/05/2010   Primary insomnia 07/06/2007   FATIGUE 07/02/2009   HEADACHE 07/06/2007   TROCHANTERIC BURSITIS 06/21/2010   Tick-borne disease 08/09/2010   PTSD (post-traumatic stress disorder) 12/14/2012   Weight gain 11/29/2013   Greater trochanteric bursitis 11/29/2013   Constipation due to opioid therapy 01/17/2014   Left shoulder pain 04/02/2014   Carpal tunnel syndrome of left wrist 05/05/2014   Myofascial pain syndrome 05/29/2014   Paresthesia 06/03/2014   Endometriosis 07/20/2014   Tachycardia 08/13/2014   S/P cervical spinal fusion 10/28/2014   Therapeutic opioid induced constipation 12/16/2014   Vasomotor flushing 06/15/2015   GAD (generalized anxiety disorder) 06/15/2015   Fixed pupils 08/31/2015   Dysuria 08/31/2015   Depression 08/31/2015   Primary osteoarthritis of left first metacarpophalangeal joint 04/04/2016   Palpitations 04/14/2016   Balance problems 07/19/2016   Dizziness 07/19/2016   Primary osteoarthritis of right knee with tibial subchondral insufficiency fracture 09/05/2016   Trigger thumb, left thumb 10/06/2016   Chronic pain syndrome 01/08/2017   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   First degree ankle  sprain, left, initial encounter 08/29/2017   Essential hypertension 10/06/2017   Hx of cold sores 10/06/2017   Asthma 01/29/2018   Chronic neck pain 01/29/2018   Traumatic ecchymosis of left foot 10/15/2018   Mild episode of recurrent major depressive disorder (HCC) 10/30/2018   Dog bite 02/15/2019   Leukocytes in urine 03/04/2019   Other microscopic hematuria 03/04/2019   Left ovarian cyst 03/04/2019   RLS (restless legs syndrome) 07/08/2019   Mood changes 08/25/2019   Pes anserine bursitis 09/03/2019   Nodule right fifth PIP of hand 09/03/2019   Impingement syndrome, shoulder, left 12/30/2019   COVID-19 virus infection 01/14/2020   Intractable right heel pain 05/04/2020   Numbness of right great toe 06/23/2020   Injury of right knee 11/14/2020   Acute pain of right knee 11/14/2020   Stress at home 12/29/2020   Former cigarette smoker 04/20/2021   Pneumonia of both lower lobes due to infectious organism 04/20/2021   Contusion of hand, left 05/18/2021   Right leg swelling 05/18/2021   Baker's cyst of knee, right 05/21/2021   Lateral epicondylitis, right elbow 09/20/2021   Acute foot pain, right 09/20/2021   Osteoarthritis of first metatarsophalangeal (MTP) joint of right foot 10/18/2021   Motion sickness 01/11/2022   Digital mucinous cyst of second DIP of right hand 07/20/2022   Anxiety state 07/06/2007   Medication management 04/02/2019   Pes anserinus bursitis of right knee 09/05/2016   Swelling of fifth toe of left foot 10/26/2022   Sacroiliac joint dysfunction 10/26/2022   Resolved Ambulatory  Problems    Diagnosis Date Noted   RHINITIS 10/13/2009   Polyuria 11/04/2010   Degenerative disc disease, cervical 10/31/2013   Cervical disc disorder with radiculopathy of cervical region 11/29/2013   Itching 11/29/2013   Radiculitis of left cervical region 01/17/2014   Nausea with vomiting 11/25/2014   Dysmenorrhea 05/11/2015   Cervical disc disorder with radiculopathy of  cervical region 11/11/2015   Acute upper respiratory infection 04/04/2016   Localized primary carpometacarpal osteoarthritis, left 04/04/2016   Cervical post-laminectomy syndrome 07/08/2019   Past Medical History:  Diagnosis Date   Acute medial meniscus tear of right knee    Anxiety    Arthritis    Complication of anesthesia    COVID-19 01/15/2020   Dysrhythmia    Elevated heart rate and blood pressure    Family history of adverse reaction to anesthesia    GERD (gastroesophageal reflux disease)    IC (interstitial cystitis)    Insomnia    Myofascial pain syndrome, cervical    Neck pain    Ovarian cyst    Pneumonia    PONV (postoperative nausea and vomiting)      ROS   See HPI.  Objective:     BP 133/83   Pulse 88   Ht 5\' 3"  (1.6 m)   Wt 147 lb (66.7 kg)   LMP 05/10/2015 (LMP Unknown)   SpO2 99%   BMI 26.04 kg/m  BP Readings from Last 3 Encounters:  11/08/22 133/83  06/22/22 122/66  12/31/21 126/64   Wt Readings from Last 3 Encounters:  11/08/22 147 lb (66.7 kg)  06/22/22 147 lb 3.2 oz (66.8 kg)  01/11/22 140 lb (63.5 kg)    ..    11/08/2022    2:43 PM 06/22/2022   10:22 AM 01/11/2022   10:07 AM 10/11/2021   10:05 AM 05/06/2021    8:56 AM  Depression screen PHQ 2/9  Decreased Interest 3 0 1 0 0  Down, Depressed, Hopeless 1 0 0 1 0  PHQ - 2 Score 4 0 1 1 0  Altered sleeping 3  2  0  Tired, decreased energy 1  3  0  Change in appetite 0  0  0  Feeling bad or failure about yourself  1  0  0  Trouble concentrating 2  2  0  Moving slowly or fidgety/restless 1  0  0  Suicidal thoughts 0  0  0  PHQ-9 Score 12  8  0  Difficult doing work/chores Very difficult  Somewhat difficult  Not difficult at all   ..    11/08/2022    2:44 PM 01/11/2022   10:09 AM 12/25/2020   10:50 AM 02/26/2020    2:13 PM  GAD 7 : Generalized Anxiety Score  Nervous, Anxious, on Edge 3 1 3 2   Control/stop worrying 3 1 3 2   Worry too much - different things 3 3 3 3   Trouble relaxing  3 1 3 3   Restless 3 3 3 3   Easily annoyed or irritable 3 1 3 1   Afraid - awful might happen 1 0 3 3  Total GAD 7 Score 19 10 21 17   Anxiety Difficulty Extremely difficult Not difficult at all Not difficult at all       Physical Exam Constitutional:      Appearance: Normal appearance.  HENT:     Head: Normocephalic.  Cardiovascular:     Rate and Rhythm: Normal rate and regular rhythm.  Pulses: Normal pulses.  Pulmonary:     Effort: Pulmonary effort is normal.     Breath sounds: Normal breath sounds.  Neurological:     General: No focal deficit present.     Mental Status: She is alert and oriented to person, place, and time.  Psychiatric:        Mood and Affect: Mood normal.        Assessment & Plan:  .Marland KitchenOlufunke was seen today for follow-up.  Diagnoses and all orders for this visit:  Moderate episode of recurrent major depressive disorder (HCC) -     DULoxetine (CYMBALTA) 30 MG capsule; Take 1 capsule (30 mg total) by mouth daily.  GAD (generalized anxiety disorder) -     DULoxetine (CYMBALTA) 30 MG capsule; Take 1 capsule (30 mg total) by mouth daily.   PHQ/GAD not to goal.  Discussed coping skills Start cymbalta daily hopefully this can help with pain as well.    Return for 4-6 weeks for F/U on depression/anxiety.    Tandy Gaw, PA-C

## 2022-11-08 NOTE — Patient Instructions (Signed)

## 2022-11-08 NOTE — Telephone Encounter (Signed)
Patient is requesting that Yaak placard be completed. Form placed in [facility] folder to be completed by provider.

## 2022-11-09 ENCOUNTER — Ambulatory Visit: Payer: Medicaid Other | Admitting: Medical-Surgical

## 2022-11-09 ENCOUNTER — Encounter: Payer: Self-pay | Admitting: Physician Assistant

## 2022-11-09 ENCOUNTER — Ambulatory Visit: Payer: Medicaid Other | Admitting: Sports Medicine

## 2022-11-09 DIAGNOSIS — M65942 Unspecified synovitis and tenosynovitis, left hand: Secondary | ICD-10-CM | POA: Insufficient documentation

## 2022-11-09 DIAGNOSIS — M659 Synovitis and tenosynovitis, unspecified: Secondary | ICD-10-CM | POA: Insufficient documentation

## 2022-11-09 NOTE — Assessment & Plan Note (Signed)
Pleasant 47 year old female, recent increasing pain left hand volar aspect just proximal to the fifth metacarpal phalangeal joint, on exam she has tenderness at the A1 pulley of the flexor tendons. She does not have any triggering but I do feel a palpable flexor tendon nodule, I discussed the anatomy and pathophysiology, we will buddy tape her fourth and fifth fingers, she will do Mobic for 2 weeks, she will also do some home PT and return to see me in 2 to 3 weeks, I can do a flexor tendon sheath injection if not better.

## 2022-11-09 NOTE — Progress Notes (Signed)
    Procedures performed today:    None.  Independent interpretation of notes and tests performed by another provider:   None.  Brief History, Exam, Impression, and Recommendations:    Tenosynovitis of left hand little finger Pleasant 47 year old female, recent increasing pain left hand volar aspect just proximal to the fifth metacarpal phalangeal joint, on exam she has tenderness at the A1 pulley of the flexor tendons. She does not have any triggering but I do feel a palpable flexor tendon nodule, I discussed the anatomy and pathophysiology, we will buddy tape her fourth and fifth fingers, she will do Mobic for 2 weeks, she will also do some home PT and return to see me in 2 to 3 weeks, I can do a flexor tendon sheath injection if not better.    ____________________________________________ Ihor Austin. Benjamin Stain, M.D., ABFM., CAQSM., AME. Primary Care and Sports Medicine [facility]  Adjunct Professor of Family Medicine  [place] [employer] [facility]  Restaurant manager, fast food

## 2022-11-16 ENCOUNTER — Encounter: Payer: Self-pay | Admitting: Physician Assistant

## 2022-11-16 NOTE — Telephone Encounter (Signed)
Message forwarded to provider under correct patient name.

## 2022-11-18 ENCOUNTER — Encounter: Payer: Self-pay | Admitting: Physician Assistant

## 2022-11-21 ENCOUNTER — Encounter: Payer: Self-pay | Admitting: Physician Assistant

## 2022-11-21 DIAGNOSIS — R2231 Localized swelling, mass and lump, right upper limb: Secondary | ICD-10-CM | POA: Insufficient documentation

## 2022-11-22 MED ORDER — PREGABALIN 200 MG PO CAPS: 200.0000 mg | ORAL_CAPSULE | Freq: Three times a day (TID) | ORAL | 0 refills | Status: DC

## 2022-11-22 NOTE — Addendum Note (Signed)
Addended byJomarie Longs on: 11/22/2022 03:00 PM   Modules accepted: Orders

## 2022-11-29 ENCOUNTER — Ambulatory Visit (HOSPITAL_COMMUNITY): Payer: Self-pay | Admitting: Orthopedic Surgery

## 2022-11-29 ENCOUNTER — Encounter: Payer: Self-pay | Admitting: Sports Medicine

## 2022-11-29 ENCOUNTER — Encounter: Payer: Self-pay | Admitting: Physician Assistant

## 2022-11-29 DIAGNOSIS — M25561 Pain in right knee: Secondary | ICD-10-CM

## 2022-12-06 ENCOUNTER — Ambulatory Visit: Payer: Medicaid Other

## 2022-12-08 ENCOUNTER — Other Ambulatory Visit: Payer: Self-pay | Admitting: Physician Assistant

## 2022-12-08 DIAGNOSIS — F331 Major depressive disorder, recurrent, moderate: Secondary | ICD-10-CM

## 2022-12-08 DIAGNOSIS — F411 Generalized anxiety disorder: Secondary | ICD-10-CM

## 2022-12-12 ENCOUNTER — Other Ambulatory Visit: Payer: Self-pay | Admitting: Physician Assistant

## 2022-12-12 ENCOUNTER — Other Ambulatory Visit: Payer: Self-pay

## 2022-12-12 DIAGNOSIS — Z720 Tobacco use: Secondary | ICD-10-CM

## 2022-12-14 ENCOUNTER — Other Ambulatory Visit: Payer: Self-pay | Admitting: Physician Assistant

## 2022-12-14 ENCOUNTER — Encounter: Payer: Self-pay | Admitting: Physician Assistant

## 2022-12-14 DIAGNOSIS — T887XXA Unspecified adverse effect of drug or medicament, initial encounter: Secondary | ICD-10-CM

## 2022-12-14 DIAGNOSIS — R4586 Emotional lability: Secondary | ICD-10-CM

## 2022-12-14 MED ORDER — QUETIAPINE FUMARATE 100 MG PO TABS
ORAL_TABLET | ORAL | 0 refills | Status: DC
Start: 2022-12-14 — End: 2023-03-08

## 2022-12-15 ENCOUNTER — Encounter: Payer: Self-pay | Admitting: Physician Assistant

## 2022-12-16 ENCOUNTER — Ambulatory Visit: Payer: Medicaid Other

## 2022-12-20 ENCOUNTER — Encounter: Payer: Self-pay | Admitting: Physician Assistant

## 2022-12-20 ENCOUNTER — Ambulatory Visit: Payer: Medicaid Other | Admitting: Physician Assistant

## 2022-12-20 VITALS — BP 125/75 | HR 104 | Ht 63.0 in | Wt 147.0 lb

## 2022-12-20 DIAGNOSIS — G43009 Migraine without aura, not intractable, without status migrainosus: Secondary | ICD-10-CM

## 2022-12-20 DIAGNOSIS — Z981 Arthrodesis status: Secondary | ICD-10-CM

## 2022-12-20 DIAGNOSIS — M5030 Other cervical disc degeneration, unspecified cervical region: Secondary | ICD-10-CM | POA: Diagnosis not present

## 2022-12-20 DIAGNOSIS — M5412 Radiculopathy, cervical region: Secondary | ICD-10-CM

## 2022-12-20 DIAGNOSIS — M542 Cervicalgia: Secondary | ICD-10-CM

## 2022-12-20 MED ORDER — RIZATRIPTAN BENZOATE 10 MG PO TABS
ORAL_TABLET | ORAL | 5 refills | Status: DC
Start: 2022-12-20 — End: 2023-09-26

## 2022-12-20 MED ORDER — KETOROLAC TROMETHAMINE 60 MG/2ML IM SOLN
60.0000 mg | Freq: Once | INTRAMUSCULAR | Status: AC
Start: 2022-12-20 — End: 2022-12-20
  Administered 2022-12-20: 60 mg via INTRAMUSCULAR

## 2022-12-20 MED ORDER — KETOROLAC TROMETHAMINE 10 MG PO TABS
10.0000 mg | ORAL_TABLET | Freq: Three times a day (TID) | ORAL | 0 refills | Status: DC | PRN
Start: 2022-12-20 — End: 2023-01-19

## 2022-12-20 NOTE — Progress Notes (Signed)
Established Patient Office Visit  Subjective   Patient ID: [name], female    DOB: 09-Sep-1975  Age: 47 y.o. MRN: [id number]  Chief Complaint  Patient presents with   mood    HPI Pt is a 47 yo female who presents to the clinic to follow up on mood.   She is doing better with increase in seroquel. She stopped cymbalta. She very rarely uses klonapin.   She does c/o right cervical pain with numbness, tingling pain down right arm into elbow. She has been having some strength issues in both hands. Hx of cervical fusion in 2016 by Dr. Yetta Barre. Has appt with him 9/5. She denies any injury. She is on lyrica and oxycodone has needed. Worsening symptoms for the last month.    Migraines well controlled with 2-4 a month that are rescued by maxalt.  .. Active Ambulatory Problems    Diagnosis Date Noted   Adjustment disorder 07/06/2007   Tobacco abuse 02/05/2010   Depressed 06/26/2008   INTERSTITIAL CYSTITIS 02/05/2010   Primary insomnia 07/06/2007   FATIGUE 07/02/2009   HEADACHE 07/06/2007   TROCHANTERIC BURSITIS 06/21/2010   Tick-borne disease 08/09/2010   PTSD (post-traumatic stress disorder) 12/14/2012   Weight gain 11/29/2013   Greater trochanteric bursitis 11/29/2013   Constipation due to opioid therapy 01/17/2014   Right cervical radiculopathy 01/17/2014   Left shoulder pain 04/02/2014   Carpal tunnel syndrome of left wrist 05/05/2014   Myofascial pain syndrome 05/29/2014   Paresthesia 06/03/2014   Endometriosis 07/20/2014   Tachycardia 08/13/2014   S/P cervical spinal fusion 10/28/2014   Therapeutic opioid induced constipation 12/16/2014   Vasomotor flushing 06/15/2015   GAD (generalized anxiety disorder) 06/15/2015   Fixed pupils 08/31/2015   Dysuria 08/31/2015   Depression 08/31/2015   Primary osteoarthritis of left first metacarpophalangeal joint 04/04/2016   Palpitations 04/14/2016   Balance problems 07/19/2016   Dizziness 07/19/2016   Primary  osteoarthritis of right knee with tibial subchondral insufficiency fracture 09/05/2016   Trigger thumb, left thumb 10/06/2016   Chronic pain syndrome 01/08/2017   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   First degree ankle sprain, left, initial encounter 08/29/2017   Essential hypertension 10/06/2017   Hx of cold sores 10/06/2017   Asthma 01/29/2018   Chronic neck pain 01/29/2018   Traumatic ecchymosis of left foot 10/15/2018   Mild episode of recurrent major depressive disorder (HCC) 10/30/2018   Dog bite 02/15/2019   Leukocytes in urine 03/04/2019   Other microscopic hematuria 03/04/2019   Left ovarian cyst 03/04/2019   RLS (restless legs syndrome) 07/08/2019   Mood changes 08/25/2019   Pes anserine bursitis 09/03/2019   Nodule right fifth PIP of hand 09/03/2019   Impingement syndrome, shoulder, left 12/30/2019   COVID-19 virus infection 01/14/2020   Intractable right heel pain 05/04/2020   Numbness of right great toe 06/23/2020   Injury of right knee 11/14/2020   Acute pain of right knee 11/14/2020   Stress at home 12/29/2020   Former cigarette smoker 04/20/2021   Pneumonia of both lower lobes due to infectious organism 04/20/2021   Contusion of hand, left 05/18/2021   Right leg swelling 05/18/2021   Baker's cyst of knee, right 05/21/2021   Lateral epicondylitis, right elbow 09/20/2021   Acute foot pain, right 09/20/2021   Osteoarthritis of first metatarsophalangeal (MTP) joint of right foot 10/18/2021   Motion sickness 01/11/2022   Digital mucinous cyst of second DIP of right hand 07/20/2022   Anxiety state 07/06/2007   Medication  management 04/02/2019   Pes anserinus bursitis of right knee 09/05/2016   Swelling of fifth toe of left foot 10/26/2022   Sacroiliac joint dysfunction 10/26/2022   Tenosynovitis of left hand little finger 11/09/2022   Headache 07/06/2007   Mass of skin of finger of right hand 11/21/2022   Pain in finger of right hand 10/20/2022   Resolved Ambulatory  Problems    Diagnosis Date Noted   RHINITIS 10/13/2009   Polyuria 11/04/2010   Degenerative disc disease, cervical 10/31/2013   Cervical disc disorder with radiculopathy of cervical region 11/29/2013   Itching 11/29/2013   Nausea with vomiting 11/25/2014   Dysmenorrhea 05/11/2015   Cervical disc disorder with radiculopathy of cervical region 11/11/2015   Acute upper respiratory infection 04/04/2016   Localized primary carpometacarpal osteoarthritis, left 04/04/2016   Cervical post-laminectomy syndrome 07/08/2019   Past Medical History:  Diagnosis Date   Acute medial meniscus tear of right knee    Anxiety    Arthritis    Complication of anesthesia    COVID-19 01/15/2020   Dysrhythmia    Elevated heart rate and blood pressure    Family history of adverse reaction to anesthesia    GERD (gastroesophageal reflux disease)    IC (interstitial cystitis)    Insomnia    Myofascial pain syndrome, cervical    Neck pain    Ovarian cyst    Pneumonia    PONV (postoperative nausea and vomiting)     ROS See HPI.    Objective:     BP 125/75   Pulse (!) 104   Ht 5\' 3"  (1.6 m)   Wt 147 lb (66.7 kg)   LMP 05/10/2015 (LMP Unknown)   SpO2 96%   BMI 26.04 kg/m  BP Readings from Last 3 Encounters:  12/20/22 125/75  11/08/22 133/83  06/22/22 122/66   Wt Readings from Last 3 Encounters:  12/20/22 147 lb (66.7 kg)  11/08/22 147 lb (66.7 kg)  06/22/22 147 lb 3.2 oz (66.8 kg)    ..    12/20/2022    1:22 PM 11/08/2022    2:43 PM 06/22/2022   10:22 AM 01/11/2022   10:07 AM 10/11/2021   10:05 AM  Depression screen PHQ 2/9  Decreased Interest 1 3 0 1 0  Down, Depressed, Hopeless 1 1 0 0 1  PHQ - 2 Score 2 4 0 1 1  Altered sleeping 3 3  2    Tired, decreased energy 1 1  3    Change in appetite 0 0  0   Feeling bad or failure about yourself  0 1  0   Trouble concentrating 1 2  2    Moving slowly or fidgety/restless 1 1  0   Suicidal thoughts 0 0  0   PHQ-9 Score 8 12  8    Difficult  doing work/chores Somewhat difficult Very difficult  Somewhat difficult    .[name]    12/20/2022    1:23 PM 11/08/2022    2:44 PM 01/11/2022   10:09 AM 12/25/2020   10:50 AM  GAD 7 : Generalized Anxiety Score  Nervous, Anxious, on Edge 2 3 1 3   Control/stop worrying 3 3 1 3   Worry too much - different things 3 3 3 3   Trouble relaxing 3 3 1 3   Restless 3 3 3 3   Easily annoyed or irritable 2 3 1 3   Afraid - awful might happen 3 1 0 3  Total GAD 7 Score 19 19 10 21   Anxiety  Difficulty Somewhat difficult Extremely difficult Not difficult at all Not difficult at all      Physical Exam Constitutional:      Appearance: Normal appearance.  HENT:     Head: Normocephalic.  Cardiovascular:     Rate and Rhythm: Normal rate and regular rhythm.  Pulmonary:     Effort: Pulmonary effort is normal.  Musculoskeletal:     Comments: No cervical tenderness to palpitations.  Right upper right back muscles to palpation.  NROM of right shoulder Strength 4/5 right upper extremity.   Neurological:     General: No focal deficit present.     Mental Status: She is alert.        Assessment & Plan:  .Marland KitchenKarmella was seen today for mood.  Diagnoses and all orders for this visit:  Right cervical radiculopathy -     ketorolac (TORADOL) 10 MG tablet; Take 1 tablet (10 mg total) by mouth every 8 (eight) hours as needed. -     ketorolac (TORADOL) injection 60 mg  Migraine without aura and without status migrainosus, not intractable -     rizatriptan (MAXALT) 10 MG tablet; TAKE 1 TABLET AS NEEDED FOR MIGRAINE, MAY REPEAT 2 HRS AS NEEDED  History of fusion of cervical spine -     ketorolac (TORADOL) 10 MG tablet; Take 1 tablet (10 mg total) by mouth every 8 (eight) hours as needed. -     ketorolac (TORADOL) injection 60 mg  DDD (degenerative disc disease), cervical -     ketorolac (TORADOL) 10 MG tablet; Take 1 tablet (10 mg total) by mouth every 8 (eight) hours as needed. -     ketorolac (TORADOL)  injection 60 mg  Cervicalgia -     MR CERVICAL SPINE WO CONTRAST; Future   Anxiety still high but depression and overall mood better on Seroquel bid. She would like to continue this.   Pt request MRI due to worsening pain right neck and radiation down arm and for neurosurgery to be able to view at September appt.  Continue to use lyrica and robaxin.  Toradol 60mg  IM and oral tablets as needed for breakthrough pain.   Migraines controlled-maxalt refilled for as needed.    Return in about 3 months (around 03/22/2023).    Tandy Gaw, PA-C

## 2022-12-25 ENCOUNTER — Other Ambulatory Visit: Payer: Medicaid Other

## 2022-12-26 ENCOUNTER — Other Ambulatory Visit: Payer: Medicaid Other

## 2022-12-27 ENCOUNTER — Ambulatory Visit (INDEPENDENT_AMBULATORY_CARE_PROVIDER_SITE_OTHER): Payer: Medicaid Other

## 2022-12-27 DIAGNOSIS — M542 Cervicalgia: Secondary | ICD-10-CM

## 2022-12-28 ENCOUNTER — Ambulatory Visit
Admission: RE | Admit: 2022-12-28 | Discharge: 2022-12-28 | Disposition: A | Discharge status: Home / Self Care | Payer: Medicaid Other | Source: Ambulatory Visit | Attending: Physician Assistant | Admitting: Physician Assistant

## 2022-12-28 DIAGNOSIS — Z1231 Encounter for screening mammogram for malignant neoplasm of breast: Secondary | ICD-10-CM

## 2022-12-28 NOTE — Progress Notes (Signed)
Surgical Instructions    Your procedure is scheduled on Thursday, September 19th, 2024.   Report to [facility] Main Entrance "A" at 05:30 A.M., then check in with the Admitting office.  Call this number if you have problems the morning of surgery:  [phone]   If you have any questions prior to your surgery date call [phone]: Open Monday-Friday 8am-4pm If you experience any cold or flu symptoms such as cough, fever, chills, shortness of breath, etc. between now and your scheduled surgery, please notify us at the above number     Remember:  Do not eat after midnight the night before your surgery  You may drink clear liquids until 04:30 the morning of your surgery.   Clear liquids allowed are: Water, Non-Citrus Juices (without pulp), Carbonated Beverages, Clear Tea, Black Coffee ONLY (NO MILK, CREAM OR POWDERED CREAMER of any kind), and Gatorade    Take these medicines the morning of surgery with A SIP OF WATER:   methocarbamol (ROBAXIN)  pantoprazole (PROTONIX)  pregabalin (LYRICA)  QUEtiapine (SEROQUEL)  rOPINIRole (REQUIP)   As needed:  albuterol (PROVENTIL)  Albuterol-Budesonide (AIRSUPRA)  clonazePAM (KLONOPIN)  ketorolac (TORADOL)  metoprolol succinate (TOPROL-XL)  Oxycodone HCl  promethazine (PHENERGAN)  rizatriptan (MAXALT)  VENTOLIN HFA   Please bring all inhalers with you the day of surgery.    As of today, STOP taking any Aspirin (unless otherwise instructed by your surgeon) Aleve, Naproxen, Ibuprofen, Motrin, Advil, Goody's, BC's, all herbal medications, fish oil, and all vitamins.    The day of surgery:         Do not wear jewelry or makeup. Do not wear lotions, powders, perfume, or deodorant. Do not shave 48 hours prior to surgery. Do not bring valuables to the hospital. Do not wear nail polish, gel polish, artificial nails, or any other type of covering on natural nails (fingers and toes) If you have artificial nails or gel coating that need to  be removed by a nail salon, please have this removed prior to surgery. Artificial nails or gel coating may interfere with anesthesia's ability to adequately monitor your vital signs.  [facility] is not responsible for any belongings or valuables.    Do NOT Smoke (Tobacco/Vaping)  24 hours prior to your procedure  If you use a CPAP at night, you may bring your mask for your overnight stay.   Contacts, glasses, hearing aids, dentures or partials may not be worn into surgery, please bring cases for these belongings   For patients admitted to the hospital, discharge time will be determined by your treatment team.   Patients discharged the day of surgery will not be allowed to drive home, and someone needs to stay with them for 24 hours.   SURGICAL WAITING ROOM VISITATION Patients having surgery or a procedure may have no more than 2 support people in the waiting area - these visitors may rotate.   Children under the age of 24 must have an adult with them who is not the patient. If the patient needs to stay at the hospital during part of their recovery, the visitor guidelines for inpatient rooms apply. Pre-op nurse will coordinate an appropriate time for 1 support person to accompany patient in pre-op.  This support person may not rotate.   Please refer to [email or web address] for the visitor guidelines for Inpatients (after your surgery is over and you are in a regular room).    Special instructions:    Oral Hygiene is also important to reduce  your risk of infection.  Remember - BRUSH YOUR TEETH THE MORNING OF SURGERY WITH YOUR REGULAR TOOTHPASTE   [place]- Preparing For Surgery  Before surgery, you can play an important role. Because skin is not sterile, your skin needs to be as free of germs as possible. You can reduce the number of germs on your skin by washing with CHG (chlorahexidine gluconate) Soap before surgery.  CHG is an  antiseptic cleaner which kills germs and bonds with the skin to continue killing germs even after washing.     Please do not use if you have an allergy to CHG or antibacterial soaps. If your skin becomes reddened/irritated stop using the CHG.  Do not shave (including legs and underarms) for at least 48 hours prior to first CHG shower. It is OK to shave your face.  Please follow these instructions carefully.     Shower the NIGHT BEFORE SURGERY and the MORNING OF SURGERY with CHG Soap.   If you chose to wash your hair, wash your hair first as usual with your normal shampoo. After you shampoo, rinse your hair and body thoroughly to remove the shampoo.  Then [employer] and genitals (private parts) with your normal soap and rinse thoroughly to remove soap.  After that Use CHG Soap as you would any other liquid soap. You can apply CHG directly to the skin and wash gently with a scrungie or a clean washcloth.   Apply the CHG Soap to your body ONLY FROM THE NECK DOWN.  Do not use on open wounds or open sores. Avoid contact with your eyes, ears, mouth and genitals (private parts). Wash Face and genitals (private parts)  with your normal soap.   Wash thoroughly, paying special attention to the area where your surgery will be performed.  Thoroughly rinse your body with warm water from the neck down.  DO NOT shower/wash with your normal soap after using and rinsing off the CHG Soap.  Pat yourself dry with a CLEAN TOWEL.  Wear CLEAN PAJAMAS to bed the night before surgery  Place CLEAN SHEETS on your bed the night before your surgery  DO NOT SLEEP WITH PETS.   Day of Surgery:  Take a shower with CHG soap. Wear Clean/Comfortable clothing the morning of surgery Do not apply any deodorants/lotions.   Remember to brush your teeth WITH YOUR REGULAR TOOTHPASTE.    If you received a COVID test during your pre-op visit, it is requested that you wear a mask when out in public, stay away from anyone  that may not be feeling well, and notify your surgeon if you develop symptoms. If you have been in contact with anyone that has tested positive in the last 10 days, please notify your surgeon.    Please read over the following fact sheets that you were given.

## 2022-12-29 ENCOUNTER — Inpatient Hospital Stay (HOSPITAL_COMMUNITY)
Admission: RE | Admit: 2022-12-29 | Discharge: 2022-12-29 | Disposition: A | Discharge status: Home / Self Care | Payer: Medicaid Other | Source: Ambulatory Visit

## 2022-12-30 NOTE — Progress Notes (Signed)
Normal mammogram. Follow up in 1 year.

## 2023-01-03 ENCOUNTER — Encounter: Payer: Self-pay | Admitting: Sports Medicine

## 2023-01-04 ENCOUNTER — Encounter (HOSPITAL_COMMUNITY): Payer: Self-pay

## 2023-01-04 ENCOUNTER — Encounter (HOSPITAL_COMMUNITY)
Admission: RE | Admit: 2023-01-04 | Discharge: 2023-01-04 | Disposition: A | Discharge status: Home / Self Care | Payer: Medicaid Other | Source: Ambulatory Visit | Attending: Orthopedic Surgery | Admitting: Orthopedic Surgery

## 2023-01-04 ENCOUNTER — Other Ambulatory Visit: Payer: Self-pay

## 2023-01-04 VITALS — BP 112/66 | HR 98 | Temp 98.1°F | Resp 18 | Ht 63.0 in | Wt 145.5 lb

## 2023-01-04 DIAGNOSIS — Z01810 Encounter for preprocedural cardiovascular examination: Secondary | ICD-10-CM | POA: Diagnosis not present

## 2023-01-04 DIAGNOSIS — Z01812 Encounter for preprocedural laboratory examination: Secondary | ICD-10-CM | POA: Insufficient documentation

## 2023-01-04 DIAGNOSIS — Z01818 Encounter for other preprocedural examination: Secondary | ICD-10-CM

## 2023-01-04 LAB — SURGICAL PCR SCREEN
MRSA, PCR: NEGATIVE
Staphylococcus aureus: NEGATIVE

## 2023-01-04 LAB — CBC
HCT: 45.7 % (ref 36.0–46.0)
Hemoglobin: 15.2 g/dL — ABNORMAL HIGH (ref 12.0–15.0)
MCH: 30.8 pg (ref 26.0–34.0)
MCHC: 33.3 g/dL (ref 30.0–36.0)
MCV: 92.5 fL (ref 80.0–100.0)
Platelets: 319 10*3/uL (ref 150–400)
RBC: 4.94 MIL/uL (ref 3.87–5.11)
RDW: 12 % (ref 11.5–15.5)
WBC: 9 10*3/uL (ref 4.0–10.5)
nRBC: 0 % (ref 0.0–0.2)

## 2023-01-04 LAB — BASIC METABOLIC PANEL
Anion gap: 8 (ref 5–15)
BUN: 14 mg/dL (ref 6–20)
CO2: 26 mmol/L (ref 22–32)
Calcium: 9.4 mg/dL (ref 8.9–10.3)
Chloride: 103 mmol/L (ref 98–111)
Creatinine, Ser: 0.98 mg/dL (ref 0.44–1.00)
GFR, Estimated: >60 mL/min (ref >60)
Glucose, Bld: 95 mg/dL (ref 70–99)
Potassium: 3.8 mmol/L (ref 3.5–5.1)
Sodium: 137 mmol/L (ref 135–145)

## 2023-01-04 NOTE — Progress Notes (Signed)
PCP - Tandy Gaw Cardiologist - Denies  PPM/ICD - denies Device Orders - n/a Rep Notified - n/a  Chest x-ray - 09-29-22 EKG - 01-04-23 Stress Test - denies ECHO - denies Cardiac Cath - denies  Sleep Study -  Tested 5+years ago states "everything was fine" CPAP - denies  DM- denies  Blood Thinner Instructions:denies Aspirin Instructions:n/a  ERAS Protcol ERAS no drink   COVID TEST- n/a   Anesthesia review:   Patient denies shortness of breath, fever, cough and chest pain at PAT appointment   All instructions explained to the patient, with a verbal understanding of the material. Patient agrees to go over the instructions while at home for a better understanding. Patient also instructed to self quarantine after being tested for COVID-19. The opportunity to ask questions was provided.

## 2023-01-10 ENCOUNTER — Other Ambulatory Visit: Payer: Self-pay | Admitting: Physician Assistant

## 2023-01-10 DIAGNOSIS — I10 Essential (primary) hypertension: Secondary | ICD-10-CM

## 2023-01-10 DIAGNOSIS — M7989 Other specified soft tissue disorders: Secondary | ICD-10-CM

## 2023-01-12 ENCOUNTER — Encounter: Payer: Self-pay | Admitting: Sports Medicine

## 2023-01-13 ENCOUNTER — Encounter: Payer: Self-pay | Admitting: Physician Assistant

## 2023-01-13 NOTE — Progress Notes (Signed)
C3 and 4 is a disc bulge you have a neurosurgery/orthopedic appt already correct? Do you need [place] to send MRI anywhere?

## 2023-01-19 ENCOUNTER — Encounter (HOSPITAL_COMMUNITY)
Admission: RE | Disposition: A | Discharge status: Home / Self Care | Payer: Self-pay | Source: Home / Self Care | Attending: Orthopedic Surgery

## 2023-01-19 ENCOUNTER — Ambulatory Visit (HOSPITAL_COMMUNITY)
Admission: RE | Admit: 2023-01-19 | Discharge: 2023-01-19 | Disposition: A | Discharge status: Home / Self Care | Payer: Medicaid Other | Attending: Orthopedic Surgery | Admitting: Orthopedic Surgery

## 2023-01-19 ENCOUNTER — Ambulatory Visit (HOSPITAL_BASED_OUTPATIENT_CLINIC_OR_DEPARTMENT_OTHER): Payer: Medicaid Other | Admitting: Physician Assistant

## 2023-01-19 ENCOUNTER — Ambulatory Visit (HOSPITAL_COMMUNITY): Payer: Medicaid Other

## 2023-01-19 ENCOUNTER — Other Ambulatory Visit: Payer: Self-pay

## 2023-01-19 ENCOUNTER — Encounter (HOSPITAL_COMMUNITY): Payer: Self-pay | Admitting: Orthopedic Surgery

## 2023-01-19 ENCOUNTER — Ambulatory Visit (HOSPITAL_COMMUNITY): Payer: Medicaid Other | Admitting: Physician Assistant

## 2023-01-19 DIAGNOSIS — K219 Gastro-esophageal reflux disease without esophagitis: Secondary | ICD-10-CM | POA: Insufficient documentation

## 2023-01-19 DIAGNOSIS — Z87891 Personal history of nicotine dependence: Secondary | ICD-10-CM | POA: Diagnosis not present

## 2023-01-19 DIAGNOSIS — M533 Sacrococcygeal disorders, not elsewhere classified: Secondary | ICD-10-CM | POA: Diagnosis present

## 2023-01-19 DIAGNOSIS — M461 Sacroiliitis, not elsewhere classified: Secondary | ICD-10-CM | POA: Diagnosis not present

## 2023-01-19 HISTORY — PX: SACROILIAC JOINT FUSION: SHX6088

## 2023-01-19 LAB — TYPE AND SCREEN
ABO/RH(D): O POS
Antibody Screen: NEGATIVE

## 2023-01-19 SURGERY — SACROILIAC JOINT FUSION
Anesthesia: General | Site: Pelvis | Laterality: Left

## 2023-01-19 MED ORDER — HYDROMORPHONE HCL 1 MG/ML IJ SOLN
INTRAMUSCULAR | Status: DC | PRN
Start: 2023-01-19 — End: 2023-01-19
  Administered 2023-01-19 (×2): .5 mg via INTRAVENOUS

## 2023-01-19 MED ORDER — ONDANSETRON HCL 4 MG/2ML IJ SOLN: 4.0000 mg | Freq: Once | INTRAMUSCULAR | Status: DC | PRN

## 2023-01-19 MED ORDER — CHLORHEXIDINE GLUCONATE 0.12 % MT SOLN
15.0000 mL | Freq: Once | OROMUCOSAL | Status: AC
  Administered 2023-01-19: 15 mL via OROMUCOSAL
  Filled 2023-01-19: qty 15

## 2023-01-19 MED ORDER — SCOPOLAMINE 1 MG/3DAYS TD PT72
MEDICATED_PATCH | TRANSDERMAL | Status: AC
  Filled 2023-01-19: qty 1

## 2023-01-19 MED ORDER — EPHEDRINE 5 MG/ML INJ
INTRAVENOUS | Status: AC
  Filled 2023-01-19: qty 5

## 2023-01-19 MED ORDER — PROPOFOL 10 MG/ML IV BOLUS
INTRAVENOUS | Status: AC
  Filled 2023-01-19: qty 20

## 2023-01-19 MED ORDER — LIDOCAINE 2% (20 MG/ML) 5 ML SYRINGE
INTRAMUSCULAR | Status: AC
  Filled 2023-01-19: qty 5

## 2023-01-19 MED ORDER — ACETAMINOPHEN 10 MG/ML IV SOLN
INTRAVENOUS | Status: DC | PRN
  Administered 2023-01-19: 1000 mg via INTRAVENOUS

## 2023-01-19 MED ORDER — HYDROMORPHONE HCL 1 MG/ML IJ SOLN
INTRAMUSCULAR | Status: AC
  Filled 2023-01-19: qty 1

## 2023-01-19 MED ORDER — OXYCODONE HCL 5 MG PO TABS
ORAL_TABLET | ORAL | Status: AC
  Filled 2023-01-19: qty 1

## 2023-01-19 MED ORDER — BUPIVACAINE-EPINEPHRINE 0.25% -1:200000 IJ SOLN
INTRAMUSCULAR | Status: DC | PRN
  Administered 2023-01-19: 10 mL

## 2023-01-19 MED ORDER — DEXAMETHASONE SODIUM PHOSPHATE 10 MG/ML IJ SOLN
INTRAMUSCULAR | Status: AC
  Filled 2023-01-19: qty 1

## 2023-01-19 MED ORDER — HYDROMORPHONE HCL 1 MG/ML IJ SOLN
INTRAMUSCULAR | Status: AC
  Filled 2023-01-19: qty 0.5

## 2023-01-19 MED ORDER — MIDAZOLAM HCL 2 MG/2ML IJ SOLN
INTRAMUSCULAR | Status: DC | PRN
  Administered 2023-01-19: 2 mg via INTRAVENOUS

## 2023-01-19 MED ORDER — PHENYLEPHRINE 80 MCG/ML (10ML) SYRINGE FOR IV PUSH (FOR BLOOD PRESSURE SUPPORT)
PREFILLED_SYRINGE | INTRAVENOUS | Status: AC
  Filled 2023-01-19: qty 10

## 2023-01-19 MED ORDER — ONDANSETRON HCL 4 MG/2ML IJ SOLN
INTRAMUSCULAR | Status: DC | PRN
  Administered 2023-01-19: 4 mg via INTRAVENOUS

## 2023-01-19 MED ORDER — ONDANSETRON HCL 4 MG/2ML IJ SOLN
INTRAMUSCULAR | Status: AC
  Filled 2023-01-19: qty 2

## 2023-01-19 MED ORDER — KETAMINE HCL 50 MG/5ML IJ SOSY
PREFILLED_SYRINGE | INTRAMUSCULAR | Status: AC
  Filled 2023-01-19: qty 5

## 2023-01-19 MED ORDER — HYDROMORPHONE HCL 1 MG/ML IJ SOLN
0.2500 mg | INTRAMUSCULAR | Status: DC | PRN
  Administered 2023-01-19 (×2): 0.5 mg via INTRAVENOUS

## 2023-01-19 MED ORDER — BUPIVACAINE LIPOSOME 1.3 % IJ SUSP
INTRAMUSCULAR | Status: AC
  Filled 2023-01-19: qty 20

## 2023-01-19 MED ORDER — SUGAMMADEX SODIUM 200 MG/2ML IV SOLN
INTRAVENOUS | Status: DC | PRN
  Administered 2023-01-19: 275 mg via INTRAVENOUS

## 2023-01-19 MED ORDER — LACTATED RINGERS IV SOLN: INTRAVENOUS | Status: DC

## 2023-01-19 MED ORDER — ROCURONIUM BROMIDE 10 MG/ML (PF) SYRINGE
PREFILLED_SYRINGE | INTRAVENOUS | Status: AC
  Filled 2023-01-19: qty 10

## 2023-01-19 MED ORDER — ACETAMINOPHEN 10 MG/ML IV SOLN: 1000.0000 mg | Freq: Once | INTRAVENOUS | Status: DC | PRN

## 2023-01-19 MED ORDER — 0.9 % SODIUM CHLORIDE (POUR BTL) OPTIME
TOPICAL | Status: DC | PRN
  Administered 2023-01-19: 1000 mL

## 2023-01-19 MED ORDER — BUPIVACAINE-EPINEPHRINE 0.5% -1:200000 IJ SOLN: INTRAMUSCULAR | Status: DC | PRN

## 2023-01-19 MED ORDER — CEFAZOLIN SODIUM-DEXTROSE 2-4 GM/100ML-% IV SOLN
2.0000 g | INTRAVENOUS | Status: AC
  Administered 2023-01-19: 2 g via INTRAVENOUS
  Filled 2023-01-19: qty 100

## 2023-01-19 MED ORDER — MIDAZOLAM HCL 2 MG/2ML IJ SOLN
INTRAMUSCULAR | Status: AC
  Filled 2023-01-19: qty 2

## 2023-01-19 MED ORDER — KETOROLAC TROMETHAMINE 30 MG/ML IJ SOLN
INTRAMUSCULAR | Status: AC
  Filled 2023-01-19: qty 1

## 2023-01-19 MED ORDER — BUPIVACAINE-EPINEPHRINE (PF) 0.25% -1:200000 IJ SOLN
INTRAMUSCULAR | Status: AC
  Filled 2023-01-19: qty 30

## 2023-01-19 MED ORDER — KETAMINE HCL 10 MG/ML IJ SOLN
INTRAMUSCULAR | Status: DC | PRN
Start: 2023-01-19 — End: 2023-01-19
  Administered 2023-01-19: 25 mg via INTRAVENOUS

## 2023-01-19 MED ORDER — LIDOCAINE 2% (20 MG/ML) 5 ML SYRINGE
INTRAMUSCULAR | Status: DC | PRN
  Administered 2023-01-19: 60 mg via INTRAVENOUS

## 2023-01-19 MED ORDER — BUPIVACAINE-EPINEPHRINE (PF) 0.25% -1:200000 IJ SOLN
INTRAMUSCULAR | Status: DC | PRN
  Administered 2023-01-19: 30 mL

## 2023-01-19 MED ORDER — ACETAMINOPHEN 10 MG/ML IV SOLN
INTRAVENOUS | Status: AC
  Filled 2023-01-19: qty 100

## 2023-01-19 MED ORDER — KETOROLAC TROMETHAMINE 30 MG/ML IJ SOLN
INTRAMUSCULAR | Status: DC | PRN
  Administered 2023-01-19: 30 mg via INTRAVENOUS

## 2023-01-19 MED ORDER — OXYCODONE HCL 5 MG PO TABS
5.0000 mg | ORAL_TABLET | Freq: Once | ORAL | Status: AC
  Administered 2023-01-19: 5 mg via ORAL

## 2023-01-19 MED ORDER — PROPOFOL 10 MG/ML IV BOLUS
INTRAVENOUS | Status: DC | PRN
  Administered 2023-01-19: 150 mg via INTRAVENOUS

## 2023-01-19 MED ORDER — ORAL CARE MOUTH RINSE: 15.0000 mL | Freq: Once | OROMUCOSAL | Status: AC

## 2023-01-19 MED ORDER — ROCURONIUM BROMIDE 10 MG/ML (PF) SYRINGE
PREFILLED_SYRINGE | INTRAVENOUS | Status: DC | PRN
  Administered 2023-01-19: 60 mg via INTRAVENOUS

## 2023-01-19 SURGICAL SUPPLY — 59 items
BAG COUNTER SPONGE SURGICOUNT (BAG) ×1 IMPLANT
BAG SPNG CNTER NS LX DISP (BAG)
BLADE CLIPPER SURG (BLADE) IMPLANT
BLADE SURG 11 STRL SS (BLADE) ×1 IMPLANT
CLSR STERI-STRIP ANTIMIC 1/2X4 (GAUZE/BANDAGES/DRESSINGS) ×1 IMPLANT
COVER SURGICAL LIGHT HANDLE (MISCELLANEOUS) ×1 IMPLANT
DRAIN CHANNEL 15F RND FF W/TCR (WOUND CARE) IMPLANT
DRAPE C-ARM 42X72 X-RAY (DRAPES) ×1 IMPLANT
DRAPE C-ARMOR (DRAPES) ×1 IMPLANT
DRAPE SURG 17X23 STRL (DRAPES) ×1 IMPLANT
DRAPE U-SHAPE 47X51 STRL (DRAPES) ×1 IMPLANT
DRIVER NDL LRG 8 DVNC XI (INSTRUMENTS) ×1 IMPLANT
DRIVER NDLE LRG 8 DVNC XI (INSTRUMENTS)
DRSG OPSITE POSTOP 3X4 (GAUZE/BANDAGES/DRESSINGS) IMPLANT
DRSG OPSITE POSTOP 4X6 (GAUZE/BANDAGES/DRESSINGS) IMPLANT
DRSG TEGADERM 2-3/8X2-3/4 SM (GAUZE/BANDAGES/DRESSINGS) IMPLANT
DURAPREP 26ML APPLICATOR (WOUND CARE) ×1 IMPLANT
ELECT BLADE 4.0 EZ CLEAN MEGAD (MISCELLANEOUS) ×1
ELECT PENCIL ROCKER SW 15FT (MISCELLANEOUS) ×1 IMPLANT
ELECT REM PT RETURN 9FT ADLT (ELECTROSURGICAL) ×1
ELECTRODE BLDE 4.0 EZ CLN MEGD (MISCELLANEOUS) ×1 IMPLANT
ELECTRODE REM PT RTRN 9FT ADLT (ELECTROSURGICAL) ×1 IMPLANT
GLOVE BIOGEL PI IND STRL 8.5 (GLOVE) ×1 IMPLANT
GLOVE SS BIOGEL STRL SZ 8.5 (GLOVE) ×1 IMPLANT
GOWN STRL REUS W/ TWL LRG LVL3 (GOWN DISPOSABLE) ×1 IMPLANT
GOWN STRL REUS W/TWL 2XL LVL3 (GOWN DISPOSABLE) ×1 IMPLANT
GOWN STRL REUS W/TWL LRG LVL3 (GOWN DISPOSABLE) ×1
GUIDE PIN IFUSE DISP 3.2 (PIN) ×3
IMPL IFUSE 3D 7X35 (Rod) IMPLANT
IMPLANT IFUSE 3D 7X35 (Rod) ×1 IMPLANT
IMPLANT IFUSE 7.0MMX40MM (Rod) ×2 IMPLANT
KIT BASIN OR (CUSTOM PROCEDURE TRAY) ×1 IMPLANT
KIT TURNOVER KIT B (KITS) ×1 IMPLANT
NDL 22X1.5 STRL (OR ONLY) (MISCELLANEOUS) ×1 IMPLANT
NDL SPNL 22GX3.5 QUINCKE BK (NEEDLE) IMPLANT
NEEDLE 22X1.5 STRL (OR ONLY) (MISCELLANEOUS) ×1
NEEDLE SPNL 22GX3.5 QUINCKE BK (NEEDLE) ×1
NS IRRIG 1000ML POUR BTL (IV SOLUTION) ×1 IMPLANT
PACK LAMINECTOMY ORTHO (CUSTOM PROCEDURE TRAY) ×1 IMPLANT
PACK UNIVERSAL I (CUSTOM PROCEDURE TRAY) ×1 IMPLANT
PAD ARMBOARD 7.5X6 YLW CONV (MISCELLANEOUS) ×2 IMPLANT
PIN BLUNT IFUSE DISP 3.2 (PIN) IMPLANT
PIN EXCHANGE IFUSE DISP 3.2 (PIN) IMPLANT
PIN GUIDE IFUSE DISP 3.2 (PIN) IMPLANT
POSITIONER HEAD PRONE TRACH (MISCELLANEOUS) ×1 IMPLANT
STAPLER VISISTAT 35W (STAPLE) ×1 IMPLANT
STRIP CLOSURE SKIN 1/2X4 (GAUZE/BANDAGES/DRESSINGS) IMPLANT
SUT MNCRL AB 3-0 PS2 18 (SUTURE) ×1 IMPLANT
SUT VIC AB 1 CT1 18XCR BRD 8 (SUTURE) ×1 IMPLANT
SUT VIC AB 1 CT1 8-18 (SUTURE) ×1
SUT VIC AB 2-0 CT1 18 (SUTURE) ×1 IMPLANT
SYR BULB IRRIG 60ML STRL (SYRINGE) ×1 IMPLANT
SYR CONTROL 10ML LL (SYRINGE) ×1 IMPLANT
SYS SPNL FX3ANG 40X7X (Rod) ×2 IMPLANT
SYSTEM SPNL FX3ANG 40X7X (Rod) IMPLANT
TOWEL GREEN STERILE (TOWEL DISPOSABLE) ×1 IMPLANT
TOWEL GREEN STERILE FF (TOWEL DISPOSABLE) ×1 IMPLANT
WATER STERILE IRR 1000ML POUR (IV SOLUTION) ×1 IMPLANT
YANKAUER SUCT BULB TIP NO VENT (SUCTIONS) ×1 IMPLANT

## 2023-01-19 NOTE — H&P (Signed)
History: [name] is a very pleasant 47 yr old woman with significant ongoing left SI joint pain. Despite appropriate conservative management her quality of life is continued to deteriorate. Her clinical exam and imaging studies demonstrate left SI dysfunction. As result of the failure to improve with conservative modalities and confirmation of her pain generator she is increased with with injection therapy we have elected to move forward with an SI joint fusion.  Past Medical History:  Diagnosis Date   Acute medial meniscus tear of right knee    Anxiety    Arthritis    Asthma    Complication of anesthesia    likes iv med and scopolamine patch also, limited neck motion   COVID-19 01/15/2020   antibody infusion given 01-16-2020 no symptoms since infusion   Depression    Dysrhythmia    tachycardia   Elevated heart rate and blood pressure    takes toprol for increased heart rate   Family history of adverse reaction to anesthesia    mother got blood clots after anesthesia , mother on heparin now   GERD (gastroesophageal reflux disease)    Headache(784.0)    migraines   IC (interstitial cystitis)    Insomnia    Myofascial pain syndrome, cervical    Neck pain    C 2 and C 3 slipped disc has neck pain with, limited neck motion   Ovarian cyst    x 2now   Pneumonia    PONV (postoperative nausea and vomiting)     Allergies  Allergen Reactions   Prednisone Other (See Comments)    Pain all over, "it triggers my myofascial pain syndrome"    Zoloft [Sertraline]     Worsened depression.    Amitriptyline Other (See Comments)    Gained weight and does not want to be on.    Hydrocodone-Acetaminophen Itching    Tolerates with benadryl (no reaction to oxycodone)   Lunesta [Eszopiclone] Other (See Comments)    Metallic taste in mouth   Oxycodone-Acetaminophen Swelling and Itching   Tramadol Itching    No current facility-administered medications on file prior to encounter.    Current Outpatient Medications on File Prior to Encounter  Medication Sig Dispense Refill   Albuterol-Budesonide (AIRSUPRA) 90-80 MCG/ACT AERO Inhale 2 puffs into the lungs every 6 (six) hours as needed. 5.9 g 0   clonazePAM (KLONOPIN) 0.5 MG tablet Take 1 tablet (0.5 mg total) by mouth 2 (two) times daily as needed for anxiety. 30 tablet 1   methocarbamol (ROBAXIN) 500 MG tablet Take 500 mg by mouth 3 (three) times daily.     metoprolol succinate (TOPROL-XL) 100 MG 24 hr tablet Take 1 tablet (100 mg total) by mouth daily. (Patient taking differently: Take 100 mg by mouth daily as needed (High Heart Rate).) 90 tablet 1   montelukast (SINGULAIR) 10 MG tablet Take 10 mg by mouth at bedtime as needed (allergies).     pantoprazole (PROTONIX) 40 MG tablet Take 1 tablet (40 mg total) by mouth daily. 90 tablet 1   pregabalin (LYRICA) 200 MG capsule Take 1 capsule (200 mg total) by mouth 3 (three) times daily. (Patient taking differently: Take 200 mg by mouth 2 (two) times daily.) 90 capsule 0   rOPINIRole (REQUIP) 0.5 MG tablet TAKE ONE TO TWO TABLETS BY MOUTH TWICE A DAY. 360 tablet 0   VENTOLIN HFA 108 (90 Base) MCG/ACT inhaler INHALE 2 PUFFS BY MOUTH EVERY 6 HOURS AS NEEDED 18 each 0  zolpidem (AMBIEN) 10 MG tablet Take 1 tablet (10 mg total) by mouth at bedtime. 90 tablet 1   albuterol (PROVENTIL) (2.5 MG/3ML) 0.083% nebulizer solution Take 3 mLs (2.5 mg total) by nebulization every 4 (four) hours as needed for wheezing or shortness of breath (please include nebulizer machine, hoses, and mask if needed.). 3 mL 2   bisacodyl 5 MG EC tablet Take 10 mg by mouth daily as needed for moderate constipation.     methocarbamol (ROBAXIN) 750 MG tablet TAKE 1 TABLET BY MOUTH 3 TIMES DAILY. (Patient not taking: Reported on 12/26/2022) 90 tablet 2   ondansetron (ZOFRAN) 4 MG tablet Take 1 tablet (4 mg total) by mouth every 8 (eight) hours as needed for nausea or vomiting. (Patient taking differently: Take 4 mg by  mouth every 8 (eight) hours as needed for nausea or vomiting. Not taking) 20 tablet 0    Physical Exam: Vitals:   01/19/23 0555  BP: 124/75  Pulse: 85  Resp: 18  Temp: 98.6 F (37 C)  SpO2: 99%   Body mass index is 25.69 kg/m. Clinical exam: [name] is a pleasant individual, who appears younger than their stated age.  She is alert and orientated 3.  No shortness of breath, chest pain.  Abdomen is soft and non-tender, negative loss of bowel and bladder control, no rebound tenderness.  Negative: skin lesions abrasions contusions  Peripheral pulses: 2+ dorsalis pedis/posterior tibialis pulses bilaterally. LE compartments are: soft and nontender.  Gait pattern: Normal  Assistive devices: None  Neuro: 5/5 motor strength in the lower extremity bilaterally. Occasional dysesthetic pains consistent with sciatica in the left lower extremity. Negative Babinski test, negative nerve root tension signs in the lower extremity. Symmetrical 1+ deep tendon reflexes.  Musculoskeletal: She has pain and tenderness over the left SI joint with direct palpation. Positive FABER test. Positive Gleeson's test, positive compression test. Mild midline lumbar back pain.  Imaging: X-rays of the lumbar spine are essentially unremarkable. There is no scoliosis or spondylolisthesis. No significant collapse of the disc spaces.  Patient had a left SI injection on 12/22/2021 with 100% relief of her posterior left SI pain for approximately 3 to 4 weeks.  She report significant improvement of approximately 80% with left SI injection (03/10/22). Unfortunately it only last for 2 to 3 days.  Patient reports greater than 80% improvement with most recent SI injection on 03/30/2022.  CT scan of the pelvis: completed on 07/04/2022. No gross abnormality is noted. There are degenerative changes within the sacroiliac joint. No fractures seen.  Lumbar MRI: completed on 06/29/2022. Unchanged mild degenerative changes L3-5.  No stenosis or nerve root compression. No disc herniation is seen.  Image: MR CERVICAL SPINE WO CONTRAST  Result Date: 01/13/2023 CLINICAL DATA:  Neck pain radiating down the right shoulder and right arm EXAM: MRI CERVICAL SPINE WITHOUT CONTRAST TECHNIQUE: Multiplanar, multisequence MR imaging of the cervical spine was performed. No intravenous contrast was administered. COMPARISON:  06/26/2017, 08/11/2021 FINDINGS: Alignment: Physiologic. Vertebrae: No acute fracture, evidence of discitis, or aggressive bone lesion. Cord: Normal signal and morphology. Posterior Fossa, vertebral arteries, paraspinal tissues: Posterior fossa demonstrates no focal abnormality. Vertebral artery flow voids are maintained. Paraspinal soft tissues are unremarkable. Disc levels: Discs: Anterior cervical fusion from C5 through C7. Mild degenerative disease with disc height loss at C3-4 C4-5. C2-3: No disc protrusion. Mild left facet arthropathy. Moderate bilateral foraminal stenosis. No spinal stenosis. C3-4: Mild broad-based disc bulge. Bilateral mild facet arthropathy and bilateral uncovertebral degenerative changes.  Moderate-severe right foraminal stenosis. Moderate-severe left foraminal stenosis. Mild spinal stenosis. C4-5: No significant disc bulge. No neural foraminal stenosis. No central canal stenosis. C5-6: Interbody fusion. No spinal stenosis. Mild bilateral foraminal stenosis. C6-7: Interbody fusion.  No foraminal or central canal stenosis. C7-T1: No significant disc bulge. No neural foraminal stenosis. No central canal stenosis. IMPRESSION: 1. Anterior cervical fusion from C5 through C7. Mild bilateral foraminal narrowing at C5-6. No spinal stenosis. 2. At C3-4 there is a mild broad-based disc bulge. Bilateral mild facet arthropathy and bilateral uncovertebral degenerative changes. Moderate-severe bilateral foraminal stenosis. Mild spinal stenosis. 3. At C2-3 there is mild left facet arthropathy. Moderate bilateral  foraminal stenosis. Electronically Signed   By: Elige Ko M.D.   On: 01/13/2023 09:07   MM 3D SCREENING MAMMOGRAM BILATERAL BREAST  Result Date: 12/29/2022 CLINICAL DATA:  Screening. EXAM: DIGITAL SCREENING BILATERAL MAMMOGRAM WITH TOMOSYNTHESIS AND CAD TECHNIQUE: Bilateral screening digital craniocaudal and mediolateral oblique mammograms were obtained. Bilateral screening digital breast tomosynthesis was performed. The images were evaluated with computer-aided detection. COMPARISON:  Previous exam(s). ACR Breast Density Category c: The breasts are heterogeneously dense, which may obscure small masses. FINDINGS: There are no findings suspicious for malignancy. IMPRESSION: No mammographic evidence of malignancy. A result letter of this screening mammogram will be mailed directly to the patient. RECOMMENDATION: Screening mammogram in one year. (Code:SM-B-01Y) BI-RADS CATEGORY  1: Negative. Electronically Signed   By: Sherron Ales M.D.   On: 12/29/2022 17:11    A/P: [name] is a very pleasant 47 yr old woman with significant ongoing left SI joint pain. Despite appropriate conservative management her quality of life is continued to deteriorate. Her clinical exam and imaging studies demonstrate left SI dysfunction. As result of the failure to improve with conservative modalities and confirmation of her pain generator she is increased with with injection therapy we have elected to move forward with an SI joint fusion.  I gone over the surgical procedure in great detail with the patient and answered all of her questions. We have also reviewed the risks and benefits. Risks and benefits of surgery were discussed with the patient. These include: Infection, bleeding, death, stroke, paralysis, ongoing or worse pain, need for additional surgery, nonunion, mal-position of the screws. Also risk of nerve damage, injury to the bowel and bladder, and deep venous thrombosis (blood clots).

## 2023-01-19 NOTE — Anesthesia Procedure Notes (Signed)
Procedure Name: Intubation Date/Time: 01/19/2023 7:56 AM  Performed by: Earlene Plater, CRNAPre-anesthesia Checklist: Emergency Drugs available, Suction available, Patient identified, Patient being monitored and Timeout performed Patient Re-evaluated:Patient Re-evaluated prior to induction Oxygen Delivery Method: Circle system utilized Preoxygenation: Pre-oxygenation with 100% oxygen Induction Type: IV induction Laryngoscope Size: Mac and 3 Grade View: Grade I Tube type: Oral Number of attempts: 1 Airway Equipment and Method: Stylet and Bite block Placement Confirmation: ETT inserted through vocal cords under direct vision, positive ETCO2, breath sounds checked- equal and bilateral and CO2 detector Secured at: 20 cm Tube secured with: Tape Dental Injury: Teeth and Oropharynx as per pre-operative assessment

## 2023-01-19 NOTE — Anesthesia Preprocedure Evaluation (Signed)
Anesthesia Evaluation  Patient identified by MRN, date of birth, ID band Patient awake    Reviewed: Allergy & Precautions, H&P , NPO status , Patient's Chart, lab work & pertinent test results  History of Anesthesia Complications (+) PONV and history of anesthetic complications  Airway Mallampati: II  TM Distance: >3 FB Neck ROM: Full    Dental no notable dental hx.    Pulmonary neg pulmonary ROS, former smoker   Pulmonary exam normal breath sounds clear to auscultation       Cardiovascular hypertension, Normal cardiovascular exam Rhythm:Regular Rate:Normal     Neuro/Psych negative neurological ROS  negative psych ROS   GI/Hepatic Neg liver ROS,GERD  ,,  Endo/Other  negative endocrine ROS    Renal/GU negative Renal ROS  negative genitourinary   Musculoskeletal negative musculoskeletal ROS (+)    Abdominal   Peds negative pediatric ROS (+)  Hematology negative hematology ROS (+)   Anesthesia Other Findings   Reproductive/Obstetrics negative OB ROS                             Anesthesia Physical Anesthesia Plan  ASA: 2  Anesthesia Plan: General   Post-op Pain Management: Toradol IV (intra-op)*   Induction: Intravenous  PONV Risk Score and Plan: Ondansetron, Dexamethasone, Droperidol, Midazolam and Treatment may vary due to age or medical condition  Airway Management Planned: Oral ETT  Additional Equipment:   Intra-op Plan:   Post-operative Plan: Extubation in OR  Informed Consent: I have reviewed the patients History and Physical, chart, labs and discussed the procedure including the risks, benefits and alternatives for the proposed anesthesia with the patient or authorized representative who has indicated his/her understanding and acceptance.     Dental advisory given  Plan Discussed with: CRNA and Surgeon  Anesthesia Plan Comments:        Anesthesia Quick  Evaluation

## 2023-01-19 NOTE — Progress Notes (Signed)
Orthopedic Tech Progress Note Patient Details:  [name] Sep 16, 1975 [id number]  Pt has used crutches multiple times before and states she is familiar with proper use.   Ortho Devices Type of Ortho Device: Crutches Ortho Device/Splint Location: adjusted with pt in PACU 7 Ortho Device/Splint Interventions: Ordered, Adjustment   Post Interventions Instructions Provided: Poper ambulation with device, Care of device, Adjustment of device  Giavanni Zeitlin Carmine Savoy 01/19/2023, 2:05 PM

## 2023-01-19 NOTE — Anesthesia Postprocedure Evaluation (Signed)
Anesthesia Post Note  Patient: [name]  Procedure(s) Performed: LEFT SACROILIAC JOINT FUSION (Left: Pelvis)     Patient location during evaluation: PACU Anesthesia Type: General Level of consciousness: awake and alert Pain management: pain level controlled Vital Signs Assessment: post-procedure vital signs reviewed and stable Respiratory status: spontaneous breathing, nonlabored ventilation, respiratory function stable and patient connected to nasal cannula oxygen Cardiovascular status: blood pressure returned to baseline and stable Postop Assessment: no apparent nausea or vomiting Anesthetic complications: no  No notable events documented.  Last Vitals:  Vitals:   01/19/23 1030 01/19/23 1034  BP: 124/76   Pulse: 83 85  Resp: 18 19  Temp: (!) 36.3 C   SpO2: 96% 97%    Last Pain:  Vitals:   01/19/23 1030  TempSrc:   PainSc: 3                  Peyson Postema S

## 2023-01-19 NOTE — Transfer of Care (Signed)
Immediate Anesthesia Transfer of Care Note  Patient: [name]  Procedure(s) Performed: LEFT SACROILIAC JOINT FUSION (Left: Pelvis)  Patient Location: PACU  Anesthesia Type:General  Level of Consciousness: drowsy, patient cooperative, and responds to stimulation  Airway & Oxygen Therapy: Patient Spontanous Breathing and Patient connected to face mask oxygen  Post-op Assessment: Report given to RN and Post -op Vital signs reviewed and stable  Post vital signs: Reviewed and stable  Last Vitals:  Vitals Value Taken Time  BP 132/91 01/19/23 0918  Temp    Pulse 111 01/19/23 0920  Resp 15 01/19/23 0920  SpO2 99 % 01/19/23 0920  Vitals shown include unfiled device data.    Patients Stated Pain Goal: 0 (01/19/23 0602)  Complications: No notable events documented.

## 2023-01-19 NOTE — Brief Op Note (Signed)
01/19/2023  9:26 AM  PATIENT:  [name]  47 y.o. female  PRE-OPERATIVE DIAGNOSIS:  Left sacroiliac dysfunction  POST-OPERATIVE DIAGNOSIS:  Left sacroiliac dysfunction  PROCEDURE:  Procedure(s) with comments: LEFT SACROILIAC JOINT FUSION (Left) - 1.5 hrs  SURGEON:  Surgeons and Role:    Venita Lick, MD - Primary  PHYSICIAN ASSISTANT:   ASSISTANTS: none   ANESTHESIA:   general  EBL:  20 mL   BLOOD ADMINISTERED:none  DRAINS: none   LOCAL MEDICATIONS USED:  MARCAINE    and OTHER exparel  SPECIMEN:  No Specimen  DISPOSITION OF SPECIMEN:  N/A  COUNTS:  YES  TOURNIQUET:  * No tourniquets in log *  DICTATION: .Dragon Dictation  PLAN OF CARE: Discharge to home after PACU  PATIENT DISPOSITION:  PACU - hemodynamically stable.

## 2023-01-19 NOTE — Discharge Instructions (Signed)
SI Fusion, Care After This sheet gives you information about how to care for yourself after your procedure. Your health care provider may also give you more specific instructions. If you have problems or questions, contact your health care provider.  What can I expect after the procedure? After the procedure, it is common to have: Some pain around your incision area. Muscle tightening (spasms) across the back.   Follow these instructions at home: Incision care Follow instructions from your health care provider about how to take care of your incision area. Make sure you: Wash your hands with soap and water before and after you apply medicine to the area or change your bandage (dressing). If soap and water are not available, use hand sanitizer. Change your dressing as told by your health care provider. Leave stitches (sutures), skin glue, or adhesive strips in place. These skin closures may need to stay in place for 2 weeks or longer. If adhesive strip edges start to loosen and curl up, you may trim the loose edges/apply tape. Do not remove adhesive strips completely unless your health care provider tells you to do that. Do not cut suture knots.  Check your incision area every day for signs of infection. Notify your health care provider if you see any of the following. Check for: More redness, swelling, or pain. More fluid or blood. Warmth. Pus or a bad smell. Fever >100.4 degrees F. Medicines Take over-the-counter and prescription medicines only as told by your health care provider. If you were prescribed an antibiotic medicine, use it as told by your health care provider. Do not stop using the antibiotic even if you start to feel better. Pain medications have been electronically prescribed. Notify your pharmacy or health care provider when/if you need a refill.  Bathing Do not take baths, swim, or use a hot tub for 6 weeks, or until your incision has healed completely & you are told it  is OK by your health care provider. Ok to shower in five (5) days after surgery. Let water run over incision. Do not scrub or point shower head directly at incision. Do not submerge the incision  Activity Return to your normal activities as told by your health care provider. Ask your health care provider what activities are safe for you. Avoid bending or twisting at your waist. Always bend at your knees. Do not sit for more than 20-30 minutes at a time. Lie down or walk between periods of sitting. Do not lift anything that is heavier than 10 lb (4.5 kg) or the limit that your health care provider tells you, until he or she says that it is safe. Do not drive for 2 weeks after your procedure or for as long as your health care provider tells you.  Do not drive or use heavy machinery while taking prescription pain medicine. DO NOT BEAR WEIGHT ON THE SURGICAL LEG  Crutch Mobility: Non-Weight Bearing    Keep weight off surgical leg. Grasp crutch handles securely. Do NOT lean armpits on crutches. 1.Move both crutches forward and slightly out to sides. 2.Hop up to crutches with non-operative leg. Repeat above sequence for each step taken.  General instructions To prevent or treat constipation while you are taking prescription pain medicine, your health care provider may recommend that you: Drink enough fluid to keep your urine clear or pale yellow. Take over-the-counter or prescription medicines. Eat foods that are high in fiber, such as fresh fruits and vegetables, whole grains, and beans. Limit foods  that are high in fat and processed sugars, such as fried and sweet foods. Keep all follow-up visits as told by your health care provider. This is important.  Contact a health care provider if: You have more redness, swelling, or pain around your incision area. Your incision feels warm to the touch. You have leg swelling, discoloration, tenderness, or pain You have a productive cough You have  a fever >100.4 degrees F You are not able to return to activities or do exercises as told by your health care provider.  Get help right away if: You have: More fluid or blood coming from your incision area. Pus or a bad smell coming from your incision area. Chills or a fever. Episodes of dizziness or fainting while standing. You develop a rash. You develop shortness of breath or you have difficulty breathing. You cannot control when you urinate or have a bowel movement. You become weak. You are not able to use your legs.  Summary After the procedure, it is common to have some pain around your incision area. You may also have muscle tightening (spasms) across the back. Follow instructions from your health care provider about how to care for your incision. Do not lift anything that is heavier than 10 lb (4.5 kg) or the limit that your health care provider tells you, until he or she says that it is safe. Contact your health care provider if you have more redness, swelling, or pain around your incision area or if your incision feels warm to the touch. These can be signs of infection. This information is not intended to replace advice given to you by your health care provider. Make sure you discuss any questions you have with your health care provider.

## 2023-01-19 NOTE — Op Note (Signed)
OPERATIVE REPORT  DATE OF SURGERY: 01/19/2023  PATIENT NAME:  [name] MRN: [id number] DOB: Nov 08, 1975  PCP: Jomarie Longs, PA-C  PRE-OPERATIVE DIAGNOSIS: Left sacroiliac dysfunction  POST-OPERATIVE DIAGNOSIS: Same  PROCEDURE:   Left sacroiliac fusion  SURGEON:  Venita Lick, MD  PHYSICIAN ASSISTANT: None  ANESTHESIA:   General  EBL: Minimal   Complications: None  Implants: iFuse triangular cages.  7.0 x 40 mm x 2.  7.0 x 35 mm x 1.  BRIEF HISTORY: [name] is a 47 y.o. female who has had progressive sacroiliac joint pain.  Injection therapy and physiotherapy along with medication has failed to improve her quality of life.  As result of the failure of appropriate conservative management we elected to move forward with surgery.  All appropriate risks, benefits, and alternatives to surgery were discussed and consent was obtained.  PROCEDURE DETAILS: Patient was brought into the operating room and was properly positioned on the operating room table.  After induction with general anesthesia the patient was endotracheally intubated.  A timeout was taken to confirm all important data: including patient, procedure, and the level. Teds, SCD's were applied.   Patient was turned prone onto a chest and pelvic roll and all bony prominences well-padded.  I then secured the patient to the table after confirming satisfactory alignment of the SI joint in the lateral and inlet/outlet views.  The left sacroiliac region was prepped and draped in a standard fashion.  I then placed the first percutaneous pin on the lateral aspect of the sacrum and confirmed satisfactory position with a lateral view.  I then advanced the pin across the iliac wing and into the sacrum.  I tracked the trajectory using the lateral film.  Once I neared the sacroiliac joint I switched to the inlet view to confirm that it my trajectory was appropriate I advanced into the sacrum and then  switched to the outlet view to make sure that my pin remained lateral to the foramen.  Once I confirm satisfactory positioning of this plane and all 3 views I then proceeded with placing the remaining 2 percutaneous screws.  Using a similar technique I placed a 2 percutaneous pins so that I had 3 pins across the sacroiliac joint.  Once I was satisfied with their trajectory and positioning I then proceeded with placement of the iFuse devices.  I injected the skin with quarter percent Marcaine and then made an incision connecting all 3 guidepins.  I then placed a superior (top) approach across the iliac wing and in to the sacrum.  I then placed a 7.0 x 40 mm iFuse device.  I then remove the guidepin repositioned the trocar over the guidepin of the second pin and placed the same size iFuse device after broaching.  I then placed the final iFuse device on the bottom which was a 7.5 x 35 mm length.  Once all 3 devices were in place I took my final lateral, inlet, and outlet views.  I had satisfactory positioning of the implants in all 3 views.  The wound was now copiously irrigated with normal saline and I used Bovie to obtain hemostasis.  I then closed the deep fascia with interrupted #1 Vicryl sutures followed by interrupted 2-0 Vicryl sutures.  Using a spinal needle I then infiltrated the iliac wing with quarter percent Marcaine mixed with Exparel.  I then infiltrated superficially with the same mixture in order to obtain postoperative analgesia.  3-0 Monocryl was used to close the  skin.  Steri-Strips dry dressing were applied and the patient was extubated transferred the PACU without incident.  The end of the case all needle sponge counts were correct.  There were no adverse intraoperative events.  Venita Lick, MD 01/19/2023 9:18 AM

## 2023-01-20 ENCOUNTER — Encounter (HOSPITAL_COMMUNITY): Payer: Self-pay | Admitting: Orthopedic Surgery

## 2023-01-21 ENCOUNTER — Other Ambulatory Visit: Payer: Self-pay | Admitting: Physician Assistant

## 2023-01-21 DIAGNOSIS — T887XXA Unspecified adverse effect of drug or medicament, initial encounter: Secondary | ICD-10-CM

## 2023-01-26 ENCOUNTER — Ambulatory Visit (HOSPITAL_COMMUNITY)
Admission: RE | Admit: 2023-01-26 | Discharge: 2023-01-26 | Disposition: A | Discharge status: Home / Self Care | Payer: Medicaid Other | Source: Ambulatory Visit | Attending: Vascular Surgery | Admitting: Vascular Surgery

## 2023-01-26 ENCOUNTER — Other Ambulatory Visit (HOSPITAL_COMMUNITY): Payer: Self-pay | Admitting: Orthopedic Surgery

## 2023-01-26 DIAGNOSIS — R52 Pain, unspecified: Secondary | ICD-10-CM

## 2023-01-27 ENCOUNTER — Encounter (HOSPITAL_COMMUNITY): Payer: Self-pay | Admitting: Orthopedic Surgery

## 2023-02-11 ENCOUNTER — Other Ambulatory Visit: Payer: Self-pay | Admitting: Physician Assistant

## 2023-02-11 DIAGNOSIS — G2581 Restless legs syndrome: Secondary | ICD-10-CM

## 2023-02-21 ENCOUNTER — Encounter: Payer: Self-pay | Admitting: Physician Assistant

## 2023-02-21 MED ORDER — CELECOXIB 200 MG PO CAPS
200.0000 mg | ORAL_CAPSULE | Freq: Every day | ORAL | 0 refills | Status: DC
Start: 2023-02-21 — End: 2023-04-17

## 2023-02-21 NOTE — Addendum Note (Signed)
Addended by: Jomarie Longs on: 02/21/2023 03:22 PM   Modules accepted: Orders

## 2023-02-22 ENCOUNTER — Other Ambulatory Visit (HOSPITAL_COMMUNITY): Payer: Self-pay | Admitting: Orthopedic Surgery

## 2023-02-22 DIAGNOSIS — R52 Pain, unspecified: Secondary | ICD-10-CM

## 2023-02-23 ENCOUNTER — Encounter (HOSPITAL_COMMUNITY): Payer: Self-pay

## 2023-02-23 ENCOUNTER — Inpatient Hospital Stay (HOSPITAL_COMMUNITY): Admission: RE | Admit: 2023-02-23 | Payer: Medicaid Other | Source: Ambulatory Visit

## 2023-02-28 ENCOUNTER — Ambulatory Visit (HOSPITAL_COMMUNITY): Admission: RE | Admit: 2023-02-28 | Payer: Medicaid Other | Source: Ambulatory Visit

## 2023-03-03 ENCOUNTER — Ambulatory Visit (HOSPITAL_COMMUNITY)
Admission: RE | Admit: 2023-03-03 | Discharge: 2023-03-03 | Disposition: A | Discharge status: Home / Self Care | Payer: Medicaid Other | Source: Ambulatory Visit | Attending: Orthopedic Surgery | Admitting: Orthopedic Surgery

## 2023-03-03 DIAGNOSIS — R52 Pain, unspecified: Secondary | ICD-10-CM | POA: Insufficient documentation

## 2023-03-06 ENCOUNTER — Encounter: Payer: Self-pay | Admitting: Sports Medicine

## 2023-03-06 ENCOUNTER — Ambulatory Visit (INDEPENDENT_AMBULATORY_CARE_PROVIDER_SITE_OTHER): Payer: Medicaid Other | Admitting: Sports Medicine

## 2023-03-06 ENCOUNTER — Other Ambulatory Visit: Payer: Self-pay | Admitting: Physician Assistant

## 2023-03-06 DIAGNOSIS — Z981 Arthrodesis status: Secondary | ICD-10-CM

## 2023-03-06 DIAGNOSIS — R4586 Emotional lability: Secondary | ICD-10-CM

## 2023-03-06 MED ORDER — CYCLOBENZAPRINE HCL 10 MG PO TABS: ORAL_TABLET | ORAL | 0 refills | Status: DC

## 2023-03-06 NOTE — Assessment & Plan Note (Signed)
Pleasant 47 year old female, history of C5-C7 fusion, now having increasing pain in neck, trapezius on the right with radiation down the right arm. She did see her neurosurgeon who suggested an epidural, she is scheduled for this. I am happy to switch her from Robaxin which is not working to [employer]. I also emphasized the importance of physical therapy, so we will get her set up at [facility]. Return to see me in 4 to 6 weeks as needed.

## 2023-03-06 NOTE — Progress Notes (Signed)
    Procedures performed today:    None.  Independent interpretation of notes and tests performed by another provider:   None.  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 She did see her neurosurgeon who suggested an epidural, she is scheduled for this. I am happy to switch her from Robaxin which is not working to [employer]. I also emphasized the importance of physical therapy, so we will get her set up at [facility]. Return to see me in 4 to 6 weeks as needed.    ____________________________________________ Ihor Austin. Benjamin Stain, M.D., ABFM., CAQSM., AME. Primary Care and Sports Medicine [facility]  Adjunct Professor of Family Medicine  [employer]  Restaurant manager, [employer]

## 2023-03-08 ENCOUNTER — Encounter: Payer: Self-pay | Admitting: Sports Medicine

## 2023-03-08 ENCOUNTER — Other Ambulatory Visit: Payer: Self-pay | Admitting: Physician Assistant

## 2023-03-08 DIAGNOSIS — R4586 Emotional lability: Secondary | ICD-10-CM

## 2023-03-08 MED ORDER — QUETIAPINE FUMARATE 100 MG PO TABS: ORAL_TABLET | ORAL | 0 refills | Status: DC

## 2023-03-13 ENCOUNTER — Ambulatory Visit: Payer: Self-pay | Admitting: Sports Medicine

## 2023-03-15 ENCOUNTER — Telehealth (INDEPENDENT_AMBULATORY_CARE_PROVIDER_SITE_OTHER): Payer: Medicaid Other | Admitting: Medical-Surgical

## 2023-03-15 ENCOUNTER — Encounter: Payer: Self-pay | Admitting: Medical-Surgical

## 2023-03-15 DIAGNOSIS — J40 Bronchitis, not specified as acute or chronic: Secondary | ICD-10-CM

## 2023-03-15 DIAGNOSIS — J329 Chronic sinusitis, unspecified: Secondary | ICD-10-CM | POA: Diagnosis not present

## 2023-03-15 DIAGNOSIS — Z87891 Personal history of nicotine dependence: Secondary | ICD-10-CM

## 2023-03-15 MED ORDER — AZITHROMYCIN 250 MG PO TABS: ORAL_TABLET | ORAL | 0 refills | Status: AC

## 2023-03-15 MED ORDER — PROMETHAZINE-DM 6.25-15 MG/5ML PO SYRP: 5.0000 mL | ORAL_SOLUTION | Freq: Four times a day (QID) | ORAL | 0 refills | Status: DC | PRN

## 2023-03-15 NOTE — Progress Notes (Signed)
Virtual Visit via Video Note  I connected with [name] on 03/15/23 at  8:50 AM EST by a video enabled telemedicine application and verified that I am speaking with the correct person using two identifiers.   I discussed the limitations of evaluation and management by telemedicine and the availability of in person appointments. The patient expressed understanding and agreed to proceed.  Patient location: home Provider locations: office  Subjective:    CC: URI symptoms  HPI: Pleasant 47 year old female presenting via MyChart video visit with reports of approximately 6 days of upper respiratory symptoms including subjective fever, chills, cough, sinus congestion, headache, sore throat, and intermittent nausea.  Has not been coughing anything up but reports that she feels mucus that is very thick and difficult to move.  Has tried NyQuil without benefit.  Notes that her son was sick last week and her daughter is now also exhibiting symptoms.  Did not test for COVID or flu.  Past medical history, Surgical history, Family history not pertinant except as noted below, Social history, Allergies, and medications have been entered into the medical record, reviewed, and corrections made.   Review of Systems: See HPI for pertinent positives and negatives.   Objective:    General: Speaking clearly in complete sentences without any shortness of breath.  Alert and oriented x3.  Normal judgment. No apparent acute distress.  Impression and Recommendations:    1. Sinobronchitis With 6 days of symptoms, deferring flu and COVID testing.  Discussed the etiology of her symptoms.  This is likely viral as it is making its way through the household however she does note improvement in her symptoms with recent sickening over the last 24 hours.  Plan to go ahead and treat for bacterial infection with azithromycin.  Adding Promethazine DM for management of cough.  If no improvement with the antibiotic,  may need to consider a steroid taper however holding off on this today. - promethazine-dextromethorphan (PROMETHAZINE-DM) 6.25-15 MG/5ML syrup; Take 5 mLs by mouth 4 (four) times daily as needed for cough.  Dispense: 118 mL; Refill: 0 - azithromycin (ZITHROMAX) 250 MG tablet; Take 2 tablets on day 1, then 1 tablet daily on days 2 through 5  Dispense: 6 tablet; Refill: 0  I discussed the assessment and treatment plan with the patient. The patient was provided an opportunity to ask questions and all were answered. The patient agreed with the plan and demonstrated an understanding of the instructions.   The patient was advised to call back or seek an in-person evaluation if the symptoms worsen or if the condition fails to improve as anticipated.  Return if symptoms worsen or fail to improve.  Thayer Ohm, DNP, APRN, FNP-BC [facility]

## 2023-03-17 ENCOUNTER — Encounter: Payer: Self-pay | Admitting: Physician Assistant

## 2023-03-17 MED ORDER — BENZONATATE 200 MG PO CAPS: 200.0000 mg | ORAL_CAPSULE | Freq: Three times a day (TID) | ORAL | 0 refills | Status: DC | PRN

## 2023-03-21 ENCOUNTER — Encounter: Payer: Self-pay | Admitting: Physician Assistant

## 2023-03-21 DIAGNOSIS — J329 Chronic sinusitis, unspecified: Secondary | ICD-10-CM

## 2023-03-22 MED ORDER — PROMETHAZINE-DM 6.25-15 MG/5ML PO SYRP: 5.0000 mL | ORAL_SOLUTION | Freq: Four times a day (QID) | ORAL | 0 refills | Status: DC | PRN

## 2023-03-22 NOTE — Addendum Note (Signed)
Addended by: Jomarie Longs on: 03/22/2023 08:56 AM   Modules accepted: Orders

## 2023-03-24 ENCOUNTER — Encounter: Payer: Self-pay | Admitting: Physician Assistant

## 2023-03-24 ENCOUNTER — Other Ambulatory Visit: Payer: Self-pay | Admitting: Physician Assistant

## 2023-03-24 DIAGNOSIS — N951 Menopausal and female climacteric states: Secondary | ICD-10-CM | POA: Insufficient documentation

## 2023-03-24 DIAGNOSIS — F4322 Adjustment disorder with anxiety: Secondary | ICD-10-CM

## 2023-03-24 DIAGNOSIS — F411 Generalized anxiety disorder: Secondary | ICD-10-CM

## 2023-03-24 MED ORDER — VEOZAH 45 MG PO TABS: 1.0000 | ORAL_TABLET | Freq: Every day | ORAL | 11 refills | Status: DC

## 2023-03-24 MED ORDER — PREGABALIN 200 MG PO CAPS: 200.0000 mg | ORAL_CAPSULE | Freq: Three times a day (TID) | ORAL | 4 refills | Status: DC

## 2023-03-27 ENCOUNTER — Telehealth: Payer: Self-pay

## 2023-03-27 NOTE — Telephone Encounter (Signed)
Initiated Prior authorization [id number] 1MG  tablets Via: Covermymeds Case/Key:[id number] Status: n/a as of 03/27/23 Reason: drug covered  Notified Pt via: Mychart

## 2023-03-29 ENCOUNTER — Other Ambulatory Visit: Payer: Self-pay | Admitting: Physician Assistant

## 2023-03-29 MED ORDER — CLONAZEPAM 0.5 MG PO TABS: 0.5000 mg | ORAL_TABLET | Freq: Two times a day (BID) | ORAL | 0 refills | Status: DC | PRN

## 2023-03-29 NOTE — Addendum Note (Signed)
Addended by: Jomarie Longs on: 03/29/2023 03:49 PM   Modules accepted: Orders

## 2023-04-11 ENCOUNTER — Encounter: Payer: Self-pay | Admitting: Physical Therapy

## 2023-04-11 ENCOUNTER — Other Ambulatory Visit: Payer: Self-pay

## 2023-04-11 ENCOUNTER — Ambulatory Visit: Payer: Medicaid Other | Attending: Sports Medicine | Admitting: Physical Therapy

## 2023-04-11 DIAGNOSIS — Z981 Arthrodesis status: Secondary | ICD-10-CM | POA: Diagnosis not present

## 2023-04-11 DIAGNOSIS — M5459 Other low back pain: Secondary | ICD-10-CM | POA: Diagnosis present

## 2023-04-11 DIAGNOSIS — M6281 Muscle weakness (generalized): Secondary | ICD-10-CM | POA: Insufficient documentation

## 2023-04-11 DIAGNOSIS — M542 Cervicalgia: Secondary | ICD-10-CM | POA: Diagnosis present

## 2023-04-11 NOTE — Therapy (Unsigned)
OUTPATIENT PHYSICAL THERAPY CERVICAL EVALUATION   Patient Name: [name] MRN: [id number] DOB:03-07-1976, 47 y.o., female Today's Date: 04/11/2023  END OF SESSION:  PT End of Session - 04/11/23 1701     Visit Number 1    Number of Visits 16    Date for PT Re-Evaluation 06/06/23    Authorization Type Medicaid    PT Start Time 1615    PT Stop Time 1700    PT Time Calculation (min) 45 min    Activity Tolerance Patient tolerated treatment well    Behavior During Therapy [facility] for tasks assessed/performed             Past Medical History:  Diagnosis Date   Acute medial meniscus tear of right knee    Anxiety    Arthritis    Asthma    Complication of anesthesia    likes iv med and scopolamine patch also, limited neck motion   COVID-19 01/15/2020   antibody infusion given 01-16-2020 no symptoms since infusion   Depression    Dysrhythmia    tachycardia   Elevated heart rate and blood pressure    takes toprol for increased heart rate   Family history of adverse reaction to anesthesia    mother got blood clots after anesthesia , mother on heparin now   GERD (gastroesophageal reflux disease)    Headache(784.0)    migraines   IC (interstitial cystitis)    Insomnia    Myofascial pain syndrome, cervical    Neck pain    C 2 and C 3 slipped disc has neck pain with, limited neck motion   Ovarian cyst    x 2now   Pneumonia    PONV (postoperative nausea and vomiting)    Past Surgical History:  Procedure Laterality Date   ablation of nerves in back      CARPAL TUNNEL RELEASE Left 06/05/2014   Procedure: LEFT CARPAL TUNNEL RELEASE;  Surgeon: Cindee Salt, MD;  Location: [facility];  Service: Orthopedics;  Laterality: Left;   CARPAL TUNNEL RELEASE Right 08/25/2014   Procedure: RIGHT CARPAL TUNNEL RELEASE;  Surgeon: Cindee Salt, MD;  Location:  SURGERY CENTER;  Service: Orthopedics;  Laterality: Right;   CARPAL TUNNEL RELEASE Left 07/14/2015    Procedure: LEFT CARPAL TUNNEL RELEASE;  Surgeon: Cindee Salt, MD;  Location:  SURGERY CENTER;  Service: Orthopedics;  Laterality: Left;   CERVICAL SPINE SURGERY  11/2013   dr Jeral Fruit   CESAREAN SECTION     x2   DILATION AND EVACUATION N/A 05/07/2013   Procedure: DILATATION AND EVACUATION with tissue sent for chromosome analysis;  Surgeon: Lenoard Aden, MD;  Location: [facility];  Service: Gynecology;  Laterality: N/A;   EPIDURAL STEROID INJECTION  03/20/2015   KNEE ARTHROSCOPY WITH MEDIAL MENISECTOMY Right 04/09/2019   Procedure: Right knee arthroscopy fat pad resection with partial medial meniscectomy;  Surgeon: Yolonda Kida, MD;  Location: [facility];  Service: Orthopedics;  Laterality: Right;  60 mins   KNEE ARTHROSCOPY WITH MEDIAL MENISECTOMY Right 12/31/2021   Procedure: KNEE ARTHROSCOPY WITH PARTIAL  MEDIAL MENISECTOMY;  Surgeon: Yolonda Kida, MD;  Location: [facility];  Service: Orthopedics;  Laterality: Right;   KNEE ARTHROSCOPY WITH SUBCHONDROPLASTY Right 02/04/2020   Procedure: Right knee arthroscopy with partial medial meniscectomy with lateral tibia subchondroplasty;  Surgeon: Yolonda Kida, MD;  Location: [facility];  Service: Orthopedics;  Laterality: Right;  75 mins   KNEE ARTHROSCOPY WITH  SUBCHONDROPLASTY Right 12/31/2021   Procedure: KNEE ARTHROSCOPY WITH LATERAL TIBIA SUBCHONDROPLASTY;  Surgeon: Yolonda Kida, MD;  Location: [facility];  Service: Orthopedics;  Laterality: Right;  75   left bicep tendon repair      left shoulder scope surgery      OVARIAN CYST REMOVAL     PELVIC LAPAROSCOPY  2003   endometriosis   POSTERIOR CERVICAL FUSION/FORAMINOTOMY N/A 10/28/2014   Procedure: Cervical Four-Cervical Seven Posterior cervical fusion with lateral mass fixation;  Surgeon: Tia Alert, MD;  Location: [facility];  Service: Neurosurgery;  Laterality: N/A;  posterior   ROBOTIC ASSISTED LAPAROSCOPIC LYSIS OF  ADHESION N/A 07/18/2014   Procedure: ROBOTIC ASSISTED LAPAROSCOPIC  EXCISION POSTERIOR  UTERINE WALL MASS; EXCISION RIGHT MESSALEINGEAL MASS, EXCISION AND ABLATION CULDASAC ENDOMETRIOSIS;  Surgeon: Olivia Mackie, MD;  Location: [facility];  Service: Gynecology;  Laterality: N/A;   ROBOTIC ASSISTED TOTAL HYSTERECTOMY WITH SALPINGECTOMY Bilateral 05/11/2015   Procedure: ROBOTIC ASSISTED TOTAL HYSTERECTOMY WITH BILATERAL SALPINGECTOMY;  Surgeon: Olivia Mackie, MD;  Location: [facility];  Service: Gynecology;  Laterality: Bilateral;   SACROILIAC JOINT FUSION Left 01/19/2023   Procedure: LEFT SACROILIAC JOINT FUSION;  Surgeon: Venita Lick, MD;  Location: [facility];  Service: Orthopedics;  Laterality: Left;  1.5 hrs   Patient Active Problem List   Diagnosis Date Noted   Menopausal syndrome (hot flashes) 03/24/2023   Mass of skin of finger of right hand 11/21/2022   Tenosynovitis of left hand little finger 11/09/2022   Swelling of fifth toe of left foot 10/26/2022   Sacroiliac joint dysfunction 10/26/2022   Pain in finger of right hand 10/20/2022   Digital mucinous cyst of second DIP of right hand 07/20/2022   Motion sickness 01/11/2022   Osteoarthritis of first metatarsophalangeal (MTP) joint of right foot 10/18/2021   Lateral epicondylitis, right elbow 09/20/2021   Acute foot pain, right 09/20/2021   Baker's cyst of knee, right 05/21/2021   Contusion of hand, left 05/18/2021   Right leg swelling 05/18/2021   Former cigarette smoker 04/20/2021   Pneumonia of both lower lobes due to infectious organism 04/20/2021   Stress at home 12/29/2020   Injury of right knee 11/14/2020   Acute pain of right knee 11/14/2020   Numbness of right great toe 06/23/2020   Intractable right heel pain 05/04/2020   COVID-19 virus infection 01/14/2020   Impingement syndrome, shoulder, left 12/30/2019   Pes anserine bursitis 09/03/2019   Nodule right fifth PIP of hand 09/03/2019   Mood changes 08/25/2019   RLS (restless  legs syndrome) 07/08/2019   Medication management 04/02/2019   Leukocytes in urine 03/04/2019   Other microscopic hematuria 03/04/2019   Left ovarian cyst 03/04/2019   Dog bite 02/15/2019   Mild episode of recurrent major depressive disorder (HCC) 10/30/2018   Traumatic ecchymosis of left foot 10/15/2018   Asthma 01/29/2018   Essential hypertension 10/06/2017   Hx of cold sores 10/06/2017   First degree ankle sprain, left, initial encounter 08/29/2017   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   Chronic pain syndrome 01/08/2017   Trigger thumb, left thumb 10/06/2016   Primary osteoarthritis of right knee with tibial subchondral insufficiency fracture 09/05/2016   Pes anserinus bursitis of right knee 09/05/2016   Balance problems 07/19/2016   Dizziness 07/19/2016   Palpitations 04/14/2016   Primary osteoarthritis of left first metacarpophalangeal joint 04/04/2016   Fixed pupils 08/31/2015   Dysuria 08/31/2015   Depression 08/31/2015   Vasomotor flushing 06/15/2015   GAD (generalized anxiety disorder)  06/15/2015   Therapeutic opioid induced constipation 12/16/2014   S/P cervical spinal fusion with adjacent level disease and right cervical radiculopathy 10/28/2014   Tachycardia 08/13/2014   Endometriosis 07/20/2014   Paresthesia 06/03/2014   Myofascial pain syndrome 05/29/2014   Carpal tunnel syndrome of left wrist 05/05/2014   Left shoulder pain 04/02/2014   Constipation due to opioid therapy 01/17/2014   Weight gain 11/29/2013   Greater trochanteric bursitis 11/29/2013   PTSD (post-traumatic stress disorder) 12/14/2012   Tick-borne disease 08/09/2010   TROCHANTERIC BURSITIS 06/21/2010   Tobacco abuse 02/05/2010   INTERSTITIAL CYSTITIS 02/05/2010   FATIGUE 07/02/2009   Depressed 06/26/2008   Adjustment disorder 07/06/2007   Primary insomnia 07/06/2007   Headache 07/06/2007   Anxiety state 07/06/2007   Headache 07/06/2007    PCP: Caleen Essex  REFERRING PROVIDER: Benjamin Stain  REFERRING  DIAG: cervical radiculopathy, SI jt fusion  THERAPY DIAG:  Cervicalgia  Other low back pain  Muscle weakness (generalized)  Rationale for Evaluation and Treatment: Rehabilitation  ONSET DATE: 01/19/23  SUBJECTIVE:                                                                                                                                                                                                         SUBJECTIVE STATEMENT: Pt s/p SI joint fusion 01/19/23. She was on crutches for 8 weeks. She continues to have in mid/low back on both sides all the time, worse when holding her granddaughter. Back pain eases with meds and heat. Pt also with c/o neck pain that goes into both shoulders. Pain has been occurring x 4 weeks. MD prescribed muscle relaxors which did not help. Pt states neck pain is constant, worse with lifting and neck rotation. Pt states the surgeon suggests another cervical fusion but she wants to try PT first   PERTINENT HISTORY:  Cervical fusion C5-C7 Lt SI jt fusion   PAIN:  Are you having pain? Yes: NPRS scale: 6/10 back and neck/10 Pain location: neck, shoulders, low/mid back Pain description: achey, sharp, sore Aggravating factors: rain, cold, lifting Relieving factors: meds, heat  PRECAUTIONS: None  RED FLAGS: None     WEIGHT BEARING RESTRICTIONS: No  FALLS:  Has patient fallen in last 6 months? No  OCCUPATION: disability  PLOF: Independent  PATIENT GOALS: decrease pain, put off neck surgery  NEXT MD VISIT: 04/17/23  OBJECTIVE:  Note: Objective measures were completed at Evaluation unless otherwise noted.  DIAGNOSTIC FINDINGS:  Cervical MRI: Anterior cervical fusion from C5 through C7. Mild bilateral foraminal narrowing at C5-6. No spinal stenosis. 2.  At C3-4 there is a mild broad-based disc bulge. Bilateral mild facet arthropathy and bilateral uncovertebral degenerative changes. Moderate-severe bilateral foraminal stenosis. Mild spinal  stenosis. 3. At C2-3 there is mild left facet arthropathy. Moderate bilateral foraminal stenosis.  PATIENT SURVEYS:  FOTO 59  COGNITION: Overall cognitive status: Within functional limits for tasks assessed  SENSATION: WFL  POSTURE: rounded shoulders and forward head  PALPATION: Increased muscle spasticity Rt glutes, bilat lumbar paraspinals CPAs and UPAs WFL L1-L4 Increased muscle spasticity Rt > Lt cervical paraspinals, bilat upper traps   CERVICAL ROM:   Active ROM A/PROM (deg) eval  Flexion 45  Extension 30  Right lateral flexion 30  Left lateral flexion 20  Right rotation 28  Left rotation 35   (Blank rows = not tested)  LUMBAR ROM:   Active  A/PROM  eval  Flexion 75% pain  Extension WFL  Right lateral flexion WFL  Left lateral flexion WFL  Right rotation WFL  Left rotation WFL   (Blank rows = not tested)    UPPER EXTREMITY MMT:  MMT Right eval Left eval  Shoulder flexion 3 3  Shoulder extension    Shoulder abduction 3 3  Shoulder adduction    Shoulder extension    Shoulder internal rotation    Shoulder external rotation    Middle trapezius    Lower trapezius    Elbow flexion    Elbow extension    Wrist flexion    Wrist extension    Wrist ulnar deviation    Wrist radial deviation    Wrist pronation    Wrist supination    Grip strength     (Blank rows = not tested)  LOWER EXTREMITY MMT:    MMT Right eval Left eval  Hip flexion 4 3  Hip extension 4- 4-  Hip abduction 4 4  Hip adduction    Hip internal rotation    Hip external rotation    Knee flexion    Knee extension    Ankle dorsiflexion    Ankle plantarflexion    Ankle inversion    Ankle eversion     (Blank rows = not tested)  TODAY'S TREATMENT:                                                                                                                              DATE: 04/11/23 See HEP Moist heat cervical x 10 minutes Trigger Point Dry-Needling  Treatment  instructions: Expect mild to moderate muscle soreness. S/S of pneumothorax if dry needled over a lung field, and to seek immediate medical attention should they occur. Patient verbalized understanding of these instructions and education.  Patient Consent Given: Yes Education handout provided: Previously provided Muscles treated: Rt cervical paraspinals, Rt upper trap Electrical stimulation performed: No Parameters: N/A Treatment response/outcome: palpable increase in muscle length    PATIENT EDUCATION:  Education details: PT POC and goals, HEP, dry needling Person educated: Patient Education method: Explanation,  Demonstration, and Handouts Education comprehension: verbalized understanding and returned demonstration  HOME EXERCISE PROGRAM: Access Code: [email or web address] URL: [email or web address] Date: 04/11/2023 Prepared by: Reggy Eye  Exercises - Standing Shoulder Row with Anchored Resistance  - 1 x daily - 7 x weekly - 3 sets - 10 reps - Shoulder extension with resistance - Neutral  - 1 x daily - 7 x weekly - 3 sets - 10 reps - Drawing Bow  - 1 x daily - 7 x weekly - 3 sets - 10 reps  ASSESSMENT:  CLINICAL IMPRESSION: Patient is a 47 y.o. female who was seen today for physical therapy evaluation and treatment for back and neck pain. She presents with decreased strength and ROM, decreased activity tolerance and increased pain. She will benefit from skilled PT to address deficits and improve functional mobility with decreased pain.   OBJECTIVE IMPAIRMENTS: decreased activity tolerance, decreased ROM, decreased strength, and pain.   ACTIVITY LIMITATIONS: carrying and lifting  PARTICIPATION LIMITATIONS: community activity  PERSONAL FACTORS: Time since onset of injury/illness/exacerbation and 1 comorbidity: history of cervical and lumbar fusion  are also affecting patient's functional outcome.   REHAB POTENTIAL: Good  CLINICAL DECISION MAKING: Evolving/moderate  complexity  EVALUATION COMPLEXITY: Moderate   GOALS: Goals reviewed with patient? Yes  SHORT TERM GOALS: Target date: 05/09/2023    Pt will be independent with initial HEP Baseline:  Goal status: INITIAL  2.  Pt will improve FOTO to >= 60 to demo improved functional mobility Baseline:  Goal status: INITIAL    LONG TERM GOALS: Target date: 06/06/2023    Pt will be independent with advanced HEP Baseline:  Goal status: INITIAL  2.  Pt will improve bilat LE strength to 4+/5 to improve standing and walking tolerance Baseline:  Goal status: INITIAL  3.  Pt will tolerate holding granddaughter with back and neck pain <= 2/10 Baseline:  Goal status: INITIAL   PLAN:  PT FREQUENCY: 1-2x/week  PT DURATION: 8 weeks  PLANNED INTERVENTIONS: 97164- PT Re-evaluation, 97110-Therapeutic exercises, 97530- Therapeutic activity, 97112- Neuromuscular re-education, 97535- Self Care, 13086- Manual therapy, [id number]- Aquatic Therapy, 97014- Electrical stimulation (unattended), Patient/Family education, Taping, Dry Needling, Cryotherapy, and Moist heat  PLAN FOR NEXT SESSION: assess response to HEP and dry needling, progress core and postural strength  For all possible CPT codes, reference the Planned Interventions line above.     Check all conditions that are expected to impact treatment: {Conditions expected to impact treatment:None of these apply   If treatment provided at initial evaluation, no treatment charged due to lack of authorization.      Jayni Prescher, PT 04/11/2023, 5:04 PM

## 2023-04-17 ENCOUNTER — Ambulatory Visit (INDEPENDENT_AMBULATORY_CARE_PROVIDER_SITE_OTHER): Payer: Medicaid Other | Admitting: Sports Medicine

## 2023-04-17 ENCOUNTER — Ambulatory Visit: Payer: Medicaid Other

## 2023-04-17 DIAGNOSIS — G8929 Other chronic pain: Secondary | ICD-10-CM

## 2023-04-17 DIAGNOSIS — M25511 Pain in right shoulder: Secondary | ICD-10-CM | POA: Diagnosis not present

## 2023-04-17 DIAGNOSIS — M25462 Effusion, left knee: Secondary | ICD-10-CM | POA: Diagnosis not present

## 2023-04-17 DIAGNOSIS — M1711 Unilateral primary osteoarthritis, right knee: Secondary | ICD-10-CM

## 2023-04-17 DIAGNOSIS — M25562 Pain in left knee: Secondary | ICD-10-CM | POA: Diagnosis not present

## 2023-04-17 MED ORDER — NAPROXEN 500 MG PO TABS: 500.0000 mg | ORAL_TABLET | Freq: Two times a day (BID) | ORAL | 3 refills | Status: DC

## 2023-04-17 NOTE — Progress Notes (Signed)
    Procedures performed today:    None.  Independent interpretation of notes and tests performed by another provider:   None.  Brief History, Exam, Impression, and Recommendations:    Chronic pain of left knee Also with increasing pain left knee anteriorly. Exam is benign with the exception of tenderness at the lateral patellar facet. We will start conservatively, x-rays, nausea proximal and, physical therapy, injections if not better.  Chronic right shoulder pain Also with several months of increasing pain right shoulder anteriorly. On exam she does have positive speeds test, minimal tenderness anteriorly, for the most part negative impingement signs. X-rays, PT, naproxen, return in 6 weeks.    ____________________________________________ Ihor Austin. Benjamin Stain, M.D., ABFM., CAQSM., AME. Primary Care and Sports Medicine [facility]  Adjunct Professor of Family Medicine  [employer]  Restaurant manager, fast food

## 2023-04-17 NOTE — Assessment & Plan Note (Signed)
Also with increasing pain left knee anteriorly. Exam is benign with the exception of tenderness at the lateral patellar facet. We will start conservatively, x-rays, nausea proximal and, physical therapy, injections if not better.

## 2023-04-17 NOTE — Assessment & Plan Note (Signed)
Also with several months of increasing pain right shoulder anteriorly. On exam she does have positive speeds test, minimal tenderness anteriorly, for the most part negative impingement signs. X-rays, PT, naproxen, return in 6 weeks.

## 2023-04-19 ENCOUNTER — Ambulatory Visit: Payer: Medicaid Other | Admitting: Physical Therapy

## 2023-04-19 ENCOUNTER — Encounter: Payer: Self-pay | Admitting: Physician Assistant

## 2023-04-19 DIAGNOSIS — F5101 Primary insomnia: Secondary | ICD-10-CM

## 2023-04-19 DIAGNOSIS — Z981 Arthrodesis status: Secondary | ICD-10-CM

## 2023-04-19 MED ORDER — ZOLPIDEM TARTRATE 10 MG PO TABS: 10.0000 mg | ORAL_TABLET | Freq: Every day | ORAL | 1 refills | Status: DC

## 2023-04-19 MED ORDER — CYCLOBENZAPRINE HCL 10 MG PO TABS: ORAL_TABLET | ORAL | 0 refills | Status: DC

## 2023-04-24 ENCOUNTER — Ambulatory Visit: Payer: Medicaid Other | Admitting: Physical Therapy

## 2023-04-27 ENCOUNTER — Other Ambulatory Visit: Payer: Self-pay | Admitting: Physician Assistant

## 2023-04-27 DIAGNOSIS — R4586 Emotional lability: Secondary | ICD-10-CM

## 2023-04-28 NOTE — Telephone Encounter (Signed)
QUEtiapine ( SEROQUEL ) 100 mg Tab. Last written date.

## 2023-05-01 ENCOUNTER — Ambulatory Visit: Payer: Medicaid Other

## 2023-05-08 ENCOUNTER — Encounter: Payer: Self-pay | Admitting: Physician Assistant

## 2023-05-08 ENCOUNTER — Encounter: Payer: Self-pay | Admitting: Sports Medicine

## 2023-05-14 ENCOUNTER — Other Ambulatory Visit: Payer: Self-pay | Admitting: Physician Assistant

## 2023-05-14 DIAGNOSIS — T887XXA Unspecified adverse effect of drug or medicament, initial encounter: Secondary | ICD-10-CM

## 2023-05-24 ENCOUNTER — Ambulatory Visit (INDEPENDENT_AMBULATORY_CARE_PROVIDER_SITE_OTHER): Payer: Medicaid Other | Admitting: Physician Assistant

## 2023-05-24 VITALS — BP 107/69 | HR 104 | Ht 63.0 in | Wt 149.0 lb

## 2023-05-24 DIAGNOSIS — Z720 Tobacco use: Secondary | ICD-10-CM

## 2023-05-24 DIAGNOSIS — G2581 Restless legs syndrome: Secondary | ICD-10-CM | POA: Diagnosis not present

## 2023-05-24 DIAGNOSIS — I10 Essential (primary) hypertension: Secondary | ICD-10-CM | POA: Diagnosis not present

## 2023-05-24 DIAGNOSIS — F5101 Primary insomnia: Secondary | ICD-10-CM

## 2023-05-24 MED ORDER — ROPINIROLE HCL 0.5 MG PO TABS: ORAL_TABLET | ORAL | 1 refills | Status: DC

## 2023-05-26 ENCOUNTER — Encounter: Payer: Self-pay | Admitting: Physician Assistant

## 2023-05-26 MED ORDER — VARENICLINE TARTRATE 1 MG PO TABS: 1.0000 mg | ORAL_TABLET | Freq: Two times a day (BID) | ORAL | 2 refills | Status: DC

## 2023-05-26 NOTE — Progress Notes (Addendum)
Established Patient Office Visit  Subjective   Patient ID: [name], female    DOB: [birth year]  Age: 47 y.o. MRN: [id number]  Chief Complaint  Patient presents with   Medical Management of Chronic Issues    Med refills    HPI Pt is a 48 yo female who presents to the clinic for medication refills and follow up.   Pt is on chronic pain contract with pain clinic. Pt needs note for courts stating no issue with opioid or alcohol abuse. She continues to try to stop smoking. Would like refill on chantix.   She is doing ok on medications. Her mood is ok but in a custody battle right now with her daughters ex-boyfriend and their child. It si really stressful for her. She is sleeping ok with ambien. Needs requip for RLS.   She is compliant with medication. Denies any CP, palpitations, headaches or vision changes.   .. Active Ambulatory Problems    Diagnosis Date Noted   Adjustment disorder 07/06/2007   Tobacco abuse 02/05/2010   INTERSTITIAL CYSTITIS 02/05/2010   Primary insomnia 07/06/2007   FATIGUE 07/02/2009   Headache 07/06/2007   Tick-borne disease 08/09/2010   PTSD (post-traumatic stress disorder) 12/14/2012   Weight gain 11/29/2013   Constipation due to opioid therapy 01/17/2014   Carpal tunnel syndrome of left wrist 05/05/2014   Myofascial pain syndrome 05/29/2014   Endometriosis 07/20/2014   Tachycardia 08/13/2014   S/P cervical spinal fusion with adjacent level disease and right cervical radiculopathy 10/28/2014   Therapeutic opioid induced constipation 12/16/2014   Vasomotor flushing 06/15/2015   GAD (generalized anxiety disorder) 06/15/2015   Fixed pupils 08/31/2015   Depression 08/31/2015   Primary osteoarthritis of left first metacarpophalangeal joint 04/04/2016   Palpitations 04/14/2016   Primary osteoarthritis of right knee with tibial subchondral insufficiency fracture 09/05/2016   Chronic pain syndrome 01/08/2017   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    Essential hypertension 10/06/2017   Hx of cold sores 10/06/2017   Asthma 01/29/2018   Leukocytes in urine 03/04/2019   Other microscopic hematuria 03/04/2019   Left ovarian cyst 03/04/2019   RLS (restless legs syndrome) 07/08/2019   Nodule right fifth PIP of hand 09/03/2019   Intractable right heel pain 05/04/2020   Numbness of right great toe 06/23/2020   Injury of right knee 11/14/2020   Chronic pain of left knee 11/14/2020   Stress at home 12/29/2020   Former cigarette smoker 04/20/2021   Osteoarthritis of first metatarsophalangeal (MTP) joint of right foot 10/18/2021   Digital mucinous cyst of second DIP of right hand 07/20/2022   Anxiety state 07/06/2007   Medication management 04/02/2019   Swelling of fifth toe of left foot 10/26/2022   Sacroiliac joint dysfunction 10/26/2022   Headache 07/06/2007   Menopausal syndrome (hot flashes) 03/24/2023   Chronic right shoulder pain 04/17/2023   Resolved Ambulatory Problems    Diagnosis Date Noted   Depressed 06/26/2008   Allergic rhinitis 10/13/2009   TROCHANTERIC BURSITIS 06/21/2010   Polyuria 11/04/2010   Degenerative disc disease, cervical 10/31/2013   Cervical disc disorder with radiculopathy of cervical region 11/29/2013   Greater trochanteric bursitis 11/29/2013   Itching 11/29/2013   Right cervical radiculopathy 01/17/2014   Left shoulder pain 04/02/2014   Paresthesia 06/03/2014   Nausea with vomiting 11/25/2014   Dysmenorrhea 05/11/2015   Dysuria 08/31/2015   Cervical disc disorder with radiculopathy of cervical region 11/11/2015   Acute upper respiratory infection 04/04/2016   Localized primary carpometacarpal osteoarthritis,  left 04/04/2016   Balance problems 07/19/2016   Dizziness 07/19/2016   Trigger thumb, left thumb 10/06/2016   First degree ankle sprain, left, initial encounter 08/29/2017   Chronic neck pain 01/29/2018   Traumatic ecchymosis of left foot 10/15/2018   Mild episode of recurrent major  depressive disorder (HCC) 10/30/2018   Dog bite 02/15/2019   Cervical post-laminectomy syndrome 07/08/2019   Mood changes 08/25/2019   Pes anserine bursitis 09/03/2019   Impingement syndrome, shoulder, left 12/30/2019   COVID-19 virus infection 01/14/2020   Pneumonia of both lower lobes due to infectious organism 04/20/2021   Contusion of hand, left 05/18/2021   Right leg swelling 05/18/2021   Baker's cyst of knee, right 05/21/2021   Lateral epicondylitis, right elbow 09/20/2021   Acute foot pain, right 09/20/2021   Motion sickness 01/11/2022   Pes anserinus bursitis of right knee 09/05/2016   Tenosynovitis of left hand little finger 11/09/2022   Mass of skin of finger of right hand 11/21/2022   Pain in finger of right hand 10/20/2022   Past Medical History:  Diagnosis Date   Acute medial meniscus tear of right knee    Anxiety    Arthritis    Complication of anesthesia    COVID-19 01/15/2020   Dysrhythmia    Elevated heart rate and blood pressure    Family history of adverse reaction to anesthesia    GERD (gastroesophageal reflux disease)    Headache(784.0)    IC (interstitial cystitis)    Insomnia    Myofascial pain syndrome, cervical    Neck pain    Ovarian cyst    Pneumonia    PONV (postoperative nausea and vomiting)      ROS See HPI.    Objective:     BP 107/69   Pulse (!) 104   Ht 5\' 3"  (1.6 m)   Wt 149 lb (67.6 kg)   LMP 05/10/2015 (LMP Unknown)   SpO2 99%   BMI 26.39 kg/m  BP Readings from Last 3 Encounters:  05/24/23 107/69  01/19/23 124/76  01/04/23 112/66   Wt Readings from Last 3 Encounters:  05/24/23 149 lb (67.6 kg)  01/19/23 145 lb (65.8 kg)  01/04/23 145 lb 8 oz (66 kg)      Physical Exam Constitutional:      Appearance: Normal appearance.  Cardiovascular:     Rate and Rhythm: Normal rate and regular rhythm.  Pulmonary:     Effort: Pulmonary effort is normal.  Neurological:     General: No focal deficit present.     Mental  Status: She is alert and oriented to person, place, and time.  Psychiatric:        Mood and Affect: Mood normal.         Assessment & Plan:  .Marland KitchenKashonda was seen today for medical management of chronic issues.  Diagnoses and all orders for this visit:  Essential hypertension  RLS (restless legs syndrome) -     rOPINIRole (REQUIP) 0.5 MG tablet; TAKE 1 TO 2 TABLETS TWICE A DAY  Primary insomnia  Tobacco abuse -     varenicline (CHANTIX) 1 MG tablet; Take 1 tablet (1 mg total) by mouth 2 (two) times daily.   Reviewed medications and sent refills for what was due.  Continue to work on smoking cessation-refilled chantix. Cut back week by week.  Letter written for courts stating she is regularly drug screened and no abusive behavior has ever been noted.  She see pain clinic.  She  uses klonapin sparingly-last refill over a year ago.    Return in about 6 months (around 11/21/2023).    Tandy Gaw, PA-C

## 2023-05-29 ENCOUNTER — Ambulatory Visit (INDEPENDENT_AMBULATORY_CARE_PROVIDER_SITE_OTHER): Payer: Medicaid Other | Admitting: Sports Medicine

## 2023-05-29 DIAGNOSIS — M25511 Pain in right shoulder: Secondary | ICD-10-CM

## 2023-05-29 DIAGNOSIS — M25562 Pain in left knee: Secondary | ICD-10-CM

## 2023-05-29 DIAGNOSIS — G8929 Other chronic pain: Secondary | ICD-10-CM

## 2023-05-29 NOTE — Progress Notes (Signed)
    Procedures performed today:    None.  Independent interpretation of notes and tests performed by another provider:   None.  Brief History, Exam, Impression, and Recommendations:    Chronic pain of left knee Increasing pain at left knee anteriorly, somewhat she is going to be getting a genicular RFA sometime in the future, did not do any therapy so I will order this again. X-rays did show some osteoarthritis.  Chronic right shoulder pain Anterior shoulder pain with a positive speeds test, tenderness anteriorly over the bicipital groove but negative impingement signs, x-rays normal. Suspected more of a proximal biceps tendinopathy. Sounds like her pain doc and spine surgeon are going to try a cervical epidural, I did ask her to do some physical therapy but this has not yet been done, we will order therapy again and she can see me back after 6 solid weeks of physical therapy for her shoulder and knee and we will consider MRI +/- biceps sheath injection if not better.    ____________________________________________ Ihor Austin. Benjamin Stain, M.D., ABFM., CAQSM., AME. Primary Care and Sports Medicine  [facility]  Adjunct Professor of Family Medicine  [place] [employer] [facility] [employer]  Restaurant manager, fast food

## 2023-05-29 NOTE — Assessment & Plan Note (Signed)
Anterior shoulder pain with a positive speeds test, tenderness anteriorly over the bicipital groove but negative impingement signs, x-rays normal. Suspected more of a proximal biceps tendinopathy. Sounds like her pain doc and spine surgeon are going to try a cervical epidural, I did ask her to do some physical therapy but this has not yet been done, we will order therapy again and she can see me back after 6 solid weeks of physical therapy for her shoulder and knee and we will consider MRI +/- biceps sheath injection if not better.

## 2023-05-29 NOTE — Assessment & Plan Note (Signed)
Increasing pain at left knee anteriorly, somewhat she is going to be getting a genicular RFA sometime in the future, did not do any therapy so I will order this again. X-rays did show some osteoarthritis.

## 2023-05-30 ENCOUNTER — Ambulatory Visit: Payer: Medicaid Other | Admitting: Sports Medicine

## 2023-06-01 ENCOUNTER — Other Ambulatory Visit: Payer: Self-pay | Admitting: Physician Assistant

## 2023-06-01 ENCOUNTER — Encounter: Payer: Self-pay | Admitting: Physician Assistant

## 2023-06-01 DIAGNOSIS — R4586 Emotional lability: Secondary | ICD-10-CM

## 2023-06-14 ENCOUNTER — Telehealth: Payer: Self-pay

## 2023-06-14 MED ORDER — QUETIAPINE FUMARATE 100 MG PO TABS: ORAL_TABLET | ORAL | 0 refills | Status: DC

## 2023-06-14 NOTE — Addendum Note (Signed)
Addended by: Jomarie Longs on: 06/14/2023 01:43 PM   Modules accepted: Orders

## 2023-06-14 NOTE — Telephone Encounter (Signed)
Initiated Prior authorization ZOX:[id number] Fumarate 100MG  tablets Via: Covermymeds Case/Key:[id number] Status: APPROVED as of 06/13/22 Reason:Authorization Expiration Date: 04/30/2024  Notified Pt via: Mychart

## 2023-06-22 ENCOUNTER — Other Ambulatory Visit: Payer: Self-pay | Admitting: Physician Assistant

## 2023-06-22 DIAGNOSIS — F411 Generalized anxiety disorder: Secondary | ICD-10-CM

## 2023-06-22 DIAGNOSIS — F4322 Adjustment disorder with anxiety: Secondary | ICD-10-CM

## 2023-06-28 ENCOUNTER — Encounter: Payer: Self-pay | Admitting: Physician Assistant

## 2023-06-28 NOTE — Telephone Encounter (Signed)
 Seroquel issue has been resolved at pharmacy and I was told that patient can pick up medication today. Left this voice mail regarding this on patient home #  Requesting rx rf of  clonazepam  Last written 03/29/2023 Last OV 05/29/2023 Upcoming appt = 11/21/2023 Tandy Gaw March 2025 Dr. Benjamin Stain.

## 2023-06-30 MED ORDER — CLONAZEPAM 0.5 MG PO TABS: 0.5000 mg | ORAL_TABLET | Freq: Two times a day (BID) | ORAL | 0 refills | Status: DC | PRN

## 2023-07-11 ENCOUNTER — Ambulatory Visit: Payer: Medicaid Other | Admitting: Sports Medicine

## 2023-08-08 ENCOUNTER — Other Ambulatory Visit: Payer: Self-pay | Admitting: Physician Assistant

## 2023-08-08 DIAGNOSIS — T887XXA Unspecified adverse effect of drug or medicament, initial encounter: Secondary | ICD-10-CM

## 2023-08-25 NOTE — Telephone Encounter (Signed)
 This request has been handled. No further action is required. Seroquel  approved from 06/14/23 - 05/01/24.

## 2023-08-29 ENCOUNTER — Ambulatory Visit: Admitting: Physician Assistant

## 2023-08-29 ENCOUNTER — Encounter: Payer: Self-pay | Admitting: Physician Assistant

## 2023-08-29 VITALS — BP 95/61 | HR 80 | Temp 98.6°F | Ht 63.0 in | Wt 151.0 lb

## 2023-08-29 DIAGNOSIS — J302 Other seasonal allergic rhinitis: Secondary | ICD-10-CM | POA: Diagnosis not present

## 2023-08-29 DIAGNOSIS — B370 Candidal stomatitis: Secondary | ICD-10-CM | POA: Diagnosis not present

## 2023-08-29 MED ORDER — NYSTATIN 100000 UNIT/ML MT SUSP: 5.0000 mL | Freq: Four times a day (QID) | OROMUCOSAL | 0 refills | Status: AC

## 2023-08-29 MED ORDER — FLUCONAZOLE 150 MG PO TABS: 150.0000 mg | ORAL_TABLET | Freq: Once | ORAL | 0 refills | Status: AC

## 2023-08-29 MED ORDER — MONTELUKAST SODIUM 10 MG PO TABS
10.0000 mg | ORAL_TABLET | Freq: Every day | ORAL | 3 refills | Status: AC
Start: 2023-08-29 — End: ?

## 2023-08-29 NOTE — Progress Notes (Unsigned)
   Acute Office Visit  Subjective:     Patient ID: [name], female    DOB: Sep 19, 1975, 48 y.o.   MRN: [id number]  Chief Complaint  Patient presents with   Thrush    Oral thrush onset since Thursday , pt states tongue is white mouth hurts , no fever or vomiting,pt is requesting new script for singular     HPI Patient is in today for painful mouth with white appearance on tongue. She feels like it is "thrush". She has had this before. She does have dentures. She has used more of her airsupra  lately for rescue as well. She admits she does not always rinse her mouth out. She has had nystatin  mouth wash before but it has been a long time.   Needs refills of singulair for allergies.   ROS See HPI.      Objective:    BP 95/61   Pulse 80   Temp 98.6 F (37 C) (Oral)   Ht 5\' 3"  (1.6 m)   Wt 151 lb (68.5 kg)   LMP 05/10/2015 (LMP Unknown)   SpO2 99%   BMI 26.75 kg/m  BP Readings from Last 3 Encounters:  08/29/23 95/61  05/24/23 107/69  01/19/23 124/76   Wt Readings from Last 3 Encounters:  08/29/23 151 lb (68.5 kg)  05/24/23 149 lb (67.6 kg)  01/19/23 145 lb (65.8 kg)      Physical Exam HENT:     Mouth/Throat:     Comments: White film on tongue not removable.  Erythematous appearance to buccal mucosa.  Upper palate dentures.  Neurological:     Mental Status: She is alert.          Assessment & Plan:  .Aaron AasAryka was seen today for thrush.  Diagnoses and all orders for this visit:  Thrush -     nystatin  (MYCOSTATIN ) 100000 UNIT/ML suspension; Take 5 mLs (500,000 Units total) by mouth 4 (four) times daily. -     fluconazole  (DIFLUCAN ) 150 MG tablet; Take 1 tablet (150 mg total) by mouth once for 1 dose.  Seasonal allergies -     montelukast (SINGULAIR) 10 MG tablet; Take 1 tablet (10 mg total) by mouth at bedtime.   Diflucan  and nystatin  mouthwash sent to pharmacy Reminded patient to rinse after airsupra  use Singular refilled.   Brigitta Pricer, PA-C

## 2023-08-30 ENCOUNTER — Encounter: Payer: Self-pay | Admitting: Physician Assistant

## 2023-09-02 ENCOUNTER — Other Ambulatory Visit: Payer: Self-pay | Admitting: Sports Medicine

## 2023-09-02 DIAGNOSIS — G8929 Other chronic pain: Secondary | ICD-10-CM

## 2023-09-04 ENCOUNTER — Telehealth: Admitting: Physician Assistant

## 2023-09-08 ENCOUNTER — Telehealth (INDEPENDENT_AMBULATORY_CARE_PROVIDER_SITE_OTHER): Admitting: Physician Assistant

## 2023-09-08 DIAGNOSIS — J40 Bronchitis, not specified as acute or chronic: Secondary | ICD-10-CM | POA: Diagnosis not present

## 2023-09-08 DIAGNOSIS — G894 Chronic pain syndrome: Secondary | ICD-10-CM

## 2023-09-08 DIAGNOSIS — J329 Chronic sinusitis, unspecified: Secondary | ICD-10-CM | POA: Diagnosis not present

## 2023-09-08 DIAGNOSIS — L309 Dermatitis, unspecified: Secondary | ICD-10-CM | POA: Diagnosis not present

## 2023-09-08 MED ORDER — OXYCODONE HCL 10 MG PO TABS: 10.0000 mg | ORAL_TABLET | Freq: Three times a day (TID) | ORAL | 0 refills | Status: AC | PRN

## 2023-09-08 MED ORDER — TRIAMCINOLONE ACETONIDE 0.1 % EX CREA: 1.0000 | TOPICAL_CREAM | Freq: Two times a day (BID) | CUTANEOUS | 0 refills | Status: AC

## 2023-09-08 MED ORDER — METHYLPREDNISOLONE 4 MG PO TBPK: ORAL_TABLET | ORAL | 0 refills | Status: DC

## 2023-09-08 MED ORDER — AZITHROMYCIN 250 MG PO TABS: ORAL_TABLET | ORAL | 0 refills | Status: DC

## 2023-09-08 NOTE — Progress Notes (Unsigned)
..  Virtual Visit via Video Note  I connected with [name] on 09/08/23 at  3:00 PM EDT by a video enabled telemedicine application and verified that I am speaking with the correct person using two identifiers.  Location: Patient: home Provider: clinic  .Aaron AasParticipating in visit:  Patient: [name] Provider: Sandy Crumb PA-c   I discussed the limitations of evaluation and management by telemedicine and the availability of in person appointments. The patient expressed understanding and agreed to proceed.  History of Present Illness: Pt is a 48 yo female who calls in with sinus pressure, drainage and cough for almost a week. She felt like she had a fever but not checked. She is blowing up green discharge. She has had to use albuterol  inhaler once. She is taking nyquil and dayquil with little relief. No other sick contacts.   Triamcinolone  helps for her intermittent dermatitis. She needs refills.   She had an insurance change and had issue with them paying for this last pain clinic appt and had to cancel. She needs one refill to bridge until she can reschedule with the correct insurance payer.    Observations/Objective: No acute distress Normal breathing Productive cough Normal mood and appearance   Assessment and Plan: Aaron AasAaron AasDiagnoses and all orders for this visit:  Sinobronchitis -     azithromycin  (ZITHROMAX  Z-PAK) 250 MG tablet; Take 2 tablets (500 mg) on  Day 1,  followed by 1 tablet (250 mg) once daily on Days 2 through 5. -     methylPREDNISolone  (MEDROL  DOSEPAK) 4 MG TBPK tablet; Take as directed by package insert.  Dermatitis -     triamcinolone  cream (KENALOG ) 0.1 %; Apply 1 Application topically 2 (two) times daily.  Chronic pain syndrome -     Oxycodone  HCl 10 MG TABS; Take 1 tablet (10 mg total) by mouth every 8 (eight) hours as needed (pain).   Sent zpak and medrol  dose pack to pharmacy Continue symptomatic care Follow up as needed if symptoms  persist or worsen  Triamcinolone  for as needed skin dermatitis  .Aaron AasPDMP reviewed during this encounter. Last refill 4/9 No concerns Sent one month only of oxycodone  and then will follow up with pain clinic   Follow Up Instructions:    I discussed the assessment and treatment plan with the patient. The patient was provided an opportunity to ask questions and all were answered. The patient agreed with the plan and demonstrated an understanding of the instructions.   The patient was advised to call back or seek an in-person evaluation if the symptoms worsen or if the condition fails to improve as anticipated.   Kingjames Coury, PA-C

## 2023-09-09 ENCOUNTER — Other Ambulatory Visit: Payer: Self-pay | Admitting: Physician Assistant

## 2023-09-11 ENCOUNTER — Encounter: Payer: Self-pay | Admitting: Physician Assistant

## 2023-09-12 MED ORDER — PREGABALIN 200 MG PO CAPS: 200.0000 mg | ORAL_CAPSULE | Freq: Three times a day (TID) | ORAL | 4 refills | Status: DC

## 2023-09-25 ENCOUNTER — Encounter: Payer: Self-pay | Admitting: Physician Assistant

## 2023-09-25 DIAGNOSIS — K219 Gastro-esophageal reflux disease without esophagitis: Secondary | ICD-10-CM

## 2023-09-25 DIAGNOSIS — T887XXA Unspecified adverse effect of drug or medicament, initial encounter: Secondary | ICD-10-CM

## 2023-09-25 DIAGNOSIS — G43009 Migraine without aura, not intractable, without status migrainosus: Secondary | ICD-10-CM

## 2023-09-26 MED ORDER — PROMETHAZINE HCL 25 MG PO TABS: 25.0000 mg | ORAL_TABLET | Freq: Three times a day (TID) | ORAL | 0 refills | Status: DC | PRN

## 2023-09-26 MED ORDER — PANTOPRAZOLE SODIUM 40 MG PO TBEC: 40.0000 mg | DELAYED_RELEASE_TABLET | Freq: Every day | ORAL | 1 refills | Status: DC

## 2023-09-26 MED ORDER — RIZATRIPTAN BENZOATE 10 MG PO TABS: ORAL_TABLET | ORAL | 5 refills | Status: AC

## 2023-09-26 NOTE — Telephone Encounter (Signed)
 Forwarding request to Dr. Philis Brazier covering Sandy Crumb Requesting rx rf of  Pantoprazole  40mg   Last written 01/24/2022 Maxalt  10mg  Last written 12/20/2022 And phenergen 35mg   Last written 08/09/2023 Last OV 08/29/2023 sick visit Upcoming appt 11/21/2023

## 2023-09-29 ENCOUNTER — Encounter: Payer: Self-pay | Admitting: Physician Assistant

## 2023-10-05 ENCOUNTER — Ambulatory Visit

## 2023-10-05 ENCOUNTER — Ambulatory Visit: Payer: Self-pay | Admitting: Sports Medicine

## 2023-10-05 ENCOUNTER — Encounter: Payer: Self-pay | Admitting: Sports Medicine

## 2023-10-05 ENCOUNTER — Ambulatory Visit: Admitting: Sports Medicine

## 2023-10-05 DIAGNOSIS — S6722XD Crushing injury of left hand, subsequent encounter: Secondary | ICD-10-CM | POA: Diagnosis not present

## 2023-10-05 DIAGNOSIS — M25542 Pain in joints of left hand: Secondary | ICD-10-CM | POA: Diagnosis not present

## 2023-10-05 NOTE — Assessment & Plan Note (Signed)
 Very pleasant 48 year old female, she slammed her left hand in a door approximately 2 weeks ago, she has pain at the second metacarpal shaft, tenderness and fullness directly at the middle of the shaft, neurovascular intact distally. We will add a Velcro brace that she will wear up to her PIP, and she will get some x-rays today, follow-up depends on what we see on the x-rays.

## 2023-10-05 NOTE — Progress Notes (Signed)
    Procedures performed today:    None.  Independent interpretation of notes and tests performed by another provider:   None.  Brief History, Exam, Impression, and Recommendations:    Pain, joint, hand, left Very pleasant 48 year old female, she slammed her left hand in a door approximately 2 weeks ago, she has pain at the second metacarpal shaft, tenderness and fullness directly at the middle of the shaft, neurovascular intact distally. We will add a Velcro brace that she will wear up to her PIP, and she will get some x-rays today, follow-up depends on what we see on the x-rays.    ____________________________________________ Joselyn Nicely. Sandy Crumb, M.D., ABFM., CAQSM., AME. Primary Care and Sports Medicine [facility]  Adjunct Professor of [facility] Medicine  [employer]  Restaurant manager, fast food

## 2023-10-10 ENCOUNTER — Encounter: Payer: Self-pay | Admitting: Physician Assistant

## 2023-10-10 ENCOUNTER — Ambulatory Visit (INDEPENDENT_AMBULATORY_CARE_PROVIDER_SITE_OTHER): Admitting: Physician Assistant

## 2023-10-10 VITALS — BP 136/90 | HR 102 | Ht 63.0 in | Wt 146.0 lb

## 2023-10-10 DIAGNOSIS — L299 Pruritus, unspecified: Secondary | ICD-10-CM | POA: Diagnosis not present

## 2023-10-10 DIAGNOSIS — W57XXXA Bitten or stung by nonvenomous insect and other nonvenomous arthropods, initial encounter: Secondary | ICD-10-CM | POA: Diagnosis not present

## 2023-10-10 MED ORDER — CLOBETASOL PROPIONATE 0.05 % EX CREA: 1.0000 | TOPICAL_CREAM | Freq: Two times a day (BID) | CUTANEOUS | 0 refills | Status: AC

## 2023-10-10 MED ORDER — METHYLPREDNISOLONE SODIUM SUCC 125 MG IJ SOLR
125.0000 mg | Freq: Once | INTRAMUSCULAR | Status: AC
  Administered 2023-10-10: 125 mg via INTRAMUSCULAR

## 2023-10-10 MED ORDER — HYDROXYZINE PAMOATE 25 MG PO CAPS: 25.0000 mg | ORAL_CAPSULE | Freq: Three times a day (TID) | ORAL | 0 refills | Status: AC | PRN

## 2023-10-10 NOTE — Patient Instructions (Addendum)
 Use lotion and topical steroid over bites on legs Hydroxyzine  as needed for itching Make sure to clean legs well after being outside  Insect Bite, Adult An insect bite can make your skin red, itchy, and swollen. Some insects can spread disease to people with a bite. However, most insect bites do not lead to disease, and most are not serious. What are the causes? Insects may bite for many reasons, including: Hunger. To defend themselves. Insects that bite include: Spiders. Mosquitoes. Flies. Ticks and fleas. Ants. Kissing bugs. Chiggers. What are the signs or symptoms? Symptoms often last for 2-4 days. However, itching can last up to 10 days. Symptoms include: Itching or pain in the bite area. Redness and swelling in the bite area. An open wound. In rare cases, a person may have a very bad allergic reaction (anaphylactic reaction) to a bite. Symptoms of an anaphylactic reaction may include: Feeling warm in the face (flushed). Your face may turn red. Itchy, red, swollen areas of skin (hives). Swelling of the eyes, lips, face, mouth, tongue, or throat. Trouble with breathing, talking, or swallowing. High-pitched whistling sounds, most often when breathing out (wheezing). Feeling dizzy or light-headed. Fainting. Pain or cramps in your belly (abdomen). Vomiting. Watery poop (diarrhea). How is this treated? Most insect bites are not serious. Symptoms often go away on their own. When treatment is advised, it may include: Putting ice on the bite area. Putting a cream or lotion, like calamine lotion, on the bite area. This helps with itching. Using medicines called antihistamines. You may also need: A tetanus shot if you are not up to date. An antibiotic cream or medicine. This treatment is needed if the bite area gets infected. Follow these instructions at home: Bite area care  Do not scratch the bite area. It may help to cover the bite area with a bandage or close-fitting  clothing. Keep the bite area clean and dry. Check the bite area every day for signs of infection. Check for: More redness, swelling, or pain. Fluid or blood. Warmth. Pus or a bad smell. Wash your hands often. Managing pain, itching, and swelling  You may put any of these on the bite area as told by your doctor: A paste made of baking soda and water . Cortisone cream. Calamine lotion. If told, put ice on the bite area. To do this: Put ice in a plastic bag. Place a towel between your skin and the bag. Leave the ice on for 20 minutes, 2-3 times a day. If your skin turns bright red, take off the ice right away to prevent skin damage. The risk of skin damage is higher if you cannot feel pain, heat, or cold. General instructions Apply or take over-the-counter and prescription medicines only as told by your doctor. If you were prescribed antibiotics, take or apply them as told by your doctor. Do not stop using them even if you start to feel better. How is this prevented? To help you have a lower risk of insect bites: When you are outside, wear clothes that cover your arms and legs. Use insect repellent. The best insect repellents contain one of these: DEET. Picaridin. Oil of lemon eucalyptus (OLE). IR3535. Consider spraying your clothing with a pesticide called permethrin. Permethrin helps prevent insect bites. It works for several weeks and for up to 5-6 clothing washes. Do not apply permethrin directly to the skin. If your home windows do not have screens, think about putting some in. If you will be sleeping in an  area where there are mosquitoes, consider covering your sleeping area with a mosquito net. Contact a doctor if: You have redness, swelling, or pain in the bite area. You have fluid or blood coming from the bite area. The bite area feels warm to the touch. You have pus or a bad smell coming from the bite area. You have a fever. Get help right away if: You have joint  pain. You have a rash. You feel weak or more tired than you normally do. You have neck pain or a headache. You have signs of an anaphylactic reaction. Signs may include: Swelling of your eyes, lips, face, mouth, tongue, or throat. Feeling warm in the face. Itchy, red, swollen areas of skin. Trouble with breathing, talking, or swallowing. Wheezing. Feeling dizzy or light-headed. Fainting. Pain or cramps in your belly. Vomiting or watery poop. These symptoms may be an emergency. Get help right away. Call 911. Do not wait to see if symptoms will go away. Do not drive yourself to the hospital. Summary An insect bite can make your skin red, itchy, and swollen. Treatment is usually not needed. Symptoms often go away on their own. Do not scratch the bite area. Keep it clean and dry. Use insect repellent to help prevent insect bites. Contact a doctor if you have signs of infection. This information is not intended to replace advice given to you by your health care provider. Make sure you discuss any questions you have with your health care provider. Document Revised: 07/13/2021 Document Reviewed: 07/13/2021 Elsevier Patient Education  2024 [employer].

## 2023-10-10 NOTE — Progress Notes (Unsigned)
   Acute Office Visit  Subjective:     Patient ID: [name], female    DOB: Mar 22, 1976, 48 y.o.   MRN: [id number]  No chief complaint on file.   HPI Patient is in today for ***  ROS      Objective:    LMP 05/10/2015 (LMP Unknown)  {Vitals History (Optional):23777}  Physical Exam  No results found for any visits on 10/10/23.      Assessment & Plan:   Problem List Items Addressed This Visit   None Visit Diagnoses       Insect bite of lower leg, unspecified laterality, initial encounter    -  Primary       Meds ordered this encounter  Medications   hydrOXYzine  (VISTARIL ) 25 MG capsule    Sig: Take 1 capsule (25 mg total) by mouth every 8 (eight) hours as needed.    Dispense:  60 capsule    Refill:  0    Supervising Provider:   METHENEY, CATHERINE D [[id number]]   clobetasol  cream (TEMOVATE ) 0.05 %    Sig: Apply 1 Application topically 2 (two) times daily.    Dispense:  60 g    Refill:  0    Supervising Provider:   METHENEY, CATHERINE D [[id number]]    No follow-ups on file.  Jashon Ishida, PA-C

## 2023-10-11 DIAGNOSIS — W57XXXA Bitten or stung by nonvenomous insect and other nonvenomous arthropods, initial encounter: Secondary | ICD-10-CM | POA: Insufficient documentation

## 2023-10-12 ENCOUNTER — Encounter: Payer: Self-pay | Admitting: Physician Assistant

## 2023-10-12 ENCOUNTER — Other Ambulatory Visit: Payer: Self-pay | Admitting: Physician Assistant

## 2023-10-12 DIAGNOSIS — F5101 Primary insomnia: Secondary | ICD-10-CM

## 2023-10-13 MED ORDER — ZOLPIDEM TARTRATE 10 MG PO TABS
10.0000 mg | ORAL_TABLET | Freq: Every day | ORAL | 0 refills | Status: DC
Start: 2023-10-13 — End: 2024-01-08

## 2023-10-13 NOTE — Telephone Encounter (Signed)
 Requesting rx rf of Ambien  10mg  Last written 04/19/2023 Last OV 10/10/2023 Upcoming appt 11/21/2023

## 2023-10-24 ENCOUNTER — Ambulatory Visit (INDEPENDENT_AMBULATORY_CARE_PROVIDER_SITE_OTHER): Admitting: Physician Assistant

## 2023-10-24 ENCOUNTER — Encounter: Payer: Self-pay | Admitting: Physician Assistant

## 2023-10-24 VITALS — BP 118/78 | HR 74 | Temp 98.4°F | Ht 63.0 in | Wt 146.0 lb

## 2023-10-24 DIAGNOSIS — J329 Chronic sinusitis, unspecified: Secondary | ICD-10-CM | POA: Diagnosis not present

## 2023-10-24 DIAGNOSIS — J40 Bronchitis, not specified as acute or chronic: Secondary | ICD-10-CM

## 2023-10-24 DIAGNOSIS — R6889 Other general symptoms and signs: Secondary | ICD-10-CM

## 2023-10-24 LAB — POC SOFIA 2 FLU + SARS ANTIGEN FIA
Influenza A, POC: NEGATIVE
Influenza B, POC: NEGATIVE
SARS Coronavirus 2 Ag: NEGATIVE

## 2023-10-24 LAB — POCT RAPID STREP A (OFFICE): Rapid Strep A Screen: NEGATIVE

## 2023-10-24 MED ORDER — PROMETHAZINE-DM 6.25-15 MG/5ML PO SYRP: 5.0000 mL | ORAL_SOLUTION | Freq: Four times a day (QID) | ORAL | 0 refills | Status: DC | PRN

## 2023-10-24 MED ORDER — AMOXICILLIN-POT CLAVULANATE 875-125 MG PO TABS: 1.0000 | ORAL_TABLET | Freq: Two times a day (BID) | ORAL | 0 refills | Status: DC

## 2023-10-24 MED ORDER — METHYLPREDNISOLONE 4 MG PO TBPK: ORAL_TABLET | ORAL | 0 refills | Status: DC

## 2023-10-24 NOTE — Progress Notes (Signed)
 Acute Office Visit  Subjective:     Patient ID: [name], female    DOB: [birth year], 48 y.o.   MRN: [id number]  Chief Complaint  Patient presents with   Medical Management of Chronic Issues    Congestion, cough, sore throat     HPI Patient is in today for Cough, sore throat and runny nose. She states that on Saturday she was watching her granddaughter who was sick (and attends daycare) and on Sunday she woke up feeling terrible with a headache and cough. She is occasionally coughing up green sputum. She also notes itchy watery eyes and itchy ears. She has a history of  asthma, recurrent ear infections and tubes as a child. She denies chest pain, ear pain, tinnitus or hearing loss.  She thinks she may have had a fever Sunday night. She has tried NyQuil and used her inhaler once Sunday. She thinks it is getting worse.  Review of Systems  Constitutional:  Positive for fever and malaise/fatigue. Negative for chills.  HENT:  Positive for sore throat. Negative for ear discharge, ear pain, hearing loss, sinus pain and tinnitus.   Eyes:  Positive for discharge.  Respiratory:  Positive for cough and sputum production. Negative for hemoptysis and wheezing.   Cardiovascular:  Negative for chest pain and palpitations.        Objective:    BP 118/78   Pulse 74   Temp 98.4 F (36.9 C) (Oral)   Ht 5' 3 (1.6 m)   Wt 146 lb (66.2 kg)   LMP 05/10/2015 (LMP Unknown)   SpO2 99%   BMI 25.86 kg/m  BP Readings from Last 3 Encounters:  10/24/23 118/78  10/10/23 (!) 136/90  08/29/23 95/61   Wt Readings from Last 3 Encounters:  10/24/23 146 lb (66.2 kg)  10/10/23 146 lb (66.2 kg)  08/29/23 151 lb (68.5 kg)      Physical Exam Constitutional:      Appearance: Normal appearance.  HENT:     Ears:     Comments: No tenderness to palpation of bilateral tragus and mastoid. Bilateral EAC clear. Bilateral TM intact but Dull, slightly retracted and cloudy white with mild  erythema around the periphery.     Mouth/Throat:     Pharynx: Uvula midline. Posterior oropharyngeal erythema and postnasal drip present. No oropharyngeal exudate or uvula swelling.     Tonsils: No tonsillar exudate or tonsillar abscesses.   Cardiovascular:     Rate and Rhythm: Normal rate and regular rhythm.     Heart sounds: Normal heart sounds.  Pulmonary:     Breath sounds: Normal breath sounds. No wheezing, rhonchi or rales.  Lymphadenopathy:     Cervical: Cervical adenopathy present.   Neurological:     Mental Status: She is alert.     .. Results for orders placed or performed in visit on 10/24/23  POCT rapid strep A   Collection Time: 10/24/23  4:07 PM  Result Value Ref Range   Rapid Strep A Screen Negative Negative  POC SOFIA 2 FLU + SARS ANTIGEN FIA   Collection Time: 10/24/23  4:08 PM  Result Value Ref Range   Influenza A, POC Negative Negative   Influenza B, POC Negative Negative   SARS Coronavirus 2 Ag Negative Negative   *Note: Due to a large number of results and/or encounters for the requested time period, some results have not been displayed. A complete set of results can be found in Results Review.  Assessment & Plan:  .SABRAMekesha was seen today for medical management of chronic issues.  Diagnoses and all orders for this visit:  Sinobronchitis -     promethazine -dextromethorphan (PROMETHAZINE -DM) 6.25-15 MG/5ML syrup; Take 5 mLs by mouth 4 (four) times daily as needed. -     methylPREDNISolone  (MEDROL  DOSEPAK) 4 MG TBPK tablet; Take as directed by package insert. -     amoxicillin -clavulanate (AUGMENTIN ) 875-125 MG tablet; Take 1 tablet by mouth 2 (two) times daily.  Flu-like symptoms -     POC SOFIA 2 FLU + SARS ANTIGEN FIA -     POCT rapid strep A    Negative flu and covid rapid testing today Educated pt that the likely cause of her symptoms is viral but she stated that she is coughing up green. Hold Augmentin  for now as she most likely  does not need it and try MEDROL  Dosepak. If symptoms don't improve within another 5 days then start the Augmentin . Continue to use albuterol  inhaler as needed.  Promethazine  PRN for cough. Get plenty of rest, hydrate and return to office if symptoms do not resolve with treatment Can continue with dayquil and nyquil  Halsey Hammen, PA-C

## 2023-10-24 NOTE — Patient Instructions (Signed)

## 2023-10-28 ENCOUNTER — Encounter: Payer: Self-pay | Admitting: Physician Assistant

## 2023-10-28 DIAGNOSIS — F411 Generalized anxiety disorder: Secondary | ICD-10-CM

## 2023-10-28 DIAGNOSIS — F4322 Adjustment disorder with anxiety: Secondary | ICD-10-CM

## 2023-10-30 MED ORDER — CLONAZEPAM 0.5 MG PO TABS: 0.5000 mg | ORAL_TABLET | Freq: Two times a day (BID) | ORAL | 0 refills | Status: DC | PRN

## 2023-11-21 ENCOUNTER — Ambulatory Visit: Payer: Medicaid Other | Admitting: Physician Assistant

## 2023-12-22 ENCOUNTER — Other Ambulatory Visit: Payer: Self-pay | Admitting: Physician Assistant

## 2023-12-22 DIAGNOSIS — R4586 Emotional lability: Secondary | ICD-10-CM

## 2023-12-25 ENCOUNTER — Encounter: Payer: Self-pay | Admitting: Physician Assistant

## 2023-12-25 ENCOUNTER — Other Ambulatory Visit: Payer: Self-pay | Admitting: Physician Assistant

## 2023-12-25 DIAGNOSIS — R4586 Emotional lability: Secondary | ICD-10-CM

## 2024-01-02 ENCOUNTER — Encounter: Payer: Self-pay | Admitting: Sports Medicine

## 2024-01-05 ENCOUNTER — Encounter: Payer: Self-pay | Admitting: Physician Assistant

## 2024-01-06 ENCOUNTER — Other Ambulatory Visit: Payer: Self-pay | Admitting: Physician Assistant

## 2024-01-06 DIAGNOSIS — F5101 Primary insomnia: Secondary | ICD-10-CM

## 2024-01-08 NOTE — Telephone Encounter (Signed)
 Last fill 10/13/23 for 90 day supply. Last visit 10/24/23 acute visit.

## 2024-01-15 ENCOUNTER — Encounter: Payer: Self-pay | Admitting: Physician Assistant

## 2024-01-15 ENCOUNTER — Ambulatory Visit (INDEPENDENT_AMBULATORY_CARE_PROVIDER_SITE_OTHER): Admitting: Physician Assistant

## 2024-01-15 ENCOUNTER — Ambulatory Visit (INDEPENDENT_AMBULATORY_CARE_PROVIDER_SITE_OTHER)

## 2024-01-15 VITALS — BP 127/84 | HR 74 | Ht 63.0 in | Wt 152.0 lb

## 2024-01-15 DIAGNOSIS — G8929 Other chronic pain: Secondary | ICD-10-CM

## 2024-01-15 DIAGNOSIS — Z79899 Other long term (current) drug therapy: Secondary | ICD-10-CM | POA: Diagnosis not present

## 2024-01-15 DIAGNOSIS — B078 Other viral warts: Secondary | ICD-10-CM | POA: Diagnosis not present

## 2024-01-15 DIAGNOSIS — B079 Viral wart, unspecified: Secondary | ICD-10-CM | POA: Insufficient documentation

## 2024-01-15 DIAGNOSIS — M25511 Pain in right shoulder: Secondary | ICD-10-CM | POA: Diagnosis not present

## 2024-01-15 NOTE — Patient Instructions (Addendum)
 Get xray today. Start exercises. If not improving will get MRI.  Get labs.

## 2024-01-15 NOTE — Progress Notes (Signed)
 Acute Office Visit  Subjective:     Patient ID: [name], female    DOB: March 13, 1976, 48 y.o.   MRN: [id number]  No chief complaint on file.   HPI Patient is in today for worsening right shoulder pain that has been going on since September. She is having trouble picking her her granddaughter without a lot of pain. She does not know of any specific trauma to the shoulder. She is using anti-inflammatories with some relief. She is having trouble with ROM of right shoulder. She has some pain with laying on that shoulder.   She has a wart that is bothering her on left arm. She would like it frozen off.   ROS See HPI.      Objective:    BP 127/84   Pulse 74   Ht 5' 3 (1.6 m)   Wt 152 lb (68.9 kg)   LMP 05/10/2015 (LMP Unknown)   SpO2 99%   BMI 26.93 kg/m  BP Readings from Last 3 Encounters:  01/15/24 127/84  10/24/23 118/78  10/10/23 (!) 136/90   Wt Readings from Last 3 Encounters:  01/15/24 152 lb (68.9 kg)  10/24/23 146 lb (66.2 kg)  10/10/23 146 lb (66.2 kg)    Cryotherapy Procedure Note  Pre-operative Diagnosis: wart  Post-operative Diagnosis: wart  Locations: left arm below elbow  Indications: irritation  Procedure Details  History of allergy to iodine: no. Pacemaker? no.  Patient informed of risks (permanent scarring, infection, light or dark discoloration, bleeding, infection, weakness, numbness and recurrence of the lesion) and benefits of the procedure and verbal informed consent obtained.  The areas are treated with liquid nitrogen therapy, frozen until ice ball extended 2 mm beyond lesion, allowed to thaw, and treated again. The patient tolerated procedure well.  The patient was instructed on post-op care, warned that there may be blister formation, redness and pain. Recommend OTC analgesia as needed for pain.  Condition: Stable  Complications: none.  Plan: 1. Instructed to keep the area dry and covered for 24-48h and clean  thereafter. 2. Warning signs of infection were reviewed.    Shoulder Injection Procedure Note  Pre-operative Diagnosis: right shoulder pain/likely bursitis  Post-operative Diagnosis: right shoulder pain/likely bursitis  Indications: pain  Procedure Details   Verbal consent was obtained for the procedure. The shoulder was prepped with iodine and the skin was anesthetized. Using a 22 gauge needle the glenohumeral joint is injected with 9 mL 1% lidocaine  and 1 mL of depo medrol  40 under the posterior aspect of the acromion. The injection site was cleansed with topical isopropyl alcohol  and a dressing was applied.  Complications:  None; patient tolerated the procedure well.  Physical Exam Constitutional:      Appearance: Normal appearance.  HENT:     Head: Normocephalic.  Cardiovascular:     Rate and Rhythm: Normal rate and regular rhythm.  Pulmonary:     Effort: Pulmonary effort is normal.  Musculoskeletal:     Comments: Right shoulder:  Decreased ROM due to pain about 120 degrees passive ROM Negative drop arm sign Negative empty can or yeragson Tenderness to palpation over lateral shoulder  Strength 5/5 upper extermity  Skin:    Comments: Common wart of left forearm just below elbow.   Neurological:     Mental Status: She is alert and oriented to person, place, and time.          Assessment & Plan:  SABRASABRADiagnoses and all orders for this visit:  Chronic right shoulder pain -     DG Shoulder Right; Future  Medication management -     CMP14+EGFR  Common wart   Suspect worsening of shoulder pain due to bursitis Get new xray today Start exercises for bursitis Injection given today and discussed to follow up with ortho/sports medicine and/or MRI of shoulder Cmp ordered for medication management Cryotherapy done on wart of left arm   Return if symptoms worsen or fail to improve.  Marketa Midkiff, PA-C

## 2024-01-16 ENCOUNTER — Ambulatory Visit: Payer: Self-pay | Admitting: Physician Assistant

## 2024-01-16 DIAGNOSIS — G8929 Other chronic pain: Secondary | ICD-10-CM

## 2024-01-16 LAB — CMP14+EGFR
ALT: 12 IU/L (ref 0–32)
AST: 13 IU/L (ref 0–40)
Albumin: 4.6 g/dL (ref 3.9–4.9)
Alkaline Phosphatase: 104 IU/L (ref 41–116)
BUN/Creatinine Ratio: 9 (ref 9–23)
BUN: 9 mg/dL (ref 6–24)
Bilirubin Total: 0.4 mg/dL (ref 0.0–1.2)
CO2: 23 mmol/L (ref 20–29)
Calcium: 9.8 mg/dL (ref 8.7–10.2)
Chloride: 101 mmol/L (ref 96–106)
Creatinine, Ser: 0.97 mg/dL (ref 0.57–1.00)
Globulin, Total: 2.4 g/dL (ref 1.5–4.5)
Glucose: 79 mg/dL (ref 70–99)
Potassium: 3.8 mmol/L (ref 3.5–5.2)
Sodium: 139 mmol/L (ref 134–144)
Total Protein: 7 g/dL (ref 6.0–8.5)
eGFR: 72 mL/min/1.73 (ref >59)

## 2024-01-16 NOTE — Progress Notes (Signed)
Kidney, liver, glucose look great.

## 2024-01-17 ENCOUNTER — Other Ambulatory Visit: Payer: Self-pay | Admitting: Physician Assistant

## 2024-01-17 DIAGNOSIS — R4586 Emotional lability: Secondary | ICD-10-CM

## 2024-01-18 ENCOUNTER — Other Ambulatory Visit: Payer: Self-pay | Admitting: Physician Assistant

## 2024-01-18 DIAGNOSIS — R4586 Emotional lability: Secondary | ICD-10-CM

## 2024-01-22 ENCOUNTER — Encounter: Payer: Self-pay | Admitting: Physician Assistant

## 2024-01-22 NOTE — Progress Notes (Signed)
 [name],   How did you do with injection in shoulder? No acute bony abnormalities seen on xray?

## 2024-01-23 ENCOUNTER — Other Ambulatory Visit (HOSPITAL_COMMUNITY): Payer: Self-pay

## 2024-01-23 ENCOUNTER — Other Ambulatory Visit: Payer: Self-pay | Admitting: Physician Assistant

## 2024-01-23 DIAGNOSIS — G2581 Restless legs syndrome: Secondary | ICD-10-CM

## 2024-01-23 MED ORDER — MELOXICAM 15 MG PO TABS
15.0000 mg | ORAL_TABLET | Freq: Every day | ORAL | 2 refills | Status: AC | PRN
  Filled 2024-01-23: qty 30, 30d supply, fill #0

## 2024-01-23 MED ORDER — DIAZEPAM 10 MG PO TABS
10.0000 mg | ORAL_TABLET | Freq: Every day | ORAL | 2 refills | Status: AC
  Filled 2024-01-23: qty 30, 30d supply, fill #0

## 2024-01-23 MED ORDER — MIRABEGRON ER 50 MG PO TB24
50.0000 mg | ORAL_TABLET | Freq: Every day | ORAL | 2 refills | Status: AC
  Filled 2024-01-23: qty 30, 30d supply, fill #0

## 2024-01-25 ENCOUNTER — Ambulatory Visit: Admitting: Sports Medicine

## 2024-02-01 ENCOUNTER — Other Ambulatory Visit (HOSPITAL_COMMUNITY): Payer: Self-pay

## 2024-02-05 ENCOUNTER — Ambulatory Visit: Admitting: Physician Assistant

## 2024-02-12 ENCOUNTER — Other Ambulatory Visit: Payer: Self-pay | Admitting: Physician Assistant

## 2024-02-12 ENCOUNTER — Other Ambulatory Visit: Payer: Self-pay | Admitting: Family Medicine

## 2024-02-12 DIAGNOSIS — J329 Chronic sinusitis, unspecified: Secondary | ICD-10-CM

## 2024-02-12 DIAGNOSIS — T887XXA Unspecified adverse effect of drug or medicament, initial encounter: Secondary | ICD-10-CM

## 2024-02-20 ENCOUNTER — Other Ambulatory Visit: Payer: Self-pay | Admitting: Physician Assistant

## 2024-02-20 ENCOUNTER — Encounter: Payer: Self-pay | Admitting: Physician Assistant

## 2024-02-21 MED ORDER — PREGABALIN 200 MG PO CAPS: 200.0000 mg | ORAL_CAPSULE | Freq: Three times a day (TID) | ORAL | 1 refills | Status: AC

## 2024-03-25 ENCOUNTER — Encounter: Payer: Self-pay | Admitting: Physician Assistant

## 2024-03-25 DIAGNOSIS — F5101 Primary insomnia: Secondary | ICD-10-CM

## 2024-03-25 MED ORDER — ZOLPIDEM TARTRATE 10 MG PO TABS: 10.0000 mg | ORAL_TABLET | Freq: Every day | ORAL | 0 refills | Status: AC

## 2024-03-31 ENCOUNTER — Other Ambulatory Visit: Payer: Self-pay | Admitting: Physician Assistant

## 2024-03-31 ENCOUNTER — Other Ambulatory Visit: Payer: Self-pay | Admitting: Family Medicine

## 2024-03-31 DIAGNOSIS — K219 Gastro-esophageal reflux disease without esophagitis: Secondary | ICD-10-CM

## 2024-03-31 DIAGNOSIS — T887XXA Unspecified adverse effect of drug or medicament, initial encounter: Secondary | ICD-10-CM

## 2024-05-11 ENCOUNTER — Encounter: Payer: Self-pay | Admitting: Physician Assistant

## 2024-05-11 DIAGNOSIS — F411 Generalized anxiety disorder: Secondary | ICD-10-CM

## 2024-05-11 DIAGNOSIS — F4322 Adjustment disorder with anxiety: Secondary | ICD-10-CM

## 2024-05-14 MED ORDER — CLONAZEPAM 0.5 MG PO TABS: 0.5000 mg | ORAL_TABLET | Freq: Two times a day (BID) | ORAL | 0 refills | Status: AC | PRN

## 2024-06-06 ENCOUNTER — Encounter: Payer: Self-pay | Admitting: Physician Assistant

## 2024-06-07 ENCOUNTER — Other Ambulatory Visit: Payer: Self-pay

## 2024-06-07 MED ORDER — SCOPOLAMINE 1 MG/3DAYS TD PT72: 1.0000 | MEDICATED_PATCH | TRANSDERMAL | 1 refills | Status: AC

## 2024-07-03 ENCOUNTER — Ambulatory Visit: Admitting: Rehabilitative and Restorative Service Providers"
